# Patient Record
Sex: Male | Born: 1955 | Race: White | Hispanic: No | Marital: Married | Smoking: Never smoker
Health system: Southern US, Community
[De-identification: ages and names within clinical notes are randomized; demographics above are authoritative.]

## PROBLEM LIST (undated history)

## (undated) ENCOUNTER — Ambulatory Visit

## (undated) DIAGNOSIS — I10 Essential (primary) hypertension: Secondary | ICD-10-CM

## (undated) DIAGNOSIS — M109 Gout, unspecified: Secondary | ICD-10-CM

## (undated) DIAGNOSIS — Z944 Liver transplant status: Secondary | ICD-10-CM

## (undated) DIAGNOSIS — M1990 Unspecified osteoarthritis, unspecified site: Secondary | ICD-10-CM

## (undated) DIAGNOSIS — J189 Pneumonia, unspecified organism: Secondary | ICD-10-CM

## (undated) DIAGNOSIS — Z9289 Personal history of other medical treatment: Secondary | ICD-10-CM

## (undated) DIAGNOSIS — R634 Abnormal weight loss: Secondary | ICD-10-CM

## (undated) DIAGNOSIS — Z951 Presence of aortocoronary bypass graft: Secondary | ICD-10-CM

## (undated) HISTORY — PX: LIVER TRANSPLANT: SHX410

## (undated) HISTORY — PX: KNEE SURGERY: SHX244

## (undated) HISTORY — PX: BACK SURGERY: SHX140

---

## 2000-01-10 ENCOUNTER — Encounter: Admission: RE | Admit: 2000-01-10 | Discharge: 2000-01-10 | Payer: Self-pay | Admitting: Family Medicine

## 2000-01-10 ENCOUNTER — Encounter: Payer: Self-pay | Admitting: Family Medicine

## 2000-12-27 ENCOUNTER — Emergency Department (HOSPITAL_COMMUNITY): Admission: EM | Admit: 2000-12-27 | Discharge: 2000-12-28 | Payer: Self-pay

## 2001-05-31 ENCOUNTER — Ambulatory Visit (HOSPITAL_BASED_OUTPATIENT_CLINIC_OR_DEPARTMENT_OTHER): Admission: RE | Admit: 2001-05-31 | Discharge: 2001-05-31 | Payer: Self-pay | Admitting: Family Medicine

## 2001-05-31 ENCOUNTER — Encounter: Payer: Self-pay | Admitting: Family Medicine

## 2001-05-31 ENCOUNTER — Encounter: Admission: RE | Admit: 2001-05-31 | Discharge: 2001-05-31 | Payer: Self-pay | Admitting: Family Medicine

## 2004-08-26 ENCOUNTER — Encounter: Admission: RE | Admit: 2004-08-26 | Discharge: 2004-11-24 | Payer: Self-pay | Admitting: Family Medicine

## 2005-01-02 ENCOUNTER — Encounter: Admission: RE | Admit: 2005-01-02 | Discharge: 2005-01-02 | Payer: Self-pay | Admitting: Family Medicine

## 2005-08-04 ENCOUNTER — Encounter: Admission: RE | Admit: 2005-08-04 | Discharge: 2005-08-04 | Payer: Self-pay | Admitting: Family Medicine

## 2006-09-21 ENCOUNTER — Encounter: Admission: RE | Admit: 2006-09-21 | Discharge: 2006-09-21 | Payer: Self-pay | Admitting: Family Medicine

## 2008-01-26 ENCOUNTER — Encounter: Admission: RE | Admit: 2008-01-26 | Discharge: 2008-01-26 | Payer: Self-pay | Admitting: Family Medicine

## 2009-05-12 DIAGNOSIS — R634 Abnormal weight loss: Secondary | ICD-10-CM

## 2009-05-12 HISTORY — DX: Abnormal weight loss: R63.4

## 2011-10-26 ENCOUNTER — Emergency Department (INDEPENDENT_AMBULATORY_CARE_PROVIDER_SITE_OTHER)
Admission: EM | Admit: 2011-10-26 | Discharge: 2011-10-26 | Disposition: A | Discharge status: Home / Self Care | Payer: 59 | Source: Home / Self Care | Attending: Emergency Medicine | Admitting: Emergency Medicine

## 2011-10-26 ENCOUNTER — Encounter (HOSPITAL_COMMUNITY): Payer: Self-pay

## 2011-10-26 ENCOUNTER — Emergency Department (HOSPITAL_COMMUNITY)
Admission: EM | Admit: 2011-10-26 | Discharge: 2011-10-26 | Disposition: A | Discharge status: Home / Self Care | Payer: 59 | Attending: Emergency Medicine | Admitting: Emergency Medicine

## 2011-10-26 ENCOUNTER — Emergency Department (HOSPITAL_COMMUNITY): Payer: 59

## 2011-10-26 ENCOUNTER — Encounter (HOSPITAL_COMMUNITY): Payer: Self-pay | Admitting: Adult Health

## 2011-10-26 DIAGNOSIS — R51 Headache: Secondary | ICD-10-CM

## 2011-10-26 DIAGNOSIS — E119 Type 2 diabetes mellitus without complications: Secondary | ICD-10-CM | POA: Insufficient documentation

## 2011-10-26 DIAGNOSIS — J3489 Other specified disorders of nose and nasal sinuses: Secondary | ICD-10-CM | POA: Insufficient documentation

## 2011-10-26 DIAGNOSIS — I10 Essential (primary) hypertension: Secondary | ICD-10-CM | POA: Insufficient documentation

## 2011-10-26 HISTORY — DX: Essential (primary) hypertension: I10

## 2011-10-26 HISTORY — DX: Unspecified osteoarthritis, unspecified site: M19.90

## 2011-10-26 HISTORY — DX: Gout, unspecified: M10.9

## 2011-10-26 LAB — POCT I-STAT, CHEM 8
BUN: 7 mg/dL (ref 6–23)
Calcium, Ion: 1.16 mmol/L (ref 1.12–1.32)
Chloride: 101 mEq/L (ref 96–112)
Creatinine, Ser: 0.8 mg/dL (ref 0.50–1.35)
Glucose, Bld: 116 mg/dL — ABNORMAL HIGH (ref 70–99)
HCT: 40 % (ref 39.0–52.0)
Hemoglobin: 13.6 g/dL (ref 13.0–17.0)
Potassium: 3.4 mEq/L — ABNORMAL LOW (ref 3.5–5.1)
Sodium: 140 mEq/L (ref 135–145)
TCO2: 25 mmol/L (ref 0–100)

## 2011-10-26 MED ORDER — SODIUM CHLORIDE 0.9 % IV BOLUS (SEPSIS)
1000.0000 mL | Freq: Once | INTRAVENOUS | Status: AC
  Administered 2011-10-26: 1000 mL via INTRAVENOUS

## 2011-10-26 MED ORDER — ONDANSETRON HCL 8 MG PO TABS: 8.0000 mg | ORAL_TABLET | ORAL | Status: AC | PRN

## 2011-10-26 MED ORDER — OXYCODONE-ACETAMINOPHEN 5-325 MG PO TABS: 1.0000 | ORAL_TABLET | Freq: Four times a day (QID) | ORAL | Status: AC | PRN

## 2011-10-26 MED ORDER — KETOROLAC TROMETHAMINE 30 MG/ML IJ SOLN
30.0000 mg | Freq: Once | INTRAMUSCULAR | Status: AC
  Administered 2011-10-26: 30 mg via INTRAVENOUS
  Filled 2011-10-26: qty 1

## 2011-10-26 MED ORDER — METOCLOPRAMIDE HCL 5 MG/ML IJ SOLN
10.0000 mg | Freq: Once | INTRAMUSCULAR | Status: AC
  Administered 2011-10-26: 10 mg via INTRAVENOUS
  Filled 2011-10-26: qty 2

## 2011-10-26 MED ORDER — DIPHENHYDRAMINE HCL 50 MG/ML IJ SOLN
25.0000 mg | Freq: Once | INTRAMUSCULAR | Status: AC
  Administered 2011-10-26: 25 mg via INTRAVENOUS
  Filled 2011-10-26: qty 1

## 2011-10-26 NOTE — ED Notes (Signed)
Reports sudden onset h/a to left side forehead at 1300. "Worst h/a of my life". States does not normally get h/a or migraines. +photophobia, nausea, no emesis. Also c/o hot & cold chills

## 2011-10-26 NOTE — Discharge Instructions (Signed)
Increase fluids. Medication for nausea and pain. Rest. Follow up with Dr.

## 2011-10-26 NOTE — ED Notes (Addendum)
Pt has lt sided frontal headache that started two hours ago while watching tv, he has had sinus symptoms this week and woke this am feeling fine.  States this is the worst headache he has ever had.  Denies n/v and no changes in loc

## 2011-10-26 NOTE — ED Provider Notes (Signed)
History     CSN: [id number]  Arrival date & time 10/26/11  64   First MD Initiated Contact with Patient 10/26/11 1628      Chief Complaint  Patient presents with  . Headache    (Consider location/radiation/quality/duration/timing/severity/associated sxs/prior treatment) Patient is a 56 y.o. male presenting with headaches. The history is provided by the patient. No language interpreter was used.  Headache The primary symptoms include headaches. The symptoms began 1 to 2 hours ago. The symptoms are worsening.  Pt complains of having a headache start about 2 hours ago.  Pt has not had previous headaches.  Pt reports he has never had a headache like this.   Pt has a history of hypertension.    Past Medical History  Diagnosis Date  . Hypertension   . Diabetes mellitus   . Arthritis   . Gout     History reviewed. No pertinent past surgical history.  History reviewed. No pertinent family history.  History  Substance Use Topics  . Smoking status: Never Smoker   . Smokeless tobacco: Not on file  . Alcohol Use: No      Review of Systems  Neurological: Positive for headaches.  All other systems reviewed and are negative.    Allergies  Review of patient's allergies indicates no known allergies.  Home Medications   Current Outpatient Rx  Name Route Sig Dispense Refill  . ALLOPURINOL 100 MG PO TABS Oral Take 100 mg by mouth daily.    Marland Kitchen AMLODIPINE BESY-BENAZEPRIL HCL 10-20 MG PO CAPS Oral Take 1 capsule by mouth daily.    . ASPIRIN 81 MG PO TABS Oral Take 81 mg by mouth daily.    . OMEGA-3 FATTY ACIDS 1000 MG PO CAPS Oral Take 2 g by mouth daily.    Marland Kitchen GEMFIBROZIL 600 MG PO TABS Oral Take 600 mg by mouth 2 (two) times daily before a meal.    . HYDROCHLOROTHIAZIDE 25 MG PO TABS Oral Take 25 mg by mouth daily.    Marland Kitchen METFORMIN HCL 500 MG PO TABS Oral Take 500 mg by mouth 2 (two) times daily with a meal.    . PHENTERMINE HCL 37.5 MG PO CAPS Oral Take 37.5 mg by mouth 2 (two)  times daily.    . SERTRALINE HCL 100 MG PO TABS Oral Take 100 mg by mouth daily.    . TESTOSTERONE AQUEOUS IM Intramuscular Inject into the muscle. Started injections last week and takes twice weekly    . TRAZODONE HCL ER PO Oral Take by mouth.      BP 168/91  Pulse 86  Temp 98.4 F (36.9 C) (Oral)  Resp 18  SpO2 97%  Physical Exam  Nursing note and vitals reviewed. Constitutional: He is oriented to person, place, and time. He appears well-developed and well-nourished.  HENT:  Head: Atraumatic.  Mouth/Throat: Oropharynx is clear and moist.  Eyes: Conjunctivae are normal. Pupils are equal, round, and reactive to light.  Neck: Normal range of motion. Neck supple.  Cardiovascular: Normal rate and normal heart sounds.   Abdominal: Soft.  Musculoskeletal: Normal range of motion.  Neurological: He is alert and oriented to person, place, and time. He has normal reflexes.  Skin: Skin is warm.  Psychiatric: He has a normal mood and affect.    ED Course  Procedures (including critical care time)  Labs Reviewed - No data to display No results found.   1. Headache       MDM  Pt advised  of need to go to ED.          [place], [place] 10/26/11 [phone]

## 2011-10-26 NOTE — ED Provider Notes (Signed)
History     CSN: [id number]  Arrival date & time 10/26/11  1648   First MD Initiated Contact with Patient 10/26/11 1715      Chief Complaint  Patient presents with  . Headache    (Consider location/radiation/quality/duration/timing/severity/associated sxs/prior treatment) HPI..... left frontal headache for several hours. Tylenol did not help. Patient typically does not have severe headaches.  No fever, chills, stiff neck, neuro deficits, photophobia.  Nothing makes symptoms better or worse.  Past Medical History  Diagnosis Date  . Hypertension   . Diabetes mellitus   . Arthritis   . Gout     History reviewed. No pertinent past surgical history.  History reviewed. No pertinent family history.  History  Substance Use Topics  . Smoking status: Never Smoker   . Smokeless tobacco: Not on file  . Alcohol Use: No      Review of Systems  Allergies  Niacin and related  Home Medications   Current Outpatient Rx  Name Route Sig Dispense Refill  . ALLOPURINOL 100 MG PO TABS Oral Take 100 mg by mouth daily.    Marland Kitchen AMLODIPINE BESY-BENAZEPRIL HCL 10-20 MG PO CAPS Oral Take 1 capsule by mouth daily.    . ASPIRIN 81 MG PO TABS Oral Take 81 mg by mouth daily.    . OMEGA-3 FATTY ACIDS 1000 MG PO CAPS Oral Take 2 g by mouth daily.    Marland Kitchen GEMFIBROZIL 600 MG PO TABS Oral Take 600 mg by mouth 2 (two) times daily before a meal.    . HYDROCHLOROTHIAZIDE 25 MG PO TABS Oral Take 25 mg by mouth daily.    Marland Kitchen METFORMIN HCL 500 MG PO TABS Oral Take 500 mg by mouth 2 (two) times daily with a meal.    . PHENTERMINE HCL 37.5 MG PO CAPS Oral Take 37.5 mg by mouth 2 (two) times daily.    . SERTRALINE HCL 100 MG PO TABS Oral Take 100 mg by mouth daily.    . TESTOSTERONE AQUEOUS IM Intramuscular Inject into the muscle. Started injections last week and takes twice weekly    . ONDANSETRON HCL 8 MG PO TABS Oral Take 1 tablet (8 mg total) by mouth every 4 (four) hours as needed for nausea. 6 tablet 0  .  OXYCODONE-ACETAMINOPHEN 5-325 MG PO TABS Oral Take 1-2 tablets by mouth every 6 (six) hours as needed for pain. 15 tablet 0    BP 133/74  Pulse 67  Temp 98.4 F (36.9 C) (Oral)  Resp 18  SpO2 97%  Physical Exam  ED Course  Procedures (including critical care time)  Labs Reviewed  POCT I-STAT, CHEM 8 - Abnormal; Notable for the following:    Potassium 3.4 (*)     Glucose, Bld 116 (*)     All other components within normal limits   Ct Head Wo Contrast  10/26/2011  *RADIOLOGY REPORT*  Clinical Data: Left frontal headache since 2 hours ago. Worst headache of life.  CT HEAD WITHOUT CONTRAST  Technique:  Contiguous axial images were obtained from the base of the skull through the vertex without contrast.  Comparison: None.  Findings: Left greater than right ethmoid sinus disease is present with mucosal thickening.  Right sphenoid sinus mucous retention cyst or polyp.  Mastoid air cells clear.  No mass lesion, mass effect, midline shift, hydrocephalus, hemorrhage.  No territorial ischemia or acute infarction.  IMPRESSION:  1.  No acute intracranial abnormality. 2.  Mild mucosal paranasal sinus disease.  Original Report  Authenticated By: Dereck Ligas, M.D.     1. Headache       MDM  IV Toradol, Reglan, Benadryl given.symptoms much improved after medication. No neuro deficits at discharge.      and  Nat Christen, MD 11/05/11 1910

## 2011-10-26 NOTE — ED Notes (Signed)
Sent over by [facility] with c/o "worst headache of entire life" headache came on suddenly and is progessively getting worse denies light and sound sensitivity. Pain is located on left side.  Pain 10/10 associated with nausea, alert and oriented, PERRL

## 2011-10-27 NOTE — ED Provider Notes (Signed)
Medical screening examination/treatment/procedure(s) were performed by non-physician practitioner and as supervising physician I was immediately available for consultation/collaboration.  Cherly Beach MD   Cherly Beach, MD 10/27/11 1028

## 2012-08-15 ENCOUNTER — Emergency Department (HOSPITAL_COMMUNITY)
Admission: EM | Admit: 2012-08-15 | Discharge: 2012-08-15 | Discharge status: Absent without leave | Payer: 59 | Source: Home / Self Care | Attending: Emergency Medicine | Admitting: Emergency Medicine

## 2012-09-01 ENCOUNTER — Other Ambulatory Visit: Payer: 59

## 2012-09-01 ENCOUNTER — Other Ambulatory Visit: Payer: Self-pay | Admitting: Family Medicine

## 2012-09-01 DIAGNOSIS — R109 Unspecified abdominal pain: Secondary | ICD-10-CM

## 2012-09-03 ENCOUNTER — Ambulatory Visit
Admission: RE | Admit: 2012-09-03 | Discharge: 2012-09-03 | Disposition: A | Discharge status: Home / Self Care | Payer: 59 | Source: Ambulatory Visit | Attending: Family Medicine | Admitting: Family Medicine

## 2012-09-03 DIAGNOSIS — R109 Unspecified abdominal pain: Secondary | ICD-10-CM

## 2012-10-05 ENCOUNTER — Ambulatory Visit (INDEPENDENT_AMBULATORY_CARE_PROVIDER_SITE_OTHER): Payer: 59 | Admitting: General Surgery

## 2012-10-05 ENCOUNTER — Encounter (INDEPENDENT_AMBULATORY_CARE_PROVIDER_SITE_OTHER): Payer: Self-pay | Admitting: General Surgery

## 2012-10-05 VITALS — BP 160/70 | HR 84 | Resp 16 | Ht 72.0 in | Wt 272.6 lb

## 2012-10-05 DIAGNOSIS — K828 Other specified diseases of gallbladder: Secondary | ICD-10-CM | POA: Insufficient documentation

## 2012-10-05 NOTE — Progress Notes (Signed)
Subjective:     Patient ID: Randall Frazier, male   DOB: [birth year], 57 y.o.   MRN: [id number]  HPI We are asked to see the patient in consultation by Dr. Doran Stabler to evaluate him for gallstones. The patient is a 57 year old white male who recently had an episode of right upper quadrant pain. He describes the pain as severe. The pain lasted for 4-5 days. The pain was not associated with nausea and vomiting. He had a similar pain to this back in 2004. He underwent an ultrasound which did show sludge in the gallbladder. By report he also had a HIDA scan done which showed a low ejection fraction.  Review of Systems  Constitutional: Negative.   HENT: Negative.   Eyes: Negative.   Respiratory: Negative.   Cardiovascular: Negative.   Gastrointestinal: Positive for abdominal pain.  Endocrine: Negative.   Genitourinary: Negative.   Musculoskeletal: Negative.   Skin: Negative.   Allergic/Immunologic: Negative.   Neurological: Negative.   Hematological: Negative.   Psychiatric/Behavioral: Negative.        Objective:   Physical Exam  Constitutional: He is oriented to person, place, and time. He appears well-developed and well-nourished.  HENT:  Head: Normocephalic and atraumatic.  Eyes: Conjunctivae and EOM are normal. Pupils are equal, round, and reactive to light.  Neck: Normal range of motion. Neck supple.  Cardiovascular: Normal rate, regular rhythm and normal heart sounds.   Pulmonary/Chest: Effort normal and breath sounds normal.  Abdominal: Soft. Bowel sounds are normal. He exhibits no mass. There is no tenderness.  Musculoskeletal: Normal range of motion.  Neurological: He is alert and oriented to person, place, and time.  Skin: Skin is warm and dry.  Psychiatric: He has a normal mood and affect. His behavior is normal.       Assessment:     The patient appears to have biliary colic associated with dyskinesia. Because of the risk of further painful episodes and possible  pancreatitis I think he would benefit from having his gallbladder removed. He would also like to have this done. I've discussed with him in detail the risks and benefits of the operation to remove the gallbladder as well as some of the technical aspects including the risk of injury to the common bile duct and he understands and wishes to proceed     Plan:     Plan for laparoscopic cholecystectomy with intraoperative cholangiogram

## 2012-10-05 NOTE — Patient Instructions (Signed)
Plan for laparoscopic cholecystectomy with intraoperative cholangiogram

## 2012-11-26 ENCOUNTER — Encounter (HOSPITAL_COMMUNITY): Admission: RE | Payer: Self-pay | Source: Ambulatory Visit

## 2012-11-26 ENCOUNTER — Encounter (HOSPITAL_COMMUNITY): Admission: RE | Admit: 2012-11-26 | Payer: 59 | Source: Ambulatory Visit

## 2012-11-26 SURGERY — LAPAROSCOPIC CHOLECYSTECTOMY WITH INTRAOPERATIVE CHOLANGIOGRAM
Anesthesia: General

## 2012-11-29 ENCOUNTER — Encounter (INDEPENDENT_AMBULATORY_CARE_PROVIDER_SITE_OTHER): Payer: 59 | Admitting: General Surgery

## 2012-12-03 ENCOUNTER — Other Ambulatory Visit (HOSPITAL_COMMUNITY): Payer: Self-pay | Admitting: Orthopaedic Surgery

## 2012-12-03 ENCOUNTER — Ambulatory Visit (HOSPITAL_COMMUNITY): Admission: RE | Admit: 2012-12-03 | Payer: 59 | Source: Ambulatory Visit | Admitting: General Surgery

## 2012-12-10 ENCOUNTER — Encounter (HOSPITAL_COMMUNITY): Payer: Self-pay

## 2012-12-13 ENCOUNTER — Encounter (INDEPENDENT_AMBULATORY_CARE_PROVIDER_SITE_OTHER): Payer: 59 | Admitting: General Surgery

## 2012-12-15 ENCOUNTER — Telehealth (INDEPENDENT_AMBULATORY_CARE_PROVIDER_SITE_OTHER): Payer: Self-pay | Admitting: General Surgery

## 2012-12-15 NOTE — Telephone Encounter (Signed)
Called patient about surgery, patient advised having surgery 11 August, 2014 and will call back after recovery of 6-8 weeks to see about surgery for gallbladder.

## 2012-12-16 ENCOUNTER — Encounter (HOSPITAL_COMMUNITY)
Admission: RE | Admit: 2012-12-16 | Discharge: 2012-12-16 | Disposition: A | Discharge status: Home / Self Care | Payer: 59 | Source: Ambulatory Visit | Attending: Orthopaedic Surgery | Admitting: Orthopaedic Surgery

## 2012-12-16 ENCOUNTER — Encounter (HOSPITAL_COMMUNITY): Payer: Self-pay

## 2012-12-16 ENCOUNTER — Ambulatory Visit (HOSPITAL_COMMUNITY)
Admission: RE | Admit: 2012-12-16 | Discharge: 2012-12-16 | Disposition: A | Discharge status: Home / Self Care | Payer: 59 | Source: Ambulatory Visit | Attending: Orthopaedic Surgery | Admitting: Orthopaedic Surgery

## 2012-12-16 DIAGNOSIS — I10 Essential (primary) hypertension: Secondary | ICD-10-CM | POA: Insufficient documentation

## 2012-12-16 DIAGNOSIS — Z01818 Encounter for other preprocedural examination: Secondary | ICD-10-CM | POA: Insufficient documentation

## 2012-12-16 DIAGNOSIS — Z01810 Encounter for preprocedural cardiovascular examination: Secondary | ICD-10-CM | POA: Insufficient documentation

## 2012-12-16 DIAGNOSIS — Z01812 Encounter for preprocedural laboratory examination: Secondary | ICD-10-CM | POA: Insufficient documentation

## 2012-12-16 DIAGNOSIS — M47812 Spondylosis without myelopathy or radiculopathy, cervical region: Secondary | ICD-10-CM | POA: Insufficient documentation

## 2012-12-16 HISTORY — DX: Personal history of other medical treatment: Z92.89

## 2012-12-16 HISTORY — DX: Abnormal weight loss: R63.4

## 2012-12-16 HISTORY — DX: Pneumonia, unspecified organism: J18.9

## 2012-12-16 LAB — COMPREHENSIVE METABOLIC PANEL
ALT: 15 U/L (ref 0–53)
AST: 29 U/L (ref 0–37)
Albumin: 4 g/dL (ref 3.5–5.2)
Alkaline Phosphatase: 52 U/L (ref 39–117)
BUN: 17 mg/dL (ref 6–23)
CO2: 28 mEq/L (ref 19–32)
Calcium: 9.4 mg/dL (ref 8.4–10.5)
Chloride: 99 mEq/L (ref 96–112)
Creatinine, Ser: 1.05 mg/dL (ref 0.50–1.35)
GFR calc Af Amer: 89 mL/min — ABNORMAL LOW (ref >90)
GFR calc non Af Amer: 77 mL/min — ABNORMAL LOW (ref >90)
Glucose, Bld: 108 mg/dL — ABNORMAL HIGH (ref 70–99)
Potassium: 3.9 mEq/L (ref 3.5–5.1)
Sodium: 138 mEq/L (ref 135–145)
Total Bilirubin: 0.7 mg/dL (ref 0.3–1.2)
Total Protein: 7.6 g/dL (ref 6.0–8.3)

## 2012-12-16 LAB — CBC
HCT: 45.9 % (ref 39.0–52.0)
Hemoglobin: 16.6 g/dL (ref 13.0–17.0)
MCH: 31.4 pg (ref 26.0–34.0)
MCHC: 36.2 g/dL — ABNORMAL HIGH (ref 30.0–36.0)
MCV: 86.8 fL (ref 78.0–100.0)
Platelets: 130 10*3/uL — ABNORMAL LOW (ref 150–400)
RBC: 5.29 MIL/uL (ref 4.22–5.81)
RDW: 13.1 % (ref 11.5–15.5)
WBC: 6.7 10*3/uL (ref 4.0–10.5)

## 2012-12-16 LAB — URINALYSIS, ROUTINE W REFLEX MICROSCOPIC
Bilirubin Urine: NEGATIVE
Glucose, UA: NEGATIVE mg/dL
Hgb urine dipstick: NEGATIVE
Ketones, ur: NEGATIVE mg/dL
Leukocytes, UA: NEGATIVE
Nitrite: NEGATIVE
Protein, ur: NEGATIVE mg/dL
Specific Gravity, Urine: 1.022 (ref 1.005–1.030)
Urobilinogen, UA: 1 mg/dL (ref 0.0–1.0)
pH: 6 (ref 5.0–8.0)

## 2012-12-16 LAB — SURGICAL PCR SCREEN
MRSA, PCR: NEGATIVE
Staphylococcus aureus: POSITIVE — AB

## 2012-12-16 NOTE — Pre-Procedure Instructions (Signed)
DAHMIR EPPERLY  12/16/2012   Your procedure is scheduled on:  12/20/2012  Report to [facility] at 10:30 AM.  Call this number if you have problems the morning of surgery: [phone]   Remember:   Do not eat food or drink liquids after midnight. On Sunday    Take these medicines the morning of surgery with A SIP OF WATER: amlodipine    Do not wear jewelry  Do not wear lotions, powders, or perfumes. You may wear deodorant.            Men may shave face and neck.  Do not bring valuables to the hospital.  [facility] is not responsible   for any belongings or valuables.  Contacts, dentures or bridgework may not be worn into surgery.  Leave suitcase in the car. After surgery it may be brought to your room.  For patients admitted to the hospital, checkout time is 11:00 AM the day of  discharge.   Patients discharged the day of surgery will not be allowed to drive  home.  Name and phone number of your driver: with wife  Special Instructions: Shower using CHG 2 nights before surgery and the night before surgery.  If you shower the day of surgery use CHG.  Use special wash - you have one bottle of CHG for all showers.  You should use approximately 1/3 of the bottle for each shower.   Please read over the following fact sheets that you were given: Pain Booklet, Coughing and Deep Breathing, MRSA Information and Surgical Site Infection Prevention

## 2012-12-16 NOTE — Progress Notes (Signed)
Spoke /w Amy at Dr. Lorin Mercy office, informed them that an Rx needs to be called in due to pt. + nasal swab for staph.

## 2012-12-16 NOTE — Progress Notes (Signed)
Unsure when he had EKG last, sees Dr. Marisue Humble for 14 yrs., had stress test 10 + yrs. Ago, never seen by cardiology

## 2012-12-16 NOTE — Progress Notes (Signed)
12/16/12 1603  OBSTRUCTIVE SLEEP APNEA  Have you ever been diagnosed with sleep apnea through a sleep study? No  Do you snore loudly (loud enough to be heard through closed doors)?  0  Do you often feel tired, fatigued, or sleepy during the daytime? 0  Has anyone observed you stop breathing during your sleep? 0  Do you have, or are you being treated for high blood pressure? 1  BMI more than 35 kg/m2? 1  Age over 58 years old? 1  Neck circumference greater than 40 cm/18 inches? 1  Gender: 1  Obstructive Sleep Apnea Score 5  Score 4 or greater  Results sent to PCP

## 2012-12-17 NOTE — H&P (Signed)
[facility]   A Division of [employer]   [address] [place], [place] [address] Telephone: [phone]  Fax: [phone]     PATIENT: Randall Frazier, Randall Frazier   MR#: [id number]  DOB: 29-Mar-1956       A 57 year old male with persistent pain in his neck and into his shoulders.  He has been through conservative treatment with persistent hand numbness and pain for greater than 4 months.  He denies any symptoms in his legs.  He states the pain wakes him up at night when he turns his head.  Pain radiates into his shoulder.  He has been through physical therapy for 2 months, treated with cervical traction, home exercise program, electric stim, postural awareness, strengthening exercises without relief.  He did notice slight improvement in the ability to sleep and not wake up as often after his exercise program.  The patient has taken anti-inflammatories without relief.  He is on metformin, and prednisone pack was given in April, which gave him only temporary relief while he was taking the prednisone.  He has continued to take his allopurinol and Uloric for his history of gout.      MRI scan, 10/26/12, showed cervical spondylitic changes with most significant disease at the C5-6 and C6-7 levels.  The patient has disk osteophyte complex at C5-6 with narrowing of the ventral sac with normal cord signal.  There is moderately severe foraminal stenosis, which is worse on the left, consistent with his symptoms.  Disk osteophyte complex at C6-7 as well with moderate to severe biforaminal narrowing noted.  Mild bulge at C7-T1 and milder changes at C4-5 with foraminal narrowing rated mild to moderate.     The patient was originally referred by Dr. Fredna Dow based on EMG/nerve conduction velocity showing no evidence of peripheral nerve entrapment, upper extremity and cervical spine x-ray showing significant spondylitic changes in the cervical spine with uncovertebral changes, facet  degenerative changes, disk space narrowing, loss of disk space height worse at C6-7 than C5-6 and anterior posterior disk osteophyte complex with spurring.  There was no evidence of congenital bars.      The patient states this has bothered him on a daily basis.  It has been going on for more than 4 months.  Central canal is narrowed down to 9 mm at the C5-6 level with disk osteophyte complex and he has biforaminal stenosis worse on the left than right at C5-6 and severe biforaminal narrowing at C6-7, both right and left. Surgical option would be 2-level cervical fusion.  Overnight stay in the hospital.  Allograft and plate.  Use of the operative microscope was discussed.  Risks of surgery included dysphagia, dysphonia, risk for nonunion, which is less than 10%.  We discussed that if he had a symptomatic pseudoarthrosis, posterior cervical fusion might be needed if he had persistent problems.   Use of postoperative collar, healing time of 6 weeks was discussed.  All questions answered.  He understands and will call if he would like to proceed.    CURRENT MEDICATIONS:  Include metformin, allopurinol, gemfibrozil, vitamin D, and vitamin E.    ALLERGIES:  He is allergic to niacin.     PAST MEDICAL/SURGICAL HISTORY:  Positive for hypertension.  He reported no previous surgeries.  Primary care physician is Dr. Marisue Humble.     SOCIAL HISTORY:  He is married to his wife, Arbie Cookey.  He works as a Art gallery manager.  He does not smoke, rarely drinks.  REVIEW OF SYSTEMS:  Positive for arthritis, diabetes, hypertension and pneumonia.   PHYSICAL EXAMINATION:  The patient is 6-feet tall, 268 pounds.  Alert and oriented.  There is a positive Spurling more on the left than right.  Upper extremity reflexes are 2+.  Biceps, triceps are strong.  There is dorsal hand numbness in the C6 distribution.  Carpal canal with Phalen and Tinel is negative.  Interossei are normal.  No wasting.  Positive Spurling worse on the left than  right.  Distal pedal pulses are normal.  Normal gait.  No lower extremity hyperreflexia.  Lungs are clear to auscultation.  Heart regular rate and rhythm.  Liver and spleen are not palpably enlarged.     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     PLAN:  Anterior cervical discectomy and fusion at C5-6 and C6-7 with allograft,plate and screws.     For additional information please see handwritten notes, reports, orders and prescriptions in this chart.      Mark C. Lorin Mercy, M.D.    Auto-Authenticated by Thana Farr. Lorin Mercy, M.D.

## 2012-12-19 MED ORDER — CHLORHEXIDINE GLUCONATE 4 % EX LIQD: 1.0000 "application " | Freq: Once | CUTANEOUS | Status: DC

## 2012-12-19 MED ORDER — DEXTROSE 5 % IV SOLN
3.0000 g | INTRAVENOUS | Status: AC
  Administered 2012-12-20: 3 g via INTRAVENOUS
  Filled 2012-12-19: qty 3000

## 2012-12-20 ENCOUNTER — Encounter (HOSPITAL_COMMUNITY)
Admission: RE | Disposition: A | Discharge status: Home / Self Care | Payer: Self-pay | Source: Ambulatory Visit | Attending: Orthopaedic Surgery

## 2012-12-20 ENCOUNTER — Ambulatory Visit (HOSPITAL_COMMUNITY): Payer: 59 | Admitting: Certified Registered Nurse Anesthetist

## 2012-12-20 ENCOUNTER — Encounter (HOSPITAL_COMMUNITY): Payer: Self-pay | Admitting: Certified Registered Nurse Anesthetist

## 2012-12-20 ENCOUNTER — Inpatient Hospital Stay (HOSPITAL_COMMUNITY)
Admission: RE | Admit: 2012-12-20 | Discharge: 2012-12-21 | DRG: 473 | Disposition: A | Discharge status: Home / Self Care | Payer: 59 | Source: Ambulatory Visit | Attending: Orthopaedic Surgery | Admitting: Orthopaedic Surgery

## 2012-12-20 ENCOUNTER — Ambulatory Visit (HOSPITAL_COMMUNITY): Payer: 59

## 2012-12-20 ENCOUNTER — Encounter (HOSPITAL_COMMUNITY): Payer: Self-pay | Admitting: *Deleted

## 2012-12-20 DIAGNOSIS — Z79899 Other long term (current) drug therapy: Secondary | ICD-10-CM

## 2012-12-20 DIAGNOSIS — E119 Type 2 diabetes mellitus without complications: Secondary | ICD-10-CM | POA: Diagnosis present

## 2012-12-20 DIAGNOSIS — I10 Essential (primary) hypertension: Secondary | ICD-10-CM | POA: Diagnosis present

## 2012-12-20 DIAGNOSIS — Z7982 Long term (current) use of aspirin: Secondary | ICD-10-CM

## 2012-12-20 DIAGNOSIS — M5380 Other specified dorsopathies, site unspecified: Secondary | ICD-10-CM | POA: Diagnosis present

## 2012-12-20 DIAGNOSIS — M109 Gout, unspecified: Secondary | ICD-10-CM | POA: Diagnosis present

## 2012-12-20 DIAGNOSIS — M47812 Spondylosis without myelopathy or radiculopathy, cervical region: Principal | ICD-10-CM | POA: Diagnosis present

## 2012-12-20 HISTORY — PX: ANTERIOR CERVICAL DECOMP/DISCECTOMY FUSION: SHX1161

## 2012-12-20 LAB — GLUCOSE, CAPILLARY
Glucose-Capillary: 111 mg/dL — ABNORMAL HIGH (ref 70–99)
Glucose-Capillary: 161 mg/dL — ABNORMAL HIGH (ref 70–99)
Glucose-Capillary: 236 mg/dL — ABNORMAL HIGH (ref 70–99)

## 2012-12-20 SURGERY — ANTERIOR CERVICAL DECOMPRESSION/DISCECTOMY FUSION 2 LEVELS
Anesthesia: General | Site: Spine Cervical | Wound class: Clean

## 2012-12-20 MED ORDER — METFORMIN HCL 500 MG PO TABS
500.0000 mg | ORAL_TABLET | Freq: Every day | ORAL | Status: DC
  Administered 2012-12-21: 500 mg via ORAL
  Filled 2012-12-20 (×2): qty 1

## 2012-12-20 MED ORDER — PANTOPRAZOLE SODIUM 40 MG IV SOLR
40.0000 mg | Freq: Every day | INTRAVENOUS | Status: DC
  Administered 2012-12-20: 40 mg via INTRAVENOUS
  Filled 2012-12-20 (×2): qty 40

## 2012-12-20 MED ORDER — AMLODIPINE BESYLATE 5 MG PO TABS
5.0000 mg | ORAL_TABLET | Freq: Every day | ORAL | Status: DC
  Administered 2012-12-21: 5 mg via ORAL
  Filled 2012-12-20 (×2): qty 1

## 2012-12-20 MED ORDER — DOCUSATE SODIUM 100 MG PO CAPS
100.0000 mg | ORAL_CAPSULE | Freq: Two times a day (BID) | ORAL | Status: DC
  Administered 2012-12-20 – 2012-12-21 (×2): 100 mg via ORAL
  Filled 2012-12-20 (×3): qty 1

## 2012-12-20 MED ORDER — OXYCODONE-ACETAMINOPHEN 5-325 MG PO TABS
1.0000 | ORAL_TABLET | ORAL | Status: DC | PRN
  Administered 2012-12-20 – 2012-12-21 (×5): 2 via ORAL
  Filled 2012-12-20 (×4): qty 2

## 2012-12-20 MED ORDER — CEFAZOLIN SODIUM 1-5 GM-% IV SOLN
1.0000 g | Freq: Three times a day (TID) | INTRAVENOUS | Status: AC
  Administered 2012-12-20 – 2012-12-21 (×2): 1 g via INTRAVENOUS
  Filled 2012-12-20 (×2): qty 50

## 2012-12-20 MED ORDER — EPHEDRINE SULFATE 50 MG/ML IJ SOLN
INTRAMUSCULAR | Status: DC | PRN
  Administered 2012-12-20 (×2): 10 mg via INTRAVENOUS

## 2012-12-20 MED ORDER — PROPOFOL 10 MG/ML IV BOLUS
INTRAVENOUS | Status: DC | PRN
  Administered 2012-12-20: 200 mg via INTRAVENOUS

## 2012-12-20 MED ORDER — ACETAMINOPHEN 650 MG RE SUPP: 650.0000 mg | RECTAL | Status: DC | PRN

## 2012-12-20 MED ORDER — OXYCODONE-ACETAMINOPHEN 5-325 MG PO TABS: 1.0000 | ORAL_TABLET | ORAL | Status: DC | PRN

## 2012-12-20 MED ORDER — KETOROLAC TROMETHAMINE 30 MG/ML IJ SOLN
30.0000 mg | Freq: Once | INTRAMUSCULAR | Status: AC
  Administered 2012-12-20: 30 mg via INTRAVENOUS

## 2012-12-20 MED ORDER — METHOCARBAMOL 500 MG PO TABS: 500.0000 mg | ORAL_TABLET | Freq: Four times a day (QID) | ORAL | Status: DC | PRN

## 2012-12-20 MED ORDER — OXYCODONE-ACETAMINOPHEN 5-325 MG PO TABS
ORAL_TABLET | ORAL | Status: AC
  Filled 2012-12-20: qty 2

## 2012-12-20 MED ORDER — MIDAZOLAM HCL 5 MG/5ML IJ SOLN
INTRAMUSCULAR | Status: DC | PRN
  Administered 2012-12-20: 2 mg via INTRAVENOUS

## 2012-12-20 MED ORDER — ACETAMINOPHEN 325 MG PO TABS: 650.0000 mg | ORAL_TABLET | ORAL | Status: DC | PRN

## 2012-12-20 MED ORDER — FENTANYL CITRATE 0.05 MG/ML IJ SOLN
INTRAMUSCULAR | Status: DC | PRN
  Administered 2012-12-20 (×4): 50 ug via INTRAVENOUS
  Administered 2012-12-20: 100 ug via INTRAVENOUS

## 2012-12-20 MED ORDER — 0.9 % SODIUM CHLORIDE (POUR BTL) OPTIME
TOPICAL | Status: DC | PRN
  Administered 2012-12-20: 1000 mL

## 2012-12-20 MED ORDER — FLEET ENEMA 7-19 GM/118ML RE ENEM: 1.0000 | ENEMA | Freq: Once | RECTAL | Status: AC | PRN

## 2012-12-20 MED ORDER — PHENOL 1.4 % MT LIQD: 1.0000 | OROMUCOSAL | Status: DC | PRN

## 2012-12-20 MED ORDER — METHOCARBAMOL 500 MG PO TABS
ORAL_TABLET | ORAL | Status: AC
  Filled 2012-12-20: qty 1

## 2012-12-20 MED ORDER — SODIUM CHLORIDE 0.9 % IJ SOLN: 3.0000 mL | Freq: Two times a day (BID) | INTRAMUSCULAR | Status: DC

## 2012-12-20 MED ORDER — HYDROCODONE-ACETAMINOPHEN 5-325 MG PO TABS: 1.0000 | ORAL_TABLET | ORAL | Status: DC | PRN

## 2012-12-20 MED ORDER — SODIUM CHLORIDE 0.9 % IJ SOLN: 3.0000 mL | INTRAMUSCULAR | Status: DC | PRN

## 2012-12-20 MED ORDER — ONDANSETRON HCL 4 MG/2ML IJ SOLN: 4.0000 mg | INTRAMUSCULAR | Status: DC | PRN

## 2012-12-20 MED ORDER — TRAZODONE HCL 150 MG PO TABS
150.0000 mg | ORAL_TABLET | Freq: Every day | ORAL | Status: DC
  Administered 2012-12-20: 150 mg via ORAL
  Filled 2012-12-20 (×2): qty 1

## 2012-12-20 MED ORDER — LIDOCAINE HCL (CARDIAC) 20 MG/ML IV SOLN
INTRAVENOUS | Status: DC | PRN
  Administered 2012-12-20: 80 mg via INTRAVENOUS

## 2012-12-20 MED ORDER — LIDOCAINE HCL 4 % MT SOLN
OROMUCOSAL | Status: DC | PRN
  Administered 2012-12-20: 4 mL via TOPICAL

## 2012-12-20 MED ORDER — ROCURONIUM BROMIDE 100 MG/10ML IV SOLN
INTRAVENOUS | Status: DC | PRN
  Administered 2012-12-20: 50 mg via INTRAVENOUS

## 2012-12-20 MED ORDER — LACTATED RINGERS IV SOLN
INTRAVENOUS | Status: DC
  Administered 2012-12-20: 50 mL/h via INTRAVENOUS

## 2012-12-20 MED ORDER — ZOLPIDEM TARTRATE 5 MG PO TABS
5.0000 mg | ORAL_TABLET | Freq: Every evening | ORAL | Status: DC | PRN
  Administered 2012-12-20 – 2012-12-21 (×2): 5 mg via ORAL
  Filled 2012-12-20 (×2): qty 1

## 2012-12-20 MED ORDER — SENNOSIDES-DOCUSATE SODIUM 8.6-50 MG PO TABS: 1.0000 | ORAL_TABLET | Freq: Every evening | ORAL | Status: DC | PRN

## 2012-12-20 MED ORDER — HYDROMORPHONE HCL PF 1 MG/ML IJ SOLN
INTRAMUSCULAR | Status: AC
  Filled 2012-12-20: qty 2

## 2012-12-20 MED ORDER — MORPHINE SULFATE 2 MG/ML IJ SOLN
1.0000 mg | INTRAMUSCULAR | Status: DC | PRN
  Administered 2012-12-20 – 2012-12-21 (×3): 4 mg via INTRAVENOUS
  Filled 2012-12-20 (×3): qty 2

## 2012-12-20 MED ORDER — METHOCARBAMOL 100 MG/ML IJ SOLN
500.0000 mg | Freq: Four times a day (QID) | INTRAVENOUS | Status: DC | PRN
  Filled 2012-12-20: qty 5

## 2012-12-20 MED ORDER — MENTHOL 3 MG MT LOZG: 1.0000 | LOZENGE | OROMUCOSAL | Status: DC | PRN

## 2012-12-20 MED ORDER — POTASSIUM CHLORIDE IN NACL 20-0.45 MEQ/L-% IV SOLN
INTRAVENOUS | Status: DC
  Administered 2012-12-20 – 2012-12-21 (×2): via INTRAVENOUS
  Filled 2012-12-20 (×3): qty 1000

## 2012-12-20 MED ORDER — ASPIRIN EC 81 MG PO TBEC
81.0000 mg | DELAYED_RELEASE_TABLET | Freq: Every day | ORAL | Status: DC
  Administered 2012-12-20 – 2012-12-21 (×2): 81 mg via ORAL
  Filled 2012-12-20 (×3): qty 1

## 2012-12-20 MED ORDER — LACTATED RINGERS IV SOLN
INTRAVENOUS | Status: DC | PRN
  Administered 2012-12-20 (×2): via INTRAVENOUS

## 2012-12-20 MED ORDER — METHOCARBAMOL 500 MG PO TABS
500.0000 mg | ORAL_TABLET | Freq: Four times a day (QID) | ORAL | Status: DC | PRN
  Administered 2012-12-20 – 2012-12-21 (×3): 500 mg via ORAL
  Filled 2012-12-20 (×2): qty 1

## 2012-12-20 MED ORDER — KETOROLAC TROMETHAMINE 30 MG/ML IJ SOLN
INTRAMUSCULAR | Status: AC
  Administered 2012-12-20: 30 mg
  Filled 2012-12-20: qty 1

## 2012-12-20 MED ORDER — DEXAMETHASONE SODIUM PHOSPHATE 4 MG/ML IJ SOLN
INTRAMUSCULAR | Status: DC | PRN
  Administered 2012-12-20: 8 mg via INTRAVENOUS

## 2012-12-20 MED ORDER — ATORVASTATIN CALCIUM 20 MG PO TABS
20.0000 mg | ORAL_TABLET | Freq: Every day | ORAL | Status: DC
  Administered 2012-12-20: 20 mg via ORAL
  Filled 2012-12-20 (×2): qty 1

## 2012-12-20 MED ORDER — BUPIVACAINE-EPINEPHRINE 0.25% -1:200000 IJ SOLN
INTRAMUSCULAR | Status: DC | PRN
  Administered 2012-12-20: 5 mL

## 2012-12-20 MED ORDER — GLYCOPYRROLATE 0.2 MG/ML IJ SOLN
INTRAMUSCULAR | Status: DC | PRN
  Administered 2012-12-20 (×2): 0.2 mg via INTRAVENOUS

## 2012-12-20 MED ORDER — INSULIN ASPART 100 UNIT/ML ~~LOC~~ SOLN
0.0000 [IU] | Freq: Three times a day (TID) | SUBCUTANEOUS | Status: DC
  Administered 2012-12-20 – 2012-12-21 (×2): 3 [IU] via SUBCUTANEOUS

## 2012-12-20 MED ORDER — ARTIFICIAL TEARS OP OINT
TOPICAL_OINTMENT | OPHTHALMIC | Status: DC | PRN
  Administered 2012-12-20: 1 via OPHTHALMIC

## 2012-12-20 MED ORDER — ONDANSETRON HCL 4 MG/2ML IJ SOLN
INTRAMUSCULAR | Status: DC | PRN
  Administered 2012-12-20: 4 mg via INTRAVENOUS

## 2012-12-20 MED ORDER — SODIUM CHLORIDE 0.9 % IV SOLN: 250.0000 mL | INTRAVENOUS | Status: DC

## 2012-12-20 MED ORDER — ALLOPURINOL 100 MG PO TABS
100.0000 mg | ORAL_TABLET | Freq: Every day | ORAL | Status: DC
  Administered 2012-12-20: 100 mg via ORAL
  Filled 2012-12-20 (×2): qty 1

## 2012-12-20 MED ORDER — BUPIVACAINE-EPINEPHRINE PF 0.25-1:200000 % IJ SOLN
INTRAMUSCULAR | Status: AC
  Filled 2012-12-20: qty 30

## 2012-12-20 MED ORDER — BISACODYL 10 MG RE SUPP: 10.0000 mg | Freq: Every day | RECTAL | Status: DC | PRN

## 2012-12-20 MED ORDER — NEOSTIGMINE METHYLSULFATE 1 MG/ML IJ SOLN
INTRAMUSCULAR | Status: DC | PRN
  Administered 2012-12-20: 2 mg via INTRAVENOUS

## 2012-12-20 SURGICAL SUPPLY — 58 items
BENZOIN TINCTURE PRP APPL 2/3 (GAUZE/BANDAGES/DRESSINGS) ×2 IMPLANT
BIT DRILL SRG 14X2.2XFLT CHK (BIT) ×1 IMPLANT
BIT DRL SRG 14X2.2XFLT CHK (BIT) ×1
BLADE SURG ROTATE 9660 (MISCELLANEOUS) IMPLANT
BONE CERV LORDOTIC 14.5X12X7 (Bone Implant) ×4 IMPLANT
BUR ROUND FLUTED 4 SOFT TCH (BURR) ×2 IMPLANT
CLOTH BEACON ORANGE TIMEOUT ST (SAFETY) ×2 IMPLANT
CLSR STERI-STRIP ANTIMIC 1/2X4 (GAUZE/BANDAGES/DRESSINGS) ×2 IMPLANT
COLLAR CERV LO CONTOUR FIRM DE (SOFTGOODS) ×2 IMPLANT
CORDS BIPOLAR (ELECTRODE) ×2 IMPLANT
COVER SURGICAL LIGHT HANDLE (MISCELLANEOUS) ×2 IMPLANT
DERMABOND ADVANCED (GAUZE/BANDAGES/DRESSINGS) ×1
DERMABOND ADVANCED .7 DNX12 (GAUZE/BANDAGES/DRESSINGS) ×1 IMPLANT
DRAPE C-ARM 42X72 X-RAY (DRAPES) ×2 IMPLANT
DRAPE MICROSCOPE LEICA (MISCELLANEOUS) ×2 IMPLANT
DRAPE PROXIMA HALF (DRAPES) ×2 IMPLANT
DRILL BIT SKYLINE 14MM (BIT) ×1
DRSG MEPILEX BORDER 4X4 (GAUZE/BANDAGES/DRESSINGS) ×2 IMPLANT
DRSG MEPILEX BORDER 4X8 (GAUZE/BANDAGES/DRESSINGS) ×2 IMPLANT
DURAPREP 6ML APPLICATOR 50/CS (WOUND CARE) ×2 IMPLANT
ELECT COATED BLADE 2.86 ST (ELECTRODE) ×2 IMPLANT
ELECT REM PT RETURN 9FT ADLT (ELECTROSURGICAL) ×2
ELECTRODE REM PT RTRN 9FT ADLT (ELECTROSURGICAL) ×1 IMPLANT
EVACUATOR 1/8 PVC DRAIN (DRAIN) ×2 IMPLANT
GAUZE XEROFORM 1X8 LF (GAUZE/BANDAGES/DRESSINGS) ×2 IMPLANT
GLOVE BIOGEL PI IND STRL 7.5 (GLOVE) ×1 IMPLANT
GLOVE BIOGEL PI IND STRL 8 (GLOVE) ×1 IMPLANT
GLOVE BIOGEL PI INDICATOR 7.5 (GLOVE) ×1
GLOVE BIOGEL PI INDICATOR 8 (GLOVE) ×1
GLOVE ECLIPSE 7.0 STRL STRAW (GLOVE) ×2 IMPLANT
GLOVE ORTHO TXT STRL SZ7.5 (GLOVE) ×2 IMPLANT
GOWN PREVENTION PLUS LG XLONG (DISPOSABLE) ×2 IMPLANT
GOWN PREVENTION PLUS XLARGE (GOWN DISPOSABLE) ×2 IMPLANT
GOWN STRL NON-REIN LRG LVL3 (GOWN DISPOSABLE) ×4 IMPLANT
HEAD HALTER (SOFTGOODS) ×2 IMPLANT
HEMOSTAT SURGICEL 2X14 (HEMOSTASIS) IMPLANT
KIT BASIN OR (CUSTOM PROCEDURE TRAY) ×2 IMPLANT
KIT ROOM TURNOVER OR (KITS) ×2 IMPLANT
MANIFOLD NEPTUNE II (INSTRUMENTS) ×2 IMPLANT
NEEDLE 25GX 5/8IN NON SAFETY (NEEDLE) ×2 IMPLANT
NS IRRIG 1000ML POUR BTL (IV SOLUTION) ×2 IMPLANT
PACK ORTHO CERVICAL (CUSTOM PROCEDURE TRAY) ×2 IMPLANT
PAD ARMBOARD 7.5X6 YLW CONV (MISCELLANEOUS) ×4 IMPLANT
PATTIES SURGICAL .5 X.5 (GAUZE/BANDAGES/DRESSINGS) IMPLANT
PIN FIXATION SKYLINE (Pin) ×2 IMPLANT
PLATE TWO LEVEL SKYLINE 30MM (Plate) ×2 IMPLANT
SCREW VAR SELF TAP SKYLINE 14M (Screw) ×12 IMPLANT
SPONGE GAUZE 4X4 12PLY (GAUZE/BANDAGES/DRESSINGS) ×2 IMPLANT
SURGIFLO TRUKIT (HEMOSTASIS) IMPLANT
SUT VIC AB 2-0 CT1 27 (SUTURE) ×1
SUT VIC AB 2-0 CT1 TAPERPNT 27 (SUTURE) ×1 IMPLANT
SUT VIC AB 3-0 X1 27 (SUTURE) ×2 IMPLANT
SUT VICRYL 4-0 PS2 18IN ABS (SUTURE) ×4 IMPLANT
SYR 30ML SLIP (SYRINGE) ×2 IMPLANT
TAPE CLOTH SURG 4X10 WHT LF (GAUZE/BANDAGES/DRESSINGS) ×2 IMPLANT
TOWEL OR 17X24 6PK STRL BLUE (TOWEL DISPOSABLE) ×2 IMPLANT
TOWEL OR 17X26 10 PK STRL BLUE (TOWEL DISPOSABLE) ×2 IMPLANT
WATER STERILE IRR 1000ML POUR (IV SOLUTION) IMPLANT

## 2012-12-20 NOTE — Anesthesia Postprocedure Evaluation (Signed)
  Anesthesia Post-op Note  Patient: Randall Frazier  Procedure(s) Performed: Procedure(s): C5-6, C6-7 ANTERIOR CERVICAL DISCECTOMY AND FUSION, ALLOGRAFT, PLATE (N/A)  Patient Location: PACU  Anesthesia Type:General  Level of Consciousness: awake  Airway and Oxygen Therapy: Patient Spontanous Breathing  Post-op Pain: mild  Post-op Assessment: Post-op Vital signs reviewed  Post-op Vital Signs: stable  Complications: No apparent anesthesia complications

## 2012-12-20 NOTE — Transfer of Care (Signed)
Immediate Anesthesia Transfer of Care Note  Patient: Randall Frazier  Procedure(s) Performed: Procedure(s): C5-6, C6-7 ANTERIOR CERVICAL DISCECTOMY AND FUSION, ALLOGRAFT, PLATE (N/A)  Patient Location: PACU  Anesthesia Type:General  Level of Consciousness: awake, alert , oriented and patient cooperative  Airway & Oxygen Therapy: Patient Spontanous Breathing and Patient connected to face mask oxygen  Post-op Assessment: Report given to PACU RN, Post -op Vital signs reviewed and stable and Patient moving all extremities  Post vital signs: Reviewed and stable  Complications: No apparent anesthesia complications

## 2012-12-20 NOTE — Interval H&P Note (Signed)
History and Physical Interval Note:  12/20/2012 12:06 PM  Randall Frazier  has presented today for surgery, with the diagnosis of C5-6,C6-7 spondylosis  The various methods of treatment have been discussed with the patient and family. After consideration of risks, benefits and other options for treatment, the patient has consented to  Procedure(s): C5-6, C6-7 ANTERIOR CERVICAL DISCECTOMY AND FUSION, ALLOGRAFT, PLATE (N/A) as a surgical intervention .  The patient's history has been reviewed, patient examined, no change in status, stable for surgery.  I have reviewed the patient's chart and labs.  Questions were answered to the patient's satisfaction.     YATES,MARK C

## 2012-12-20 NOTE — Preoperative (Signed)
Beta Blockers   Reason not to administer Beta Blockers:Not Applicable 

## 2012-12-20 NOTE — Anesthesia Preprocedure Evaluation (Signed)
Anesthesia Evaluation  Patient identified by MRN, date of birth, ID band Patient awake    Reviewed: Allergy & Precautions, H&P , NPO status , Patient's Chart, lab work & pertinent test results, reviewed documented beta blocker date and time   Airway Mallampati: III TM Distance: >3 FB Neck ROM: Full    Dental  (+) Teeth Intact and Dental Advisory Given   Pulmonary    Pulmonary exam normal       Cardiovascular hypertension, Pt. on medications     Neuro/Psych negative neurological ROS  negative psych ROS   GI/Hepatic negative GI ROS, Neg liver ROS,   Endo/Other  diabetes, Well Controlled, Oral Hypoglycemic Agents  Renal/GU negative Renal ROS     Musculoskeletal   Abdominal (+) + obese,   Peds  Hematology negative hematology ROS (+)   Anesthesia Other Findings   Reproductive/Obstetrics                           Anesthesia Physical Anesthesia Plan  ASA: II  Anesthesia Plan: General   Post-op Pain Management:    Induction:   Airway Management Planned: Oral ETT  Additional Equipment:   Intra-op Plan:   Post-operative Plan: Extubation in OR  Informed Consent: I have reviewed the patients History and Physical, chart, labs and discussed the procedure including the risks, benefits and alternatives for the proposed anesthesia with the patient or authorized representative who has indicated his/her understanding and acceptance.   Dental advisory given  Plan Discussed with: CRNA  Anesthesia Plan Comments:         Anesthesia Quick Evaluation

## 2012-12-20 NOTE — Brief Op Note (Cosign Needed)
12/20/2012  2:58 PM  PATIENT:  Randall Frazier  57 y.o. male  PRE-OPERATIVE DIAGNOSIS:  C5-6,C6-7 spondylosis  POST-OPERATIVE DIAGNOSIS:  C5-6,C6-7 spondylosis  PROCEDURE:  Procedure(s): C5-6, C6-7 ANTERIOR CERVICAL DISCECTOMY AND FUSION, ALLOGRAFT, PLATE (N/A)  SURGEON:  Surgeon(s) and Role:    * Marybelle Killings, MD - Primary  PHYSICIAN ASSISTANT: Phillips Hay PAC  ASSISTANTS: none   ANESTHESIA:   general  EBL:  Total I/O In: 1600 [I.V.:1600] Out: 150 [Blood:150]  BLOOD ADMINISTERED:none  DRAINS: (one) Hemovact drain(s) in the anterior neck with  Suction Open   LOCAL MEDICATIONS USED:  MARCAINE     SPECIMEN:  No Specimen  DISPOSITION OF SPECIMEN:  N/A  COUNTS:  YES  TOURNIQUET:  * No tourniquets in log *  DICTATION: .Note written in EPIC  PLAN OF CARE: Admit to inpatient   PATIENT DISPOSITION:  PACU - hemodynamically stable.   Delay start of Pharmacological VTE agent (>24hrs) due to surgical blood loss or risk of bleeding: yes

## 2012-12-21 ENCOUNTER — Encounter (HOSPITAL_COMMUNITY): Payer: Self-pay | Admitting: Orthopaedic Surgery

## 2012-12-21 ENCOUNTER — Encounter (INDEPENDENT_AMBULATORY_CARE_PROVIDER_SITE_OTHER): Payer: 59 | Admitting: General Surgery

## 2012-12-21 LAB — GLUCOSE, CAPILLARY: Glucose-Capillary: 155 mg/dL — ABNORMAL HIGH (ref 70–99)

## 2012-12-21 NOTE — Progress Notes (Signed)
Patient discharged to home accompanied by wife. Discharge instructions and rx given and explained and pt stated understanding. IV was removed and patient left unit in a stable condition via wheelchair.

## 2012-12-21 NOTE — Progress Notes (Signed)
Subjective: 1 Day Post-Op Procedure(s) (LRB): C5-6, C6-7 ANTERIOR CERVICAL DISCECTOMY AND FUSION, ALLOGRAFT, PLATE (N/A) Patient reports pain as mild.    Objective: Vital signs in last 24 hours: Temp:  [97 F (36.1 C)-98.9 F (37.2 C)] 97.3 F (36.3 C) (08/12 0546) Pulse Rate:  [72-105] 98 (08/12 0546) Resp:  [0-20] 18 (08/12 0546) BP: (153-186)/(82-99) 153/88 mmHg (08/12 0546) SpO2:  [91 %-98 %] 98 % (08/12 0546)  Intake/Output from previous day: 08/11 0701 - 08/12 0700 In: 1800 [I.V.:1800] Out: 890 [Drains:40; Blood:150] Intake/Output this shift:    No results found for this basename: HGB,  in the last 72 hours No results found for this basename: WBC, RBC, HCT, PLT,  in the last 72 hours No results found for this basename: NA, K, CL, CO2, BUN, CREATININE, GLUCOSE, CALCIUM,  in the last 72 hours No results found for this basename: LABPT, INR,  in the last 72 hours  Neurovascular intact Incision: drain removed  Assessment/Plan: 1 Day Post-Op Procedure(s) (LRB): C5-6, C6-7 ANTERIOR CERVICAL DISCECTOMY AND FUSION, ALLOGRAFT, PLATE (N/A) D/C IV fluids Discharge home today rx percocet and robaxin on chart. OV 2 weeks  Tangi Shroff M 12/21/2012, 8:42 AM

## 2012-12-21 NOTE — Discharge Summary (Signed)
Physician Discharge Summary  Patient ID: Randall Frazier MRN: [id number] DOB/AGE: [birth year] 57 y.o.  Admit date: 12/20/2012 Discharge date: 12/21/2012  Admission Diagnoses:  Cervical spondylosis without myelopathy C5-6 and C6-7 with foraminal stenosis  Discharge Diagnoses:  Principal Problem:   Cervical spondylosis without myelopathy C5-6 and C6-7 with foraminal stenosis  Past Medical History  Diagnosis Date  . Hypertension   . Diabetes mellitus   . Gout   . H/O exercise stress test     many yrs. ago, no need for f/u with cardiac   . Pneumonia     while in [employer], [id number]   . Arthritis     shoulder- both, neck, spine, GOUT  . Weight loss 2011    100 lbs. weight loss -over 18 mon. period     Surgeries: Procedure(s): C5-6, C6-7 ANTERIOR CERVICAL DISCECTOMY AND FUSION, ALLOGRAFT, PLATE on 7/91/5056   Consultants (if any):    Discharged Condition: Improved  Hospital Course: Randall Frazier is an 57 y.o. male who was admitted 12/20/2012 with a diagnosis of Cervical spondylosis without myelopathy and went to the operating room on 12/20/2012 and underwent the above named procedures.    He was given perioperative antibiotics:  Anti-infectives   Start     Dose/Rate Route Frequency Ordered Stop   12/20/12 2000  ceFAZolin (ANCEF) IVPB 1 g/50 mL premix     1 g 100 mL/hr over 30 Minutes Intravenous Every 8 hours 12/20/12 1646 12/21/12 0440   12/20/12 0600  ceFAZolin (ANCEF) 3 g in dextrose 5 % 50 mL IVPB     3 g 160 mL/hr over 30 Minutes Intravenous On call to O.R. 12/19/12 1335 12/20/12 1249    .  He was given sequential compression devices, early ambulation for DVT prophylaxis.  He benefited maximally from the hospital stay and there were no complications.    Recent vital signs:  Filed Vitals:   12/21/12 0546  BP: 153/88  Pulse: 98  Temp: 97.3 F (36.3 C)  Resp: 18    Recent laboratory studies:  Lab Results  Component Value Date   HGB 16.6 12/16/2012   HGB 13.6  10/26/2011   Lab Results  Component Value Date   WBC 6.7 12/16/2012   PLT 130* 12/16/2012   No results found for this basename: INR   Lab Results  Component Value Date   NA 138 12/16/2012   K 3.9 12/16/2012   CL 99 12/16/2012   CO2 28 12/16/2012   BUN 17 12/16/2012   CREATININE 1.05 12/16/2012   GLUCOSE 108* 12/16/2012    Discharge Medications:     Medication List         acetaminophen 500 MG tablet  Commonly known as:  TYLENOL  Take 1,000 mg by mouth daily as needed for pain.     allopurinol 100 MG tablet  Commonly known as:  ZYLOPRIM  Take 100 mg by mouth at bedtime.     amLODipine 10 MG tablet  Commonly known as:  NORVASC  Take 5 mg by mouth daily after breakfast.     aspirin EC 81 MG tablet  Take 81 mg by mouth daily.     atorvastatin 20 MG tablet  Commonly known as:  LIPITOR  Take 20 mg by mouth at bedtime.     Cholecalciferol 2000 UNITS Tabs  Take 4,000 Units by mouth at bedtime.     fish oil-omega-3 fatty acids 1000 MG capsule  Take 1,000 mg by mouth 2 (two) times daily.  meloxicam 15 MG tablet  Commonly known as:  MOBIC  Take 15 mg by mouth at bedtime.     metFORMIN 500 MG tablet  Commonly known as:  GLUCOPHAGE  Take 500 mg by mouth daily with breakfast.     methocarbamol 500 MG tablet  Commonly known as:  ROBAXIN  Take 1 tablet (500 mg total) by mouth every 6 (six) hours as needed (spasm).     oxyCODONE-acetaminophen 5-325 MG per tablet  Commonly known as:  ROXICET  Take 1-2 tablets by mouth every 4 (four) hours as needed for pain.     phentermine 37.5 MG capsule  Take 37.5 mg by mouth daily as needed (weight loss/energy).     testosterone cypionate 200 MG/ML injection  Commonly known as:  DEPOTESTOTERONE CYPIONATE  Inject 200 mg into the muscle every 28 (twenty-eight) days. Next dose scheduled 8/4     traZODone 150 MG tablet  Commonly known as:  DESYREL  Take 150 mg by mouth at bedtime.        Diagnostic Studies: Dg Chest 2 View  12/16/2012    *RADIOLOGY REPORT*  Clinical Data: Hypertension.  Preoperative for cervical spondylosis  CHEST - 2 VIEW  Comparison: Sep 21, 2006  Findings: There is a stable calcified granuloma in the right upper mid lung.  There is no focal infiltrate, pulmonary edema, or pleural effusion.  The mediastinal contour and cardiac silhouette are normal.  The soft tissues and osseous structures are stable.  IMPRESSION: No acute cardiopulmonary disease identified.   Original Report Authenticated By: Abelardo Diesel, M.D.   Dg Cervical Spine 2-3 Views  12/20/2012   *RADIOLOGY REPORT*  Clinical Data: C5-C7 ACDF.  DG C-ARM 61-120 MIN,CERVICAL SPINE - 2-3 VIEW  Technique: Four intraoperative fluoroscopic spot films.  Comparison:  MRI 10/26/2012.  Findings: Spot films demonstrate C5-C7 ACDF placement. Endotracheal tube and nasogastric tube present.  IMPRESSION: C5-C7 ACDF.   Original Report Authenticated By: Dereck Ligas, M.D.   Dg C-arm [phone] Min  12/20/2012   *RADIOLOGY REPORT*  Clinical Data: C5-C7 ACDF.  DG C-ARM 61-120 MIN,CERVICAL SPINE - 2-3 VIEW  Technique: Four intraoperative fluoroscopic spot films.  Comparison:  MRI 10/26/2012.  Findings: Spot films demonstrate C5-C7 ACDF placement. Endotracheal tube and nasogastric tube present.  IMPRESSION: C5-C7 ACDF.   Original Report Authenticated By: Dereck Ligas, M.D.   DISPOSITION:  HOME No lifting greater than 10 lbs. No overhead use of arms. Avoid bending,and twisting neck. Walk in house for first week them may start to get out slowly increasing distance up to one mile by 3 weeks post op. Keep incision dry for 3 days, may then bathe and wet incision using a covered collar when showering. Call if any fevers >101, chills, or increasing numbness or weakness or increased swelling or drainage.        Follow-up Information   Follow up with Marybelle Killings, MD. Schedule an appointment as soon as possible for a visit in 2 weeks. (or as scheduled)    Contact information:    [place] [address] [phone]        Signed: Epimenio Foot 12/21/2012, 8:44 AM

## 2012-12-21 NOTE — Op Note (Signed)
NAMEJOBIN, MONTELONGO NO.:  [id number]  MEDICAL RECORD NO.:  [id number]  LOCATION:  5N23C                        FACILITY:  [facility]  PHYSICIAN:  Seila Liston C. Lorin Mercy, M.D.    DATE OF BIRTH:  04/30/1956  DATE OF PROCEDURE:  12/20/2012 DATE OF DISCHARGE:                              OPERATIVE REPORT   PREOPERATIVE DIAGNOSIS:  Spondylosis with foraminal stenosis. C5-6, C6- 7.  POSTOPERATIVE DIAGNOSIS:  Spondylosis with foraminal stenosis. C5-6, C6- 7.  PROCEDURE:  C5-6, C6-7 anterior cervical diskectomy, and plate, allograft, 7 mm cortical cancellous at both levels.  DePuy locking plate.  SURGEON:  Jaylee Lantry C. Lorin Mercy, M.D.  ASSISTANT:  Epimenio Foot, PA-C, medically necessary and present for the entire procedure.  ANESTHESIA:  GOT plus 5 mL Marcaine local.  ESTIMATED BLOOD LOSS:  Less than 100 mL.  COMPLICATIONS:  None.  DESCRIPTION OF PROCEDURE:  After induction of general anesthesia, orotracheal intubation, the head halter traction was applied.  Arms were tucked at the sides with pads for the ulnar nerve.  Neck was prepped with DuraPrep.  It was drying.  Time-out procedure was completed.  The area was squared with towels, sterile skin marker in the prominent skin fold overlying palpable landmarks for appropriate exposure for both the involved surgical levels.  Betadine and Steri-Drape application, sterile Mayo stand at the head and thyroid sheets and drapes.  Time-out procedure was completed.  Incision was started at the midline extending to the left.  Platysma was divided in line with the skin incisions. Prominent vein was isolated and double tied off with 2-0 Vicryl.  The bipolar cauterized.  Blunt dissection with carotid sheath and contents lateral.  Longus coli was encountered.  Prominent spur at C5-6 was noted as well as the lesser spur at C6-7.  Short 25 needle, straight clamp was placed in the C5-6 level confirmed with lateral C-arm visualization  that this was correct level. C5-6 was tighter than was first operative level and self-retaining Cloward retractors were placed right and left with teeth blades smooth blades up and down.  Anterior spurs removed with a Beyer rongeur and using the operative microscope, diskectomy was performed using combination Cloward scalpel 4 mm tooth bur and a 1 and 2 mm Kerrisons.  Posterior longitudinal ligament was taken down.  Spur was removed uncovertebral both right and left.  There was considerable amount of disk material, in combination with the hard and soft protrusion causing narrowing.  Once the dura was exposed to decompress right and left in the lateral gutters.  Endplates were rasped and smoothed and curetted and a small file was used.  Trial sizers showed a 7 gave a nice tight fit, head halter traction was applied by the CRNA.  Graft was impacted, countersunk 2 mm.  Identical procedure was repeated at the C6-7 level. This had tighter disk space more overhanging spurs and slightly more shingling posteriorly.  Little bit more endplate was taken inferiorly on the C7 vertebrae to allow exposure posteriorly at the level of the disk space for decompression of disk material and spurs off the dura.  Dura was well visualized right and left.  Once it was well decompressed. Graft  was also fitted, countersunk and then anterior spurs were removed with a Melrose Nakayama and pituitary so the plate would sit flat.  Skyline DePuy plate was selected, placed, 30 mm small prong holder was placed, checked under fluoroscopy, AP and lateral.  Split slightly more to the midline, since it was slightly centered to the left.  Screws were in the bottom third of C5-6 and the middle of C6-7 and in safe position on C7.  All screws were hand drilled, inserted 14 mm screws, and then after checking under fluoroscopy,  final pictures, screws were locked in with tiny locking screw driver at the adjacent screw head, locking each  screw. Copious irrigation.  Operative field was dry.  Hemovac was placed in and out technique, 3-0 Vicryl placed in the platysma, 4-0 Vicryl subcuticular closure.  Tincture of benzoin, Steri-Strips, Marcaine infiltration, 4x4s, tape and soft cervical collar.  The patient tolerated the procedure well.  He will stay overnight for IV pain medication.     Ceyda Peterka C. Lorin Mercy, M.D.     MCY/MEDQ  D:  12/20/2012  T:  12/21/2012  Job:  [id number]

## 2013-03-17 ENCOUNTER — Other Ambulatory Visit: Payer: Self-pay

## 2014-04-29 IMAGING — CT CT HEAD W/O CM
1 series · 16 of 30 positions shown, 20 images · non-contrast
Comparison: None.

CLINICAL DATA: Left frontal headache since 2 hours ago. Worst
headache of life.

CT HEAD WITHOUT CONTRAST
TECHNIQUE: Contiguous axial images were obtained from the base of
the skull through the vertex without contrast.

[Series 2: head routine 4.8 h37s · axial · 0.47mm/px · z∈[-95,+40]mm · 16 of 30 slices shown, 20 images]
[im 2/30  brain]
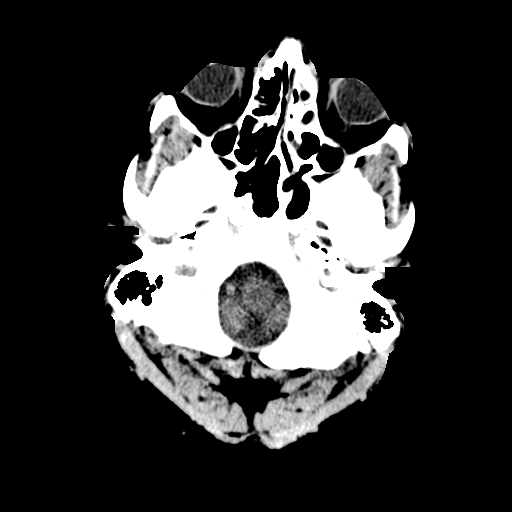
[im 2/30  bone]
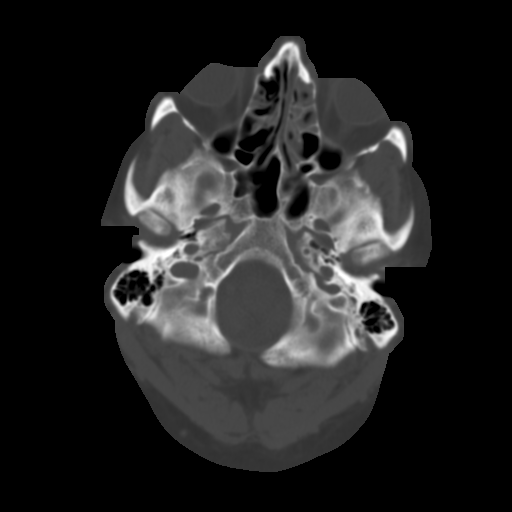
[im 4/30  brain]
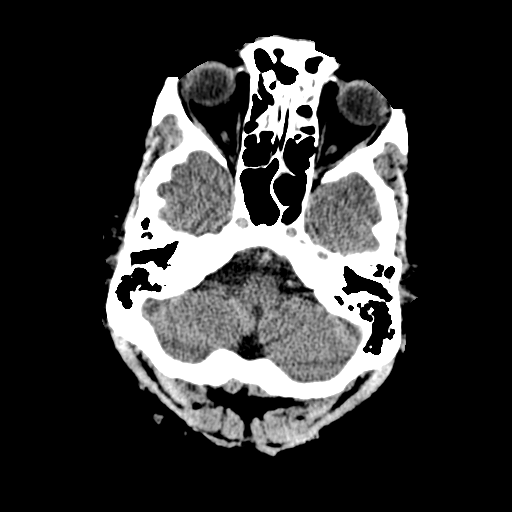
[im 6/30  brain]
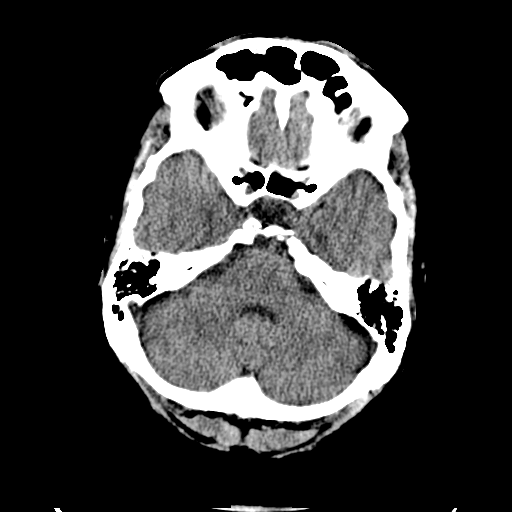
[im 8/30  brain]
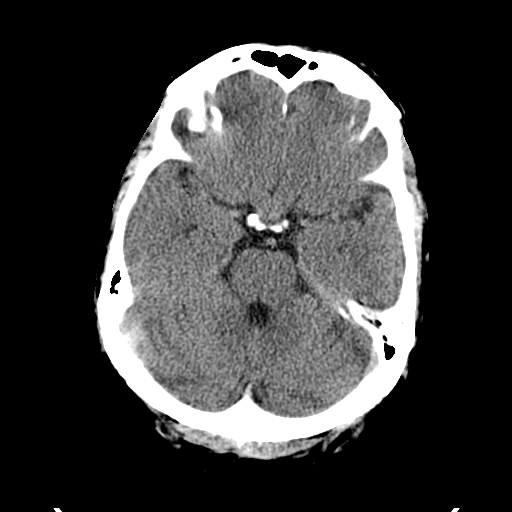
[im 9/30  brain]
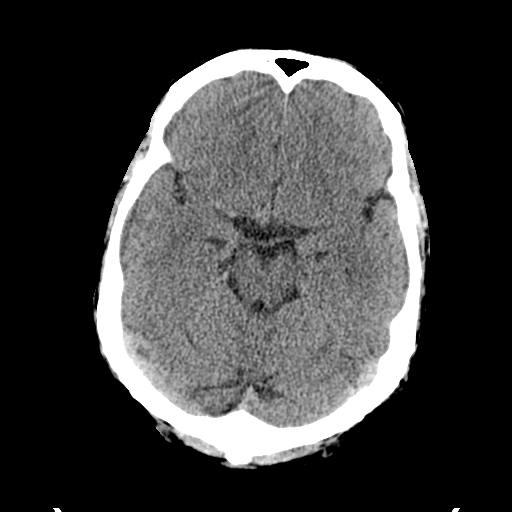
[im 9/30  bone]
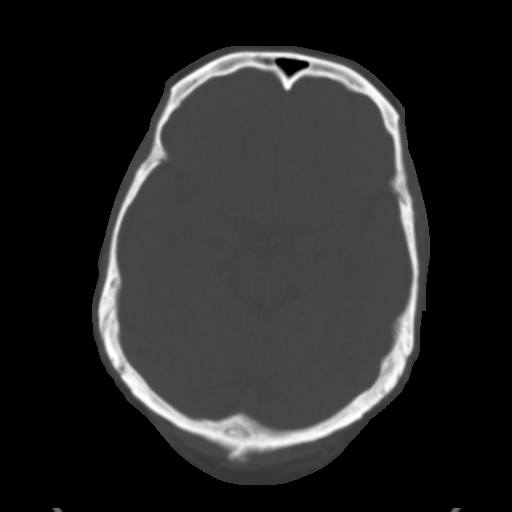
[im 11/30  brain]
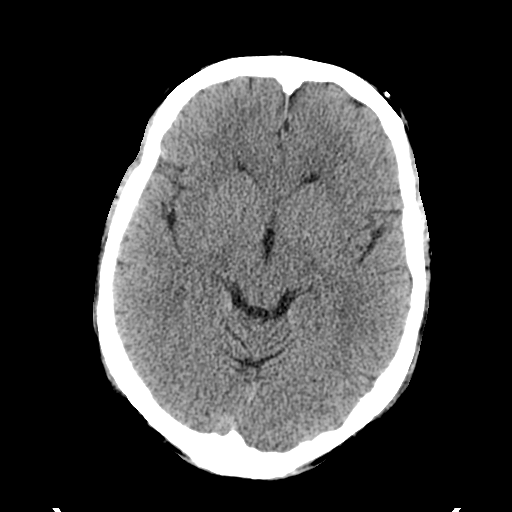
[im 13/30  brain]
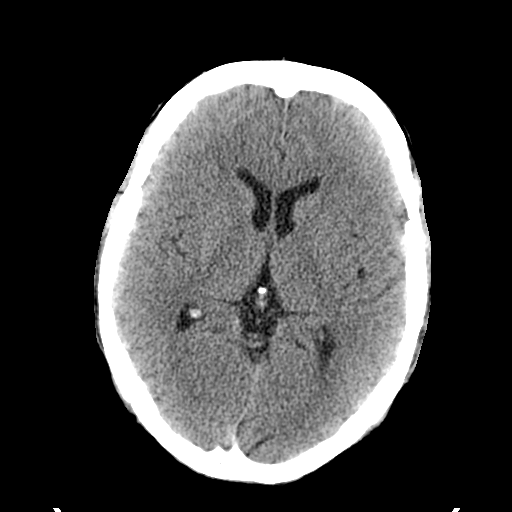
[im 15/30  brain]
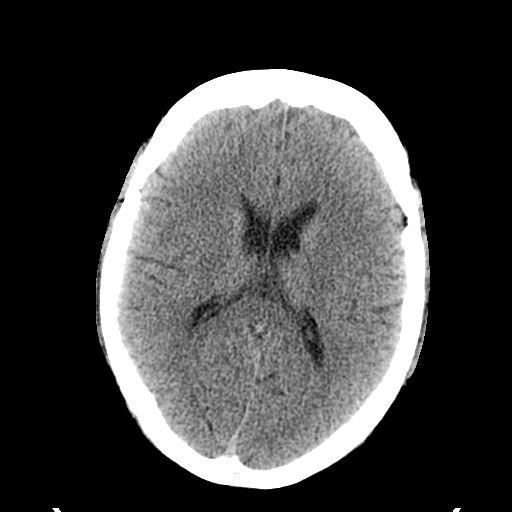
[im 16/30  brain]
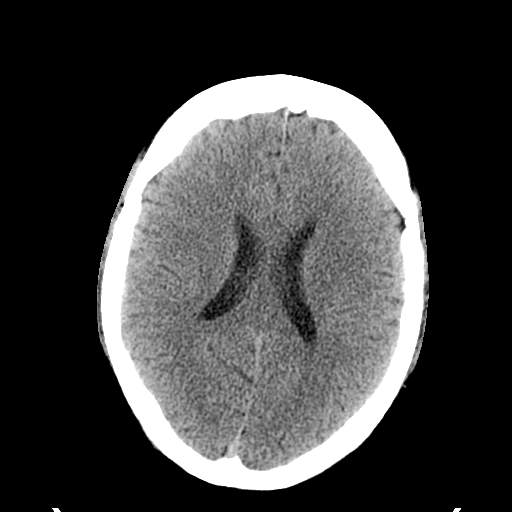
[im 16/30  bone]
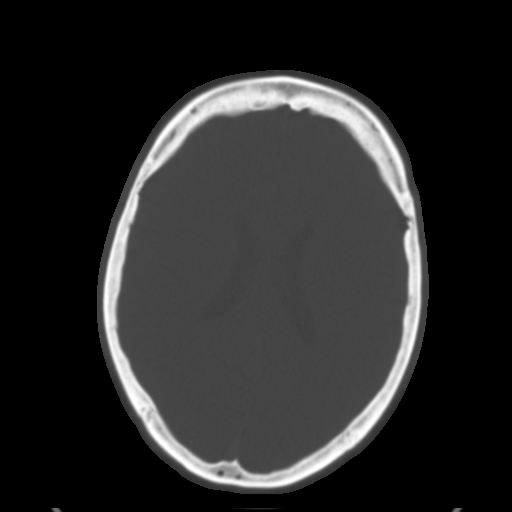
[im 18/30  brain]
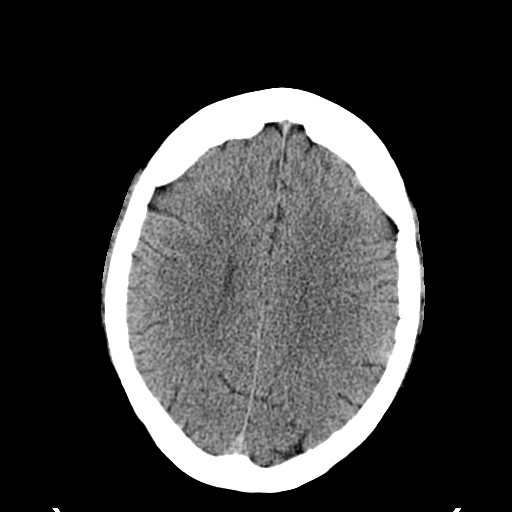
[im 20/30  brain]
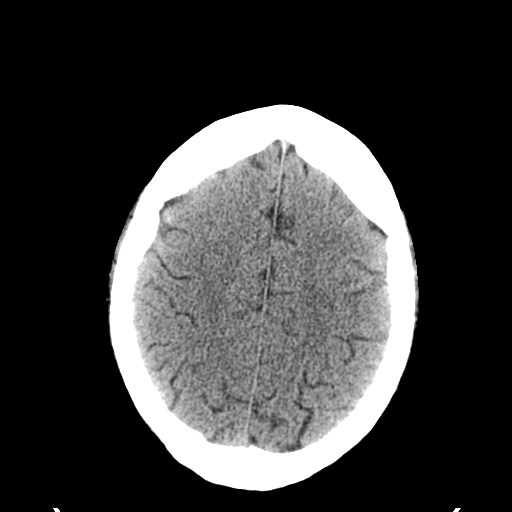
[im 22/30  brain]
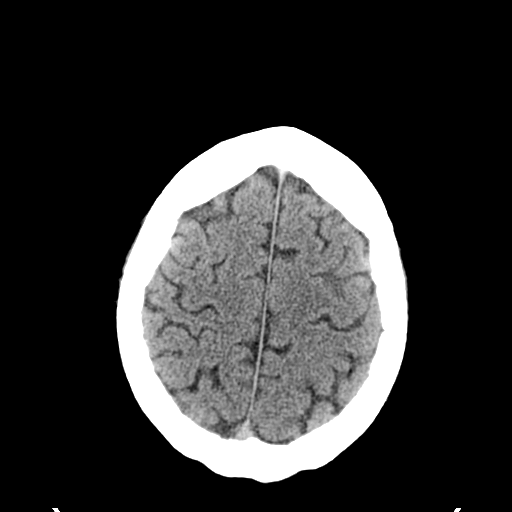
[im 23/30  brain]
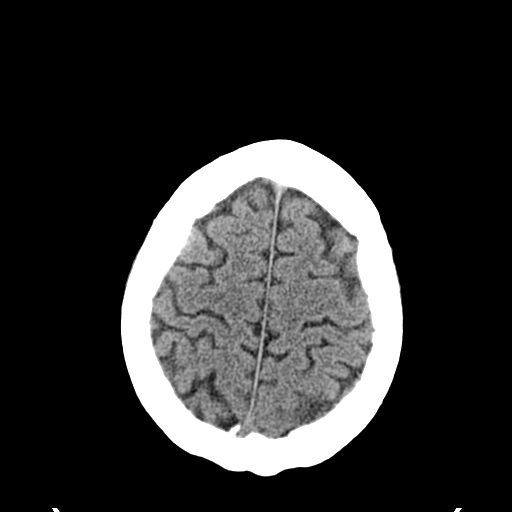
[im 23/30  bone]
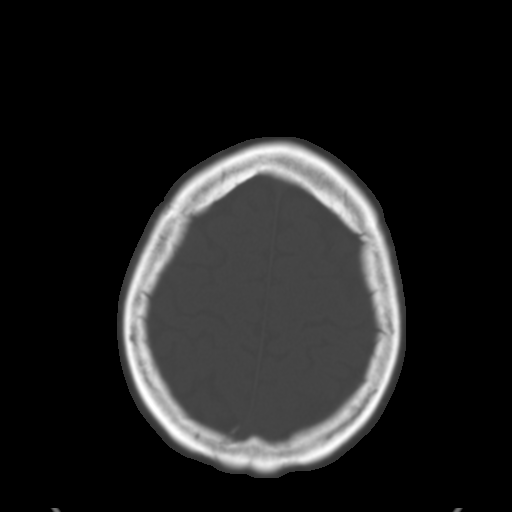
[im 25/30  brain]
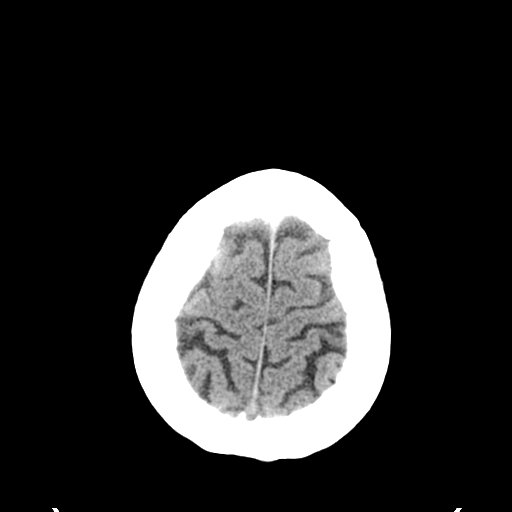
[im 27/30  brain]
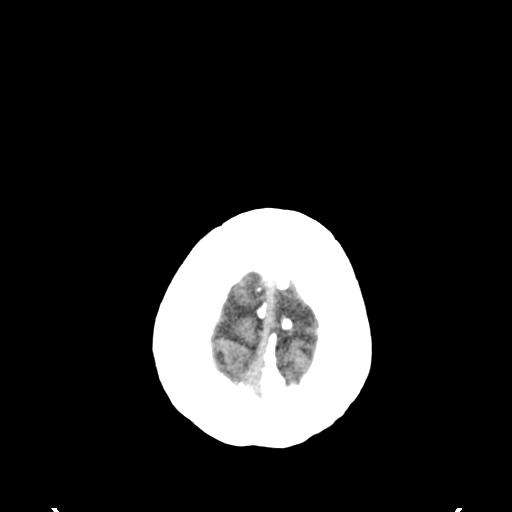
[im 29/30  brain]
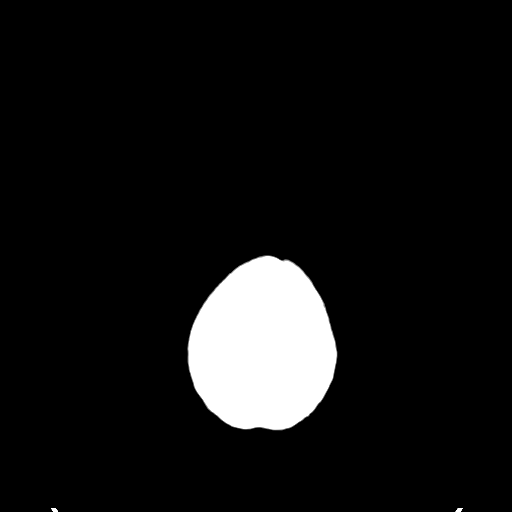

[16 of 30 positions shown; findings below may reference images not displayed]

FINDINGS: Left greater than right ethmoid sinus disease is present
with mucosal thickening.  Right sphenoid sinus mucous retention
cyst or polyp.  Mastoid air cells clear.  No mass lesion, mass
effect, midline shift, hydrocephalus, hemorrhage.  No territorial
ischemia or acute infarction.
IMPRESSION: 1.  No acute intracranial abnormality.
2.  Mild mucosal paranasal sinus disease.

## 2014-09-03 ENCOUNTER — Emergency Department (HOSPITAL_COMMUNITY): Payer: Non-veteran care

## 2014-09-03 ENCOUNTER — Emergency Department (HOSPITAL_COMMUNITY)
Admission: EM | Admit: 2014-09-03 | Discharge: 2014-09-03 | Disposition: A | Discharge status: Home / Self Care | Payer: Non-veteran care | Attending: Emergency Medicine | Admitting: Emergency Medicine

## 2014-09-03 DIAGNOSIS — Z7982 Long term (current) use of aspirin: Secondary | ICD-10-CM | POA: Insufficient documentation

## 2014-09-03 DIAGNOSIS — S0990XA Unspecified injury of head, initial encounter: Secondary | ICD-10-CM | POA: Diagnosis present

## 2014-09-03 DIAGNOSIS — Y9389 Activity, other specified: Secondary | ICD-10-CM | POA: Diagnosis not present

## 2014-09-03 DIAGNOSIS — S060X0A Concussion without loss of consciousness, initial encounter: Secondary | ICD-10-CM | POA: Diagnosis not present

## 2014-09-03 DIAGNOSIS — Z8701 Personal history of pneumonia (recurrent): Secondary | ICD-10-CM | POA: Diagnosis not present

## 2014-09-03 DIAGNOSIS — Y998 Other external cause status: Secondary | ICD-10-CM | POA: Insufficient documentation

## 2014-09-03 DIAGNOSIS — Z79899 Other long term (current) drug therapy: Secondary | ICD-10-CM | POA: Diagnosis not present

## 2014-09-03 DIAGNOSIS — M109 Gout, unspecified: Secondary | ICD-10-CM | POA: Insufficient documentation

## 2014-09-03 DIAGNOSIS — I10 Essential (primary) hypertension: Secondary | ICD-10-CM | POA: Diagnosis not present

## 2014-09-03 DIAGNOSIS — E119 Type 2 diabetes mellitus without complications: Secondary | ICD-10-CM | POA: Insufficient documentation

## 2014-09-03 DIAGNOSIS — M1990 Unspecified osteoarthritis, unspecified site: Secondary | ICD-10-CM | POA: Insufficient documentation

## 2014-09-03 DIAGNOSIS — Y92410 Unspecified street and highway as the place of occurrence of the external cause: Secondary | ICD-10-CM | POA: Insufficient documentation

## 2014-09-03 DIAGNOSIS — Z791 Long term (current) use of non-steroidal anti-inflammatories (NSAID): Secondary | ICD-10-CM | POA: Insufficient documentation

## 2014-09-03 LAB — I-STAT CHEM 8, ED
BUN: 7 mg/dL (ref 6–23)
Calcium, Ion: 1.16 mmol/L (ref 1.12–1.23)
Chloride: 100 mmol/L (ref 96–112)
Creatinine, Ser: 0.9 mg/dL (ref 0.50–1.35)
Glucose, Bld: 127 mg/dL — ABNORMAL HIGH (ref 70–99)
HCT: 39 % (ref 39.0–52.0)
Hemoglobin: 13.3 g/dL (ref 13.0–17.0)
Potassium: 3.4 mmol/L — ABNORMAL LOW (ref 3.5–5.1)
Sodium: 138 mmol/L (ref 135–145)
TCO2: 24 mmol/L (ref 0–100)

## 2014-09-03 LAB — COMPREHENSIVE METABOLIC PANEL
ALT: 44 U/L (ref 0–53)
AST: 60 U/L — ABNORMAL HIGH (ref 0–37)
Albumin: 3.6 g/dL (ref 3.5–5.2)
Alkaline Phosphatase: 49 U/L (ref 39–117)
Anion gap: 8 (ref 5–15)
BUN: 5 mg/dL — ABNORMAL LOW (ref 6–23)
CO2: 26 mmol/L (ref 19–32)
Calcium: 9.1 mg/dL (ref 8.4–10.5)
Chloride: 101 mmol/L (ref 96–112)
Creatinine, Ser: 0.85 mg/dL (ref 0.50–1.35)
GFR calc Af Amer: >90 mL/min (ref >90)
GFR calc non Af Amer: >90 mL/min (ref >90)
Glucose, Bld: 126 mg/dL — ABNORMAL HIGH (ref 70–99)
Potassium: 3.2 mmol/L — ABNORMAL LOW (ref 3.5–5.1)
Sodium: 135 mmol/L (ref 135–145)
Total Bilirubin: 1.1 mg/dL (ref 0.3–1.2)
Total Protein: 6.5 g/dL (ref 6.0–8.3)

## 2014-09-03 LAB — SAMPLE TO BLOOD BANK

## 2014-09-03 LAB — CBC
HCT: 37.6 % — ABNORMAL LOW (ref 39.0–52.0)
Hemoglobin: 13.7 g/dL (ref 13.0–17.0)
MCH: 31.5 pg (ref 26.0–34.0)
MCHC: 36.4 g/dL — ABNORMAL HIGH (ref 30.0–36.0)
MCV: 86.4 fL (ref 78.0–100.0)
Platelets: 108 10*3/uL — ABNORMAL LOW (ref 150–400)
RBC: 4.35 MIL/uL (ref 4.22–5.81)
RDW: 13 % (ref 11.5–15.5)
WBC: 5 10*3/uL (ref 4.0–10.5)

## 2014-09-03 LAB — PROTIME-INR
INR: 1.12 (ref 0.00–1.49)
Prothrombin Time: 14.6 seconds (ref 11.6–15.2)

## 2014-09-03 LAB — ETHANOL: Alcohol, Ethyl (B): <5 mg/dL (ref 0–9)

## 2014-09-03 NOTE — ED Notes (Signed)
Wife took clothing and boots home.
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
## 2014-09-03 NOTE — ED Notes (Signed)
Ambulated pt in hallway without any c/o dizziness/nausea.

## 2014-09-03 NOTE — Discharge Instructions (Signed)
CT scan had a small area which may show a bruise in the brain.  This may be an artifact but should probably be followed up repeat CT scan in approximately 2 weeks.  Return to emergency department if you have any worsening headache, nausea, vomiting, confusion or other concerns.   Head Injury You have received a head injury. It does not appear serious at this time. Headaches and vomiting are common following head injury. It should be easy to awaken from sleeping. Sometimes it is necessary for you to stay in the emergency department for a while for observation. Sometimes admission to the hospital may be needed. After injuries such as yours, most problems occur within the first 24 hours, but side effects may occur up to 7-10 days after the injury. It is important for you to carefully monitor your condition and contact your health care provider or seek immediate medical care if there is a change in your condition. WHAT ARE THE TYPES OF HEAD INJURIES? Head injuries can be as minor as a bump. Some head injuries can be more severe. More severe head injuries include:  A jarring injury to the brain (concussion).  A bruise of the brain (contusion). This mean there is bleeding in the brain that can cause swelling.  A cracked skull (skull fracture).  Bleeding in the brain that collects, clots, and forms a bump (hematoma). WHAT CAUSES A HEAD INJURY? A serious head injury is most likely to happen to someone who is in a car wreck and is not wearing a seat belt. Other causes of major head injuries include bicycle or motorcycle accidents, sports injuries, and falls. HOW ARE HEAD INJURIES DIAGNOSED? A complete history of the event leading to the injury and your current symptoms will be helpful in diagnosing head injuries. Many times, pictures of the brain, such as CT or MRI are needed to see the extent of the injury. Often, an overnight hospital stay is necessary for observation.  WHEN SHOULD I SEEK IMMEDIATE  MEDICAL CARE?  You should get help right away if:  You have confusion or drowsiness.  You feel sick to your stomach (nauseous) or have continued, forceful vomiting.  You have dizziness or unsteadiness that is getting worse.  You have severe, continued headaches not relieved by medicine. Only take over-the-counter or prescription medicines for pain, fever, or discomfort as directed by your health care provider.  You do not have normal function of the arms or legs or are unable to walk.  You notice changes in the black spots in the center of the colored part of your eye (pupil).  You have a clear or bloody fluid coming from your nose or ears.  You have a loss of vision. During the next 24 hours after the injury, you must stay with someone who can watch you for the warning signs. This person should contact local emergency services (911 in the [place] if you have seizures, you become unconscious, or you are unable to wake up. HOW CAN I PREVENT A HEAD INJURY IN THE FUTURE? The most important factor for preventing major head injuries is avoiding motor vehicle accidents. To minimize the potential for damage to your head, it is crucial to wear seat belts while riding in motor vehicles. Wearing helmets while bike riding and playing collision sports (like football) is also helpful. Also, avoiding dangerous activities around the house will further help reduce your risk of head injury.  WHEN CAN I RETURN TO NORMAL ACTIVITIES AND ATHLETICS? You should  be reevaluated by your health care provider before returning to these activities. If you have any of the following symptoms, you should not return to activities or contact sports until 1 week after the symptoms have stopped:  Persistent headache.  Dizziness or vertigo.  Poor attention and concentration.  Confusion.  Memory problems.  Nausea or vomiting.  Fatigue or tire easily.  Irritability.  Intolerant of bright lights or loud  noises.  Anxiety or depression.  Disturbed sleep. MAKE SURE YOU:   Understand these instructions.  Will watch your condition.  Will get help right away if you are not doing well or get worse. Document Released: 04/28/2005 Document Revised: 05/03/2013 Document Reviewed: 01/03/2013 [facility] Patient Information 2015 [place], [place]. This information is not intended to replace advice given to you by your health care provider. Make sure you discuss any questions you have with your health care provider.  Traumatic Brain Injury Traumatic brain injury (TBI) occurs when an injury to the head causes the brain to move back and forth. The risk of brain injury varies with the severity of the trauma. The damage can be confined to one area of the brain (focal) or involve different areas of the brain (diffuse). The severity of a brain injury can range from:  A blow or jolt to the head that disrupts the normal function of the brain (concussion).  A deep state of unconsciousness (coma).  Death. CAUSES  A brain injury can result from:  A closed head injury. This occurs when the head suddenly and violently hits an object but the object does not break through the skull. Examples include:  A direct blow (hitting your head on a hard surface).  An indirect blow (when your head moves rapidly and violently back and forth, like in a car crash). This injury is called contrecoup (involving a blow and counter blow). Shaken baby syndrome is a severe type of this injury. It happens when a baby is shaken forcibly enough to cause extreme contrecoup injury.  Penetrating head injury. A penetrating head injury occurs when an object pierces the skull and enters the brain tissue. Examples include:  A skull fracture occurs when the skull cracks or breaks.  A depressed skull fracture occurs when pieces of the broken skull press into the tissue of the brain. This can cause bruising of the brain tissue called a  contusion. In both closed and penetrating head injuries, damage to blood vessels can cause heavy bleeding into or around the brain.  SYMPTOMS  The symptoms of a TBI depend on the type and severity of the injury. The most common symptoms include:   Confusion (disorientation) or other thinking problems.  An inability to remember events around the time of the injury (amnesia).  Difficulty staying awake or passing out (loss of consciousness).  Difficulty maintaining your balance or feeling unsteady.  Slow reaction time.  Difficulty learning or remembering things you have heard.  Headache.  Blurry vision.  Vomiting.  Seizures.  Swelling of the scalp. This occurs because of bleeding or swelling under the skin of the skull when the head is hit. TREATMENT Treatment of traumatic brain injury can involve a range of different medical options:  If a brain injury is moderate to severe, a hospital stay will be necessary to monitor:  Neurological status.  Pressure or swelling of the brain (intracranial pressure).  For seizures.  Severe brain injury cases may need surgery to:  Control bleeding.  Relieve pressure on the brain.  Remove objects  from the brain that result from a penetrating injury.  Repair the skull from an injury.  Long-term treatment of a brain injury can involve rehabilitation work such as:  Physical therapy.  Occupational therapy.  Speech therapy. PROGNOSIS The outcome of TBI depends on the cause of the injury, location, severity, and extent of neurological damage. Outcomes range from good recovery to death. Long term consequences of a TBI can include:  Difficulty concentrating or having a short attention span.  Change in personality.  Irritability.  Headaches.  Blurry vision.  Sleepiness.  Depression.  Unsteadiness that makes walking or standing hard to do. For more information and support, contact: [employer]  [employer] and Stroke.  Document Released: 04/18/2002 Document Revised: 02/16/2013 Document Reviewed: 08/11/2013 [facility] Patient Information 2015 [place], [place]. This information is not intended to replace advice given to you by your health care provider. Make sure you discuss any questions you have with your health care provider.

## 2015-03-06 DIAGNOSIS — M19011 Primary osteoarthritis, right shoulder: Secondary | ICD-10-CM | POA: Insufficient documentation

## 2015-03-06 DIAGNOSIS — R0609 Other forms of dyspnea: Secondary | ICD-10-CM | POA: Insufficient documentation

## 2015-03-06 DIAGNOSIS — E1142 Type 2 diabetes mellitus with diabetic polyneuropathy: Secondary | ICD-10-CM | POA: Insufficient documentation

## 2015-03-06 DIAGNOSIS — I2510 Atherosclerotic heart disease of native coronary artery without angina pectoris: Secondary | ICD-10-CM | POA: Diagnosis present

## 2015-03-06 DIAGNOSIS — I10 Essential (primary) hypertension: Secondary | ICD-10-CM | POA: Diagnosis present

## 2015-03-06 DIAGNOSIS — E6609 Other obesity due to excess calories: Secondary | ICD-10-CM | POA: Insufficient documentation

## 2015-04-19 ENCOUNTER — Ambulatory Visit
Admission: RE | Admit: 2015-04-19 | Discharge: 2015-04-19 | Disposition: A | Discharge status: Home / Self Care | Source: Ambulatory Visit | Attending: Family Medicine | Admitting: Family Medicine

## 2015-04-19 ENCOUNTER — Other Ambulatory Visit: Payer: Self-pay | Admitting: Family Medicine

## 2015-04-19 DIAGNOSIS — R0600 Dyspnea, unspecified: Secondary | ICD-10-CM

## 2015-04-20 ENCOUNTER — Other Ambulatory Visit: Payer: Self-pay | Admitting: Family Medicine

## 2015-04-20 DIAGNOSIS — R410 Disorientation, unspecified: Secondary | ICD-10-CM

## 2015-04-24 DIAGNOSIS — R079 Chest pain, unspecified: Secondary | ICD-10-CM | POA: Insufficient documentation

## 2015-04-26 ENCOUNTER — Other Ambulatory Visit

## 2015-07-02 DIAGNOSIS — M19019 Primary osteoarthritis, unspecified shoulder: Secondary | ICD-10-CM | POA: Insufficient documentation

## 2018-06-09 DIAGNOSIS — D329 Benign neoplasm of meninges, unspecified: Secondary | ICD-10-CM | POA: Insufficient documentation

## 2019-12-14 ENCOUNTER — Other Ambulatory Visit: Payer: Self-pay

## 2019-12-14 ENCOUNTER — Emergency Department (HOSPITAL_COMMUNITY): Payer: Medicare Other

## 2019-12-14 ENCOUNTER — Inpatient Hospital Stay (HOSPITAL_COMMUNITY)
Admission: EM | Admit: 2019-12-14 | Discharge: 2019-12-19 | DRG: 432 | Disposition: A | Discharge status: Home / Self Care | Payer: Medicare Other | Attending: Internal Medicine | Admitting: Internal Medicine

## 2019-12-14 ENCOUNTER — Encounter (HOSPITAL_COMMUNITY): Payer: Self-pay | Admitting: Emergency Medicine

## 2019-12-14 DIAGNOSIS — I8511 Secondary esophageal varices with bleeding: Secondary | ICD-10-CM | POA: Diagnosis present

## 2019-12-14 DIAGNOSIS — E8809 Other disorders of plasma-protein metabolism, not elsewhere classified: Secondary | ICD-10-CM | POA: Diagnosis not present

## 2019-12-14 DIAGNOSIS — E871 Hypo-osmolality and hyponatremia: Secondary | ICD-10-CM | POA: Diagnosis present

## 2019-12-14 DIAGNOSIS — K7200 Acute and subacute hepatic failure without coma: Secondary | ICD-10-CM | POA: Diagnosis present

## 2019-12-14 DIAGNOSIS — K922 Gastrointestinal hemorrhage, unspecified: Secondary | ICD-10-CM

## 2019-12-14 DIAGNOSIS — Z79899 Other long term (current) drug therapy: Secondary | ICD-10-CM | POA: Diagnosis not present

## 2019-12-14 DIAGNOSIS — K766 Portal hypertension: Secondary | ICD-10-CM | POA: Diagnosis present

## 2019-12-14 DIAGNOSIS — K269 Duodenal ulcer, unspecified as acute or chronic, without hemorrhage or perforation: Secondary | ICD-10-CM | POA: Diagnosis present

## 2019-12-14 DIAGNOSIS — I2510 Atherosclerotic heart disease of native coronary artery without angina pectoris: Secondary | ICD-10-CM | POA: Diagnosis present

## 2019-12-14 DIAGNOSIS — L03115 Cellulitis of right lower limb: Secondary | ICD-10-CM | POA: Diagnosis present

## 2019-12-14 DIAGNOSIS — I8500 Esophageal varices without bleeding: Secondary | ICD-10-CM | POA: Diagnosis present

## 2019-12-14 DIAGNOSIS — E7220 Disorder of urea cycle metabolism, unspecified: Secondary | ICD-10-CM

## 2019-12-14 DIAGNOSIS — D689 Coagulation defect, unspecified: Secondary | ICD-10-CM | POA: Diagnosis present

## 2019-12-14 DIAGNOSIS — K7290 Hepatic failure, unspecified without coma: Secondary | ICD-10-CM

## 2019-12-14 DIAGNOSIS — G939 Disorder of brain, unspecified: Secondary | ICD-10-CM | POA: Diagnosis present

## 2019-12-14 DIAGNOSIS — K259 Gastric ulcer, unspecified as acute or chronic, without hemorrhage or perforation: Secondary | ICD-10-CM | POA: Diagnosis not present

## 2019-12-14 DIAGNOSIS — R0600 Dyspnea, unspecified: Secondary | ICD-10-CM | POA: Diagnosis not present

## 2019-12-14 DIAGNOSIS — K921 Melena: Secondary | ICD-10-CM | POA: Diagnosis present

## 2019-12-14 DIAGNOSIS — D329 Benign neoplasm of meninges, unspecified: Secondary | ICD-10-CM | POA: Diagnosis present

## 2019-12-14 DIAGNOSIS — D696 Thrombocytopenia, unspecified: Secondary | ICD-10-CM | POA: Diagnosis not present

## 2019-12-14 DIAGNOSIS — K7460 Unspecified cirrhosis of liver: Principal | ICD-10-CM | POA: Diagnosis present

## 2019-12-14 DIAGNOSIS — Z951 Presence of aortocoronary bypass graft: Secondary | ICD-10-CM | POA: Diagnosis not present

## 2019-12-14 DIAGNOSIS — K8020 Calculus of gallbladder without cholecystitis without obstruction: Secondary | ICD-10-CM | POA: Diagnosis present

## 2019-12-14 DIAGNOSIS — M109 Gout, unspecified: Secondary | ICD-10-CM | POA: Diagnosis present

## 2019-12-14 DIAGNOSIS — I10 Essential (primary) hypertension: Secondary | ICD-10-CM | POA: Diagnosis present

## 2019-12-14 DIAGNOSIS — N179 Acute kidney failure, unspecified: Secondary | ICD-10-CM | POA: Diagnosis present

## 2019-12-14 DIAGNOSIS — E785 Hyperlipidemia, unspecified: Secondary | ICD-10-CM | POA: Diagnosis present

## 2019-12-14 DIAGNOSIS — R4182 Altered mental status, unspecified: Secondary | ICD-10-CM | POA: Diagnosis not present

## 2019-12-14 DIAGNOSIS — R109 Unspecified abdominal pain: Secondary | ICD-10-CM

## 2019-12-14 DIAGNOSIS — E119 Type 2 diabetes mellitus without complications: Secondary | ICD-10-CM

## 2019-12-14 DIAGNOSIS — K254 Chronic or unspecified gastric ulcer with hemorrhage: Secondary | ICD-10-CM | POA: Diagnosis present

## 2019-12-14 DIAGNOSIS — Z20822 Contact with and (suspected) exposure to covid-19: Secondary | ICD-10-CM | POA: Diagnosis present

## 2019-12-14 DIAGNOSIS — K7682 Hepatic encephalopathy: Secondary | ICD-10-CM | POA: Diagnosis present

## 2019-12-14 DIAGNOSIS — K2970 Gastritis, unspecified, without bleeding: Secondary | ICD-10-CM | POA: Diagnosis present

## 2019-12-14 DIAGNOSIS — Z7984 Long term (current) use of oral hypoglycemic drugs: Secondary | ICD-10-CM | POA: Diagnosis not present

## 2019-12-14 DIAGNOSIS — R079 Chest pain, unspecified: Secondary | ICD-10-CM | POA: Diagnosis not present

## 2019-12-14 DIAGNOSIS — E1169 Type 2 diabetes mellitus with other specified complication: Secondary | ICD-10-CM

## 2019-12-14 DIAGNOSIS — Z7982 Long term (current) use of aspirin: Secondary | ICD-10-CM

## 2019-12-14 LAB — COMPREHENSIVE METABOLIC PANEL
ALT: 34 U/L (ref 0–44)
AST: 152 U/L — ABNORMAL HIGH (ref 15–41)
Albumin: 2.7 g/dL — ABNORMAL LOW (ref 3.5–5.0)
Alkaline Phosphatase: 66 U/L (ref 38–126)
Anion gap: 7 (ref 5–15)
BUN: 19 mg/dL (ref 8–23)
CO2: 21 mmol/L — ABNORMAL LOW (ref 22–32)
Calcium: 8.3 mg/dL — ABNORMAL LOW (ref 8.9–10.3)
Chloride: 101 mmol/L (ref 98–111)
Creatinine, Ser: 1.24 mg/dL (ref 0.61–1.24)
GFR calc Af Amer: >60 mL/min (ref >60)
GFR calc non Af Amer: >60 mL/min (ref >60)
Glucose, Bld: 99 mg/dL (ref 70–99)
Potassium: 4.2 mmol/L (ref 3.5–5.1)
Sodium: 129 mmol/L — ABNORMAL LOW (ref 135–145)
Total Bilirubin: 6.3 mg/dL — ABNORMAL HIGH (ref 0.3–1.2)
Total Protein: 7.4 g/dL (ref 6.5–8.1)

## 2019-12-14 LAB — CBC
HCT: 45.9 % (ref 39.0–52.0)
Hemoglobin: 16 g/dL (ref 13.0–17.0)
MCH: 34 pg (ref 26.0–34.0)
MCHC: 34.9 g/dL (ref 30.0–36.0)
MCV: 97.7 fL (ref 80.0–100.0)
Platelets: 59 10*3/uL — ABNORMAL LOW (ref 150–400)
RBC: 4.7 MIL/uL (ref 4.22–5.81)
RDW: 17.1 % — ABNORMAL HIGH (ref 11.5–15.5)
WBC: 11.6 10*3/uL — ABNORMAL HIGH (ref 4.0–10.5)
nRBC: 0 % (ref 0.0–0.2)

## 2019-12-14 LAB — HEPATIC FUNCTION PANEL
ALT: 34 U/L (ref 0–44)
AST: 158 U/L — ABNORMAL HIGH (ref 15–41)
Albumin: 2.6 g/dL — ABNORMAL LOW (ref 3.5–5.0)
Alkaline Phosphatase: 57 U/L (ref 38–126)
Bilirubin, Direct: 1.4 mg/dL — ABNORMAL HIGH (ref 0.0–0.2)
Indirect Bilirubin: 5.2 mg/dL — ABNORMAL HIGH (ref 0.3–0.9)
Total Bilirubin: 6.6 mg/dL — ABNORMAL HIGH (ref 0.3–1.2)
Total Protein: 7.9 g/dL (ref 6.5–8.1)

## 2019-12-14 LAB — URINALYSIS, ROUTINE W REFLEX MICROSCOPIC
Bilirubin Urine: NEGATIVE
Glucose, UA: NEGATIVE mg/dL
Ketones, ur: NEGATIVE mg/dL
Leukocytes,Ua: NEGATIVE
Nitrite: NEGATIVE
Protein, ur: 30 mg/dL — AB
RBC / HPF: >50 RBC/hpf — ABNORMAL HIGH (ref 0–5)
Specific Gravity, Urine: 1.02 (ref 1.005–1.030)
pH: 5 (ref 5.0–8.0)

## 2019-12-14 LAB — PROTIME-INR
INR: 2 — ABNORMAL HIGH (ref 0.8–1.2)
Prothrombin Time: 21.5 seconds — ABNORMAL HIGH (ref 11.4–15.2)

## 2019-12-14 LAB — TYPE AND SCREEN
ABO/RH(D): A POS
Antibody Screen: NEGATIVE

## 2019-12-14 LAB — AMMONIA: Ammonia: 54 umol/L — ABNORMAL HIGH (ref 9–35)

## 2019-12-14 LAB — CBG MONITORING, ED: Glucose-Capillary: 88 mg/dL (ref 70–99)

## 2019-12-14 LAB — SARS CORONAVIRUS 2 BY RT PCR (HOSPITAL ORDER, PERFORMED IN ~~LOC~~ HOSPITAL LAB): SARS Coronavirus 2: NEGATIVE

## 2019-12-14 MED ORDER — SPIRONOLACTONE 25 MG PO TABS
25.0000 mg | ORAL_TABLET | Freq: Two times a day (BID) | ORAL | Status: DC
  Administered 2019-12-15 – 2019-12-19 (×7): 25 mg via ORAL
  Filled 2019-12-14 (×9): qty 1

## 2019-12-14 MED ORDER — ONDANSETRON HCL 4 MG PO TABS: 4.0000 mg | ORAL_TABLET | Freq: Four times a day (QID) | ORAL | Status: DC | PRN

## 2019-12-14 MED ORDER — ONDANSETRON HCL 4 MG/2ML IJ SOLN: 4.0000 mg | Freq: Four times a day (QID) | INTRAMUSCULAR | Status: DC | PRN

## 2019-12-14 MED ORDER — SODIUM CHLORIDE 0.9% FLUSH
3.0000 mL | Freq: Two times a day (BID) | INTRAVENOUS | Status: DC
  Administered 2019-12-15 – 2019-12-18 (×5): 3 mL via INTRAVENOUS

## 2019-12-14 MED ORDER — METOPROLOL TARTRATE 50 MG PO TABS
50.0000 mg | ORAL_TABLET | Freq: Two times a day (BID) | ORAL | Status: DC
  Administered 2019-12-15 – 2019-12-19 (×8): 50 mg via ORAL
  Filled 2019-12-14 (×9): qty 1

## 2019-12-14 MED ORDER — ACETAMINOPHEN 325 MG PO TABS: 650.0000 mg | ORAL_TABLET | Freq: Four times a day (QID) | ORAL | Status: DC | PRN

## 2019-12-14 MED ORDER — SODIUM CHLORIDE 0.9 % IV SOLN
2.0000 g | Freq: Every day | INTRAVENOUS | Status: DC
  Administered 2019-12-15 – 2019-12-18 (×5): 2 g via INTRAVENOUS
  Filled 2019-12-14 (×5): qty 20

## 2019-12-14 MED ORDER — LORATADINE 10 MG PO TABS: 10.0000 mg | ORAL_TABLET | Freq: Every day | ORAL | Status: DC | PRN

## 2019-12-14 MED ORDER — ACETAMINOPHEN 650 MG RE SUPP: 650.0000 mg | Freq: Four times a day (QID) | RECTAL | Status: DC | PRN

## 2019-12-14 MED ORDER — SODIUM CHLORIDE 0.9 % IV SOLN
8.0000 mg/h | INTRAVENOUS | Status: AC
  Administered 2019-12-15 – 2019-12-16 (×4): 8 mg/h via INTRAVENOUS
  Filled 2019-12-14 (×5): qty 80

## 2019-12-14 MED ORDER — PANTOPRAZOLE SODIUM 40 MG IV SOLR
40.0000 mg | Freq: Two times a day (BID) | INTRAVENOUS | Status: DC
  Administered 2019-12-17 – 2019-12-18 (×3): 40 mg via INTRAVENOUS
  Filled 2019-12-14 (×3): qty 40

## 2019-12-14 MED ORDER — SODIUM CHLORIDE 0.9 % IV SOLN
80.0000 mg | Freq: Once | INTRAVENOUS | Status: AC
  Administered 2019-12-15: 80 mg via INTRAVENOUS
  Filled 2019-12-14: qty 80

## 2019-12-14 MED ORDER — HYDROCODONE-ACETAMINOPHEN 5-325 MG PO TABS: 1.0000 | ORAL_TABLET | ORAL | Status: DC | PRN

## 2019-12-14 MED ORDER — MONTELUKAST SODIUM 10 MG PO TABS
10.0000 mg | ORAL_TABLET | Freq: Every day | ORAL | Status: DC
  Administered 2019-12-15 – 2019-12-18 (×4): 10 mg via ORAL
  Filled 2019-12-14 (×5): qty 1

## 2019-12-14 MED ORDER — SODIUM CHLORIDE 0.9 % IV SOLN: INTRAVENOUS | Status: DC

## 2019-12-14 MED ORDER — INSULIN ASPART 100 UNIT/ML ~~LOC~~ SOLN
0.0000 [IU] | SUBCUTANEOUS | Status: DC
  Administered 2019-12-15: 2 [IU] via SUBCUTANEOUS
  Administered 2019-12-15: 1 [IU] via SUBCUTANEOUS
  Administered 2019-12-16: 2 [IU] via SUBCUTANEOUS
  Administered 2019-12-16 (×2): 1 [IU] via SUBCUTANEOUS
  Administered 2019-12-17 – 2019-12-18 (×3): 2 [IU] via SUBCUTANEOUS

## 2019-12-14 NOTE — H&P (Addendum)
Randall Frazier RVU:[id number] DOB: 12/08/1955 DOA: 12/14/2019     PCP: Gaynelle Arabian, MD   Outpatient Specialists:     GI at [facility]    Patient arrived to ER on 12/14/19 at 1455 Referred by Attending Lucrezia Starch, MD   Patient coming from: home Lives With family    Chief Complaint:  Chief Complaint  Patient presents with  . GI Bleeding    HPI: Randall Frazier is a 64 y.o. male with medical history significant of Cirrhosis of liver, DM2, CAD sp CABG, brain mass benigh    Presented with 3 day of confusion. Noted tarry stools today. His legs have been sweling up and red Right  seemed more red He has noted areas of red discoloration on his skin (spider angiomas) for a long time but did not know what they meant.  He has been told in the past he has fatty liver disease. He was followed for that for years. Denies any severe cholesterol problems.  He only drinks about 2 beers a week. His wife has had hepatitis C and was treated he was tested for this and was negative.   few weeks ago he was told he had cirrhosis based on results of ultrasound.  Since then he was started on Lasix to see if that would help with his swelling. Until now that he started to get confused and that prompted his visit to emergency department. Patient also endorses melena for at least 24 hours.  His urine has turned dark in his skin has turned yellow.  This has alarmed his wife. Patient had a fall today where he misstepped and fell down.  He denies any syncope. Per wife he has been having some shortness of breath with ambulation for quite some time. 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 Infectious risk factors:  Reports shortness of breath,   Diarrhea/abdominal pain,    severe fatigue Reports known  sick contacts, known COVID 19 exposure, inability to completely isolate   Has   been fully vaccinated against COVID    Initial COVID TEST  NEGATIVE   Lab Results  Component Value Date   [place] NEGATIVE 12/14/2019    Regarding pertinent Chronic problems:     Hyperlipidemia -  on statins Lipitor  Lipid Panel  No results found for: CHOL, TRIG, HDL, CHOLHDL, VLDL, LDLCALC, LDLDIRECT, LABVLDL     CAD  - On Aspirin, statin, betablocker                            Status post CABG   DM 2  on   PO meds only   Gout on allopurinol    obesity-   BMI Readings from Last 1 Encounters:  12/16/12 36.50 kg/m     Liver disease MELD-Na score: 27 at 12/14/2019 10:00 PM     While in ER: Noted to have evidence of liver failure elevated INR up to 2.0 platelet count down to 59 Albumin 2.6 sodium 129 Noted to have elevated ammonia in the 50s.  ER Provider Called: GI   Dr. Watt Climes They Recommend admit to medicine   Will see in AM   Hospitalist was called for admission for upper GI bleed in  the setting of severe liver disease/liver failure  The following Work up has been ordered so far:  Orders Placed This Encounter  Procedures  . SARS Coronavirus 2 by RT PCR (hospital order, performed in [facility] hospital lab) Nasopharyngeal Nasopharyngeal Swab  . US Abdomen Limited  . CT Head Wo Contrast  . CBC  . Comprehensive metabolic panel  . Protime-INR  . Ammonia  . Hepatic function panel  . Urinalysis, Routine w reflex microscopic  . Hepatitis panel, acute  . Cardiac monitoring  . Consult to gastroenterology  ALL PATIENTS BEING ADMITTED/HAVING PROCEDURES NEED COVID-19 SCREENING  . Consult to hospitalist  ALL PATIENTS BEING ADMITTED/HAVING PROCEDURES NEED COVID-19 SCREENING  . POC occult blood, ED  . Type and screen [place]  . ABO/Rh     Following Medications were ordered in ER: Medications - No data to display      Consult Orders  (From admission, onward)          Start     Ordered   12/14/19 2254  Consult to hospitalist  ALL PATIENTS BEING ADMITTED/HAVING PROCEDURES NEED COVID-19 SCREENING Paged by Starleen Blue  Once       Comments: ALL PATIENTS BEING ADMITTED/HAVING PROCEDURES NEED COVID-19 SCREENING  Provider:  (Not yet assigned)  Question Answer Comment  Place call to: [facility]   Reason for Consult Admit      12/14/19 2253           Significant initial  Findings: Abnormal Labs Reviewed  CBC - Abnormal; Notable for the following components:      Result Value   WBC 11.6 (*)    RDW 17.1 (*)    Platelets 59 (*)    All other components within normal limits  COMPREHENSIVE METABOLIC PANEL - Abnormal; Notable for the following components:   Sodium 129 (*)    CO2 21 (*)    Calcium 8.3 (*)    Albumin 2.7 (*)    AST 152 (*)    Total Bilirubin 6.3 (*)    All other components within normal limits  PROTIME-INR - Abnormal; Notable for the following components:   Prothrombin Time 21.5 (*)    INR 2.0 (*)    All other components within normal limits  AMMONIA - Abnormal; Notable for the following components:   Ammonia 54 (*)    All other components within normal limits  HEPATIC FUNCTION PANEL - Abnormal; Notable for the following components:   Albumin 2.6 (*)    AST 158 (*)    Total Bilirubin 6.6 (*)    Bilirubin, Direct 1.4 (*)    Indirect Bilirubin 5.2 (*)    All other components within normal limits  URINALYSIS, ROUTINE W REFLEX MICROSCOPIC - Abnormal; Notable for the following components:   Color, Urine AMBER (*)    APPearance HAZY (*)    Hgb urine dipstick LARGE (*)    Protein, ur 30 (*)    RBC / HPF >50 (*)    Bacteria, UA RARE (*)    All other components within normal limits    Otherwise labs showing:    Recent Labs  Lab 12/14/19 1625  NA 129*  K 4.2  CO2 21*  GLUCOSE 99  BUN 19  CREATININE 1.24  CALCIUM 8.3*    Cr    Up from baseline see below Lab Results  Component Value Date   CREATININE 1.24  12/14/2019   CREATININE 0.90 09/03/2014   CREATININE 0.85 09/03/2014  Recent Labs  Lab 12/14/19 1625 12/14/19 2200  AST 152* 158*  ALT 34 34  ALKPHOS 66 57  BILITOT 6.3* 6.6*  PROT 7.4 7.9  ALBUMIN 2.7* 2.6*   Lab Results  Component Value Date   CALCIUM 8.3 (L) 12/14/2019      WBC      Component Value Date/Time   WBC 11.6 (H) 12/14/2019 1625   ANC No results found for: NEUTROABS ALC No components found for: LYMPHAB    Plt: Lab Results  Component Value Date   PLT 59 (L) 12/14/2019          HG/HCT  Stable,     Component Value Date/Time   HGB 16.0 12/14/2019 1625   HCT 45.9 12/14/2019 1625    No results for input(s): LIPASE, AMYLASE in the last 168 hours. Recent Labs  Lab 12/14/19 1625  AMMONIA 54*        ECG: Ordered Personally reviewed by me showing: HR : 67 Rhythm:  NSR    no evidence of ischemic changes QTC 452      UA no evidence of UTI     Urine analysis:    Component Value Date/Time   COLORURINE AMBER (A) 12/14/2019 2200   APPEARANCEUR HAZY (A) 12/14/2019 2200   LABSPEC 1.020 12/14/2019 2200   PHURINE 5.0 12/14/2019 2200   GLUCOSEU NEGATIVE 12/14/2019 2200   HGBUR LARGE (A) 12/14/2019 2200   BILIRUBINUR NEGATIVE 12/14/2019 2200   KETONESUR NEGATIVE 12/14/2019 2200   PROTEINUR 30 (A) 12/14/2019 2200   UROBILINOGEN 1.0 12/16/2012 1114   NITRITE NEGATIVE 12/14/2019 2200   LEUKOCYTESUR NEGATIVE 12/14/2019 2200      Ordered  CT HEAD  NON acute  CXR -  NON acute [place] RUQ Sludge and gallstone without sonographic evidence for acute Cholecystitis  Suspected liver cirrhosis    ED Triage Vitals  Enc Vitals Group     BP 12/14/19 1533 (!) 164/69     Pulse Rate 12/14/19 1533 71     Resp 12/14/19 1533 18     Temp 12/14/19 1533 98.5 F (36.9 C)     Temp Source 12/14/19 1533 Oral     SpO2 12/14/19 1533 92 %     Weight --      Height --      Head Circumference --      Peak Flow --      Pain Score 12/14/19 1613 0     Pain Loc --       Pain Edu? --      Excl. in [place]? --   TMAX(24)@       Latest  Blood pressure (!) 163/77, pulse 68, temperature 98.4 F (36.9 C), temperature source Oral, resp. rate 19, SpO2 99 %.       Review of Systems:    Pertinent positives include:   Fatigue,melena, jaundice  rash or lesions.   Constitutional:  No weight loss, night sweats, Fevers, chills,  weight loss  HEENT:  No headaches, Difficulty swallowing,Tooth/dental problems,Sore throat,  No sneezing, itching, ear ache, nasal congestion, post nasal drip,  Cardio-vascular:  No chest pain, Orthopnea, PND, anasarca, dizziness, palpitations.no Bilateral lower extremity swelling  GI:  No heartburn, indigestion, abdominal pain, nausea, vomiting, diarrhea, change in bowel habits, loss of appetite,  blood in stool, hematemesis Resp:  no shortness of breath at rest. No dyspnea on exertion, No excess mucus, no productive cough, No non-productive cough, No coughing up of blood.No change in color of mucus.No wheezing. Skin:  no GU:  no dysuria, change in color of urine, no urgency or frequency. No straining to urinate.  No flank pain.  Musculoskeletal:  No joint pain or no joint swelling. No decreased range of motion. No back pain.  Psych:  No change in mood or affect. No depression or anxiety. No memory loss.  Neuro: no localizing neurological complaints, no tingling, no weakness, no double vision, no gait abnormality, no slurred speech, no confusion  All systems reviewed and apart from [place] all are negative  Past Medical History:   Past Medical History:  Diagnosis Date  . Arthritis    shoulder- both, neck, spine, GOUT  . Diabetes mellitus   . Gout   . H/O exercise stress test    many yrs. ago, no need for f/u with cardiac   . Hypertension   . Pneumonia    while in [employer], [id number]   . Weight loss 2011   100 lbs. weight loss -over 18 mon. period       Past Surgical History:  Procedure Laterality Date  . ANTERIOR  CERVICAL DECOMP/DISCECTOMY FUSION N/A 12/20/2012   Procedure: C5-6, C6-7 ANTERIOR CERVICAL DISCECTOMY AND FUSION, ALLOGRAFT, PLATE;  Surgeon: Marybelle Killings, MD;  Location: [place];  Service: Orthopedics;  Laterality: N/A;  . 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 He has never used smokeless tobacco. He reports current alcohol use. He reports that he does not use drugs.   Family History:   Family History  Problem Relation Age of Onset  . Liver disease Sister   . Diabetes Neg Hx     Allergies: Allergies  Allergen Reactions  . Pork-Derived Products Other (See Comments)    Cannot eat- gout  . Statins Other (See Comments)    Cramps   . Trazodone And Nefazodone Nausea Only and Other (See Comments)    Caused patient to have a lowered level of tolerance, too  . Lisinopril Rash  . Niacin Rash  . Niacin And Related Rash     Prior to Admission medications   Medication Sig Start Date End Date Taking? Authorizing Provider  acetaminophen (TYLENOL) 500 MG tablet Take 1,000 mg by mouth every 8 (eight) hours as needed (for pain).    Yes [provider]  allopurinol (ZYLOPRIM) 100 MG tablet Take 100 mg by mouth as needed (AS DIRECTED, FOR GOUT FLARES).    Yes [provider]  aspirin EC 81 MG tablet Take 81 mg by mouth daily.   Yes [provider]  Bacitracin-Polymyxin B (SM DOUBLE ANTIBIOTIC) 500-10000 UNIT/GM OINT Apply 1 application topically See admin instructions. Apply to affected area of right leg 2 times a day 12/02/19  Yes [provider]  Cholecalciferol (VITAMIN D3) 50 MCG (2000 UT) TABS Take 2,000 Units by mouth daily.   Yes [provider]  diphenhydrAMINE (BENADRYL) 25 mg capsule Take 25 mg by mouth at bedtime.   Yes [provider]  fish oil-omega-3 fatty acids 1000 MG capsule Take 1,000 mg by mouth daily.    Yes [provider]  loratadine (CLARITIN) 10 MG tablet Take 10 mg by mouth daily as needed for allergies or rhinitis.   Yes [provider]  LUBRICATING PLUS EYE DROPS 0.5 % SOLN Place 1 drop into both eyes 3 (three) times daily as needed (for irritation).  10/05/19  Yes [provider]  meloxicam (MOBIC) 15 MG tablet Take 15 mg by mouth at bedtime as needed for pain.    Yes [provider]  metFORMIN (GLUCOPHAGE) 1000 MG tablet Take 1,000 mg by mouth 2 (two) times daily with a meal.   Yes [provider]  metoprolol tartrate (LOPRESSOR) 50 MG tablet Take 50 mg by mouth 2 (two) times daily.   Yes [provider]  montelukast (SINGULAIR) 10 MG tablet Take 10 mg by mouth at bedtime.   Yes [provider]  spironolactone (ALDACTONE) 25 MG tablet Take 25 mg by mouth 2 (two) times daily.   Yes [provider]  testosterone cypionate (DEPOTESTOTERONE CYPIONATE) 200 MG/ML injection Inject into the muscle every 14 (fourteen) days.    Yes [provider]  THERAPEUTIC SHAMPOO 0.5 % shampoo Apply 1 application topically See admin instructions. Shampoo at least 4 times a week 10/05/19  Yes [provider]  vitamin E (VITAMIN E) 180 MG (400 UNITS) capsule Take 400 Units by mouth daily.   Yes [provider]  atorvastatin (LIPITOR) 20 MG tablet Take 20 mg by mouth at bedtime.  Patient not taking: Reported on 12/14/2019    [provider]  cyclobenzaprine (FLEXERIL) 10 MG tablet Take 10 mg by mouth at bedtime. Take one every night Patient not taking: Reported on 12/14/2019    [provider]  metFORMIN (GLUCOPHAGE) 500 MG tablet Take 500 mg by mouth 4 (four) times daily. Take 4 times a day per patient Patient not taking: Reported on 12/14/2019    [provider]  methocarbamol (ROBAXIN) 500 MG tablet Take 1 tablet (500 mg total) by mouth every 6 (six) hours as needed (spasm). Patient not taking: Reported on  12/14/2019 12/20/12   Phillips Hay, PA-C  oxyCODONE-acetaminophen (ROXICET) 5-325 MG per tablet Take 1-2 tablets by mouth every 4 (four) hours as needed for pain. Patient not taking: Reported on 12/14/2019 12/20/12   Phillips Hay, PA-C  traZODone (DESYREL) 150 MG tablet Take 150 mg by mouth at bedtime. Patient not taking: Reported on 12/14/2019    [provider]   Physical Exam: Vitals with BMI 12/14/2019 12/14/2019 12/14/2019  Height - - -  Weight - - -  BMI - - -  Systolic 497 026 378  Diastolic 77 75 70  Pulse 68 65 66     1. General:  in No Acute distress   Chronically ill -appearing 2. Psychological: Alert and   Oriented 3. Head/ENT:    Dry Mucous Membranes                          Head Non traumatic, neck supple                            Poor Dentition 4. SKIN:   decreased Skin turgor,  Skin clean Dry and multiple petechiae.  Right lower extremity red indurated and hot 5. Heart: Regular rate and rhythm no  Murmur, no Rub or gallop 6. Lungs:   no wheezes or crackles   7. Abdomen: Soft, non-tender,   distended  obese  bowel sounds present 8. Lower extremities: no clubbing, cyanosis, trace edema 9. Neurologically Grossly intact, moving all 4 extremities equally no asterixis 10. MSK: Normal range of motion   All other LABS:     Recent Labs  Lab 12/14/19 1625  WBC 11.6*  HGB  16.0  HCT 45.9  MCV 97.7  PLT 59*     Recent Labs  Lab 12/14/19 1625  NA 129*  K 4.2  CL 101  CO2 21*  GLUCOSE 99  BUN 19  CREATININE 1.24  CALCIUM 8.3*     Recent Labs  Lab 12/14/19 1625 12/14/19 2200  AST 152* 158*  ALT 34 34  ALKPHOS 66 57  BILITOT 6.3* 6.6*  PROT 7.4 7.9  ALBUMIN 2.7* 2.6*       Cultures: No results found for: SDES, SPECREQUEST, CULT, REPTSTATUS   Radiological Exams on Admission: CT Head Wo Contrast  Result Date: 12/14/2019 CLINICAL DATA:  Mental status change EXAM: CT HEAD WITHOUT CONTRAST TECHNIQUE: Contiguous axial images were obtained from the  base of the skull through the vertex without intravenous contrast. COMPARISON:  CT brain 09/03/2014 FINDINGS: Brain: No acute territorial infarction, hemorrhage or intracranial mass. The ventricles are nonenlarged. Vascular: No hyperdense vessels. Scattered carotid vascular calcification Skull: Normal. Negative for fracture or focal lesion. Sinuses/Orbits: No acute finding. Other: None IMPRESSION: Negative non contrasted CT appearance of the brain. Electronically Signed   By: Donavan Foil M.D.   On: 12/14/2019 23:23   US Abdomen Limited  Result Date: 12/14/2019 CLINICAL DATA:  Abdomen pain EXAM: ULTRASOUND ABDOMEN LIMITED RIGHT UPPER QUADRANT COMPARISON:  Ultrasound 09/03/2012 FINDINGS: Gallbladder: Small amount of gallbladder sludge. Shadowing stone measuring up to 1.6 cm. Normal wall thickness. Negative sonographic [place]. Common bile duct: Diameter: 6 mm Liver: Liver is slightly echogenic. Contour nodularity of the liver. Portal vein is patent on color Doppler imaging with normal direction of blood flow towards the liver. Other: None. IMPRESSION: 1. Sludge and gallstone without sonographic evidence for acute cholecystitis 2. Suspected liver cirrhosis Electronically Signed   By: Donavan Foil M.D.   On: 12/14/2019 21:37   DG CHEST PORT 1 VIEW  Result Date: 12/15/2019 CLINICAL DATA:  GI bleeding EXAM: PORTABLE CHEST 1 VIEW COMPARISON:  04/19/2015 FINDINGS: Hardware in the cervical spine. Post sternotomy changes. Streaky atelectasis left base. No consolidation or pleural effusion. Borderline cardiac size. No pneumothorax. Stable calcified lung nodule in the right upper lobe. IMPRESSION: Streaky atelectasis left base. Electronically Signed   By: Donavan Foil M.D.   On: 12/15/2019 00:34    Chart has been reviewed    Assessment/Plan  64 y.o. male with medical history significant of Cirrhosis of liver, DM2, CAD sp CABG, brain mass benigh  Admitted for upper GI bleed in the setting of liver failure due  to cirrhosis of unknown etiology  Present on Admission: . Melena -likely Upper GI bleed -  - [place] score BUN >18.2     Melena hepatic disease    >1 Justifies admission and aggressive management       Modifying risk factors include:     NSAIDS use   liver disease        -    AIMS 65 = Alb <3,  INR >1.5 Mental status change TOTAL of 3        Worrisome        -  ER  Provider spoke to gastroenterology Sadie Haber ) they will see patient in a.m. appreciate their consult   - serial CBC.    - Monitor for any recurrence,  evidence of hemodynamic instability or significant blood loss  - Transfuse as needed for hemoglobin below 7 or evidence of life-threatening bleeding  - Establish at least 2 PIV and fluid resuscitate   - keep  nothing by mouth post midnight,   -  administer Protonix  drip   -  Administer octreotide given possibility of variceal bleeding,    -  Cannot rule out  Variceal bleed initiate antibiotics   IV Cefrtiaxone  Give vitamin K  . Decompensated hepatic cirrhosis ([place]) -appreciate GI consult.  Patient will need further work-up possible liver biopsy.  Check hepatitis serologies acetaminophen level, may benefit from ultrasound with Dopplers in a.m. to evaluate for portal vein thrombosis but somewhat less likely having no abdominal pain. Will need further work-up regarding cause Check lipid panel Check ANA Ceruloplasmin level Currently meld score 27 very worrisome poor prognosis at this time Around 19% 32-monthmortality   . CAD (coronary artery disease) -holding aspirin given possible upper GI bleeding, Continue statin and beta-blocker for now patient appears to be hypertensive  . Hyperlipemia -continue statin may need to adjust hepatic vein   . Essential hypertension -hold Lasix and spironolactone given AKI if blood pressure allows continue metoprolol   . Dyspnea -check echogram to assess for possible cardiac etiology of liver disease Although much less likely    . Thrombocytopenia ([place] most likely secondary to liver disease.  Given suspected upper GI bleed will transfuse 1 unit and follow  . AKI (acute kidney injury) ([place] -hold Lasix and spironolactone gently rehydrate administer albumin evaluate for possible hepatorenal syndrome if does not respond  . Hypoalbuminemia -administer albumin to see what would help her renal function   . Acute hepatic encephalopathy -initiate lactulose follow ammonia level in the morning  . Hyponatremia -in the setting of liver disease.  Obtain urine electrolytes  Right leg Cellulitis - -admit per cellulitis protocol will       continue current antibiotic choice Rocephin        Will obtain MRSA screening,           further antibiotic adjustment pending above results   DM2 -  - Order Sensitive   SSI     -  check TSH and HgA1C  - Hold by mouth medications    Other plan as per orders.  DVT prophylaxis:  SCD     Code Status:    Code Status: Not on file FULL CODE   as per patient   I had personally discussed CODE STATUS with patient and family    Family Communication:   Family  at  Bedside  plan of care was discussed  with   Wife   Disposition Plan:     To home once workup is complete and patient is stable   Following barriers for discharge:                            Electrolytes corrected                                                          Will need to be able to tolerate PO                            Will likely need home health,  Will need consultants to evaluate patient prior to discharge                     Would benefit from PT/OT eval prior to DC  Ordered                    Consults called:    [facility] aware will see in AM  Admission status:  ED Disposition    ED Disposition Condition [place]: [place] [[id number]]  Level of Care: Telemetry Medical [[id number]]  May admit patient to [facility] or [facility] if equivalent  level of care is available:: No  Covid Evaluation: Asymptomatic Screening Protocol (No Symptoms)  Diagnosis: Melena [[id number]]  Admitting Physician: Toy Baker [[id number]]  Attending Physician: Toy Baker [[id number]]  Estimated length of stay: past midnight tomorrow  Certification:: I certify this patient will need inpatient services for at least 2 midnights        inpatient     I Expect 2 midnight stay secondary to severity of patient's current illness need for inpatient interventions justified by the following:  Severe lab/radiological/exam abnormalities including:   Upper GI bleed in the setting of thrombocytopenia and extensive comorbidities including:  DM2   CAD   liver disease .    That are currently affecting medical management.   I expect  patient to be hospitalized for 2 midnights requiring inpatient medical care.  Patient is at high risk for adverse outcome (such as loss of life or disability) if not treated.  Indication for inpatient stay as follows:  Severe change from baseline regarding mental status     inability to maintain oral hydration    Need for operative/procedural  intervention   Need for IV antibiotics, IV fluids IV PPI blood product transfusion    Level of care     tele  For  24H         Lab Results  Component Value Date   [place] NEGATIVE 12/14/2019     Precautions: admitted as asymptomatic screening protocol  PPE: Used by the provider:   P100  eye Goggles,  Gloves    Tessica Cupo 12/15/2019, 12:41 AM    [facility]     after 2 AM please page floor coverage PA If 7AM-7PM, please contact the day team taking care of the patient using [email or web address]   Patient was evaluated in the context of the global COVID-19 pandemic, which necessitated consideration that the patient might be at risk for infection with the SARS-CoV-2 virus that causes COVID-19. Institutional protocols and algorithms that pertain to the evaluation of  patients at risk for COVID-19 are in a state of rapid change based on information released by regulatory bodies including the CDC and federal and state organizations. These policies and algorithms were followed during the patient's care.

## 2019-12-14 NOTE — ED Triage Notes (Signed)
Pt accompanied by wife, who reports pt began falling over the weekend and has increased confusion over the last 2 days. Hx cirrhosis. Wife also reports hematuria and dark tarry stools. Verbal orders for labs from Dr. Roslynn Amble.

## 2019-12-14 NOTE — ED Notes (Signed)
Pt transported to US

## 2019-12-14 NOTE — ED Notes (Signed)
Pt transported to CT ?

## 2019-12-14 NOTE — ED Provider Notes (Signed)
[facility] EMERGENCY DEPARTMENT Provider Note   CSN: [id number] Arrival date & time: 12/14/19  1455     History Chief Complaint  Patient presents with  . GI Bleeding    Randall Frazier is a 64 y.o. male.  Reports history of diabetes, [facility] nonalcoholic [facility].  States over the last couple weeks has had some increased confusion, wife has noticed yellowing of his skin over the past couple days.  Patient has had a couple of dark stools.  No bright red blood.  No abdominal pain or distention.  No fevers.  No nausea or vomiting.  Denies history of alcohol abuse.  Patient has mild confusion but able to answer most questions appropriately, wife provides additional history.  HPI     Past Medical History:  Diagnosis Date  . Arthritis    shoulder- both, neck, spine, GOUT  . Diabetes mellitus   . Gout   . H/O exercise stress test    many yrs. ago, no need for f/u with cardiac   . Hypertension   . Pneumonia    while in [employer], [id number]   . Weight loss 2011   100 lbs. weight loss -over 18 mon. period     Patient Active Problem List   Diagnosis Date Noted  . Cervical spondylosis without myelopathy 12/20/2012    Class: Diagnosis of  . Biliary dyskinesia 10/05/2012    Past Surgical History:  Procedure Laterality Date  . ANTERIOR CERVICAL DECOMP/DISCECTOMY FUSION N/A 12/20/2012   Procedure: C5-6, C6-7 ANTERIOR CERVICAL DISCECTOMY AND FUSION, ALLOGRAFT, PLATE;  Surgeon: Marybelle Killings, MD;  Location: [facility];  Service: Orthopedics;  Laterality: N/A;  . 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  Social History   Tobacco Use  . Smoking status: Never Smoker  Substance Use Topics  . Alcohol use: Yes    Comment: 2 beers / year- very rare  . Drug use: No    Home Medications Prior to Admission medications   Medication Sig Start Date End Date Taking? Authorizing Provider   acetaminophen (TYLENOL) 500 MG tablet Take 1,000 mg by mouth every 8 (eight) hours as needed (for pain).    Yes [provider]  allopurinol (ZYLOPRIM) 100 MG tablet Take 100 mg by mouth as needed (AS DIRECTED, FOR GOUT FLARES).    Yes [provider]  aspirin EC 81 MG tablet Take 81 mg by mouth daily.   Yes [provider]  Bacitracin-Polymyxin B (SM DOUBLE ANTIBIOTIC) 500-10000 UNIT/GM OINT Apply 1 application topically See admin instructions. Apply to affected area of right leg 2 times a day 12/02/19  Yes [provider]  Cholecalciferol (VITAMIN D3) 50 MCG (2000 UT) TABS Take 2,000 Units by mouth daily.   Yes [provider]  diphenhydrAMINE (BENADRYL) 25 mg capsule Take 25 mg by mouth at bedtime.   Yes [provider]  fish oil-omega-3 fatty acids 1000 MG capsule Take 1,000 mg by mouth daily.    Yes [provider]  loratadine (CLARITIN) 10 MG tablet Take 10 mg by mouth daily as needed for allergies or rhinitis.   Yes [provider]  LUBRICATING PLUS EYE DROPS 0.5 % SOLN Place 1 drop into both eyes 3 (three) times daily as needed (for irritation).  10/05/19  Yes [provider]  meloxicam (MOBIC) 15 MG tablet Take 15 mg  by mouth at bedtime as needed for pain.    Yes [provider]  metFORMIN (GLUCOPHAGE) 1000 MG tablet Take 1,000 mg by mouth 2 (two) times daily with a meal.   Yes [provider]  metoprolol tartrate (LOPRESSOR) 50 MG tablet Take 50 mg by mouth 2 (two) times daily.   Yes [provider]  montelukast (SINGULAIR) 10 MG tablet Take 10 mg by mouth at bedtime.   Yes [provider]  spironolactone (ALDACTONE) 25 MG tablet Take 25 mg by mouth 2 (two) times daily.   Yes [provider]  testosterone cypionate (DEPOTESTOTERONE CYPIONATE) 200 MG/ML injection Inject into the muscle every 14 (fourteen) days.    Yes [provider]  THERAPEUTIC SHAMPOO 0.5 %  shampoo Apply 1 application topically See admin instructions. Shampoo at least 4 times a week 10/05/19  Yes [provider]  vitamin E (VITAMIN E) 180 MG (400 UNITS) capsule Take 400 Units by mouth daily.   Yes [provider]  atorvastatin (LIPITOR) 20 MG tablet Take 20 mg by mouth at bedtime.  Patient not taking: Reported on 12/14/2019    [provider]  cyclobenzaprine (FLEXERIL) 10 MG tablet Take 10 mg by mouth at bedtime. Take one every night Patient not taking: Reported on 12/14/2019    [provider]  metFORMIN (GLUCOPHAGE) 500 MG tablet Take 500 mg by mouth 4 (four) times daily. Take 4 times a day per patient Patient not taking: Reported on 12/14/2019    [provider]  methocarbamol (ROBAXIN) 500 MG tablet Take 1 tablet (500 mg total) by mouth every 6 (six) hours as needed (spasm). Patient not taking: Reported on 12/14/2019 12/20/12   Phillips Hay, PA-C  oxyCODONE-acetaminophen (ROXICET) 5-325 MG per tablet Take 1-2 tablets by mouth every 4 (four) hours as needed for pain. Patient not taking: Reported on 12/14/2019 12/20/12   Phillips Hay, PA-C  traZODone (DESYREL) 150 MG tablet Take 150 mg by mouth at bedtime. Patient not taking: Reported on 12/14/2019    [provider]    Allergies    Pork-derived products, Statins, Trazodone and nefazodone, Lisinopril, Niacin, and Niacin and related  Review of Systems   Review of Systems  Constitutional: Positive for fatigue. Negative for chills and fever.  HENT: Negative for ear pain and sore throat.   Eyes: Negative for pain and visual disturbance.  Respiratory: Negative for cough and shortness of breath.   Cardiovascular: Negative for chest pain and palpitations.  Gastrointestinal: Positive for blood in stool, diarrhea and nausea. Negative for abdominal pain and vomiting.  Genitourinary: Negative for dysuria and hematuria.  Musculoskeletal: Negative for arthralgias and back pain.  Skin:  Negative for color change and rash.  Neurological: Negative for seizures and syncope.  Psychiatric/Behavioral: Positive for confusion.  All other systems reviewed and are negative.   Physical Exam Updated Vital Signs BP (!) 156/79   Pulse 69   Temp 98.4 F (36.9 C) (Oral)   Resp 19   SpO2 98%   Physical Exam Vitals and nursing note reviewed.  Constitutional:      Appearance: He is well-developed.     Comments: Somewhat chronically ill-appearing but no distress  HENT:     Head: Normocephalic and atraumatic.  Eyes:     General: Scleral icterus present.     Conjunctiva/sclera: Conjunctivae normal.  Cardiovascular:     Rate and Rhythm: Normal rate and regular rhythm.     Heart sounds: No murmur heard.   Pulmonary:  Effort: Pulmonary effort is normal. No respiratory distress.     Breath sounds: Normal breath sounds.  Abdominal:     Palpations: Abdomen is soft.     Tenderness: There is no abdominal tenderness.  Musculoskeletal:        General: No deformity or signs of injury.     Cervical back: Neck supple.     Comments: Mild edema in bilateral lower legs  Skin:    General: Skin is warm and dry.     Capillary Refill: Capillary refill takes less than 2 seconds.     Coloration: Skin is jaundiced.  Neurological:     Mental Status: He is alert.     ED Results / Procedures / Treatments   Labs (all labs ordered are listed, but only abnormal results are displayed) Labs Reviewed  CBC - Abnormal; Notable for the following components:      Result Value   WBC 11.6 (*)    RDW 17.1 (*)    Platelets 59 (*)    All other components within normal limits  COMPREHENSIVE METABOLIC PANEL - Abnormal; Notable for the following components:   Sodium 129 (*)    CO2 21 (*)    Calcium 8.3 (*)    Albumin 2.7 (*)    AST 152 (*)    Total Bilirubin 6.3 (*)    All other components within normal limits  PROTIME-INR - Abnormal; Notable for the following components:   Prothrombin Time  21.5 (*)    INR 2.0 (*)    All other components within normal limits  AMMONIA - Abnormal; Notable for the following components:   Ammonia 54 (*)    All other components within normal limits  HEPATIC FUNCTION PANEL - Abnormal; Notable for the following components:   Albumin 2.6 (*)    AST 158 (*)    Total Bilirubin 6.6 (*)    Bilirubin, Direct 1.4 (*)    Indirect Bilirubin 5.2 (*)    All other components within normal limits  URINALYSIS, ROUTINE W REFLEX MICROSCOPIC - Abnormal; Notable for the following components:   Color, Urine AMBER (*)    APPearance HAZY (*)    Hgb urine dipstick LARGE (*)    Protein, ur 30 (*)    RBC / HPF >50 (*)    Bacteria, UA RARE (*)    All other components within normal limits  SARS CORONAVIRUS 2 BY RT PCR (HOSPITAL ORDER, [facility])  HEPATITIS PANEL, ACUTE  LIPID PANEL  ACETAMINOPHEN LEVEL  POC OCCULT BLOOD, ED  TYPE AND SCREEN  ABO/RH    EKG None  Radiology US Abdomen Limited  Result Date: 12/14/2019 CLINICAL DATA:  Abdomen pain EXAM: ULTRASOUND ABDOMEN LIMITED RIGHT UPPER QUADRANT COMPARISON:  Ultrasound 09/03/2012 FINDINGS: Gallbladder: Small amount of gallbladder sludge. Shadowing stone measuring up to 1.6 cm. Normal wall thickness. Negative sonographic Murphy. Common bile duct: Diameter: 6 mm Liver: Liver is slightly echogenic. Contour nodularity of the liver. Portal vein is patent on color Doppler imaging with normal direction of blood flow towards the liver. Other: None. IMPRESSION: 1. Sludge and gallstone without sonographic evidence for acute cholecystitis 2. Suspected liver cirrhosis Electronically Signed   By: Donavan Foil M.D.   On: 12/14/2019 21:37    Procedures Procedures (including critical care time)  Medications Ordered in ED Medications - No data to display  ED Course  I have reviewed the triage vital signs and the nursing notes.  Pertinent labs & imaging results  that were available during my  care of the patient were reviewed by me and considered in my medical decision making (see chart for details).  Clinical Course as of Dec 13 2316  Wed Dec 14, 2019  2254 Discussed with May guide, recommends lactulose, hospitalist admission, further recommendations pending formal consultation and obtaining [facility] documents   [RD]  2255 Meld score 27, 23-monthmortality 20%   [RD]  2313 Doutova will admit   [RD]    Clinical Course User Index [RD] DLucrezia Starch MD   MDM Rules/Calculators/A&P                          64year old male presented to ER with concern for increased confusion, yellowing of skin and dark stools.  Blood work today concerning for liver failure, elevated INR, thrombocytopenia, profoundly elevated bilirubin.  Mostly indirect bilirubin.)  Ultrasound with some gallstones, no obstructive pathology, no acute cholecystitis.  Demonstrated cirrhosis of liver.  I reviewed case with Dr. MWatt Climeswith gastroenterology, recommends hospitalist admission, further recommendations pending formal consultation tomorrow morning.  Hospitalist will admit.  Final Clinical Impression(s) / ED Diagnoses Final diagnoses:  Abdominal pain  Liver failure without hepatic coma, unspecified chronicity (HCC)  Hyperammonemia (HCC)  Gastrointestinal hemorrhage, unspecified gastrointestinal hemorrhage type  Thrombocytopenia (HCC)  Bilirubinemia  Altered mental status, unspecified altered mental status type    Rx / DC Orders ED Discharge Orders    None       DLucrezia Starch MD 12/14/19 2318

## 2019-12-15 ENCOUNTER — Inpatient Hospital Stay (HOSPITAL_COMMUNITY): Payer: Medicare Other

## 2019-12-15 ENCOUNTER — Encounter (HOSPITAL_COMMUNITY): Payer: Self-pay | Admitting: Internal Medicine

## 2019-12-15 DIAGNOSIS — R0600 Dyspnea, unspecified: Secondary | ICD-10-CM | POA: Diagnosis present

## 2019-12-15 DIAGNOSIS — K7200 Acute and subacute hepatic failure without coma: Secondary | ICD-10-CM | POA: Diagnosis present

## 2019-12-15 DIAGNOSIS — I2510 Atherosclerotic heart disease of native coronary artery without angina pectoris: Secondary | ICD-10-CM | POA: Diagnosis present

## 2019-12-15 DIAGNOSIS — E119 Type 2 diabetes mellitus without complications: Secondary | ICD-10-CM

## 2019-12-15 DIAGNOSIS — E1169 Type 2 diabetes mellitus with other specified complication: Secondary | ICD-10-CM

## 2019-12-15 DIAGNOSIS — D696 Thrombocytopenia, unspecified: Secondary | ICD-10-CM | POA: Diagnosis present

## 2019-12-15 DIAGNOSIS — E871 Hypo-osmolality and hyponatremia: Secondary | ICD-10-CM | POA: Diagnosis present

## 2019-12-15 DIAGNOSIS — K7290 Hepatic failure, unspecified without coma: Secondary | ICD-10-CM | POA: Diagnosis present

## 2019-12-15 DIAGNOSIS — E785 Hyperlipidemia, unspecified: Secondary | ICD-10-CM | POA: Diagnosis present

## 2019-12-15 DIAGNOSIS — R079 Chest pain, unspecified: Secondary | ICD-10-CM

## 2019-12-15 DIAGNOSIS — K7682 Hepatic encephalopathy: Secondary | ICD-10-CM | POA: Diagnosis present

## 2019-12-15 DIAGNOSIS — E8809 Other disorders of plasma-protein metabolism, not elsewhere classified: Secondary | ICD-10-CM | POA: Diagnosis present

## 2019-12-15 DIAGNOSIS — I10 Essential (primary) hypertension: Secondary | ICD-10-CM | POA: Diagnosis present

## 2019-12-15 DIAGNOSIS — L03115 Cellulitis of right lower limb: Secondary | ICD-10-CM | POA: Diagnosis present

## 2019-12-15 DIAGNOSIS — N179 Acute kidney failure, unspecified: Secondary | ICD-10-CM | POA: Diagnosis present

## 2019-12-15 LAB — HIV ANTIBODY (ROUTINE TESTING W REFLEX): HIV Screen 4th Generation wRfx: NONREACTIVE

## 2019-12-15 LAB — COMPREHENSIVE METABOLIC PANEL
ALT: 33 U/L (ref 0–44)
AST: 137 U/L — ABNORMAL HIGH (ref 15–41)
Albumin: 2.5 g/dL — ABNORMAL LOW (ref 3.5–5.0)
Alkaline Phosphatase: 59 U/L (ref 38–126)
Anion gap: 12 (ref 5–15)
BUN: 19 mg/dL (ref 8–23)
CO2: 21 mmol/L — ABNORMAL LOW (ref 22–32)
Calcium: 8 mg/dL — ABNORMAL LOW (ref 8.9–10.3)
Chloride: 98 mmol/L (ref 98–111)
Creatinine, Ser: 1.17 mg/dL (ref 0.61–1.24)
GFR calc Af Amer: >60 mL/min (ref >60)
GFR calc non Af Amer: >60 mL/min (ref >60)
Glucose, Bld: 89 mg/dL (ref 70–99)
Potassium: 3.9 mmol/L (ref 3.5–5.1)
Sodium: 131 mmol/L — ABNORMAL LOW (ref 135–145)
Total Bilirubin: 5.5 mg/dL — ABNORMAL HIGH (ref 0.3–1.2)
Total Protein: 7 g/dL (ref 6.5–8.1)

## 2019-12-15 LAB — ECHOCARDIOGRAM COMPLETE
Area-P 1/2: 2.83 cm2
Height: 71 in
S' Lateral: 3.2 cm
Weight: 4550.29 oz

## 2019-12-15 LAB — AMMONIA: Ammonia: 49 umol/L — ABNORMAL HIGH (ref 9–35)

## 2019-12-15 LAB — CBC WITH DIFFERENTIAL/PLATELET
Abs Immature Granulocytes: 0.04 10*3/uL (ref 0.00–0.07)
Basophils Absolute: 0 10*3/uL (ref 0.0–0.1)
Basophils Relative: 0 %
Eosinophils Absolute: 0.2 10*3/uL (ref 0.0–0.5)
Eosinophils Relative: 2 %
HCT: 42.9 % (ref 39.0–52.0)
Hemoglobin: 14.9 g/dL (ref 13.0–17.0)
Immature Granulocytes: 0 %
Lymphocytes Relative: 11 %
Lymphs Abs: 1 10*3/uL (ref 0.7–4.0)
MCH: 33.3 pg (ref 26.0–34.0)
MCHC: 34.7 g/dL (ref 30.0–36.0)
MCV: 96 fL (ref 80.0–100.0)
Monocytes Absolute: 1.1 10*3/uL — ABNORMAL HIGH (ref 0.1–1.0)
Monocytes Relative: 12 %
Neutro Abs: 6.7 10*3/uL (ref 1.7–7.7)
Neutrophils Relative %: 75 %
Platelets: 58 10*3/uL — ABNORMAL LOW (ref 150–400)
RBC: 4.47 MIL/uL (ref 4.22–5.81)
RDW: 16.8 % — ABNORMAL HIGH (ref 11.5–15.5)
WBC: 9.1 10*3/uL (ref 4.0–10.5)
nRBC: 0 % (ref 0.0–0.2)

## 2019-12-15 LAB — LIPID PANEL
Cholesterol: 123 mg/dL (ref 0–200)
HDL: 16 mg/dL — ABNORMAL LOW (ref >40)
LDL Cholesterol: 84 mg/dL (ref 0–99)
Total CHOL/HDL Ratio: 7.7 RATIO
Triglycerides: 115 mg/dL (ref <150)
VLDL: 23 mg/dL (ref 0–40)

## 2019-12-15 LAB — SODIUM, URINE, RANDOM: Sodium, Ur: 32 mmol/L

## 2019-12-15 LAB — POC OCCULT BLOOD, ED: Fecal Occult Bld: NEGATIVE

## 2019-12-15 LAB — HEPATITIS PANEL, ACUTE
HCV Ab: NONREACTIVE
Hep A IgM: NONREACTIVE
Hep B C IgM: NONREACTIVE
Hepatitis B Surface Ag: NONREACTIVE

## 2019-12-15 LAB — OSMOLALITY, URINE: Osmolality, Ur: 717 mOsm/kg (ref 300–900)

## 2019-12-15 LAB — ACETAMINOPHEN LEVEL: Acetaminophen (Tylenol), Serum: 12 ug/mL (ref 10–30)

## 2019-12-15 LAB — PHOSPHORUS
Phosphorus: 2.9 mg/dL (ref 2.5–4.6)
Phosphorus: 3 mg/dL (ref 2.5–4.6)

## 2019-12-15 LAB — HEMOGLOBIN A1C
Hgb A1c MFr Bld: 6 % — ABNORMAL HIGH (ref 4.8–5.6)
Mean Plasma Glucose: 125.5 mg/dL

## 2019-12-15 LAB — ABO/RH: ABO/RH(D): A POS

## 2019-12-15 LAB — MAGNESIUM
Magnesium: 1.5 mg/dL — ABNORMAL LOW (ref 1.7–2.4)
Magnesium: 1.5 mg/dL — ABNORMAL LOW (ref 1.7–2.4)

## 2019-12-15 LAB — GLUCOSE, CAPILLARY
Glucose-Capillary: 93 mg/dL (ref 70–99)
Glucose-Capillary: 101 mg/dL — ABNORMAL HIGH (ref 70–99)
Glucose-Capillary: 186 mg/dL — ABNORMAL HIGH (ref 70–99)
Glucose-Capillary: 130 mg/dL — ABNORMAL HIGH (ref 70–99)

## 2019-12-15 LAB — BRAIN NATRIURETIC PEPTIDE: B Natriuretic Peptide: 100.8 pg/mL — ABNORMAL HIGH (ref 0.0–100.0)

## 2019-12-15 LAB — CBG MONITORING, ED: Glucose-Capillary: 98 mg/dL (ref 70–99)

## 2019-12-15 LAB — TSH: TSH: 1.557 u[IU]/mL (ref 0.350–4.500)

## 2019-12-15 LAB — PROTIME-INR
INR: 1.9 — ABNORMAL HIGH (ref 0.8–1.2)
Prothrombin Time: 21 seconds — ABNORMAL HIGH (ref 11.4–15.2)

## 2019-12-15 LAB — CREATININE, URINE, RANDOM: Creatinine, Urine: 183.57 mg/dL

## 2019-12-15 LAB — PREALBUMIN: Prealbumin: <5 mg/dL — ABNORMAL LOW (ref 18–38)

## 2019-12-15 LAB — FERRITIN: Ferritin: 277 ng/mL (ref 24–336)

## 2019-12-15 MED ORDER — SODIUM CHLORIDE 0.9% IV SOLUTION: Freq: Once | INTRAVENOUS | Status: AC

## 2019-12-15 MED ORDER — DIPHENHYDRAMINE HCL 25 MG PO CAPS
25.0000 mg | ORAL_CAPSULE | Freq: Once | ORAL | Status: AC
  Administered 2019-12-15: 25 mg via ORAL
  Filled 2019-12-15: qty 1

## 2019-12-15 MED ORDER — SODIUM CHLORIDE 0.9 % IV SOLN
50.0000 ug/h | INTRAVENOUS | Status: DC
  Administered 2019-12-15 – 2019-12-16 (×4): 50 ug/h via INTRAVENOUS
  Filled 2019-12-15 (×5): qty 1

## 2019-12-15 MED ORDER — ALBUMIN HUMAN 5 % IV SOLN
12.5000 g | Freq: Once | INTRAVENOUS | Status: AC
  Administered 2019-12-15: 12.5 g via INTRAVENOUS
  Filled 2019-12-15: qty 250

## 2019-12-15 MED ORDER — OCTREOTIDE LOAD VIA INFUSION
25.0000 ug | Freq: Once | INTRAVENOUS | Status: AC
  Administered 2019-12-15: 25 ug via INTRAVENOUS
  Filled 2019-12-15: qty 13

## 2019-12-15 MED ORDER — PHYTONADIONE 5 MG PO TABS
5.0000 mg | ORAL_TABLET | Freq: Once | ORAL | Status: AC
  Administered 2019-12-15: 5 mg via ORAL
  Filled 2019-12-15: qty 1

## 2019-12-15 MED ORDER — VITAMIN K1 10 MG/ML IJ SOLN
10.0000 mg | Freq: Once | INTRAVENOUS | Status: AC
  Administered 2019-12-15: 10 mg via INTRAVENOUS
  Filled 2019-12-15: qty 1

## 2019-12-15 MED ORDER — LACTULOSE 10 GM/15ML PO SOLN
30.0000 g | Freq: Three times a day (TID) | ORAL | Status: DC
  Administered 2019-12-15 – 2019-12-19 (×12): 30 g via ORAL
  Filled 2019-12-15 (×12): qty 45

## 2019-12-15 MED ORDER — ACETAMINOPHEN 325 MG PO TABS
650.0000 mg | ORAL_TABLET | Freq: Once | ORAL | Status: AC
  Administered 2019-12-15: 650 mg via ORAL
  Filled 2019-12-15: qty 2

## 2019-12-15 NOTE — ED Notes (Signed)
Pt has statin listed as allergies, pt states he gets bad cramps and breaks out in hives. Sandostatin held at this time.

## 2019-12-15 NOTE — ED Notes (Signed)
Informed consent obtained from pt for platelet transfusion. Digital form in pt chart.

## 2019-12-15 NOTE — ED Notes (Signed)
RN verified with pharmacy that Protonix and octreotide are compatible.

## 2019-12-15 NOTE — Plan of Care (Signed)

## 2019-12-15 NOTE — ED Notes (Signed)
Pt wife requested RN call her to notify when pt goes upstairs. RN attempted to call, no answer and mailbox is full.

## 2019-12-15 NOTE — Consult Note (Signed)
[facility] Consultation Note  Referring Provider: [facility] Primary Care Physician:  Gaynelle Arabian, MD  Reason for Consultation:  Blood in stool  HPI: Randall Frazier is a 64 y.o. 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  No abdominal pain, hematemesis, change in bowel habits.  Had colonoscopy over 10 years ago, is scheduled for another one as outpatient later this month.  No prior endoscopy.  No NSAIDs.     Past Medical History:  Diagnosis Date  . Arthritis    shoulder- both, neck, spine, GOUT  . Diabetes mellitus   . Gout   . H/O exercise stress test    many yrs. ago, no need for f/u with cardiac   . Hypertension   . Pneumonia    while in [employer], [id number]   . Weight loss 2011   100 lbs. weight loss -over 18 mon. period     Past Surgical History:  Procedure Laterality Date  . ANTERIOR CERVICAL DECOMP/DISCECTOMY FUSION N/A 12/20/2012   Procedure: C5-6, C6-7 ANTERIOR CERVICAL DISCECTOMY AND FUSION, ALLOGRAFT, PLATE;  Surgeon: Marybelle Killings, MD;  Location: [facility];  Service: Orthopedics;  Laterality: N/A;  . 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 Authorizing Provider  acetaminophen (TYLENOL) 500 MG tablet Take 1,000 mg by mouth every 8 (eight) hours as needed (for pain).    Yes [provider]  allopurinol (ZYLOPRIM) 100 MG tablet Take 100 mg by mouth as needed (AS DIRECTED, FOR GOUT FLARES).    Yes [provider]  aspirin EC 81 MG tablet Take 81 mg by mouth daily.   Yes [provider]  Bacitracin-Polymyxin B (SM DOUBLE ANTIBIOTIC) 500-10000 UNIT/GM OINT Apply 1 application topically See admin instructions. Apply to affected area of right leg 2 times a day 12/02/19  Yes [provider]  Cholecalciferol (VITAMIN D3) 50 MCG (2000 UT)  TABS Take 2,000 Units by mouth daily.   Yes [provider]  diphenhydrAMINE (BENADRYL) 25 mg capsule Take 25 mg by mouth at bedtime.   Yes [provider]  fish oil-omega-3 fatty acids 1000 MG capsule Take 1,000 mg by mouth daily.    Yes [provider]  loratadine (CLARITIN) 10 MG tablet Take 10 mg by mouth daily as needed for allergies or rhinitis.   Yes [provider]  LUBRICATING PLUS EYE DROPS 0.5 % SOLN Place 1 drop into both eyes 3 (three) times daily as needed (for irritation).  10/05/19  Yes [provider]  meloxicam (MOBIC) 15 MG tablet Take 15 mg by mouth at bedtime as needed for pain.    Yes [provider]  metFORMIN (GLUCOPHAGE) 1000 MG tablet Take 1,000 mg by mouth 2 (two) times daily with a meal.   Yes [provider]  metoprolol tartrate (LOPRESSOR) 50 MG tablet Take 50 mg by mouth 2 (two) times daily.   Yes [provider]  montelukast (SINGULAIR) 10 MG tablet Take 10 mg by mouth at bedtime.   Yes [provider]  spironolactone (ALDACTONE) 25 MG tablet Take 25 mg by mouth 2 (two) times daily.   Yes [provider]  testosterone cypionate (DEPOTESTOTERONE CYPIONATE) 200 MG/ML injection Inject into the muscle every 14 (fourteen) days.    Yes [provider]  THERAPEUTIC SHAMPOO 0.5 % shampoo Apply 1 application topically See admin instructions. Shampoo at least 4 times a week 10/05/19  Yes [provider]  vitamin E (VITAMIN E) 180 MG (400 UNITS) capsule Take 400 Units by mouth daily.   Yes [provider]  atorvastatin (LIPITOR) 20 MG tablet Take 20 mg by mouth at bedtime.  Patient not taking: Reported on 12/14/2019    [provider]  cyclobenzaprine (FLEXERIL) 10 MG tablet Take 10 mg by mouth at bedtime. Take one every night Patient not taking: Reported on 12/14/2019    [provider]  metFORMIN (GLUCOPHAGE) 500 MG tablet Take 500 mg by mouth 4  (four) times daily. Take 4 times a day per patient Patient not taking: Reported on 12/14/2019    [provider]  methocarbamol (ROBAXIN) 500 MG tablet Take 1 tablet (500 mg total) by mouth every 6 (six) hours as needed (spasm). Patient not taking: Reported on 12/14/2019 12/20/12   Phillips Hay, PA-C  oxyCODONE-acetaminophen (ROXICET) 5-325 MG per tablet Take 1-2 tablets by mouth every 4 (four) hours as needed for pain. Patient not taking: Reported on 12/14/2019 12/20/12   Phillips Hay, PA-C  traZODone (DESYREL) 150 MG tablet Take 150 mg by mouth at bedtime. Patient not taking: Reported on 12/14/2019    [provider]    Current Facility-Administered Medications  Medication Dose Route Frequency Provider Last Rate Last Admin  . 0.9 %  sodium chloride infusion   Intravenous Continuous Doutova, Anastassia, MD 75 mL/hr at 12/15/19 0700 Restarted at 12/15/19 0700  . cefTRIAXone (ROCEPHIN) 2 g in sodium chloride 0.9 % 100 mL IVPB  2 g Intravenous QHS Toy Baker, MD   Stopped at 12/15/19 0052  . insulin aspart (novoLOG) injection 0-9 Units  0-9 Units Subcutaneous Q4H Doutova, Anastassia, MD      . lactulose (CHRONULAC) 10 GM/15ML solution 30 g  30 g Oral TID Toy Baker, MD   30 g at 12/15/19 0905  . loratadine (CLARITIN) tablet 10 mg  10 mg Oral Daily PRN Doutova, Anastassia, MD      . metoprolol tartrate (LOPRESSOR) tablet 50 mg  50 mg Oral BID Toy Baker, MD   50 mg at 12/15/19 0905  . montelukast (SINGULAIR) tablet 10 mg  10 mg Oral QHS Doutova, Anastassia, MD      . octreotide (SANDOSTATIN) 500 mcg in sodium chloride 0.9 % 250 mL (2 mcg/mL) infusion  50 mcg/hr Intravenous Continuous Doutova, Anastassia, MD 25 mL/hr at 12/15/19 0414 50 mcg/hr at 12/15/19 0414  . ondansetron (ZOFRAN) tablet 4 mg  4 mg Oral Q6H PRN Toy Baker, MD       Or  . ondansetron (ZOFRAN) injection 4 mg  4 mg Intravenous Q6H PRN Doutova, Anastassia, MD      . pantoprazole  (PROTONIX) 80 mg in sodium chloride 0.9 % 100 mL (0.8 mg/mL) infusion  8 mg/hr Intravenous Continuous Doutova, Anastassia, MD 10 mL/hr at 12/15/19 0122 8 mg/hr at 12/15/19 0122  . [START ON 12/18/2019] pantoprazole (PROTONIX) injection 40 mg  40 mg Intravenous Q12H Doutova, Anastassia, MD      . sodium chloride flush (NS) 0.9 % injection 3 mL  3 mL Intravenous Q12H Doutova, Anastassia, MD      . spironolactone (ALDACTONE) tablet 25 mg  25 mg Oral BID Toy Baker, MD        Allergies as of 12/14/2019 - Review Complete 12/14/2019  Allergen Reaction Noted  . Pork-derived products Other (See Comments) 12/14/2019  .  Statins Other (See Comments) 03/06/2015  . Trazodone and nefazodone Nausea Only and Other (See Comments) 12/14/2019  . Lisinopril Rash 07/27/2012  . Niacin Rash 04/24/2015  . Niacin and related Rash 10/26/2011    Family History  Problem Relation Age of Onset  . Liver disease Sister   . Diabetes Neg Hx     Social History   Socioeconomic History  . Marital status: Married    Spouse name: Not on file  . Number of children: Not on file  . Years of education: Not on file  . Highest education level: Not on file  Occupational History  . Not on file  Tobacco Use  . Smoking status: Never Smoker  . Smokeless tobacco: Never Used  Substance and Sexual Activity  . Alcohol use: Yes    Comment: 2 beers / year- very rare  . Drug use: No  . Sexual activity: Not on file  Other Topics Concern  . Not on file  Social History Narrative  . Not on file   Social Determinants of Health   Financial Resource Strain:   . Difficulty of Paying Living Expenses:   Food Insecurity:   . Worried About Charity fundraiser in the Last Year:   . Arboriculturist in the Last Year:   Transportation Needs:   . Film/video editor (Medical):   Marland Kitchen Lack of Transportation (Non-Medical):   Physical Activity:   . Days of Exercise per Week:   . Minutes of Exercise per Session:   Stress:   .  Feeling of Stress :   Social Connections:   . Frequency of Communication with Friends and Family:   . Frequency of Social Gatherings with Friends and Family:   . Attends Religious Services:   . Active Member of Clubs or Organizations:   . Attends Archivist Meetings:   Marland Kitchen Marital Status:   Intimate Partner Violence:   . Fear of Current or Ex-Partner:   . Emotionally Abused:   Marland Kitchen Physically Abused:   . Sexually Abused:     Review of Systems: As per HPI, all others negative   Physical Exam: Vital signs in last 24 hours: Temp:  [98 F (36.7 C)-98.5 F (36.9 C)] 98.4 F (36.9 C) (08/05 0900) Pulse Rate:  [65-80] 78 (08/05 0900) Resp:  [12-23] 18 (08/05 0900) BP: (129-164)/(65-91) 129/67 (08/05 0900) SpO2:  [92 %-99 %] 93 % (08/05 0900) Weight:  [129 kg] 129 kg (08/05 0500) Last BM Date: 12/15/19 General:   Alert,  Well-developed, well-nourished, pleasant and cooperative in NAD Head:  Normocephalic and atraumatic. Eyes:  Sclera icteric,  Conjunctiva pink. Ears:  Normal auditory acuity. Nose:  No deformity, discharge,  or lesions. Mouth:  No deformity or lesions.  Oropharynx pink & moist. Neck:  Supple; no masses or thyromegaly. Lungs:  Clear throughout to auscultation.   No wheezes, crackles, or rhonchi. No acute distress. Heart:  Regular rate and rhythm; no murmurs, clicks, rubs,  or gallops. Abdomen:  Soft, nontender and nondistended. No masses, hepatosplenomegaly or hernias noted. Normal bowel sounds, without guarding, and without rebound.     Msk:  Symmetrical without gross deformities. Normal posture. Pulses:  Normal pulses noted. Extremities:  Without clubbing, + edema. Neurologic:  Alert and  oriented x4;  grossly normal neurologically. Skin:  Intact without significant lesions or rashes. Cervical Nodes:  No significant cervical adenopathy. Psych:  Alert and cooperative. Normal mood and affect.   Lab Results: Recent Labs  12/14/19 1625 12/15/19 0602   WBC 11.6* 9.1  HGB 16.0 14.9  HCT 45.9 42.9  PLT 59* 58*   BMET Recent Labs    12/14/19 1625 12/15/19 0602  NA 129* 131*  K 4.2 3.9  CL 101 98  CO2 21* 21*  GLUCOSE 99 89  BUN 19 19  CREATININE 1.24 1.17  CALCIUM 8.3* 8.0*   LFT Recent Labs    12/14/19 2200 12/14/19 2200 12/15/19 0602  PROT 7.9   < > 7.0  ALBUMIN 2.6*   < > 2.5*  AST 158*   < > 137*  ALT 34   < > 33  ALKPHOS 57   < > 59  BILITOT 6.6*   < > 5.5*  BILIDIR 1.4*  --   --   IBILI 5.2*  --   --    < > = values in this interval not displayed.   PT/INR Recent Labs    12/14/19 1625 12/15/19 0602  LABPROT 21.5* 21.0*  INR 2.0* 1.9*    Studies/Results: CT Head Wo Contrast  Result Date: 12/14/2019 CLINICAL DATA:  Mental status change EXAM: CT HEAD WITHOUT CONTRAST TECHNIQUE: Contiguous axial images were obtained from the base of the skull through the vertex without intravenous contrast. COMPARISON:  CT brain 09/03/2014 FINDINGS: Brain: No acute territorial infarction, hemorrhage or intracranial mass. The ventricles are nonenlarged. Vascular: No hyperdense vessels. Scattered carotid vascular calcification Skull: Normal. Negative for fracture or focal lesion. Sinuses/Orbits: No acute finding. Other: None IMPRESSION: Negative non contrasted CT appearance of the brain. Electronically Signed   By: Donavan Foil M.D.   On: 12/14/2019 23:23   US Abdomen Limited  Result Date: 12/14/2019 CLINICAL DATA:  Abdomen pain EXAM: ULTRASOUND ABDOMEN LIMITED RIGHT UPPER QUADRANT COMPARISON:  Ultrasound 09/03/2012 FINDINGS: Gallbladder: Small amount of gallbladder sludge. Shadowing stone measuring up to 1.6 cm. Normal wall thickness. Negative sonographic Murphy. Common bile duct: Diameter: 6 mm Liver: Liver is slightly echogenic. Contour nodularity of the liver. Portal vein is patent on color Doppler imaging with normal direction of blood flow towards the liver. Other: None. IMPRESSION: 1. Sludge and gallstone without sonographic  evidence for acute cholecystitis 2. Suspected liver cirrhosis Electronically Signed   By: Donavan Foil M.D.   On: 12/14/2019 21:37   DG CHEST PORT 1 VIEW  Result Date: 12/15/2019 CLINICAL DATA:  GI bleeding EXAM: PORTABLE CHEST 1 VIEW COMPARISON:  04/19/2015 FINDINGS: Hardware in the cervical spine. Post sternotomy changes. Streaky atelectasis left base. No consolidation or pleural effusion. Borderline cardiac size. No pneumothorax. Stable calcified lung nodule in the right upper lobe. IMPRESSION: Streaky atelectasis left base. Electronically Signed   By: Donavan Foil M.D.   On: 12/15/2019 00:34   Impression:  1.  Black stools.  GI bleed?  Normal Hgb. 2.  Elevated LFTs, consistent with cirrhosis; no biliary obstruction on ultrasound.  Does report to some alcohol use ("not a lot"). 3.  Coagulopathy and thrombocytopenia; consistent with liver injury + cirrhosis.  Plan:  1.  Needs IV vitamin K and bag of platelets today. 2.  Continue PPI. 3.  I don't see compelling evidence for variceal bleeding, or compelling indication for octreotide. 4.  Endoscopy planned for tomorrow; pending these findings, and clinical course, patient probably ok to proceed with colonoscopy electively as outpatient as had already been planned. 5.  [facility] will follow.   LOS: 1 day   Angelis Gates M  12/15/2019, 9:37 AM  Cell [phone] If no answer or after  5 PM call [phone]

## 2019-12-15 NOTE — Progress Notes (Signed)
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  Patient also noted skin discoloration to yellow with yellow sclera and increased edema to lower extremities.  Patient recently establish care with gastroenterology at the [facility] and was recently started on the spironolactone.  He reports previous liver biopsy in the [facility], roughly 1994 and does not report any significant findings.  In the ED, sodium 129, potassium 4.2, BUN 19, creatinine 1.24, AST 152, ALT 34, ammonia level 54.  Total bilirubin 6.3.  WBC 11.6, hemoglobin 16.0, platelets 59.  INR 2.0.  HEENT negative.  Chest x-ray with streaky atelectasis left base.  CT head without acute intracranial abnormality.  Case was discussed by ED physician with GI, Dr. Watt Climes about concern for upper GI bleed versus decompensated cirrhosis; recommended hospitalist admission with a GI to follow in the a.m.  Assessment & Plan:   Active Problems:   Melena   DM2 (diabetes mellitus, type 2) (HCC)   Decompensated hepatic cirrhosis (HCC)   CAD (coronary artery disease)   Hyperlipemia   Essential hypertension   Dyspnea   Thrombocytopenia (HCC)   AKI (acute kidney injury) ([place])   Hypoalbuminemia   Acute hepatic encephalopathy   Hyponatremia   Cellulitis of right leg   Upper GI bleed Patient presenting with 3-day history of reported dark tarry stools.  Hemoglobin 14.9 on admission with normal BUN.  Denies NSAID abuse, reports regular Tylenol use and occasional EtOH use.  Seems to have been recently diagnosed with cirrhosis.  FOBT negative.  Considerations are for gastric ulcer/gastritis versus esophageal  varices in the setting of cirrhosis. --[facility] following, appreciate assistance --holding home aspirin --Continue Protonix drip and octreotide --Pending EGD tomorrow --Continue to monitor hemoglobin q8h  Decompensated cirrhosis Hx NAFLD Recently established care with gastroenterology at the [facility] and started on spironolactone 25 mg p.o. twice daily.  Right upper quadrant ultrasound notes liver slightly echogenic with contour nodularity and portal vein patent on color Doppler imaging.  Acute hepatitis panel negative. --GI following as above --MELD-NA on admission = 27 which correlates with a 27-32% 90 day mortality --MDF = 32.2 --Receiving IV albumin today --Continue spironolactone 25 mg p.o. twice daily --Strict I's and O's and daily weights  Acute renal failure Patient presenting with elevated creatinine of 1.24 with this baseline 0.90 April 2016.  Etiology likely secondary to hepatorenal syndrome versus dehydration in the setting of recent spironolactone initiation. --Cr 1.24>1.17 --Continue to monitor renal function closely daily  Acute hepatic encephalopathy Patient presenting with 3-day progressive confusion.  CT head with no acute intracranial abnormality.  Ammonia level elevated at 54 on admission.  EtOH level less than 5. --Mentation appears to have returned back to his normal baseline --Continue lactulose 30g PO TID, titrate for 2-3 soft BMs daily  Right lower extremity cellulitis Patient with right lower extremity erythema.  WBC count 9.1. --Continue antibiotics with ceftriaxone --Follow CBC daily  Thrombocytopenia Platelets 58 on admission, likely function of poor synthetic function due to underlying cirrhosis.  Received vitamin K 5 mg p.o. and 1 unit platelets on admission. --Holding home aspirin --Transfuse 1 additional unit platelets today --Receiving additional vitamin K 10 mg  IV today --Follow CBC daily  Hyponatremia Sodium 129 on admission with urine osmolality  717, urine sodium 32, urine creatinine 183. --Na 129>131 --Monitor electrolytes daily  Essential hypertension --Continue metoprolol tartrate 50 mg p.o. twice daily  T2DM Hemoglobin A1c 6.0. On Metformin 1000 mg p.o. twice daily at home. --Hold oral hypoglycemics while inpatient --Insulin sliding scale for further coverage --CBG's before every meal/at bedtime  CAD s/p CABG Previously on atorvastatin which has been discontinued due to cirrhosis. --We will hold home aspirin in the setting of possible upper GI bleed --Echocardiogram: Pending  Hx Gout: On allopurinol outpatient.  Left sphenoid wing meningioma Follows with [facility] neurosurgery.  Status post gamma knife surgery on 08/03/2018.  Seen in 64-monthfollow-up last on 02/16/2019.  Follow-up MRI notable for no growth and no concerning changes and recommends repeat follow-up 1 year with repeat MRI.    DVT prophylaxis: SCD's Code Status: FULL Code Family Communication: None present at bedside this morning; updated patient spouse CArbie Cookeyvia telephone this afternoon  Disposition Plan:  Status is: Inpatient  Remains inpatient appropriate because:Persistent severe electrolyte disturbances, Altered mental status, Ongoing diagnostic testing needed not appropriate for outpatient work up, Unsafe d/c plan and IV treatments appropriate due to intensity of illness or inability to take PO   Dispo: The patient is from: Home              Anticipated d/c is to: Home              Anticipated d/c date is: 3 days              Patient currently is not medically stable to d/c.    Consultants:   [facility] - Dr. OPaulita Fujita Procedures:   none  Antimicrobials:   none    Subjective: Patient seen and examined bedside, ambulating back to the bed from the restroom.  No specific complaints at this time.  Was seen by GI this morning with plans for EGD tomorrow morning.  States was recently [place]  Denies any hematemesis.  Further denies headache, no visual changes, no chest pain, no palpitations, no shortness of breath, no abdominal pain, no weakness, no fatigue, no paresthesias.  No acute events overnight per nursing staff.  Objective: Vitals:   12/15/19 0443 12/15/19 0456 12/15/19 0500 12/15/19 0900  BP: 138/68 137/71  129/67  Pulse: 80 72  78  Resp: 16 16  18   Temp: 98.2 F (36.8 C) 98.2 F (36.8 C)  98.4 F (36.9 C)  TempSrc: Oral Oral  Oral  SpO2: 96% 98%  93%  Weight:   129 kg   Height:   5' 11"  (1.803 m)     Intake/Output Summary (Last 24 hours) at 12/15/2019 1229 Last data filed at 12/15/2019 0700 Gross per 24 hour  Intake 929.25 ml  Output --  Net 929.25 ml   Filed Weights   12/15/19 0500  Weight: 129 kg    Examination:  General exam: Appears calm and comfortable, chronically ill appearance HEENT: Mild scleral icterus, poor dentition Respiratory system: Clear to auscultation. Respiratory effort normal.  Oxygenating well on room air Cardiovascular system: S1 & S2 heard, RRR. No JVD, murmurs, rubs, gallops or clicks. No pedal edema. Gastrointestinal system: Abdomen is nondistended, soft and nontender. No organomegaly or masses felt. Normal bowel sounds heard. Central nervous system: Alert and oriented. No focal neurological deficits. Extremities: Symmetric 5 x 5 power. Skin: Mild  jaundice, bilateral 1+ pitting edema bilateral lower extremity, right lower extremity with petechia, ecchymosis and bruising Psychiatry: Judgement and insight appear normal. Mood & affect appropriate.     Data Reviewed: I have personally reviewed following labs and imaging studies  CBC: Recent Labs  Lab 12/14/19 1625 12/15/19 0602  WBC 11.6* 9.1  NEUTROABS  --  6.7  HGB 16.0 14.9  HCT 45.9 42.9  MCV 97.7 96.0  PLT 59* 58*   Basic Metabolic Panel: Recent Labs  Lab 12/14/19 1625 12/15/19 0602  NA 129* 131*  K 4.2 3.9  CL 101 98  CO2 21*  21*  GLUCOSE 99 89  BUN 19 19  CREATININE 1.24 1.17  CALCIUM 8.3* 8.0*  MG  --  1.5*   1.5*  PHOS  --  3.0   2.9   GFR: Estimated Creatinine Clearance: 87.3 mL/min (by C-G formula based on SCr of 1.17 mg/dL). Liver Function Tests: Recent Labs  Lab 12/14/19 1625 12/14/19 2200 12/15/19 0602  AST 152* 158* 137*  ALT 34 34 33  ALKPHOS 66 57 59  BILITOT 6.3* 6.6* 5.5*  PROT 7.4 7.9 7.0  ALBUMIN 2.7* 2.6* 2.5*   No results for input(s): LIPASE, AMYLASE in the last 168 hours. Recent Labs  Lab 12/14/19 1625 12/15/19 0602  AMMONIA 54* 49*   Coagulation Profile: Recent Labs  Lab 12/14/19 1625 12/15/19 0602  INR 2.0* 1.9*   Cardiac Enzymes: No results for input(s): CKTOTAL, CKMB, CKMBINDEX, TROPONINI in the last 168 hours. BNP (last 3 results) No results for input(s): PROBNP in the last 8760 hours. HbA1C: Recent Labs    12/15/19 0602  HGBA1C 6.0*   CBG: Recent Labs  Lab 12/14/19 2352 12/15/19 0320 12/15/19 0854  GLUCAP 88 98 93   Lipid Profile: Recent Labs    12/15/19 0602  CHOL 123  HDL 16*  LDLCALC 84  TRIG 115  CHOLHDL 7.7   Thyroid Function Tests: Recent Labs    12/15/19 0602  TSH 1.557   Anemia Panel: Recent Labs    12/15/19 0602  FERRITIN 277   Sepsis Labs: No results for input(s): PROCALCITON, LATICACIDVEN in the last 168 hours.  Recent Results (from the past 240 hour(s))  SARS Coronavirus 2 by RT PCR (hospital order, performed in [facility] hospital lab) Nasopharyngeal Nasopharyngeal Swab     Status: None   Collection Time: 12/14/19  9:55 PM   Specimen: Nasopharyngeal Swab  Result Value Ref Range Status   SARS Coronavirus 2 NEGATIVE NEGATIVE Final    Comment: (NOTE) SARS-CoV-2 target nucleic acids are NOT DETECTED.  The SARS-CoV-2 RNA is generally detectable in upper and lower respiratory specimens during the acute phase of infection. The lowest concentration of SARS-CoV-2 viral copies this assay can detect is 250 copies / mL.  A negative result does not preclude SARS-CoV-2 infection and should not be used as the sole basis for treatment or other patient management decisions.  A negative result may occur with improper specimen collection / handling, submission of specimen other than nasopharyngeal swab, presence of viral mutation(s) within the areas targeted by this assay, and inadequate number of viral copies (<250 copies / mL). A negative result must be combined with clinical observations, patient history, and epidemiological information.  Fact Sheet for Patients:   StrictlyIdeas.no  Fact Sheet for Healthcare Providers: BankingDealers.co.za  This test is not yet approved or  cleared by the [place] FDA and has been authorized for detection and/or diagnosis of SARS-CoV-2 by FDA under  an Emergency Use Authorization (EUA).  This EUA will remain in effect (meaning this test can be used) for the duration of the COVID-19 declaration under Section 564(b)(1) of the Act, 21 U.S.C. section 360bbb-3(b)(1), unless the authorization is terminated or revoked sooner.  Performed at [facility], [place] [address], [place], [place] [address]          Radiology Studies: CT Head Wo Contrast  Result Date: 12/14/2019 CLINICAL DATA:  Mental status change EXAM: CT HEAD WITHOUT CONTRAST TECHNIQUE: Contiguous axial images were obtained from the base of the skull through the vertex without intravenous contrast. COMPARISON:  CT brain 09/03/2014 FINDINGS: Brain: No acute territorial infarction, hemorrhage or intracranial mass. The ventricles are nonenlarged. Vascular: No hyperdense vessels. Scattered carotid vascular calcification Skull: Normal. Negative for fracture or focal lesion. Sinuses/Orbits: No acute finding. Other: None IMPRESSION: Negative non contrasted CT appearance of the brain. Electronically Signed   By: Donavan Foil M.D.   On: 12/14/2019 23:23   US Abdomen  Limited  Result Date: 12/14/2019 CLINICAL DATA:  Abdomen pain EXAM: ULTRASOUND ABDOMEN LIMITED RIGHT UPPER QUADRANT COMPARISON:  Ultrasound 09/03/2012 FINDINGS: Gallbladder: Small amount of gallbladder sludge. Shadowing stone measuring up to 1.6 cm. Normal wall thickness. Negative sonographic Murphy. Common bile duct: Diameter: 6 mm Liver: Liver is slightly echogenic. Contour nodularity of the liver. Portal vein is patent on color Doppler imaging with normal direction of blood flow towards the liver. Other: None. IMPRESSION: 1. Sludge and gallstone without sonographic evidence for acute cholecystitis 2. Suspected liver cirrhosis Electronically Signed   By: Donavan Foil M.D.   On: 12/14/2019 21:37   DG CHEST PORT 1 VIEW  Result Date: 12/15/2019 CLINICAL DATA:  GI bleeding EXAM: PORTABLE CHEST 1 VIEW COMPARISON:  04/19/2015 FINDINGS: Hardware in the cervical spine. Post sternotomy changes. Streaky atelectasis left base. No consolidation or pleural effusion. Borderline cardiac size. No pneumothorax. Stable calcified lung nodule in the right upper lobe. IMPRESSION: Streaky atelectasis left base. Electronically Signed   By: Donavan Foil M.D.   On: 12/15/2019 00:34        Scheduled Meds:  insulin aspart  0-9 Units Subcutaneous Q4H   lactulose  30 g Oral TID   metoprolol tartrate  50 mg Oral BID   montelukast  10 mg Oral QHS   [START ON 12/18/2019] pantoprazole  40 mg Intravenous Q12H   sodium chloride flush  3 mL Intravenous Q12H   spironolactone  25 mg Oral BID   Continuous Infusions:  cefTRIAXone (ROCEPHIN)  IV Stopped (12/15/19 0052)   octreotide  (SANDOSTATIN)    IV infusion 50 mcg/hr (12/15/19 0414)   pantoprozole (PROTONIX) infusion 8 mg/hr (12/15/19 0700)   phytonadione (VITAMIN K) IV 10 mg (12/15/19 1151)     LOS: 1 day    Time spent: 42 minutes spent on chart review, discussion with nursing staff, consultants, updating family and interview/physical exam; more than 50% of  that time was spent in counseling and/or coordination of care.    Caydence Enck J [place] DO [facility] Available via Epic secure chat 7am-7pm After these hours, please refer to coverage provider listed on [email or web address] 12/15/2019, 12:29 PM

## 2019-12-15 NOTE — H&P (View-Only) (Signed)
[facility] Consultation Note  Referring Provider: [facility] Primary Care Physician:  Gaynelle Arabian, MD  Reason for Consultation:  Blood in stool  HPI: Randall Frazier is a 64 y.o. 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  No abdominal pain, hematemesis, change in bowel habits.  Had colonoscopy over 10 years ago, is scheduled for another one as outpatient later this month.  No prior endoscopy.  No NSAIDs.     Past Medical History:  Diagnosis Date  . Arthritis    shoulder- both, neck, spine, GOUT  . Diabetes mellitus   . Gout   . H/O exercise stress test    many yrs. ago, no need for f/u with cardiac   . Hypertension   . Pneumonia    while in [employer], [id number]   . Weight loss 2011   100 lbs. weight loss -over 18 mon. period     Past Surgical History:  Procedure Laterality Date  . ANTERIOR CERVICAL DECOMP/DISCECTOMY FUSION N/A 12/20/2012   Procedure: C5-6, C6-7 ANTERIOR CERVICAL DISCECTOMY AND FUSION, ALLOGRAFT, PLATE;  Surgeon: Marybelle Killings, MD;  Location: [facility];  Service: Orthopedics;  Laterality: N/A;  . 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 Authorizing Provider  acetaminophen (TYLENOL) 500 MG tablet Take 1,000 mg by mouth every 8 (eight) hours as needed (for pain).    Yes [provider]  allopurinol (ZYLOPRIM) 100 MG tablet Take 100 mg by mouth as needed (AS DIRECTED, FOR GOUT FLARES).    Yes [provider]  aspirin EC 81 MG tablet Take 81 mg by mouth daily.   Yes [provider]  Bacitracin-Polymyxin B (SM DOUBLE ANTIBIOTIC) 500-10000 UNIT/GM OINT Apply 1 application topically See admin instructions. Apply to affected area of right leg 2 times a day 12/02/19  Yes [provider]  Cholecalciferol (VITAMIN D3) 50 MCG (2000 UT)  TABS Take 2,000 Units by mouth daily.   Yes [provider]  diphenhydrAMINE (BENADRYL) 25 mg capsule Take 25 mg by mouth at bedtime.   Yes [provider]  fish oil-omega-3 fatty acids 1000 MG capsule Take 1,000 mg by mouth daily.    Yes [provider]  loratadine (CLARITIN) 10 MG tablet Take 10 mg by mouth daily as needed for allergies or rhinitis.   Yes [provider]  LUBRICATING PLUS EYE DROPS 0.5 % SOLN Place 1 drop into both eyes 3 (three) times daily as needed (for irritation).  10/05/19  Yes [provider]  meloxicam (MOBIC) 15 MG tablet Take 15 mg by mouth at bedtime as needed for pain.    Yes [provider]  metFORMIN (GLUCOPHAGE) 1000 MG tablet Take 1,000 mg by mouth 2 (two) times daily with a meal.   Yes [provider]  metoprolol tartrate (LOPRESSOR) 50 MG tablet Take 50 mg by mouth 2 (two) times daily.   Yes [provider]  montelukast (SINGULAIR) 10 MG tablet Take 10 mg by mouth at bedtime.   Yes [provider]  spironolactone (ALDACTONE) 25 MG tablet Take 25 mg by mouth 2 (two) times daily.   Yes [provider]  testosterone cypionate (DEPOTESTOTERONE CYPIONATE) 200 MG/ML injection Inject into the muscle every 14 (fourteen) days.    Yes [provider]  THERAPEUTIC SHAMPOO 0.5 % shampoo Apply 1 application topically See admin instructions. Shampoo at least 4 times a week 10/05/19  Yes [provider]  vitamin E (VITAMIN E) 180 MG (400 UNITS) capsule Take 400 Units by mouth daily.   Yes [provider]  atorvastatin (LIPITOR) 20 MG tablet Take 20 mg by mouth at bedtime.  Patient not taking: Reported on 12/14/2019    [provider]  cyclobenzaprine (FLEXERIL) 10 MG tablet Take 10 mg by mouth at bedtime. Take one every night Patient not taking: Reported on 12/14/2019    [provider]  metFORMIN (GLUCOPHAGE) 500 MG tablet Take 500 mg by mouth 4  (four) times daily. Take 4 times a day per patient Patient not taking: Reported on 12/14/2019    [provider]  methocarbamol (ROBAXIN) 500 MG tablet Take 1 tablet (500 mg total) by mouth every 6 (six) hours as needed (spasm). Patient not taking: Reported on 12/14/2019 12/20/12   Phillips Hay, PA-C  oxyCODONE-acetaminophen (ROXICET) 5-325 MG per tablet Take 1-2 tablets by mouth every 4 (four) hours as needed for pain. Patient not taking: Reported on 12/14/2019 12/20/12   Phillips Hay, PA-C  traZODone (DESYREL) 150 MG tablet Take 150 mg by mouth at bedtime. Patient not taking: Reported on 12/14/2019    [provider]    Current Facility-Administered Medications  Medication Dose Route Frequency Provider Last Rate Last Admin  . 0.9 %  sodium chloride infusion   Intravenous Continuous Doutova, Anastassia, MD 75 mL/hr at 12/15/19 0700 Restarted at 12/15/19 0700  . cefTRIAXone (ROCEPHIN) 2 g in sodium chloride 0.9 % 100 mL IVPB  2 g Intravenous QHS Toy Baker, MD   Stopped at 12/15/19 0052  . insulin aspart (novoLOG) injection 0-9 Units  0-9 Units Subcutaneous Q4H Doutova, Anastassia, MD      . lactulose (CHRONULAC) 10 GM/15ML solution 30 g  30 g Oral TID Toy Baker, MD   30 g at 12/15/19 0905  . loratadine (CLARITIN) tablet 10 mg  10 mg Oral Daily PRN Doutova, Anastassia, MD      . metoprolol tartrate (LOPRESSOR) tablet 50 mg  50 mg Oral BID Toy Baker, MD   50 mg at 12/15/19 0905  . montelukast (SINGULAIR) tablet 10 mg  10 mg Oral QHS Doutova, Anastassia, MD      . octreotide (SANDOSTATIN) 500 mcg in sodium chloride 0.9 % 250 mL (2 mcg/mL) infusion  50 mcg/hr Intravenous Continuous Doutova, Anastassia, MD 25 mL/hr at 12/15/19 0414 50 mcg/hr at 12/15/19 0414  . ondansetron (ZOFRAN) tablet 4 mg  4 mg Oral Q6H PRN Toy Baker, MD       Or  . ondansetron (ZOFRAN) injection 4 mg  4 mg Intravenous Q6H PRN Doutova, Anastassia, MD      . pantoprazole  (PROTONIX) 80 mg in sodium chloride 0.9 % 100 mL (0.8 mg/mL) infusion  8 mg/hr Intravenous Continuous Doutova, Anastassia, MD 10 mL/hr at 12/15/19 0122 8 mg/hr at 12/15/19 0122  . [START ON 12/18/2019] pantoprazole (PROTONIX) injection 40 mg  40 mg Intravenous Q12H Doutova, Anastassia, MD      . sodium chloride flush (NS) 0.9 % injection 3 mL  3 mL Intravenous Q12H Doutova, Anastassia, MD      . spironolactone (ALDACTONE) tablet 25 mg  25 mg Oral BID Toy Baker, MD        Allergies as of 12/14/2019 - Review Complete 12/14/2019  Allergen Reaction Noted  . Pork-derived products Other (See Comments) 12/14/2019  .  Statins Other (See Comments) 03/06/2015  . Trazodone and nefazodone Nausea Only and Other (See Comments) 12/14/2019  . Lisinopril Rash 07/27/2012  . Niacin Rash 04/24/2015  . Niacin and related Rash 10/26/2011    Family History  Problem Relation Age of Onset  . Liver disease Sister   . Diabetes Neg Hx     Social History   Socioeconomic History  . Marital status: Married    Spouse name: Not on file  . Number of children: Not on file  . Years of education: Not on file  . Highest education level: Not on file  Occupational History  . Not on file  Tobacco Use  . Smoking status: Never Smoker  . Smokeless tobacco: Never Used  Substance and Sexual Activity  . Alcohol use: Yes    Comment: 2 beers / year- very rare  . Drug use: No  . Sexual activity: Not on file  Other Topics Concern  . Not on file  Social History Narrative  . Not on file   Social Determinants of Health   Financial Resource Strain:   . Difficulty of Paying Living Expenses:   Food Insecurity:   . Worried About Charity fundraiser in the Last Year:   . Arboriculturist in the Last Year:   Transportation Needs:   . Film/video editor (Medical):   Marland Kitchen Lack of Transportation (Non-Medical):   Physical Activity:   . Days of Exercise per Week:   . Minutes of Exercise per Session:   Stress:   .  Feeling of Stress :   Social Connections:   . Frequency of Communication with Friends and Family:   . Frequency of Social Gatherings with Friends and Family:   . Attends Religious Services:   . Active Member of Clubs or Organizations:   . Attends Archivist Meetings:   Marland Kitchen Marital Status:   Intimate Partner Violence:   . Fear of Current or Ex-Partner:   . Emotionally Abused:   Marland Kitchen Physically Abused:   . Sexually Abused:     Review of Systems: As per HPI, all others negative   Physical Exam: Vital signs in last 24 hours: Temp:  [98 F (36.7 C)-98.5 F (36.9 C)] 98.4 F (36.9 C) (08/05 0900) Pulse Rate:  [65-80] 78 (08/05 0900) Resp:  [12-23] 18 (08/05 0900) BP: (129-164)/(65-91) 129/67 (08/05 0900) SpO2:  [92 %-99 %] 93 % (08/05 0900) Weight:  [129 kg] 129 kg (08/05 0500) Last BM Date: 12/15/19 General:   Alert,  Well-developed, well-nourished, pleasant and cooperative in NAD Head:  Normocephalic and atraumatic. Eyes:  Sclera icteric,  Conjunctiva pink. Ears:  Normal auditory acuity. Nose:  No deformity, discharge,  or lesions. Mouth:  No deformity or lesions.  Oropharynx pink & moist. Neck:  Supple; no masses or thyromegaly. Lungs:  Clear throughout to auscultation.   No wheezes, crackles, or rhonchi. No acute distress. Heart:  Regular rate and rhythm; no murmurs, clicks, rubs,  or gallops. Abdomen:  Soft, nontender and nondistended. No masses, hepatosplenomegaly or hernias noted. Normal bowel sounds, without guarding, and without rebound.     Msk:  Symmetrical without gross deformities. Normal posture. Pulses:  Normal pulses noted. Extremities:  Without clubbing, + edema. Neurologic:  Alert and  oriented x4;  grossly normal neurologically. Skin:  Intact without significant lesions or rashes. Cervical Nodes:  No significant cervical adenopathy. Psych:  Alert and cooperative. Normal mood and affect.   Lab Results: Recent Labs  12/14/19 1625 12/15/19 0602   WBC 11.6* 9.1  HGB 16.0 14.9  HCT 45.9 42.9  PLT 59* 58*   BMET Recent Labs    12/14/19 1625 12/15/19 0602  NA 129* 131*  K 4.2 3.9  CL 101 98  CO2 21* 21*  GLUCOSE 99 89  BUN 19 19  CREATININE 1.24 1.17  CALCIUM 8.3* 8.0*   LFT Recent Labs    12/14/19 2200 12/14/19 2200 12/15/19 0602  PROT 7.9   < > 7.0  ALBUMIN 2.6*   < > 2.5*  AST 158*   < > 137*  ALT 34   < > 33  ALKPHOS 57   < > 59  BILITOT 6.6*   < > 5.5*  BILIDIR 1.4*  --   --   IBILI 5.2*  --   --    < > = values in this interval not displayed.   PT/INR Recent Labs    12/14/19 1625 12/15/19 0602  LABPROT 21.5* 21.0*  INR 2.0* 1.9*    Studies/Results: CT Head Wo Contrast  Result Date: 12/14/2019 CLINICAL DATA:  Mental status change EXAM: CT HEAD WITHOUT CONTRAST TECHNIQUE: Contiguous axial images were obtained from the base of the skull through the vertex without intravenous contrast. COMPARISON:  CT brain 09/03/2014 FINDINGS: Brain: No acute territorial infarction, hemorrhage or intracranial mass. The ventricles are nonenlarged. Vascular: No hyperdense vessels. Scattered carotid vascular calcification Skull: Normal. Negative for fracture or focal lesion. Sinuses/Orbits: No acute finding. Other: None IMPRESSION: Negative non contrasted CT appearance of the brain. Electronically Signed   By: Donavan Foil M.D.   On: 12/14/2019 23:23   US Abdomen Limited  Result Date: 12/14/2019 CLINICAL DATA:  Abdomen pain EXAM: ULTRASOUND ABDOMEN LIMITED RIGHT UPPER QUADRANT COMPARISON:  Ultrasound 09/03/2012 FINDINGS: Gallbladder: Small amount of gallbladder sludge. Shadowing stone measuring up to 1.6 cm. Normal wall thickness. Negative sonographic Murphy. Common bile duct: Diameter: 6 mm Liver: Liver is slightly echogenic. Contour nodularity of the liver. Portal vein is patent on color Doppler imaging with normal direction of blood flow towards the liver. Other: None. IMPRESSION: 1. Sludge and gallstone without sonographic  evidence for acute cholecystitis 2. Suspected liver cirrhosis Electronically Signed   By: Donavan Foil M.D.   On: 12/14/2019 21:37   DG CHEST PORT 1 VIEW  Result Date: 12/15/2019 CLINICAL DATA:  GI bleeding EXAM: PORTABLE CHEST 1 VIEW COMPARISON:  04/19/2015 FINDINGS: Hardware in the cervical spine. Post sternotomy changes. Streaky atelectasis left base. No consolidation or pleural effusion. Borderline cardiac size. No pneumothorax. Stable calcified lung nodule in the right upper lobe. IMPRESSION: Streaky atelectasis left base. Electronically Signed   By: Donavan Foil M.D.   On: 12/15/2019 00:34   Impression:  1.  Black stools.  GI bleed?  Normal Hgb. 2.  Elevated LFTs, consistent with cirrhosis; no biliary obstruction on ultrasound.  Does report to some alcohol use ("not a lot"). 3.  Coagulopathy and thrombocytopenia; consistent with liver injury + cirrhosis.  Plan:  1.  Needs IV vitamin K and bag of platelets today. 2.  Continue PPI. 3.  I don't see compelling evidence for variceal bleeding, or compelling indication for octreotide. 4.  Endoscopy planned for tomorrow; pending these findings, and clinical course, patient probably ok to proceed with colonoscopy electively as outpatient as had already been planned. 5.  [facility] will follow.   LOS: 1 day   Malachai Schalk M  12/15/2019, 9:37 AM  Cell [phone] If no answer or after  5 PM call [phone]

## 2019-12-15 NOTE — Evaluation (Signed)
Physical Therapy Evaluation & Discharge Patient Details Name: Randall Frazier MRN: [id number] DOB: [birth year] Today's Date: 12/15/2019   History of Present Illness  Pt is a 64 y.o. male admitted 12/14/19 with confusion and tarry stools. Workup for upper GIB in setting of liver failure due to cirrhosis of unknown etiology; RLE cellulitis. Other PMH includes CAD, DM2, brain mass benign.    Clinical Impression  Patient evaluated by Physical Therapy with no further acute PT needs identified. PTA, pt independent, lives with wife and enjoys riding motorcycles with Science writer. Today, pt independent with mobility, including hallway ambulation. All education has been completed and the patient has no further questions. Acute PT is signing off. Thank you for this referral.    Follow Up Recommendations No PT follow up    Equipment Recommendations  None recommended by PT    Recommendations for Other Services       Precautions / Restrictions Restrictions Weight Bearing Restrictions: No      Mobility  Bed Mobility Overal bed mobility: Independent Bed Mobility: Supine to Sit;Sit to Supine              Transfers Overall transfer level: Independent Equipment used: None                Ambulation/Gait Ambulation/Gait assistance: Independent Gait Distance (Feet): 700 Feet Assistive device: IV Pole;None Gait Pattern/deviations: Step-through pattern;Decreased stride length   Gait velocity interpretation: 1.31 - 2.62 ft/sec, indicative of limited community ambulator General Gait Details: Steady gait, independent with and without pushing IV pole  Stairs Stairs: Yes Stairs assistance: Modified independent (Device/Increase time) Stair Management: One rail Left;No rails;Alternating pattern;Forwards Number of Stairs: 4 General stair comments: Ascend/descended 4 steps mod indep  Wheelchair Mobility    Modified Rankin (Stroke Patients Only)       Balance Overall balance  assessment: No apparent balance deficits (not formally assessed)                                           Pertinent Vitals/Pain Pain Assessment: No/denies pain    Home Living Family/patient expects to be discharged to:: Private residence Living Arrangements: Spouse/significant other Available Help at Discharge: Family;Available PRN/intermittently Type of Home: House Home Access: Stairs to enter Entrance Stairs-Rails: Left Entrance Stairs-Number of Steps: 3 Home Layout: Able to live on main level with bedroom/bathroom;Laundry or work area in [place]: Environmental consultant - 2 wheels;Cane - single point;Bedside commode Additional Comments: Has DME from previous heart surgery though has not used in 4 years    Prior Function Level of Independence: Independent         Comments: 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 to raise money for various organizations/causes     Hand Dominance   Dominant Hand: Right    Extremity/Trunk Assessment   Upper Extremity Assessment Upper Extremity Assessment: Overall WFL for tasks assessed    Lower Extremity Assessment Lower Extremity Assessment: Overall WFL for tasks assessed    Cervical / Trunk Assessment Cervical / Trunk Assessment: Normal  Communication   Communication: No difficulties  Cognition Arousal/Alertness: Awake/alert Behavior During Therapy: WFL for tasks assessed/performed Overall Cognitive Status: Within Functional Limits for tasks assessed  General Comments General comments (skin integrity, edema, etc.): Encouraged at least 3x/day hallway ambulation    Exercises     Assessment/Plan    PT Assessment Patent does not need any further PT services  PT Problem List         PT Treatment Interventions      PT Goals (Current goals can be found in the Care Plan section)  Acute Rehab PT Goals PT Goal Formulation: All  assessment and education complete, DC therapy    Frequency     Barriers to discharge        Co-evaluation               AM-PAC PT "6 Clicks" Mobility  Outcome Measure Help needed turning from your back to your side while in a flat bed without using bedrails?: None Help needed moving from lying on your back to sitting on the side of a flat bed without using bedrails?: None Help needed moving to and from a bed to a chair (including a wheelchair)?: None Help needed standing up from a chair using your arms (e.g., wheelchair or bedside chair)?: None Help needed to walk in hospital room?: None Help needed climbing 3-5 steps with a railing? : None 6 Click Score: 24    End of Session Equipment Utilized During Treatment: Gait belt Activity Tolerance: Patient tolerated treatment well Patient left: in bed;with call bell/phone within reach Nurse Communication: Mobility status PT Visit Diagnosis: Other abnormalities of gait and mobility (R26.89)    Time: 7218-2883 PT Time Calculation (min) (ACUTE ONLY): 20 min   Charges:   PT Evaluation $PT Eval Low Complexity: [place], PT, DPT Acute Rehabilitation Services  Pager [phone] Office [phone]  Derry Lory 12/15/2019, 1:20 PM

## 2019-12-15 NOTE — Progress Notes (Signed)
Occupational Therapy Evaluation Patient Details Name: Randall Frazier MRN: [id number] DOB: [birth year] Today's Date: 12/15/2019    History of Present Illness Pt is a 64 y.o. male admitted 12/14/19 with confusion and tarry stools. Workup for upper GIB in setting of liver failure due to cirrhosis of unknown etiology; RLE cellulitis. Other PMH includes CAD, DM2, brain mass benign.   Clinical Impression   Patient lives with wife (who was an Therapist, sports) and is independent at prior level.  Presents with decreased balance, strength, and activity tolerance.  Patient able to complete bed mobility with supervision and stand with min guard.  Able to walk without AD and was initially slightly unsteady with walk, though progressed to supervision level.  Cognition also progressed as patient initially responding slowly and with increased process time, but improved during session.  Patient was fatigued after toileting and ~5 min of standing activity.  Would benefit from continued OT to address the deficits listed below and improve safety and independence to return home.    Follow Up Recommendations  No OT follow up;Supervision - Intermittent    Equipment Recommendations  None recommended by OT    Recommendations for Other Services       Precautions / Restrictions Precautions Precautions: Fall Restrictions Weight Bearing Restrictions: No      Mobility Bed Mobility Overal bed mobility: Needs Assistance Bed Mobility: Supine to Sit     Supine to sit: Supervision     General bed mobility comments: Had some slight unsteadiness leaning posteriorly when sitting up though able to self correct  Transfers Overall transfer level: Needs assistance Equipment used: None Transfers: Sit to/from Stand Sit to Stand: Min guard              Balance Overall balance assessment: Needs assistance Sitting-balance support: No upper extremity supported;Feet supported Sitting balance-Leahy Scale: Fair     Standing  balance support: No upper extremity supported;During functional activity Standing balance-Leahy Scale: Fair Standing balance comment: No UE support, able to stand at sink without using counter for support                           ADL either performed or assessed with clinical judgement   ADL Overall ADL's : Needs assistance/impaired Eating/Feeding: Independent;Sitting   Grooming: Oral care;Supervision/safety;Standing   Upper Body Bathing: Supervision/ safety;Standing   Lower Body Bathing: Min guard;Sit to/from stand   Upper Body Dressing : Supervision/safety;Sitting   Lower Body Dressing: Min guard;Sit to/from stand   Toilet Transfer: Min guard;Ambulation;Regular Toilet   Toileting- Clothing Manipulation and Hygiene: Supervision/safety;Sitting/lateral lean       Functional mobility during ADLs: Min guard       Vision         Perception     Praxis      Pertinent Vitals/Pain Pain Assessment: No/denies pain     Hand Dominance Right   Extremity/Trunk Assessment Upper Extremity Assessment Upper Extremity Assessment: Generalized weakness   Lower Extremity Assessment Lower Extremity Assessment: Defer to PT evaluation       Communication Communication Communication: No difficulties   Cognition Arousal/Alertness: Awake/alert Behavior During Therapy: WFL for tasks assessed/performed Overall Cognitive Status: Within Functional Limits for tasks assessed                                 General Comments: Respondes slightly delayed at start of eval though improved, may have just  been due to tiredness   General Comments       Exercises     Shoulder Instructions      Home Living Family/patient expects to be discharged to:: Private residence Living Arrangements: Spouse/significant other Available Help at Discharge: Family;Available PRN/intermittently;Other (Comment) (wife is a store owner,used to be Therapist, sports) Type of Home: House Home  Access: Stairs to enter [employer] of Steps: 3   Home Layout: Multi-level;Able to live on main level with bedroom/bathroom (basement floor 15 steps) Alternate Level Stairs-Number of Steps: 15   Bathroom Shower/Tub: Tub/shower unit;Walk-in shower         Home Equipment: Gilford Rile - 2 wheels;Cane - single point;Bedside commode   Additional Comments: Has equipent from previous heart surgery though has not used in 4 years      Prior Functioning/Environment Level of Independence: Independent                 OT Problem List: Decreased strength;Decreased activity tolerance;Impaired balance (sitting and/or standing)      OT Treatment/Interventions: Self-care/ADL training;Therapeutic exercise;Energy conservation;Balance training;Patient/family education    OT Goals(Current goals can be found in the care plan section) Acute Rehab OT Goals Patient Stated Goal: to go home  OT Goal Formulation: With patient Time For Goal Achievement: 12/29/19 Potential to Achieve Goals: Good  OT Frequency: Min 2X/week   Barriers to D/C:            Co-evaluation              AM-PAC OT "6 Clicks" Daily Activity     Outcome Measure Help from another person eating meals?: None Help from another person taking care of personal grooming?: A Little Help from another person toileting, which includes using toliet, bedpan, or urinal?: A Little Help from another person bathing (including washing, rinsing, drying)?: A Little Help from another person to put on and taking off regular upper body clothing?: A Little Help from another person to put on and taking off regular lower body clothing?: A Little 6 Click Score: 19   End of Session Nurse Communication: Mobility status  Activity Tolerance: Patient tolerated treatment well Patient left: in chair;with call bell/phone within reach  OT Visit Diagnosis: Unsteadiness on feet (R26.81);Muscle weakness (generalized) (M62.81);History of falling  (Z91.81)                Time: 5520-8022 OT Time Calculation (min): 24 min Charges:  OT General Charges $OT Visit: 1 Visit OT Evaluation $OT Eval Moderate Complexity: 1 Mod OT Treatments $Self Care/Home Management : 8-22 mins  August Luz, OTR/L   Phylliss Bob 12/15/2019, 9:43 AM

## 2019-12-16 ENCOUNTER — Encounter (HOSPITAL_COMMUNITY)
Admission: EM | Disposition: A | Discharge status: Home / Self Care | Payer: Self-pay | Source: Home / Self Care | Attending: Internal Medicine

## 2019-12-16 ENCOUNTER — Encounter (HOSPITAL_COMMUNITY): Payer: Self-pay | Admitting: Internal Medicine

## 2019-12-16 ENCOUNTER — Inpatient Hospital Stay (HOSPITAL_COMMUNITY): Payer: Medicare Other | Admitting: Certified Registered Nurse Anesthetist

## 2019-12-16 DIAGNOSIS — K2970 Gastritis, unspecified, without bleeding: Secondary | ICD-10-CM | POA: Diagnosis present

## 2019-12-16 DIAGNOSIS — K766 Portal hypertension: Secondary | ICD-10-CM | POA: Diagnosis present

## 2019-12-16 DIAGNOSIS — K259 Gastric ulcer, unspecified as acute or chronic, without hemorrhage or perforation: Secondary | ICD-10-CM | POA: Diagnosis present

## 2019-12-16 DIAGNOSIS — L03115 Cellulitis of right lower limb: Secondary | ICD-10-CM

## 2019-12-16 DIAGNOSIS — K269 Duodenal ulcer, unspecified as acute or chronic, without hemorrhage or perforation: Secondary | ICD-10-CM | POA: Diagnosis present

## 2019-12-16 DIAGNOSIS — I8500 Esophageal varices without bleeding: Secondary | ICD-10-CM | POA: Diagnosis present

## 2019-12-16 HISTORY — PX: ESOPHAGOGASTRODUODENOSCOPY (EGD) WITH PROPOFOL: SHX5813

## 2019-12-16 LAB — CBC WITH DIFFERENTIAL/PLATELET
Abs Immature Granulocytes: 0.02 10*3/uL (ref 0.00–0.07)
Basophils Absolute: 0 10*3/uL (ref 0.0–0.1)
Basophils Relative: 1 %
Eosinophils Absolute: 0.2 10*3/uL (ref 0.0–0.5)
Eosinophils Relative: 4 %
HCT: 38.4 % — ABNORMAL LOW (ref 39.0–52.0)
Hemoglobin: 13.2 g/dL (ref 13.0–17.0)
Immature Granulocytes: 1 %
Lymphocytes Relative: 19 %
Lymphs Abs: 0.8 10*3/uL (ref 0.7–4.0)
MCH: 33.3 pg (ref 26.0–34.0)
MCHC: 34.4 g/dL (ref 30.0–36.0)
MCV: 97 fL (ref 80.0–100.0)
Monocytes Absolute: 0.7 10*3/uL (ref 0.1–1.0)
Monocytes Relative: 16 %
Neutro Abs: 2.6 10*3/uL (ref 1.7–7.7)
Neutrophils Relative %: 59 %
Platelets: 52 10*3/uL — ABNORMAL LOW (ref 150–400)
RBC: 3.96 MIL/uL — ABNORMAL LOW (ref 4.22–5.81)
RDW: 16.7 % — ABNORMAL HIGH (ref 11.5–15.5)
WBC: 4.3 10*3/uL (ref 4.0–10.5)
nRBC: 0 % (ref 0.0–0.2)

## 2019-12-16 LAB — MAGNESIUM: Magnesium: 1.5 mg/dL — ABNORMAL LOW (ref 1.7–2.4)

## 2019-12-16 LAB — CBC
HCT: 38.5 % — ABNORMAL LOW (ref 39.0–52.0)
Hemoglobin: 13.5 g/dL (ref 13.0–17.0)
MCH: 34.4 pg — ABNORMAL HIGH (ref 26.0–34.0)
MCHC: 35.1 g/dL (ref 30.0–36.0)
MCV: 98 fL (ref 80.0–100.0)
Platelets: 51 10*3/uL — ABNORMAL LOW (ref 150–400)
RBC: 3.93 MIL/uL — ABNORMAL LOW (ref 4.22–5.81)
RDW: 16.7 % — ABNORMAL HIGH (ref 11.5–15.5)
WBC: 4 10*3/uL (ref 4.0–10.5)
nRBC: 0 % (ref 0.0–0.2)

## 2019-12-16 LAB — PREPARE PLATELET PHERESIS
Unit division: 0
Unit division: 0

## 2019-12-16 LAB — BPAM PLATELET PHERESIS
Blood Product Expiration Date: 202108052359
Blood Product Expiration Date: 202108062359
ISSUE DATE / TIME: 202108050155
ISSUE DATE / TIME: 202108051436
Unit Type and Rh: 6200
Unit Type and Rh: 6200

## 2019-12-16 LAB — COMPREHENSIVE METABOLIC PANEL
ALT: 28 U/L (ref 0–44)
AST: 107 U/L — ABNORMAL HIGH (ref 15–41)
Albumin: 2.2 g/dL — ABNORMAL LOW (ref 3.5–5.0)
Alkaline Phosphatase: 44 U/L (ref 38–126)
Anion gap: 7 (ref 5–15)
BUN: 21 mg/dL (ref 8–23)
CO2: 25 mmol/L (ref 22–32)
Calcium: 7.5 mg/dL — ABNORMAL LOW (ref 8.9–10.3)
Chloride: 104 mmol/L (ref 98–111)
Creatinine, Ser: 1.25 mg/dL — ABNORMAL HIGH (ref 0.61–1.24)
GFR calc Af Amer: >60 mL/min (ref >60)
GFR calc non Af Amer: >60 mL/min (ref >60)
Glucose, Bld: 91 mg/dL (ref 70–99)
Potassium: 4.1 mmol/L (ref 3.5–5.1)
Sodium: 136 mmol/L (ref 135–145)
Total Bilirubin: 3.8 mg/dL — ABNORMAL HIGH (ref 0.3–1.2)
Total Protein: 5.8 g/dL — ABNORMAL LOW (ref 6.5–8.1)

## 2019-12-16 LAB — PROTIME-INR
INR: 1.7 — ABNORMAL HIGH (ref 0.8–1.2)
INR: 1.7 — ABNORMAL HIGH (ref 0.8–1.2)
Prothrombin Time: 19.6 seconds — ABNORMAL HIGH (ref 11.4–15.2)
Prothrombin Time: 19.2 seconds — ABNORMAL HIGH (ref 11.4–15.2)

## 2019-12-16 LAB — GLUCOSE, CAPILLARY
Glucose-Capillary: 100 mg/dL — ABNORMAL HIGH (ref 70–99)
Glucose-Capillary: 114 mg/dL — ABNORMAL HIGH (ref 70–99)
Glucose-Capillary: 122 mg/dL — ABNORMAL HIGH (ref 70–99)
Glucose-Capillary: 146 mg/dL — ABNORMAL HIGH (ref 70–99)
Glucose-Capillary: 184 mg/dL — ABNORMAL HIGH (ref 70–99)
Glucose-Capillary: 60 mg/dL — ABNORMAL LOW (ref 70–99)
Glucose-Capillary: 77 mg/dL (ref 70–99)
Glucose-Capillary: 88 mg/dL (ref 70–99)

## 2019-12-16 LAB — ANA W/REFLEX IF POSITIVE: Anti Nuclear Antibody (ANA): NEGATIVE

## 2019-12-16 LAB — CERULOPLASMIN: Ceruloplasmin: 20.2 mg/dL (ref 16.0–31.0)

## 2019-12-16 SURGERY — ESOPHAGOGASTRODUODENOSCOPY (EGD) WITH PROPOFOL
Anesthesia: Monitor Anesthesia Care | Laterality: Left

## 2019-12-16 MED ORDER — PROPOFOL 500 MG/50ML IV EMUL
INTRAVENOUS | Status: DC | PRN
  Administered 2019-12-16: 75 ug/kg/min via INTRAVENOUS

## 2019-12-16 MED ORDER — PROPOFOL 10 MG/ML IV BOLUS
INTRAVENOUS | Status: DC | PRN
  Administered 2019-12-16: 20 mg via INTRAVENOUS

## 2019-12-16 MED ORDER — SODIUM CHLORIDE 0.9 % IV SOLN
INTRAVENOUS | Status: DC | PRN
Start: 2019-12-16 — End: 2019-12-16

## 2019-12-16 MED ORDER — DEXTROSE 50 % IV SOLN
INTRAVENOUS | Status: AC
  Filled 2019-12-16: qty 50

## 2019-12-16 SURGICAL SUPPLY — 15 items

## 2019-12-16 NOTE — Interval H&P Note (Signed)
History and Physical Interval Note:  12/16/2019 1:35 PM  Randall Frazier  has presented today for surgery, with the diagnosis of cirrhosis, black sotols.  The various methods of treatment have been discussed with the patient and family. After consideration of risks, benefits and other options for treatment, the patient has consented to  Procedure(s): ESOPHAGOGASTRODUODENOSCOPY (EGD) WITH PROPOFOL (Left) as a surgical intervention.  The patient's history has been reviewed, patient examined, no change in status, stable for surgery.  I have reviewed the patient's chart and labs.  Questions were answered to the patient's satisfaction.     Lear Ng

## 2019-12-16 NOTE — Op Note (Addendum)
[facility] Patient Name: Randall Frazier Procedure Date : 12/16/2019 MRN: [id number] Attending MD: Lear Ng , MD Date of Birth: June 29, 1955 CSN: [id number] Age: 64 Admit Type: Inpatient Procedure:                Upper GI endoscopy Indications:              Melena, Cirrhosis Providers:                Lear Ng, MD, Angus Seller, Tyrone Apple, Technician, Dellie Catholic, CRNA Referring MD:             hospital team Medicines:                Propofol per Anesthesia, Monitored Anesthesia Care Complications:            No immediate complications. Estimated Blood Loss:     Estimated blood loss: none. Procedure:                Pre-Anesthesia Assessment:                           - Prior to the procedure, a History and Physical                            was performed, and patient medications and                            allergies were reviewed. The patient's tolerance of                            previous anesthesia was also reviewed. The risks                            and benefits of the procedure and the sedation                            options and risks were discussed with the patient.                            All questions were answered, and informed consent                            was obtained. Prior Anticoagulants: The patient has                            taken no previous anticoagulant or antiplatelet                            agents. ASA Grade Assessment: III - A patient with                            severe systemic disease. After reviewing the risks  and benefits, the patient was deemed in                            satisfactory condition to undergo the procedure.                           After obtaining informed consent, the endoscope was                            passed under direct vision. Throughout the                            procedure, the patient's blood pressure,  pulse, and                            oxygen saturations were monitored continuously. The                            GIF-H190 ([id number] Olympus gastroscope was                            introduced through the mouth, and advanced to the                            second part of duodenum. The upper GI endoscopy was                            accomplished without difficulty. The patient                            tolerated the procedure well. Scope In: Scope Out: Findings:      Grade I varices were found in the distal esophagus.      The Z-line was regular and was found 42 cm from the incisors.      Moderate portal hypertensive gastropathy was found in the stomach.      Diffuse moderate inflammation characterized by congestion (edema),       erosions and erythema was found in the gastric body and in the gastric       antrum.      One non-bleeding cratered gastric ulcer with no stigmata of bleeding was       found in the prepyloric region of the stomach. The lesion was 3 mm in       largest dimension.      Several non-bleeding superficial duodenal ulcers with pigmented material       were found in the duodenal bulb. The largest lesion was 15 mm in largest       dimension.      Segmental severe mucosal changes characterized by congestion, erythema,       erosion and ulceration were found in the duodenal bulb. Impression:               - Grade I esophageal varices.                           - Z-line regular, 42 cm from the incisors.                           -  Portal hypertensive gastropathy.                           - Acute gastritis.                           - Non-bleeding gastric ulcer with no stigmata of                            bleeding.                           - Several non-bleeding duodenal ulcers with                            pigmented material.                           - Mucosal changes in the duodenum.                           - No specimens collected. Recommendation:            - Clear liquid diet.                           - Observe patient's clinical course.                           - Give Protonix (pantoprazole): 8 mg/hr IV by                            continuous infusion.                           - Perform an H. pylori serology. Procedure Code(s):        --- Professional ---                           [phone], Esophagogastroduodenoscopy, flexible,                            transoral; diagnostic, including collection of                            specimen(s) by brushing or washing, when performed                            (separate procedure) Diagnosis Code(s):        --- Professional ---                           K92.1, Melena (includes Hematochezia)                           K26.9, Duodenal ulcer, unspecified as acute or                            chronic, without hemorrhage  or perforation                           K25.9, Gastric ulcer, unspecified as acute or                            chronic, without hemorrhage or perforation                           I85.00, Esophageal varices without bleeding                           K76.6, Portal hypertension                           K29.00, Acute gastritis without bleeding                           K31.89, Other diseases of stomach and duodenum CPT copyright 2019 [employer]. All rights reserved. The codes documented in this report are preliminary and upon coder review may  be revised to meet current compliance requirements. Lear Ng, MD 12/16/2019 2:00:14 PM This report has been signed electronically. Number of Addenda: 0

## 2019-12-16 NOTE — Anesthesia Postprocedure Evaluation (Signed)
Anesthesia Post Note  Patient: Francesca Jewett  Procedure(s) Performed: ESOPHAGOGASTRODUODENOSCOPY (EGD) WITH PROPOFOL (Left )     Patient location during evaluation: Endoscopy Anesthesia Type: MAC Level of consciousness: awake and alert Pain management: pain level controlled Vital Signs Assessment: post-procedure vital signs reviewed and stable Respiratory status: spontaneous breathing, nonlabored ventilation and respiratory function stable Cardiovascular status: blood pressure returned to baseline and stable Postop Assessment: no apparent nausea or vomiting Anesthetic complications: no   No complications documented.  Last Vitals:  Vitals:   12/16/19 1404 12/16/19 1415  BP: (!) 106/56 (!) 114/54  Pulse: (!) 51 (!) 50  Resp: 14 19  Temp:    SpO2: 95% 95%    Last Pain:  Vitals:   12/16/19 1415  TempSrc:   PainSc: 0-No pain                 Lidia Collum

## 2019-12-16 NOTE — Anesthesia Preprocedure Evaluation (Signed)
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  negative psych ROS   GI/Hepatic negative GI ROS, (+) Cirrhosis       ,   Endo/Other  diabetesMorbid obesity  Renal/GU ARFRenal disease  negative genitourinary   Musculoskeletal  (+) Arthritis ,   Abdominal   Peds  Hematology negative hematology ROS (+)   Anesthesia Other Findings  Thrombocytopenia (plts 51) INR 1.7  Reproductive/Obstetrics                            Anesthesia Physical Anesthesia Plan  ASA: IV  Anesthesia Plan: MAC   Post-op Pain Management:    Induction: Intravenous  PONV Risk Score and Plan: 1 and Propofol infusion, TIVA and Treatment may vary due to age or medical condition  Airway Management Planned: Natural Airway, Nasal Cannula and Simple Face Mask  Additional Equipment: None  Intra-op Plan:   Post-operative Plan:   Informed Consent: I have reviewed the patients History and Physical, chart, labs and discussed the procedure including the risks, benefits and alternatives for the proposed anesthesia with the patient or authorized representative who has indicated his/her understanding and acceptance.       Plan Discussed with:   Anesthesia Plan Comments:        Anesthesia Quick Evaluation

## 2019-12-16 NOTE — Transfer of Care (Signed)
Immediate Anesthesia Transfer of Care Note  Patient: Randall Frazier  Procedure(s) Performed: ESOPHAGOGASTRODUODENOSCOPY (EGD) WITH PROPOFOL (Left )  Patient Location: Endoscopy Unit  Anesthesia Type:MAC  Level of Consciousness: awake, alert  and oriented  Airway & Oxygen Therapy: Patient Spontanous Breathing and Patient connected to nasal cannula oxygen  Post-op Assessment: Report given to RN and Post -op Vital signs reviewed and stable  Post vital signs: Reviewed and stable  Last Vitals:  Vitals Value Taken Time  BP    Temp    Pulse    Resp    SpO2      Last Pain:  Vitals:   12/16/19 1239  TempSrc: Axillary  PainSc: 0-No pain         Complications: No complications documented.

## 2019-12-16 NOTE — Anesthesia Procedure Notes (Signed)
Procedure Name: MAC Date/Time: 12/16/2019 1:40 PM Performed by: Candis Shine, CRNA Pre-anesthesia Checklist: Patient identified, Emergency Drugs available, Suction available and Patient being monitored Patient Re-evaluated:Patient Re-evaluated prior to induction Oxygen Delivery Method: Nasal cannula Dental Injury: Teeth and Oropharynx as per pre-operative assessment

## 2019-12-16 NOTE — Progress Notes (Signed)
Pt's blood sugar 60 @ 0400. Pt NPO. Asymptomatic at this time. 1/2 Amp of Dextrose given. Will monitor and retake blood sugar.

## 2019-12-16 NOTE — Brief Op Note (Signed)
Multiple duodenal bulb (largest 1.5 cm in size) ulcers with bleeding stigmata without active bleeding. Moderate portal gastropathy. Small esophageal varices. See endopro for details. Check H. Pylori serology. Continue Octreotide and Protonix drip. Clear liquid diet. Dr. Penelope Coop will f/u from [facility] tomorrow.

## 2019-12-16 NOTE — Progress Notes (Signed)
Follow up blood sugar 100.

## 2019-12-16 NOTE — Plan of Care (Signed)
?  Problem: Education: ?Goal: Knowledge of General Education information will improve ?Description: Including pain rating scale, medication(s)/side effects and non-pharmacologic comfort measures ?Outcome: Progressing ?  ?Problem: Health Behavior/Discharge Planning: ?Goal: Ability to manage health-related needs will improve ?Outcome: Progressing ?  ?Problem: Coping: ?Goal: Level of anxiety will decrease ?Outcome: Progressing ?  ?

## 2019-12-16 NOTE — Progress Notes (Signed)
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  Patient also noted skin discoloration to yellow with yellow sclera and increased edema to lower extremities.  Patient recently establish care with gastroenterology at the [facility] and was recently started on the spironolactone.  He reports previous liver biopsy in the [place], roughly 1994 and does not report any significant findings.  In the ED, sodium 129, potassium 4.2, BUN 19, creatinine 1.24, AST 152, ALT 34, ammonia level 54.  Total bilirubin 6.3.  WBC 11.6, hemoglobin 16.0, platelets 59.  INR 2.0.  HEENT negative.  Chest x-ray with streaky atelectasis left base.  CT head without acute intracranial abnormality.  Case was discussed by ED physician with GI, Dr. Watt Climes about concern for upper GI bleed versus decompensated cirrhosis; recommended hospitalist admission with a GI to follow in the a.m.  Assessment & Plan:   Active Problems:   Melena   DM2 (diabetes mellitus, type 2) (HCC)   Decompensated hepatic cirrhosis (HCC)   CAD (coronary artery disease)   Hyperlipemia   Essential hypertension   Dyspnea   Thrombocytopenia (HCC)   AKI (acute kidney injury) (HCC)   Hypoalbuminemia   Acute hepatic encephalopathy   Hyponatremia   Cellulitis of right leg   Upper GI bleed 2/2 esophageal varices, gastritis, gastric/duodenal ulcers Patient presenting with 3-day history of reported dark tarry stools.  Hemoglobin 14.9 on admission with normal BUN.  Denies NSAID abuse, reports regular Tylenol use and occasional EtOH use.  Seems to have been recently diagnosed with cirrhosis.  FOBT negative.   Considerations are for gastric ulcer/gastritis versus esophageal varices in the setting of cirrhosis. EGD on 12/16/2019 notable for grade 1 varices distal esophagus, moderate portal hypertensive gastropathy, bleeding gastric ulcer, several nonbleeding duodenal ulcer. --[facility] following, appreciate assistance --Continue to holding home aspirin --Continue Protonix drip  --Continue octreotide drip --Continue to monitor hemoglobin daily  Decompensated cirrhosis Hx NAFLD Recently established care with gastroenterology at the [facility] and started on spironolactone 25 mg p.o. twice daily.  Right upper quadrant ultrasound notes liver slightly echogenic with contour nodularity and portal vein patent on color Doppler imaging.  Acute hepatitis panel negative.  Received IV albumin on admission. --GI following as above --MELD-NA on admission = 27; down to 21 today --MDF = 32.2; down to 21.7 today --Continue spironolactone 25 mg p.o. twice daily --Strict I's and O's and daily weights  Acute renal failure Patient presenting with elevated creatinine of 1.24 with this baseline 0.90 April 2016.  Etiology likely secondary to hepatorenal syndrome versus dehydration in the setting of recent spironolactone initiation. --Cr 1.24>1.17>1.25 --Continue to monitor renal function closely daily  Acute hepatic encephalopathy Patient presenting with 3-day progressive confusion.  CT head with no acute intracranial abnormality.  Ammonia level elevated at 54 on admission.  EtOH level less than 5. --Mentation appears to have returned back to his normal baseline --Continue lactulose 30g PO TID, titrate for 2-3 soft BMs daily  Right lower extremity cellulitis Patient with right lower extremity erythema.  WBC count 9.1. --Continue antibiotics with ceftriaxone --Follow CBC daily  Thrombocytopenia Platelets 58 on admission, likely function of poor synthetic function due to  underlying cirrhosis.  Received vitamin K 5 mg p.o. and  vitamin K 10 mg IV and 2 unit platelets on admission. --Holding home aspirin --Follow CBC daily  Hyponatremia Sodium 129 on admission with urine osmolality 717, urine sodium 32, urine creatinine 183. --Na [id number] --Monitor electrolytes daily  Essential hypertension --Continue metoprolol tartrate 50 mg p.o. twice daily  T2DM Hemoglobin A1c 6.0. On Metformin 1000 mg p.o. twice daily at home. --Hold oral hypoglycemics while inpatient --Insulin sliding scale for further coverage --CBG's before every meal/at bedtime  CAD s/p CABG Previously on atorvastatin which has been discontinued due to cirrhosis.  TTE with LVEF 55-60%, moderate LVH, grade 2 diastolic dysfunction, LA mildly dilated, mild MR. --hold home aspirin in the setting of possible upper GI bleed  Hx Gout: On allopurinol outpatient.  Left sphenoid wing meningioma Follows with [facility] neurosurgery.  Status post gamma knife surgery on 08/03/2018.  Seen in 63-monthfollow-up last on 02/16/2019.  Follow-up MRI notable for no growth and no concerning changes and recommends repeat follow-up 1 year with repeat MRI.    DVT prophylaxis: SCD's Code Status: FULL Code Family Communication: None present at bedside this morning; updated patient spouse CArbie Cookeyvia telephone this afternoon  Disposition Plan:  Status is: Inpatient  Remains inpatient appropriate because:Persistent severe electrolyte disturbances, Altered mental status, Ongoing diagnostic testing needed not appropriate for outpatient work up, Unsafe d/c plan and IV treatments appropriate due to intensity of illness or inability to take PO   Dispo: The patient is from: Home              Anticipated d/c is to: Home              Anticipated d/c date is: 3 days              Patient currently is not medically stable to d/c.    Consultants:   [facility] - Dr. OPaulita Fujita Procedures:   none  Antimicrobials:   none    Subjective: Patient seen and examined  bedside, resting comfortably.  Awaiting EGD this afternoon.  No specific complaints this morning.  Continues to report dark stools.  Hemoglobin stable.  Further denies headache, no visual changes, no chest pain, no palpitations, no shortness of breath, no abdominal pain, no weakness, no fatigue, no paresthesias.  No acute events overnight per nursing staff.  Objective: Vitals:   12/16/19 0353 12/16/19 1239 12/16/19 1404 12/16/19 1415  BP: 124/60 (!) 121/53 (!) 106/56 (!) 114/54  Pulse: (!) 59 (!) 53 (!) 51 (!) 50  Resp: 20 16 14 19   Temp: 98.4 F (36.9 C) 98.3 F (36.8 C)    TempSrc: Oral Axillary    SpO2: 91% 91% 95% 95%  Weight:      Height:        Intake/Output Summary (Last 24 hours) at 12/16/2019 1500 Last data filed at 12/16/2019 1338 Gross per 24 hour  Intake 823.71 ml  Output --  Net 823.71 ml   Filed Weights   12/15/19 0500  Weight: 129 kg    Examination:  General exam: Appears calm and comfortable, chronically ill appearance HEENT: Mild scleral icterus, poor dentition Respiratory system: Clear to auscultation. Respiratory effort normal.  Oxygenating well on room air Cardiovascular system: S1 & S2 heard, RRR. No JVD, murmurs, rubs, gallops or clicks. No pedal edema. Gastrointestinal system: Abdomen is nondistended, soft and nontender. No organomegaly or masses felt. Normal bowel sounds heard. Central nervous system: Alert and oriented. No  focal neurological deficits. Extremities: Symmetric 5 x 5 power. Skin: Mild jaundice, bilateral 1+ pitting edema bilateral lower extremity, right lower extremity with petechia, ecchymosis and bruising Psychiatry: Judgement and insight appear normal. Mood & affect appropriate.     Data Reviewed: I have personally reviewed following labs and imaging studies  CBC: Recent Labs  Lab 12/14/19 1625 12/15/19 0602 12/16/19 0105 12/16/19 0825  WBC 11.6* 9.1 4.3 4.0  NEUTROABS  --  6.7 2.6  --   HGB 16.0 14.9 13.2 13.5  HCT 45.9 42.9  38.4* 38.5*  MCV 97.7 96.0 97.0 98.0  PLT 59* 58* 52* 51*   Basic Metabolic Panel: Recent Labs  Lab 12/14/19 1625 12/15/19 0602 12/16/19 0825  NA 129* 131* 136  K 4.2 3.9 4.1  CL 101 98 104  CO2 21* 21* 25  GLUCOSE 99 89 91  BUN 19 19 21   CREATININE 1.24 1.17 1.25*  CALCIUM 8.3* 8.0* 7.5*  MG  --  1.5*  1.5* 1.5*  PHOS  --  3.0  2.9  --    GFR: Estimated Creatinine Clearance: 81.7 mL/min (A) (by C-G formula based on SCr of 1.25 mg/dL (H)). Liver Function Tests: Recent Labs  Lab 12/14/19 1625 12/14/19 2200 12/15/19 0602 12/16/19 0825  AST 152* 158* 137* 107*  ALT 34 34 33 28  ALKPHOS 66 57 59 44  BILITOT 6.3* 6.6* 5.5* 3.8*  PROT 7.4 7.9 7.0 5.8*  ALBUMIN 2.7* 2.6* 2.5* 2.2*   No results for input(s): LIPASE, AMYLASE in the last 168 hours. Recent Labs  Lab 12/14/19 1625 12/15/19 0602  AMMONIA 54* 49*   Coagulation Profile: Recent Labs  Lab 12/14/19 1625 12/15/19 0602 12/16/19 0105 12/16/19 0825  INR 2.0* 1.9* 1.7* 1.7*   Cardiac Enzymes: No results for input(s): CKTOTAL, CKMB, CKMBINDEX, TROPONINI in the last 168 hours. BNP (last 3 results) No results for input(s): PROBNP in the last 8760 hours. HbA1C: Recent Labs    12/15/19 0602  HGBA1C 6.0*   CBG: Recent Labs  Lab 12/16/19 0004 12/16/19 0348 12/16/19 0423 12/16/19 0757 12/16/19 1135  GLUCAP 146* 60* 100* 88 77   Lipid Profile: Recent Labs    12/15/19 0602  CHOL 123  HDL 16*  LDLCALC 84  TRIG 115  CHOLHDL 7.7   Thyroid Function Tests: Recent Labs    12/15/19 0602  TSH 1.557   Anemia Panel: Recent Labs    12/15/19 0602  FERRITIN 277   Sepsis Labs: No results for input(s): PROCALCITON, LATICACIDVEN in the last 168 hours.  Recent Results (from the past 240 hour(s))  SARS Coronavirus 2 by RT PCR (hospital order, performed in [facility] hospital lab) Nasopharyngeal Nasopharyngeal Swab     Status: None   Collection Time: 12/14/19  9:55 PM   Specimen: Nasopharyngeal  Swab  Result Value Ref Range Status   SARS Coronavirus 2 NEGATIVE NEGATIVE Final    Comment: (NOTE) SARS-CoV-2 target nucleic acids are NOT DETECTED.  The SARS-CoV-2 RNA is generally detectable in upper and lower respiratory specimens during the acute phase of infection. The lowest concentration of SARS-CoV-2 viral copies this assay can detect is 250 copies / mL. A negative result does not preclude SARS-CoV-2 infection and should not be used as the sole basis for treatment or other patient management decisions.  A negative result may occur with improper specimen collection / handling, submission of specimen other than nasopharyngeal swab, presence of viral mutation(s) within the areas targeted by this assay, and inadequate number of  viral copies (<250 copies / mL). A negative result must be combined with clinical observations, patient history, and epidemiological information.  Fact Sheet for Patients:   StrictlyIdeas.no  Fact Sheet for Healthcare Providers: BankingDealers.co.za  This test is not yet approved or  cleared by the [place] FDA and has been authorized for detection and/or diagnosis of SARS-CoV-2 by FDA under an Emergency Use Authorization (EUA).  This EUA will remain in effect (meaning this test can be used) for the duration of the COVID-19 declaration under Section 564(b)(1) of the Act, 21 U.S.C. section 360bbb-3(b)(1), unless the authorization is terminated or revoked sooner.  Performed at [facility], [place] [address], [place], [place] [address]          Radiology Studies: CT Head Wo Contrast  Result Date: 12/14/2019 CLINICAL DATA:  Mental status change EXAM: CT HEAD WITHOUT CONTRAST TECHNIQUE: Contiguous axial images were obtained from the base of the skull through the vertex without intravenous contrast. COMPARISON:  CT brain 09/03/2014 FINDINGS: Brain: No acute territorial infarction, hemorrhage or  intracranial mass. The ventricles are nonenlarged. Vascular: No hyperdense vessels. Scattered carotid vascular calcification Skull: Normal. Negative for fracture or focal lesion. Sinuses/Orbits: No acute finding. Other: None IMPRESSION: Negative non contrasted CT appearance of the brain. Electronically Signed   By: Donavan Foil M.D.   On: 12/14/2019 23:23   US Abdomen Limited  Result Date: 12/14/2019 CLINICAL DATA:  Abdomen pain EXAM: ULTRASOUND ABDOMEN LIMITED RIGHT UPPER QUADRANT COMPARISON:  Ultrasound 09/03/2012 FINDINGS: Gallbladder: Small amount of gallbladder sludge. Shadowing stone measuring up to 1.6 cm. Normal wall thickness. Negative sonographic Murphy. Common bile duct: Diameter: 6 mm Liver: Liver is slightly echogenic. Contour nodularity of the liver. Portal vein is patent on color Doppler imaging with normal direction of blood flow towards the liver. Other: None. IMPRESSION: 1. Sludge and gallstone without sonographic evidence for acute cholecystitis 2. Suspected liver cirrhosis Electronically Signed   By: Donavan Foil M.D.   On: 12/14/2019 21:37   DG CHEST PORT 1 VIEW  Result Date: 12/15/2019 CLINICAL DATA:  GI bleeding EXAM: PORTABLE CHEST 1 VIEW COMPARISON:  04/19/2015 FINDINGS: Hardware in the cervical spine. Post sternotomy changes. Streaky atelectasis left base. No consolidation or pleural effusion. Borderline cardiac size. No pneumothorax. Stable calcified lung nodule in the right upper lobe. IMPRESSION: Streaky atelectasis left base. Electronically Signed   By: Donavan Foil M.D.   On: 12/15/2019 00:34   ECHOCARDIOGRAM COMPLETE  Result Date: 12/15/2019    ECHOCARDIOGRAM REPORT   Patient Name:   BROOKLYN JEFF Date of Exam: 12/15/2019 Medical Rec #:  [id number]      Height:       71.0 in Accession #:    [id number]     Weight:       284.4 lb Date of Birth:  1956-01-03      BSA:          2.449 m Patient Age:    30 years       BP:           134/73 mmHg Patient Gender: M              HR:            59 bpm. Exam Location:  Inpatient Procedure: 2D Echo Indications:    786.09 dyspnea  History:        Patient has no prior history of Echocardiogram examinations.  Prior CABG; Risk Factors:Diabetes and Hypertension.  Sonographer:    Jannett Celestine RDCS (AE) Referring Phys: [place]  Sonographer Comments: Image acquisition challenging due to patient body habitus. IMPRESSIONS  1. Left ventricular ejection fraction, by estimation, is 55 to 60%. The left ventricle has normal function. The left ventricle has no regional wall motion abnormalities. There is moderate left ventricular hypertrophy of the basal-septal segment. Left ventricular diastolic parameters are consistent with Grade II diastolic dysfunction (pseudonormalization). Elevated left atrial pressure.  2. Right ventricular systolic function is normal. The right ventricular size is normal.  3. Left atrial size was mildly dilated.  4. The mitral valve is normal in structure. Mild mitral valve regurgitation. No evidence of mitral stenosis.  5. The aortic valve is tricuspid. Aortic valve regurgitation is not visualized. No aortic stenosis is present. FINDINGS  Left Ventricle: Left ventricular ejection fraction, by estimation, is 55 to 60%. The left ventricle has normal function. The left ventricle has no regional wall motion abnormalities. The left ventricular internal cavity size was normal in size. There is  moderate left ventricular hypertrophy of the basal-septal segment. Left ventricular diastolic parameters are consistent with Grade II diastolic dysfunction (pseudonormalization). Elevated left atrial pressure. Right Ventricle: The right ventricular size is normal.Right ventricular systolic function is normal. Left Atrium: Left atrial size was mildly dilated. Right Atrium: Right atrial size was normal in size. Pericardium: There is no evidence of pericardial effusion. Mitral Valve: The mitral valve is normal in structure.  Normal mobility of the mitral valve leaflets. Mild mitral annular calcification. Mild mitral valve regurgitation. No evidence of mitral valve stenosis. Tricuspid Valve: The tricuspid valve is normal in structure. Tricuspid valve regurgitation is trivial. No evidence of tricuspid stenosis. Aortic Valve: The aortic valve is tricuspid. Aortic valve regurgitation is not visualized. No aortic stenosis is present. Pulmonic Valve: The pulmonic valve was not well visualized. Pulmonic valve regurgitation is not visualized. No evidence of pulmonic stenosis. Aorta: The aortic root is normal in size and structure. Venous: The inferior vena cava was not well visualized.  LEFT VENTRICLE PLAX 2D LVIDd:         4.80 cm  Diastology LVIDs:         3.20 cm  LV e' lateral:   12.00 cm/s LV PW:         1.10 cm  LV E/e' lateral: 8.2 LV IVS:        0.80 cm  LV e' medial:    5.98 cm/s LVOT diam:     2.30 cm  LV E/e' medial:  16.5 LV SV:         106 LV SV Index:   43 LVOT Area:     4.15 cm  RIGHT VENTRICLE RV S prime:     7.72 cm/s TAPSE (M-mode): 1.2 cm LEFT ATRIUM             Index       RIGHT ATRIUM           Index LA diam:        4.10 cm 1.67 cm/m  RA Area:     26.00 cm LA Vol (A2C):   70.9 ml 28.93 ml/m RA Volume:   78.80 ml  32.18 ml/m LA Vol (A4C):   97.8 ml 39.94 ml/m LA Biplane Vol: 85.0 ml 34.71 ml/m  AORTIC VALVE LVOT Vmax:   104.00 cm/s LVOT Vmean:  74.700 cm/s LVOT VTI:    0.256 m  AORTA Ao Root diam: 3.00  cm MITRAL VALVE MV Area (PHT): 2.83 cm    SHUNTS MV Decel Time: 268 msec    Systemic VTI:  0.26 m MV E velocity: 98.50 cm/s  Systemic Diam: 2.30 cm MV A velocity: 83.10 cm/s MV E/A ratio:  1.19 Kirk Ruths MD Electronically signed by Kirk Ruths MD Signature Date/Time: 12/15/2019/2:12:53 PM    Final         Scheduled Meds: . dextrose      . insulin aspart  0-9 Units Subcutaneous Q4H  . lactulose  30 g Oral TID  . metoprolol tartrate  50 mg Oral BID  . montelukast  10 mg Oral QHS  . [START ON 12/18/2019]  pantoprazole  40 mg Intravenous Q12H  . sodium chloride flush  3 mL Intravenous Q12H  . spironolactone  25 mg Oral BID   Continuous Infusions: . cefTRIAXone (ROCEPHIN)  IV Stopped (12/16/19 0700)  . octreotide  (SANDOSTATIN)    IV infusion 50 mcg/hr (12/16/19 1338)  . pantoprozole (PROTONIX) infusion 8 mg/hr (12/16/19 1338)     LOS: 2 days    Time spent: 38 minutes spent on chart review, discussion with nursing staff, consultants, updating family and interview/physical exam; more than 50% of that time was spent in counseling and/or coordination of care.    Luanne Krzyzanowski J [place] DO [facility] Available via Epic secure chat 7am-7pm After these hours, please refer to coverage provider listed on [email or web address] 12/16/2019, 3:00 PM

## 2019-12-16 NOTE — Plan of Care (Signed)

## 2019-12-17 DIAGNOSIS — K766 Portal hypertension: Secondary | ICD-10-CM

## 2019-12-17 DIAGNOSIS — I8500 Esophageal varices without bleeding: Secondary | ICD-10-CM

## 2019-12-17 DIAGNOSIS — K259 Gastric ulcer, unspecified as acute or chronic, without hemorrhage or perforation: Secondary | ICD-10-CM

## 2019-12-17 LAB — GLUCOSE, CAPILLARY
Glucose-Capillary: 105 mg/dL — ABNORMAL HIGH (ref 70–99)
Glucose-Capillary: 113 mg/dL — ABNORMAL HIGH (ref 70–99)
Glucose-Capillary: 153 mg/dL — ABNORMAL HIGH (ref 70–99)
Glucose-Capillary: 163 mg/dL — ABNORMAL HIGH (ref 70–99)

## 2019-12-17 LAB — COMPREHENSIVE METABOLIC PANEL
ALT: 24 U/L (ref 0–44)
AST: 131 U/L — ABNORMAL HIGH (ref 15–41)
Albumin: 2 g/dL — ABNORMAL LOW (ref 3.5–5.0)
Alkaline Phosphatase: 42 U/L (ref 38–126)
Anion gap: 11 (ref 5–15)
BUN: 15 mg/dL (ref 8–23)
CO2: 20 mmol/L — ABNORMAL LOW (ref 22–32)
Calcium: 7.1 mg/dL — ABNORMAL LOW (ref 8.9–10.3)
Chloride: 103 mmol/L (ref 98–111)
Creatinine, Ser: 1.01 mg/dL (ref 0.61–1.24)
GFR calc Af Amer: >60 mL/min (ref >60)
GFR calc non Af Amer: >60 mL/min (ref >60)
Glucose, Bld: 105 mg/dL — ABNORMAL HIGH (ref 70–99)
Potassium: 4.8 mmol/L (ref 3.5–5.1)
Sodium: 134 mmol/L — ABNORMAL LOW (ref 135–145)
Total Bilirubin: 3.8 mg/dL — ABNORMAL HIGH (ref 0.3–1.2)
Total Protein: 5.4 g/dL — ABNORMAL LOW (ref 6.5–8.1)

## 2019-12-17 LAB — CBC
HCT: 39.8 % (ref 39.0–52.0)
Hemoglobin: 13.7 g/dL (ref 13.0–17.0)
MCH: 33.7 pg (ref 26.0–34.0)
MCHC: 34.4 g/dL (ref 30.0–36.0)
MCV: 97.8 fL (ref 80.0–100.0)
Platelets: 40 10*3/uL — ABNORMAL LOW (ref 150–400)
RBC: 4.07 MIL/uL — ABNORMAL LOW (ref 4.22–5.81)
RDW: 16.6 % — ABNORMAL HIGH (ref 11.5–15.5)
WBC: 3.7 10*3/uL — ABNORMAL LOW (ref 4.0–10.5)
nRBC: 0 % (ref 0.0–0.2)

## 2019-12-17 LAB — MAGNESIUM: Magnesium: 1.6 mg/dL — ABNORMAL LOW (ref 1.7–2.4)

## 2019-12-17 MED ORDER — MAGNESIUM SULFATE 4 GM/100ML IV SOLN
4.0000 g | Freq: Once | INTRAVENOUS | Status: AC
  Administered 2019-12-17: 4 g via INTRAVENOUS
  Filled 2019-12-17: qty 100

## 2019-12-17 NOTE — Plan of Care (Signed)

## 2019-12-17 NOTE — Progress Notes (Signed)
[facility] Progress Note  Subjective: No complaints today.  Endoscopic findings from EGD yesterday reviewed.  Objective: Vital signs in last 24 hours: Temp:  [98 F (36.7 C)-98.7 F (37.1 C)] 98.7 F (37.1 C) (08/07 0745) Pulse Rate:  [50-61] 59 (08/07 0745) Resp:  [14-19] 17 (08/07 0745) BP: (106-127)/(53-65) 127/65 (08/07 0745) SpO2:  [90 %-95 %] 90 % (08/07 0745) Weight change:    PE:  No distress  Heart regular rhythm  Abdomen soft nontender  Lab Results: Results for orders placed or performed during the hospital encounter of 12/14/19 (from the past 24 hour(s))  Glucose, capillary     Status: None   Collection Time: 12/16/19 11:35 AM  Result Value Ref Range   Glucose-Capillary 77 70 - 99 mg/dL  Glucose, capillary     Status: Abnormal   Collection Time: 12/16/19  5:18 PM  Result Value Ref Range   Glucose-Capillary 184 (H) 70 - 99 mg/dL  Glucose, capillary     Status: Abnormal   Collection Time: 12/16/19  8:54 PM  Result Value Ref Range   Glucose-Capillary 122 (H) 70 - 99 mg/dL  Glucose, capillary     Status: Abnormal   Collection Time: 12/16/19 11:28 PM  Result Value Ref Range   Glucose-Capillary 114 (H) 70 - 99 mg/dL  Glucose, capillary     Status: Abnormal   Collection Time: 12/17/19  3:57 AM  Result Value Ref Range   Glucose-Capillary 105 (H) 70 - 99 mg/dL  CBC     Status: Abnormal   Collection Time: 12/17/19  4:45 AM  Result Value Ref Range   WBC 3.7 (L) 4.0 - 10.5 K/uL   RBC 4.07 (L) 4.22 - 5.81 MIL/uL   Hemoglobin 13.7 13.0 - 17.0 g/dL   HCT 39.8 39 - 52 %   MCV 97.8 80.0 - 100.0 fL   MCH 33.7 26.0 - 34.0 pg   MCHC 34.4 30.0 - 36.0 g/dL   RDW 16.6 (H) 11.5 - 15.5 %   Platelets 40 (L) 150 - 400 K/uL   nRBC 0.0 0.0 - 0.2 %  Comprehensive metabolic panel     Status: Abnormal   Collection Time: 12/17/19  4:45 AM  Result Value Ref Range   Sodium 134 (L) 135 - 145 mmol/L   Potassium 4.8 3.5 - 5.1 mmol/L   Chloride 103 98 - 111 mmol/L    CO2 20 (L) 22 - 32 mmol/L   Glucose, Bld 105 (H) 70 - 99 mg/dL   BUN 15 8 - 23 mg/dL   Creatinine, Ser 1.01 0.61 - 1.24 mg/dL   Calcium 7.1 (L) 8.9 - 10.3 mg/dL   Total Protein 5.4 (L) 6.5 - 8.1 g/dL   Albumin 2.0 (L) 3.5 - 5.0 g/dL   AST 131 (H) 15 - 41 U/L   ALT 24 0 - 44 U/L   Alkaline Phosphatase 42 38 - 126 U/L   Total Bilirubin 3.8 (H) 0.3 - 1.2 mg/dL   GFR calc non Af Amer >60 >60 mL/min   GFR calc Af Amer >60 >60 mL/min   Anion gap 11 5 - 15  Magnesium     Status: Abnormal   Collection Time: 12/17/19  4:45 AM  Result Value Ref Range   Magnesium 1.6 (L) 1.7 - 2.4 mg/dL  Glucose, capillary     Status: Abnormal   Collection Time: 12/17/19  8:47 AM  Result Value Ref Range   Glucose-Capillary 113 (H) 70 - 99 mg/dL  Studies/Results: No results found.    Assessment: Cirrhosis of liver  Duodenal ulcers    Plan:   Continue current management.  Advance diet as tolerated.  We will sign off at this point.  Patient can follow-up with Dr. Michail Sermon as an outpatient after discharge.    Randall Frazier 12/17/2019, 11:27 AM  Pager: [phone] If no answer or after 5 PM call [phone]

## 2019-12-17 NOTE — Progress Notes (Signed)
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  Patient also noted skin discoloration to yellow with yellow sclera and increased edema to lower extremities.  Patient recently establish care with gastroenterology at the [facility] and was recently started on the spironolactone.  He reports previous liver biopsy in the [place], roughly 1994 and does not report any significant findings.  In the ED, sodium 129, potassium 4.2, BUN 19, creatinine 1.24, AST 152, ALT 34, ammonia level 54.  Total bilirubin 6.3.  WBC 11.6, hemoglobin 16.0, platelets 59.  INR 2.0.  HEENT negative.  Chest x-ray with streaky atelectasis left base.  CT head without acute intracranial abnormality.  Case was discussed by ED physician with GI, Dr. Watt Climes about concern for upper GI bleed versus decompensated cirrhosis; recommended hospitalist admission with a GI to follow in the a.m.  Assessment & Plan:   Active Problems:   Melena   DM2 (diabetes mellitus, type 2) (HCC)   Decompensated hepatic cirrhosis (HCC)   CAD (coronary artery disease)   Hyperlipemia   Essential hypertension   Dyspnea   Thrombocytopenia (HCC)   AKI (acute kidney injury) (HCC)   Hypoalbuminemia   Acute hepatic encephalopathy   Hyponatremia   Cellulitis of right leg   Portal hypertension with esophageal varices (HCC)   Duodenal ulcer   Gastric ulcer   Gastritis   Upper GI bleed 2/2 esophageal varices, gastritis, gastric/duodenal ulcers Patient presenting with 3-day history of reported dark tarry stools.  Hemoglobin 14.9 on admission with normal BUN.  Denies NSAID abuse, reports regular Tylenol use  and occasional EtOH use.  Seems to have been recently diagnosed with cirrhosis.  FOBT negative.  Considerations are for gastric ulcer/gastritis versus esophageal varices in the setting of cirrhosis. EGD on 12/16/2019 notable for grade 1 varices distal esophagus, moderate portal hypertensive gastropathy, bleeding gastric ulcer, several nonbleeding duodenal ulcer. [facility] consulted, signed off 8/7 w/ recommendations for outpatient follow-up with Dr. Michail Sermon. --Continue to holding home aspirin --Continue Protonix drip; transition to Protonix 40 mg IV q. 12h tomorrow --Continue octreotide drip; will discontinue tomorrow --Continue to monitor hemoglobin daily  Decompensated cirrhosis Hx NAFLD Recently established care with gastroenterology at the [facility] and started on spironolactone 25 mg p.o. twice daily.  Right upper quadrant ultrasound notes liver slightly echogenic with contour nodularity and portal vein patent on color Doppler imaging.  Acute hepatitis panel negative.  Received IV albumin on admission. --GI following as above --MELD-NA on admission = 27; down to 21  --MDF = 32.2; down to 21.7  --Continue spironolactone 25 mg p.o. twice daily --Strict I's and O's and daily weights  Acute renal failure Patient presenting with elevated creatinine of 1.24 with this baseline 0.90 April 2016.  Etiology likely secondary to hepatorenal syndrome versus dehydration in the setting of recent spironolactone initiation. --Cr 1.24>1.17>1.25>1.01 --Continue to monitor renal function closely daily  Acute hepatic encephalopathy Patient presenting with 3-day progressive confusion.  CT head with no acute intracranial abnormality.  Ammonia level elevated at 54 on admission.  EtOH level less than 5. --Mentation appears to have returned back to his normal baseline --Continue lactulose 30g PO TID, titrate for 2-3  soft BMs daily  Right lower extremity cellulitis Patient with right lower extremity erythema.  WBC  count 9.1. --Continue antibiotics with ceftriaxone --Follow CBC daily  Thrombocytopenia Platelets 58 on admission, likely function of poor synthetic function due to underlying cirrhosis.  Received vitamin K 5 mg p.o. and vitamin K 10 mg IV and 2 unit platelets on admission. --Holding home aspirin --Follow CBC daily  Hyponatremia Sodium 129 on admission with urine osmolality 717, urine sodium 32, urine creatinine 183. --Na 129>131>136>134 --Monitor electrolytes daily  Essential hypertension --Continue metoprolol tartrate 50 mg p.o. twice daily  T2DM Hemoglobin A1c 6.0. On Metformin 1000 mg p.o. twice daily at home. --Hold oral hypoglycemics while inpatient --Insulin sliding scale for further coverage --CBG's before every meal/at bedtime  CAD s/p CABG Previously on atorvastatin which has been discontinued due to cirrhosis.  TTE with LVEF 55-60%, moderate LVH, grade 2 diastolic dysfunction, LA mildly dilated, mild MR. --hold home aspirin in the setting of thrombocytopenia and gastric/duodenal ulcers  Hx Gout: On allopurinol outpatient.  Left sphenoid wing meningioma Follows with [facility] neurosurgery.  Status post gamma knife surgery on 08/03/2018.  Seen in 66-monthfollow-up last on 02/16/2019.  Follow-up MRI notable for no growth and no concerning changes and recommends repeat follow-up 1 year with repeat MRI.    DVT prophylaxis: SCD's Code Status: FULL Code Family Communication: None present at bedside this morning; updated patient spouse CArbie Cookeyvia telephone this afternoon  Disposition Plan:  Status is: Inpatient  Remains inpatient appropriate because:Persistent severe electrolyte disturbances, Altered mental status, Ongoing diagnostic testing needed not appropriate for outpatient work up, Unsafe d/c plan and IV treatments appropriate due to intensity of illness or inability to take PO   Dispo: The patient is from: Home              Anticipated d/c is to: Home               Anticipated d/c date is: 3 days              Patient currently is not medically stable to d/c.    Consultants:   [facility] Dr. OPaulita Fujita Dr. SMichail Sermon Procedures:   EGD 12/16/2019  Antimicrobials:   Ceftriaxone 8/4>>   Subjective: Patient seen and examined bedside, resting comfortably.  No complaints this morning.  Continues on Protonix and octreotide drip.  No further recommendations per gastroenterology and signed off with outpatient follow-up with Dr. SMichail Sermonrecommended.  Hemoglobin remains stable. Denies headache, no visual changes, no chest pain, no palpitations, no shortness of breath, no abdominal pain, no weakness, no fatigue, no paresthesias.  No acute events overnight per nursing staff.  Objective: Vitals:   12/16/19 1501 12/16/19 1944 12/17/19 0300 12/17/19 0745  BP: 126/62 (!) 110/58 (!) 107/53 127/65  Pulse: (!) 52 (!) 55 61 (!) 59  Resp: 18 16 16 17   Temp: 98 F (36.7 C) 98.3 F (36.8 C) 98.4 F (36.9 C) 98.7 F (37.1 C)  TempSrc: Oral Oral Oral Oral  SpO2: 92% 93% 94% 90%  Weight:      Height:        Intake/Output Summary (Last 24 hours) at 12/17/2019 1358 Last data filed at 12/17/2019 0830 Gross per 24 hour  Intake 240 ml  Output 1450 ml  Net -1210 ml   Filed Weights   12/15/19 0500  Weight: 129 kg    Examination:  General exam: Appears calm and comfortable, chronically ill appearance HEENT: Mild scleral icterus,  poor dentition Respiratory system: Clear to auscultation. Respiratory effort normal.  Oxygenating well on room air Cardiovascular system: S1 & S2 heard, RRR. No JVD, murmurs, rubs, gallops or clicks. No pedal edema. Gastrointestinal system: Abdomen is nondistended, soft and nontender. No organomegaly or masses felt. Normal bowel sounds heard. Central nervous system: Alert and oriented. No focal neurological deficits. Extremities: Symmetric 5 x 5 power. Skin: Mild jaundice, bilateral 1+ pitting edema bilateral lower extremity,  right lower extremity with petechia, ecchymosis and bruising Psychiatry: Judgement and insight appear normal. Mood & affect appropriate.     Data Reviewed: I have personally reviewed following labs and imaging studies  CBC: Recent Labs  Lab 12/14/19 1625 12/15/19 0602 12/16/19 0105 12/16/19 0825 12/17/19 0445  WBC 11.6* 9.1 4.3 4.0 3.7*  NEUTROABS  --  6.7 2.6  --   --   HGB 16.0 14.9 13.2 13.5 13.7  HCT 45.9 42.9 38.4* 38.5* 39.8  MCV 97.7 96.0 97.0 98.0 97.8  PLT 59* 58* 52* 51* 40*   Basic Metabolic Panel: Recent Labs  Lab 12/14/19 1625 12/15/19 0602 12/16/19 0825 12/17/19 0445  NA 129* 131* 136 134*  K 4.2 3.9 4.1 4.8  CL 101 98 104 103  CO2 21* 21* 25 20*  GLUCOSE 99 89 91 105*  BUN 19 19 21 15   CREATININE 1.24 1.17 1.25* 1.01  CALCIUM 8.3* 8.0* 7.5* 7.1*  MG  --  1.5*  1.5* 1.5* 1.6*  PHOS  --  3.0  2.9  --   --    GFR: Estimated Creatinine Clearance: 101.2 mL/min (by C-G formula based on SCr of 1.01 mg/dL). Liver Function Tests: Recent Labs  Lab 12/14/19 1625 12/14/19 2200 12/15/19 0602 12/16/19 0825 12/17/19 0445  AST 152* 158* 137* 107* 131*  ALT 34 34 33 28 24  ALKPHOS 66 57 59 44 42  BILITOT 6.3* 6.6* 5.5* 3.8* 3.8*  PROT 7.4 7.9 7.0 5.8* 5.4*  ALBUMIN 2.7* 2.6* 2.5* 2.2* 2.0*   No results for input(s): LIPASE, AMYLASE in the last 168 hours. Recent Labs  Lab 12/14/19 1625 12/15/19 0602  AMMONIA 54* 49*   Coagulation Profile: Recent Labs  Lab 12/14/19 1625 12/15/19 0602 12/16/19 0105 12/16/19 0825  INR 2.0* 1.9* 1.7* 1.7*   Cardiac Enzymes: No results for input(s): CKTOTAL, CKMB, CKMBINDEX, TROPONINI in the last 168 hours. BNP (last 3 results) No results for input(s): PROBNP in the last 8760 hours. HbA1C: Recent Labs    12/15/19 0602  HGBA1C 6.0*   CBG: Recent Labs  Lab 12/16/19 2054 12/16/19 2328 12/17/19 0357 12/17/19 0847 12/17/19 1202  GLUCAP 122* 114* 105* 113* 153*   Lipid Profile: Recent Labs     12/15/19 0602  CHOL 123  HDL 16*  LDLCALC 84  TRIG 115  CHOLHDL 7.7   Thyroid Function Tests: Recent Labs    12/15/19 0602  TSH 1.557   Anemia Panel: Recent Labs    12/15/19 0602  FERRITIN 277   Sepsis Labs: No results for input(s): PROCALCITON, LATICACIDVEN in the last 168 hours.  Recent Results (from the past 240 hour(s))  SARS Coronavirus 2 by RT PCR (hospital order, performed in [facility] hospital lab) Nasopharyngeal Nasopharyngeal Swab     Status: None   Collection Time: 12/14/19  9:55 PM   Specimen: Nasopharyngeal Swab  Result Value Ref Range Status   SARS Coronavirus 2 NEGATIVE NEGATIVE Final    Comment: (NOTE) SARS-CoV-2 target nucleic acids are NOT DETECTED.  The SARS-CoV-2 RNA is generally  detectable in upper and lower respiratory specimens during the acute phase of infection. The lowest concentration of SARS-CoV-2 viral copies this assay can detect is 250 copies / mL. A negative result does not preclude SARS-CoV-2 infection and should not be used as the sole basis for treatment or other patient management decisions.  A negative result may occur with improper specimen collection / handling, submission of specimen other than nasopharyngeal swab, presence of viral mutation(s) within the areas targeted by this assay, and inadequate number of viral copies (<250 copies / mL). A negative result must be combined with clinical observations, patient history, and epidemiological information.  Fact Sheet for Patients:   StrictlyIdeas.no  Fact Sheet for Healthcare Providers: BankingDealers.co.za  This test is not yet approved or  cleared by the [place] FDA and has been authorized for detection and/or diagnosis of SARS-CoV-2 by FDA under an Emergency Use Authorization (EUA).  This EUA will remain in effect (meaning this test can be used) for the duration of the COVID-19 declaration under Section 564(b)(1) of the  Act, 21 U.S.C. section 360bbb-3(b)(1), unless the authorization is terminated or revoked sooner.  Performed at [facility], [place] [address], [place], [place] [address]          Radiology Studies: No results found.      Scheduled Meds: . insulin aspart  0-9 Units Subcutaneous Q4H  . lactulose  30 g Oral TID  . metoprolol tartrate  50 mg Oral BID  . montelukast  10 mg Oral QHS  . [START ON 12/18/2019] pantoprazole  40 mg Intravenous Q12H  . sodium chloride flush  3 mL Intravenous Q12H  . spironolactone  25 mg Oral BID   Continuous Infusions: . cefTRIAXone (ROCEPHIN)  IV 2 g (12/16/19 2112)  . octreotide  (SANDOSTATIN)    IV infusion 50 mcg/hr (12/16/19 1338)  . pantoprozole (PROTONIX) infusion 8 mg/hr (12/16/19 1338)     LOS: 3 days    Time spent: 38 minutes spent on chart review, discussion with nursing staff, consultants, updating family and interview/physical exam; more than 50% of that time was spent in counseling and/or coordination of care.    Tomy Khim J [place] DO [facility] Available via Epic secure chat 7am-7pm After these hours, please refer to coverage provider listed on [email or web address] 12/17/2019, 1:58 PM

## 2019-12-18 LAB — COMPREHENSIVE METABOLIC PANEL
ALT: 31 U/L (ref 0–44)
AST: 101 U/L — ABNORMAL HIGH (ref 15–41)
Albumin: 2.1 g/dL — ABNORMAL LOW (ref 3.5–5.0)
Alkaline Phosphatase: 49 U/L (ref 38–126)
Anion gap: 8 (ref 5–15)
BUN: 11 mg/dL (ref 8–23)
CO2: 22 mmol/L (ref 22–32)
Calcium: 7.2 mg/dL — ABNORMAL LOW (ref 8.9–10.3)
Chloride: 104 mmol/L (ref 98–111)
Creatinine, Ser: 0.98 mg/dL (ref 0.61–1.24)
GFR calc Af Amer: >60 mL/min (ref >60)
GFR calc non Af Amer: >60 mL/min (ref >60)
Glucose, Bld: 103 mg/dL — ABNORMAL HIGH (ref 70–99)
Potassium: 3.6 mmol/L (ref 3.5–5.1)
Sodium: 134 mmol/L — ABNORMAL LOW (ref 135–145)
Total Bilirubin: 3.5 mg/dL — ABNORMAL HIGH (ref 0.3–1.2)
Total Protein: 5.8 g/dL — ABNORMAL LOW (ref 6.5–8.1)

## 2019-12-18 LAB — CBC
HCT: 39.2 % (ref 39.0–52.0)
Hemoglobin: 13.5 g/dL (ref 13.0–17.0)
MCH: 33.4 pg (ref 26.0–34.0)
MCHC: 34.4 g/dL (ref 30.0–36.0)
MCV: 97 fL (ref 80.0–100.0)
Platelets: 65 10*3/uL — ABNORMAL LOW (ref 150–400)
RBC: 4.04 MIL/uL — ABNORMAL LOW (ref 4.22–5.81)
RDW: 16.3 % — ABNORMAL HIGH (ref 11.5–15.5)
WBC: 4.2 10*3/uL (ref 4.0–10.5)
nRBC: 0.5 % — ABNORMAL HIGH (ref 0.0–0.2)

## 2019-12-18 LAB — PROTIME-INR
INR: 1.9 — ABNORMAL HIGH (ref 0.8–1.2)
Prothrombin Time: 21.2 seconds — ABNORMAL HIGH (ref 11.4–15.2)

## 2019-12-18 LAB — GLUCOSE, CAPILLARY
Glucose-Capillary: 113 mg/dL — ABNORMAL HIGH (ref 70–99)
Glucose-Capillary: 118 mg/dL — ABNORMAL HIGH (ref 70–99)
Glucose-Capillary: 119 mg/dL — ABNORMAL HIGH (ref 70–99)
Glucose-Capillary: 156 mg/dL — ABNORMAL HIGH (ref 70–99)
Glucose-Capillary: 90 mg/dL (ref 70–99)
Glucose-Capillary: 92 mg/dL (ref 70–99)
Glucose-Capillary: 93 mg/dL (ref 70–99)

## 2019-12-18 LAB — MAGNESIUM: Magnesium: 1.7 mg/dL (ref 1.7–2.4)

## 2019-12-18 NOTE — Progress Notes (Signed)
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  Patient also noted skin discoloration to yellow with yellow sclera and increased edema to lower extremities.  Patient recently establish care with gastroenterology at the [facility] and was recently started on the spironolactone.  He reports previous liver biopsy in the [place], roughly 1994 and does not report any significant findings.  In the ED, sodium 129, potassium 4.2, BUN 19, creatinine 1.24, AST 152, ALT 34, ammonia level 54.  Total bilirubin 6.3.  WBC 11.6, hemoglobin 16.0, platelets 59.  INR 2.0.  HEENT negative.  Chest x-ray with streaky atelectasis left base.  CT head without acute intracranial abnormality.  Case was discussed by ED physician with GI, Dr. Watt Climes about concern for upper GI bleed versus decompensated cirrhosis; recommended hospitalist admission with a GI to follow in the a.m.  Assessment & Plan:   Active Problems:   Melena   DM2 (diabetes mellitus, type 2) (HCC)   Decompensated hepatic cirrhosis (HCC)   CAD (coronary artery disease)   Hyperlipemia   Essential hypertension   Dyspnea   Thrombocytopenia (HCC)   AKI (acute kidney injury) (HCC)   Hypoalbuminemia   Acute hepatic encephalopathy   Hyponatremia   Cellulitis of right leg   Portal hypertension with esophageal varices (HCC)   Duodenal ulcer   Gastric ulcer   Gastritis   Upper GI bleed 2/2 esophageal varices, gastritis, gastric/duodenal ulcers Patient presenting with 3-day history of reported dark tarry stools.  Hemoglobin 14.9 on admission with normal BUN.  Denies NSAID abuse, reports regular Tylenol use  and occasional EtOH use.  Seems to have been recently diagnosed with cirrhosis.  FOBT negative.  Considerations are for gastric ulcer/gastritis versus esophageal varices in the setting of cirrhosis. EGD on 12/16/2019 notable for grade 1 varices distal esophagus, moderate portal hypertensive gastropathy, bleeding gastric ulcer, several nonbleeding duodenal ulcer. [facility] consulted, signed off 8/7 w/ recommendations for outpatient follow-up with Dr. Michail Sermon. --Continue to holding home aspirin --Protonix drip transitioned to Protonix 40 mg IV q. 12h  --Discontinue octreotide drip --Continue to monitor hemoglobin daily  Decompensated cirrhosis Hx NAFLD Recently established care with gastroenterology at the [facility] and started on spironolactone 25 mg p.o. twice daily.  Right upper quadrant ultrasound notes liver slightly echogenic with contour nodularity and portal vein patent on color Doppler imaging.  Acute hepatitis panel negative.  Received IV albumin on admission. --GI following as above --MELD-NA on admission = 27; down to 21  --MDF = 32.2; down to 21.7  --Continue spironolactone 25 mg p.o. twice daily --Strict I's and O's and daily weights  Acute renal failure Patient presenting with elevated creatinine of 1.24 with this baseline 0.90 April 2016.  Etiology likely secondary to hepatorenal syndrome versus dehydration in the setting of recent spironolactone initiation. --Cr 1.24>1.17>1.25>1.01>0.98 --Continue to monitor renal function closely daily  Acute hepatic encephalopathy Patient presenting with 3-day progressive confusion.  CT head with no acute intracranial abnormality.  Ammonia level elevated at 54 on admission.  EtOH level less than 5. --Mentation appears to have returned back to his normal baseline --Continue lactulose 30g PO TID, titrate for 2-3 soft BMs daily  Right lower extremity cellulitis Patient with right lower extremity erythema.  WBC count 9.1. --Continue antibiotics with  ceftriaxone --Follow CBC daily  Thrombocytopenia Platelets 58 on admission, likely function of poor synthetic function due to underlying cirrhosis.  Received vitamin K 5 mg p.o. and vitamin K 10 mg IV and 2 unit platelets on admission. --Holding home aspirin --Follow CBC daily  Hyponatremia Sodium 129 on admission with urine osmolality 717, urine sodium 32, urine creatinine 183. --Na 129>131>136>134 --Monitor electrolytes daily  Essential hypertension --Continue metoprolol tartrate 50 mg p.o. twice daily  T2DM Hemoglobin A1c 6.0. On Metformin 1000 mg p.o. twice daily at home. --Hold oral hypoglycemics while inpatient --Insulin sliding scale for further coverage --CBG's before every meal/at bedtime  CAD s/p CABG Previously on atorvastatin which has been discontinued due to cirrhosis.  TTE with LVEF 55-60%, moderate LVH, grade 2 diastolic dysfunction, LA mildly dilated, mild MR. --hold home aspirin in the setting of thrombocytopenia and gastric/duodenal ulcers  Hx Gout: On allopurinol outpatient.  Left sphenoid wing meningioma Follows with [facility]surgery.  Status post gamma knife surgery on 08/03/2018.  Seen in 72-monthfollow-up last on 02/16/2019.  Follow-up MRI notable for no growth and no concerning changes and recommends repeat follow-up 1 year with repeat MRI.    DVT prophylaxis: SCD's Code Status: Full Code Family Communication: None present at bedside this morning; updated patient spouse CArbie Cookeyvia telephone this afternoon  Disposition Plan:  Status is: Inpatient  Remains inpatient appropriate because:Persistent severe electrolyte disturbances, Altered mental status, Ongoing diagnostic testing needed not appropriate for outpatient work up, Unsafe d/c plan and IV treatments appropriate due to intensity of illness or inability to take PO   Dispo: The patient is from: Home              Anticipated d/c is to: Home              Anticipated d/c date is: 1  day              Patient currently is not medically stable to d/c.    Consultants:   [facility] - Dr. OPaulita Fujita Dr. SMichail Sermon Procedures:   EGD 12/16/2019  Antimicrobials:   Ceftriaxone 8/4>>   Subjective: Patient seen and examined bedside, resting comfortably.  No complaints this morning.  Discontinued octreotide and Protonix drip this morning, continues on Protonix 40 mg IV twice daily.  Hemoglobin remains stable. Denies headache, no visual changes, no chest pain, no palpitations, no shortness of breath, no abdominal pain, no weakness, no fatigue, no paresthesias.  No acute events overnight per nursing staff.  Objective: Vitals:   12/17/19 1410 12/17/19 1932 12/18/19 0338 12/18/19 0819  BP: (!) 141/72 (!) 146/75 125/67 (!) 143/73  Pulse: (!) 53 (!) 56 (!) 55 (!) 54  Resp: 17 17 16 17   Temp: 98.3 F (36.8 C) 98.6 F (37 C) 98.2 F (36.8 C) 97.8 F (36.6 C)  TempSrc: Oral Oral Oral Oral  SpO2: 90% 91% (!) 89% 92%  Weight:      Height:        Intake/Output Summary (Last 24 hours) at 12/18/2019 1315 Last data filed at 12/18/2019 0820 Gross per 24 hour  Intake 440 ml  Output 250 ml  Net 190 ml   Filed Weights   12/15/19 0500  Weight: 129 kg    Examination:  General exam: Appears calm and comfortable, chronically ill appearance HEENT: Mild scleral icterus, poor dentition Respiratory system: Clear to auscultation. Respiratory  effort normal.  Oxygenating well on room air Cardiovascular system: S1 & S2 heard, RRR. No JVD, murmurs, rubs, gallops or clicks. No pedal edema. Gastrointestinal system: Abdomen is nondistended, soft and nontender. No organomegaly or masses felt. Normal bowel sounds heard. Central nervous system: Alert and oriented. No focal neurological deficits. Extremities: Symmetric 5 x 5 power. Skin: Mild jaundice, bilateral 1+ pitting edema bilateral lower extremity, right lower extremity with petechia/erythema, ecchymosis and bruising Psychiatry: Judgement  and insight appear normal. Mood & affect appropriate.     Data Reviewed: I have personally reviewed following labs and imaging studies  CBC: Recent Labs  Lab 12/15/19 0602 12/16/19 0105 12/16/19 0825 12/17/19 0445 12/18/19 0244  WBC 9.1 4.3 4.0 3.7* 4.2  NEUTROABS 6.7 2.6  --   --   --   HGB 14.9 13.2 13.5 13.7 13.5  HCT 42.9 38.4* 38.5* 39.8 39.2  MCV 96.0 97.0 98.0 97.8 97.0  PLT 58* 52* 51* 40* 65*   Basic Metabolic Panel: Recent Labs  Lab 12/14/19 1625 12/15/19 0602 12/16/19 0825 12/17/19 0445 12/18/19 0244  NA 129* 131* 136 134* 134*  K 4.2 3.9 4.1 4.8 3.6  CL 101 98 104 103 104  CO2 21* 21* 25 20* 22  GLUCOSE 99 89 91 105* 103*  BUN 19 19 21 15 11   CREATININE 1.24 1.17 1.25* 1.01 0.98  CALCIUM 8.3* 8.0* 7.5* 7.1* 7.2*  MG  --  1.5*  1.5* 1.5* 1.6* 1.7  PHOS  --  3.0  2.9  --   --   --    GFR: Estimated Creatinine Clearance: 104.3 mL/min (by C-G formula based on SCr of 0.98 mg/dL). Liver Function Tests: Recent Labs  Lab 12/14/19 2200 12/15/19 0602 12/16/19 0825 12/17/19 0445 12/18/19 0244  AST 158* 137* 107* 131* 101*  ALT 34 33 28 24 31   ALKPHOS 57 59 44 42 49  BILITOT 6.6* 5.5* 3.8* 3.8* 3.5*  PROT 7.9 7.0 5.8* 5.4* 5.8*  ALBUMIN 2.6* 2.5* 2.2* 2.0* 2.1*   No results for input(s): LIPASE, AMYLASE in the last 168 hours. Recent Labs  Lab 12/14/19 1625 12/15/19 0602  AMMONIA 54* 49*   Coagulation Profile: Recent Labs  Lab 12/14/19 1625 12/15/19 0602 12/16/19 0105 12/16/19 0825 12/18/19 0244  INR 2.0* 1.9* 1.7* 1.7* 1.9*   Cardiac Enzymes: No results for input(s): CKTOTAL, CKMB, CKMBINDEX, TROPONINI in the last 168 hours. BNP (last 3 results) No results for input(s): PROBNP in the last 8760 hours. HbA1C: No results for input(s): HGBA1C in the last 72 hours. CBG: Recent Labs  Lab 12/17/19 2007 12/18/19 0024 12/18/19 0341 12/18/19 0815 12/18/19 1214  GLUCAP 119* 118* 92 90 93   Lipid Profile: No results for input(s): CHOL,  HDL, LDLCALC, TRIG, CHOLHDL, LDLDIRECT in the last 72 hours. Thyroid Function Tests: No results for input(s): TSH, T4TOTAL, FREET4, T3FREE, THYROIDAB in the last 72 hours. Anemia Panel: No results for input(s): VITAMINB12, FOLATE, FERRITIN, TIBC, IRON, RETICCTPCT in the last 72 hours. Sepsis Labs: No results for input(s): PROCALCITON, LATICACIDVEN in the last 168 hours.  Recent Results (from the past 240 hour(s))  SARS Coronavirus 2 by RT PCR (hospital order, performed in [facility] hospital lab) Nasopharyngeal Nasopharyngeal Swab     Status: None   Collection Time: 12/14/19  9:55 PM   Specimen: Nasopharyngeal Swab  Result Value Ref Range Status   SARS Coronavirus 2 NEGATIVE NEGATIVE Final    Comment: (NOTE) SARS-CoV-2 target nucleic acids are NOT DETECTED.  The SARS-CoV-2  RNA is generally detectable in upper and lower respiratory specimens during the acute phase of infection. The lowest concentration of SARS-CoV-2 viral copies this assay can detect is 250 copies / mL. A negative result does not preclude SARS-CoV-2 infection and should not be used as the sole basis for treatment or other patient management decisions.  A negative result may occur with improper specimen collection / handling, submission of specimen other than nasopharyngeal swab, presence of viral mutation(s) within the areas targeted by this assay, and inadequate number of viral copies (<250 copies / mL). A negative result must be combined with clinical observations, patient history, and epidemiological information.  Fact Sheet for Patients:   StrictlyIdeas.no  Fact Sheet for Healthcare Providers: BankingDealers.co.za  This test is not yet approved or  cleared by the [place] FDA and has been authorized for detection and/or diagnosis of SARS-CoV-2 by FDA under an Emergency Use Authorization (EUA).  This EUA will remain in effect (meaning this test can be used)  for the duration of the COVID-19 declaration under Section 564(b)(1) of the Act, 21 U.S.C. section 360bbb-3(b)(1), unless the authorization is terminated or revoked sooner.  Performed at [facility], [place] [address], [place],  [address]          Radiology Studies: No results found.      Scheduled Meds: . insulin aspart  0-9 Units Subcutaneous Q4H  . lactulose  30 g Oral TID  . metoprolol tartrate  50 mg Oral BID  . montelukast  10 mg Oral QHS  . pantoprazole  40 mg Intravenous Q12H  . sodium chloride flush  3 mL Intravenous Q12H  . spironolactone  25 mg Oral BID   Continuous Infusions: . cefTRIAXone (ROCEPHIN)  IV 2 g (12/17/19 2131)     LOS: 4 days    Time spent: 34 minutes spent on chart review, discussion with nursing staff, consultants, updating family and interview/physical exam; more than 50% of that time was spent in counseling and/or coordination of care.    Keyanni Whittinghill J [place] DO [facility] Available via Epic secure chat 7am-7pm After these hours, please refer to coverage provider listed on [email or web address] 12/18/2019, 1:15 PM

## 2019-12-18 NOTE — Plan of Care (Signed)
°  Problem: Education: Goal: Knowledge of General Education information will improve Description: Including pain rating scale, medication(s)/side effects and non-pharmacologic comfort measures Outcome: Progressing   Problem: Education: Goal: Knowledge of General Education information will improve Description: Including pain rating scale, medication(s)/side effects and non-pharmacologic comfort measures Outcome: Progressing   Problem: Education: Goal: Knowledge of General Education information will improve Description: Including pain rating scale, medication(s)/side effects and non-pharmacologic comfort measures Outcome: Progressing   Problem: Education: Goal: Knowledge of General Education information will improve Description: Including pain rating scale, medication(s)/side effects and non-pharmacologic comfort measures Outcome: Progressing

## 2019-12-18 NOTE — Plan of Care (Signed)

## 2019-12-19 DIAGNOSIS — K269 Duodenal ulcer, unspecified as acute or chronic, without hemorrhage or perforation: Secondary | ICD-10-CM

## 2019-12-19 LAB — CBC
HCT: 39.4 % (ref 39.0–52.0)
Hemoglobin: 13.7 g/dL (ref 13.0–17.0)
MCH: 34.1 pg — ABNORMAL HIGH (ref 26.0–34.0)
MCHC: 34.8 g/dL (ref 30.0–36.0)
MCV: 98 fL (ref 80.0–100.0)
Platelets: 69 10*3/uL — ABNORMAL LOW (ref 150–400)
RBC: 4.02 MIL/uL — ABNORMAL LOW (ref 4.22–5.81)
RDW: 16.4 % — ABNORMAL HIGH (ref 11.5–15.5)
WBC: 4.2 10*3/uL (ref 4.0–10.5)
nRBC: 0 % (ref 0.0–0.2)

## 2019-12-19 LAB — COMPREHENSIVE METABOLIC PANEL
ALT: 32 U/L (ref 0–44)
AST: 95 U/L — ABNORMAL HIGH (ref 15–41)
Albumin: 2 g/dL — ABNORMAL LOW (ref 3.5–5.0)
Alkaline Phosphatase: 46 U/L (ref 38–126)
Anion gap: 8 (ref 5–15)
BUN: 9 mg/dL (ref 8–23)
CO2: 24 mmol/L (ref 22–32)
Calcium: 7.6 mg/dL — ABNORMAL LOW (ref 8.9–10.3)
Chloride: 101 mmol/L (ref 98–111)
Creatinine, Ser: 1.02 mg/dL (ref 0.61–1.24)
GFR calc Af Amer: >60 mL/min (ref >60)
GFR calc non Af Amer: >60 mL/min (ref >60)
Glucose, Bld: 87 mg/dL (ref 70–99)
Potassium: 3.5 mmol/L (ref 3.5–5.1)
Sodium: 133 mmol/L — ABNORMAL LOW (ref 135–145)
Total Bilirubin: 3.4 mg/dL — ABNORMAL HIGH (ref 0.3–1.2)
Total Protein: 5.5 g/dL — ABNORMAL LOW (ref 6.5–8.1)

## 2019-12-19 LAB — MAGNESIUM: Magnesium: 1.4 mg/dL — ABNORMAL LOW (ref 1.7–2.4)

## 2019-12-19 LAB — GLUCOSE, CAPILLARY
Glucose-Capillary: 126 mg/dL — ABNORMAL HIGH (ref 70–99)
Glucose-Capillary: 82 mg/dL (ref 70–99)
Glucose-Capillary: 84 mg/dL (ref 70–99)

## 2019-12-19 LAB — H. PYLORI ANTIBODY, IGG: H Pylori IgG: 1.37 Index Value — ABNORMAL HIGH (ref 0.00–0.79)

## 2019-12-19 MED ORDER — PANTOPRAZOLE SODIUM 40 MG PO TBEC: 40.0000 mg | DELAYED_RELEASE_TABLET | Freq: Two times a day (BID) | ORAL | 0 refills | Status: DC

## 2019-12-19 MED ORDER — LACTULOSE 10 GM/15ML PO SOLN: 20.0000 g | Freq: Three times a day (TID) | ORAL | 0 refills | Status: DC | PRN

## 2019-12-19 MED ORDER — PANTOPRAZOLE SODIUM 40 MG PO TBEC
40.0000 mg | DELAYED_RELEASE_TABLET | Freq: Two times a day (BID) | ORAL | Status: DC
  Administered 2019-12-19: 40 mg via ORAL
  Filled 2019-12-19: qty 1

## 2019-12-19 MED ORDER — CEPHALEXIN 500 MG PO CAPS: 500.0000 mg | ORAL_CAPSULE | Freq: Two times a day (BID) | ORAL | 0 refills | Status: AC

## 2019-12-19 NOTE — Progress Notes (Signed)
Occupational Therapy Treatment and Discharge Patient Details Name: Randall Frazier MRN: [id number] DOB: May 28, 1955 Today's Date: 12/19/2019    History of present illness Pt is a 64 y.o. male admitted 12/14/19 with confusion and tarry stools. Workup for upper GIB in setting of liver failure due to cirrhosis of unknown etiology; RLE cellulitis. Other PMH includes CAD, DM2, brain mass benign.   OT comments  Pt is functioning independently. Eager to go home.   Follow Up Recommendations  No OT follow up;Supervision - Intermittent    Equipment Recommendations  None recommended by OT    Recommendations for Other Services      Precautions / Restrictions         Mobility Bed Mobility               General bed mobility comments: sitting at EOB  Transfers Overall transfer level: Independent Equipment used: None                  Balance Overall balance assessment: No apparent balance deficits (not formally assessed)   Sitting balance-Leahy Scale: Normal       Standing balance-Leahy Scale: Normal                             ADL either performed or assessed with clinical judgement   ADL Overall ADL's : Independent                                       General ADL Comments: Crosses foot over opposite knee to reach feet.     Vision       Perception     Praxis      Cognition Arousal/Alertness: Awake/alert Behavior During Therapy: WFL for tasks assessed/performed Overall Cognitive Status: Within Functional Limits for tasks assessed                                          Exercises     Shoulder Instructions       General Comments      Pertinent Vitals/ Pain       Pain Assessment: No/denies pain  Home Living                                          Prior Functioning/Environment              Frequency           Progress Toward Goals  OT Goals(current goals can now be  found in the care plan section)  Progress towards OT goals: Goals met/education completed, patient discharged from OT  Acute Rehab OT Goals Patient Stated Goal: to go home   Plan All goals met and education completed, patient discharged from OT services    Co-evaluation                 AM-PAC OT "6 Clicks" Daily Activity     Outcome Measure   Help from another person eating meals?: None Help from another person taking care of personal grooming?: None Help from another person toileting, which includes using toliet, bedpan, or urinal?: None Help from another person bathing (including washing,  rinsing, drying)?: None Help from another person to put on and taking off regular upper body clothing?: None Help from another person to put on and taking off regular lower body clothing?: None 6 Click Score: 24    End of Session    OT Visit Diagnosis: Muscle weakness (generalized) (M62.81)   Activity Tolerance Patient tolerated treatment well   Patient Left in bed   Nurse Communication          Time: 9290-9030 OT Time Calculation (min): 16 min  Charges: OT General Charges $OT Visit: 1 Visit OT Treatments $Self Care/Home Management : 8-22 mins  Nestor Lewandowsky, OTR/L Acute Rehabilitation Services Pager: [phone] Office: [phone]   Malka So 12/19/2019, 11:04 AM

## 2019-12-19 NOTE — Discharge Summary (Signed)
Physician Discharge Summary  Randall Frazier GYB:[id number] DOB: [birth year] DOA: 12/14/2019  PCP: Gaynelle Arabian, MD  Admit date: 12/14/2019 Discharge date: 12/19/2019  Admitted From: Home Disposition: Home  Recommendations for Outpatient Follow-up:  1. Follow up with PCP in 1-2 weeks 2. Continue Protonix 40 mg p.o. twice daily 3. Continue spironolactone 25 mg p.o. twice daily 4. Outpatient follow-up with gastroenterology 5. Please obtain CMP/CBC in one week 6. 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  Patient also noted skin discoloration to yellow with yellow sclera and increased edema to lower extremities.  Patient recently establish care with gastroenterology at the [facility] and was recently started on the spironolactone.  He reports previous liver biopsy in the [facility], roughly 1994 and does not report any significant findings.  In the ED, sodium 129, potassium 4.2, BUN 19, creatinine 1.24, AST 152, ALT 34, ammonia level 54.  Total bilirubin 6.3.  WBC 11.6, hemoglobin 16.0, platelets 59.  INR 2.0.  HEENT negative.  Chest x-ray with streaky atelectasis left base.  CT head without acute intracranial abnormality.  Case was discussed by ED physician with GI, Dr. Watt Climes about concern for upper GI bleed versus decompensated cirrhosis; recommended hospitalist admission with a GI to follow in the a.m.   Hospital course:  Upper GI bleed 2/2 esophageal varices, gastritis, gastric/duodenal ulcers Patient presenting  with 3-day history of reported dark tarry stools.  Hemoglobin 14.9 on admission with normal BUN.  Denies NSAID abuse, reports regular Tylenol use and occasional EtOH use.  Seems to have been recently diagnosed with cirrhosis.  FOBT negative.  Considerations are for gastric ulcer/gastritis versus esophageal varices in the setting of cirrhosis. EGD on 12/16/2019 notable for grade 1 varices distal esophagus, moderate portal hypertensive gastropathy, bleeding gastric ulcer, several nonbleeding duodenal ulcer. This continued home aspirin due to thrombocytopenia. Completed 3-day course of Protonix drip, IV proximal drip transition to Protonix 40 g p.o. twice daily in which she will continue outpatient. Outpatient follow-up with gastroenterology, Dr. Michail Sermon. Hemoglobin remained stable during the remainder of the hospitalization.  Decompensated cirrhosis Hx NAFLD Recently established care with gastroenterology at the [facility] and started on spironolactone 25 mg p.o. twice daily.  Right upper quadrant ultrasound notes liver slightly echogenic with contour nodularity and portal vein patent on color Doppler imaging.  Acute hepatitis panel negative.  Received IV albumin on admission. MELD-NA on admission = 27; down to 21. MDF = 32.2; down to 21.7. Continue spironolactone 25 mg p.o. twice daily. Outpatient follow-up with gastroenterology. Instructed on avoidance of alcohol and dietary restrictions at time of discharge.  Acute renal failure: Resolved Patient presenting with elevated creatinine of 1.24 with this baseline 0.90 April 2016.  Etiology likely secondary to hepatorenal syndrome versus dehydration in the setting of recent spironolactone initiation. Creatinine trended up to a high of 1.25 and trended down to 1.02 at time of discharge.  Acute hepatic encephalopathy Patient presenting with 3-day progressive confusion.  CT head with no acute intracranial abnormality.  Ammonia level elevated at 54 on admission.  EtOH  level less than 5. Mentation returned  back to his normal baseline. Continue lactulose 20g PO TID, titrate for 2-3 soft BMs daily.   Right lower extremity cellulitis Patient with right lower extremity erythema.  WBC count 9.1. Treated with IV ceftriaxone during hospitalization and transition to Keflex to complete antibiotic course outpatient.  Thrombocytopenia Platelets 58 on admission, likely function of poor synthetic function due to underlying cirrhosis.  Received vitamin K 5 mg p.o. and vitamin K 10 mg IV and 2 unit platelets on admission. Continue home aspirin.  Hyponatremia Sodium 129 on admission with urine osmolality 717, urine sodium 32, urine creatinine 183. Sodium 133 at time of discharge.  Essential hypertension Continue metoprolol tartrate 50 mg p.o. twice daily  T2DM Hemoglobin A1c 6.0. Continue home Metformin 1000 mg p.o. twice daily at home.  CAD s/p CABG Previously on atorvastatin which has been discontinued due to cirrhosis.  TTE with LVEF 55-60%, moderate LVH, grade 2 diastolic dysfunction, LA mildly dilated, mild MR. Discontinued home aspirin in the setting of thrombocytopenia and gastric/duodenal ulcers  Hx Gout: On allopurinol outpatient.  Left sphenoid wing meningioma Follows with [facility] neurosurgery.  Status post gamma knife surgery on 08/03/2018.  Seen in 53-monthfollow-up last on 02/16/2019.  Follow-up MRI notable for no growth and no concerning changes and recommends repeat follow-up 1 year with repeat MRI.  Discharge Diagnoses:  Active Problems:   Melena   DM2 (diabetes mellitus, type 2) (HCC)   Decompensated hepatic cirrhosis (HCC)   CAD (coronary artery disease)   Hyperlipemia   Essential hypertension   Dyspnea   Thrombocytopenia (HCC)   AKI (acute kidney injury) (HCC)   Hypoalbuminemia   Acute hepatic encephalopathy   Hyponatremia   Cellulitis of right leg   Portal hypertension with esophageal varices (HCC)   Duodenal ulcer    Gastric ulcer   Gastritis    Discharge Instructions  Discharge Instructions    Call MD for:  difficulty breathing, headache or visual disturbances   Complete by: As directed    Call MD for:  extreme fatigue   Complete by: As directed    Call MD for:  persistant dizziness or light-headedness   Complete by: As directed    Call MD for:  persistant nausea and vomiting   Complete by: As directed    Call MD for:  severe uncontrolled pain   Complete by: As directed    Call MD for:  temperature >100.4   Complete by: As directed    Diet - low sodium heart healthy   Complete by: As directed    Increase activity slowly   Complete by: As directed      Allergies as of 12/19/2019      Reactions   Pork-derived Products Other (See Comments)   Cannot eat- gout   Statins Other (See Comments)   Cramps   Trazodone And Nefazodone Nausea Only, Other (See Comments)   Caused patient to have a lowered level of tolerance, too   Lisinopril Rash   Niacin Rash   Niacin And Related Rash      Medication List    STOP taking these medications   acetaminophen 500 MG tablet Commonly known as: TYLENOL   aspirin EC 81 MG tablet   atorvastatin 20 MG tablet Commonly known as: LIPITOR   cyclobenzaprine 10 MG tablet Commonly known as: FLEXERIL   methocarbamol 500 MG tablet Commonly known as: Robaxin   oxyCODONE-acetaminophen 5-325 MG tablet Commonly known as: Roxicet   traZODone 150 MG tablet Commonly known as:  DESYREL     TAKE these medications   allopurinol 100 MG tablet Commonly known as: ZYLOPRIM Take 100 mg by mouth as needed (AS DIRECTED, FOR GOUT FLARES).   cephALEXin 500 MG capsule Commonly known as: KEFLEX Take 1 capsule (500 mg total) by mouth 2 (two) times daily for 6 days.   diphenhydrAMINE 25 mg capsule Commonly known as: BENADRYL Take 25 mg by mouth at bedtime.   fish oil-omega-3 fatty acids 1000 MG capsule Take 1,000 mg by mouth daily.   lactulose 10 GM/15ML  solution Commonly known as: CHRONULAC Take 30 mLs (20 g total) by mouth 3 (three) times daily as needed for mild constipation. Ensure 2-3 soft BMs daily   loratadine 10 MG tablet Commonly known as: CLARITIN Take 10 mg by mouth daily as needed for allergies or rhinitis.   Lubricating Plus Eye Drops 0.5 % Soln Generic drug: Carboxymethylcellulose Sod PF Place 1 drop into both eyes 3 (three) times daily as needed (for irritation).   meloxicam 15 MG tablet Commonly known as: MOBIC Take 15 mg by mouth at bedtime as needed for pain.   metFORMIN 1000 MG tablet Commonly known as: GLUCOPHAGE Take 1,000 mg by mouth 2 (two) times daily with a meal. What changed: Another medication with the same name was removed. Continue taking this medication, and follow the directions you see here.   metoprolol tartrate 50 MG tablet Commonly known as: LOPRESSOR Take 50 mg by mouth 2 (two) times daily.   montelukast 10 MG tablet Commonly known as: SINGULAIR Take 10 mg by mouth at bedtime.   pantoprazole 40 MG tablet Commonly known as: PROTONIX Take 1 tablet (40 mg total) by mouth 2 (two) times daily.   SM Double Antibiotic 500-10000 UNIT/GM Oint Apply 1 application topically See admin instructions. Apply to affected area of right leg 2 times a day   spironolactone 25 MG tablet Commonly known as: ALDACTONE Take 25 mg by mouth 2 (two) times daily.   testosterone cypionate 200 MG/ML injection Commonly known as: DEPOTESTOSTERONE CYPIONATE Inject into the muscle every 14 (fourteen) days.   THERAPEUTIC SHAMPOO 0.5 % shampoo Generic drug: coal tar Apply 1 application topically See admin instructions. Shampoo at least 4 times a week   Vitamin D3 50 MCG (2000 UT) Tabs Take 2,000 Units by mouth daily.   vitamin E 180 MG (400 UNITS) capsule Generic drug: vitamin E Take 400 Units by mouth daily.       Follow-up Information    Gaynelle Arabian, MD. Schedule an appointment as soon as possible for a  visit in 1 week(s).   Specialty: Family Medicine Contact information: [address], [place] [address] [phone]        Wilford Corner, MD. Schedule an appointment as soon as possible for a visit in 3 week(s).   Specialty: Gastroenterology Contact information: [address] [place] [address] [phone]              Allergies  Allergen Reactions  . Pork-Derived Products Other (See Comments)    Cannot eat- gout  . Statins Other (See Comments)    Cramps   . Trazodone And Nefazodone Nausea Only and Other (See Comments)    Caused patient to have a lowered level of tolerance, too  . Lisinopril Rash  . Niacin Rash  . Niacin And Related Rash    Consultations:  [facility]   Procedures/Studies: CT Head Wo Contrast  Result Date: 12/14/2019 CLINICAL DATA:  Mental status change EXAM: CT HEAD WITHOUT CONTRAST TECHNIQUE: Contiguous axial images were obtained from the base of the skull through the vertex without intravenous contrast. COMPARISON:  CT brain 09/03/2014 FINDINGS: Brain: No acute territorial infarction, hemorrhage or intracranial mass. The ventricles are nonenlarged. Vascular: No hyperdense vessels. Scattered carotid vascular calcification Skull: Normal. Negative for fracture or focal lesion. Sinuses/Orbits: No acute finding. Other: None IMPRESSION: Negative non contrasted CT appearance of the brain. Electronically Signed   By: Donavan Foil M.D.   On: 12/14/2019 23:23   US Abdomen Limited  Result Date: 12/14/2019 CLINICAL DATA:  Abdomen pain EXAM: ULTRASOUND ABDOMEN LIMITED RIGHT UPPER QUADRANT COMPARISON:  Ultrasound 09/03/2012 FINDINGS: Gallbladder: Small amount of gallbladder sludge. Shadowing stone measuring up to 1.6 cm. Normal wall thickness. Negative sonographic Murphy. Common bile duct: Diameter: 6 mm Liver: Liver is slightly echogenic. Contour nodularity of the liver. Portal vein is patent on color Doppler  imaging with normal direction of blood flow towards the liver. Other: None. IMPRESSION: 1. Sludge and gallstone without sonographic evidence for acute cholecystitis 2. Suspected liver cirrhosis Electronically Signed   By: Donavan Foil M.D.   On: 12/14/2019 21:37   DG CHEST PORT 1 VIEW  Result Date: 12/15/2019 CLINICAL DATA:  GI bleeding EXAM: PORTABLE CHEST 1 VIEW COMPARISON:  04/19/2015 FINDINGS: Hardware in the cervical spine. Post sternotomy changes. Streaky atelectasis left base. No consolidation or pleural effusion. Borderline cardiac size. No pneumothorax. Stable calcified lung nodule in the right upper lobe. IMPRESSION: Streaky atelectasis left base. Electronically Signed   By: Donavan Foil M.D.   On: 12/15/2019 00:34   ECHOCARDIOGRAM COMPLETE  Result Date: 12/15/2019    ECHOCARDIOGRAM REPORT   Patient Name:   Randall Frazier Date of Exam: 12/15/2019 Medical Rec #:  [id number]      Height:       71.0 in Accession #:    [id number]     Weight:       284.4 lb Date of Birth:  02-Nov-1955      BSA:          2.449 m Patient Age:    77 years       BP:           134/73 mmHg Patient Gender: M              HR:           59 bpm. Exam Location:  Inpatient Procedure: 2D Echo Indications:    786.09 dyspnea  History:        Patient has no prior history of Echocardiogram examinations.                 Prior CABG; Risk Factors:Diabetes and Hypertension.  Sonographer:    Jannett Celestine RDCS (AE) Referring Phys: [place]  Sonographer Comments: Image acquisition challenging due to patient body habitus. IMPRESSIONS  1. Left ventricular ejection fraction, by estimation, is 55 to 60%. The left ventricle has normal function. The left ventricle has no regional wall motion abnormalities. There is moderate left ventricular hypertrophy of the basal-septal segment. Left ventricular diastolic parameters are consistent with Grade II diastolic dysfunction (pseudonormalization). Elevated left atrial pressure.  2. Right  ventricular systolic function is normal. The right ventricular size is normal.  3. Left atrial size was mildly dilated.  4. The mitral valve is normal in structure. Mild mitral valve regurgitation. No evidence of mitral stenosis.  5. The aortic valve is tricuspid. Aortic valve regurgitation is not  visualized. No aortic stenosis is present. FINDINGS  Left Ventricle: Left ventricular ejection fraction, by estimation, is 55 to 60%. The left ventricle has normal function. The left ventricle has no regional wall motion abnormalities. The left ventricular internal cavity size was normal in size. There is  moderate left ventricular hypertrophy of the basal-septal segment. Left ventricular diastolic parameters are consistent with Grade II diastolic dysfunction (pseudonormalization). Elevated left atrial pressure. Right Ventricle: The right ventricular size is normal.Right ventricular systolic function is normal. Left Atrium: Left atrial size was mildly dilated. Right Atrium: Right atrial size was normal in size. Pericardium: There is no evidence of pericardial effusion. Mitral Valve: The mitral valve is normal in structure. Normal mobility of the mitral valve leaflets. Mild mitral annular calcification. Mild mitral valve regurgitation. No evidence of mitral valve stenosis. Tricuspid Valve: The tricuspid valve is normal in structure. Tricuspid valve regurgitation is trivial. No evidence of tricuspid stenosis. Aortic Valve: The aortic valve is tricuspid. Aortic valve regurgitation is not visualized. No aortic stenosis is present. Pulmonic Valve: The pulmonic valve was not well visualized. Pulmonic valve regurgitation is not visualized. No evidence of pulmonic stenosis. Aorta: The aortic root is normal in size and structure. Venous: The inferior vena cava was not well visualized.  LEFT VENTRICLE PLAX 2D LVIDd:         4.80 cm  Diastology LVIDs:         3.20 cm  LV e' lateral:   12.00 cm/s LV PW:         1.10 cm  LV E/e'  lateral: 8.2 LV IVS:        0.80 cm  LV e' medial:    5.98 cm/s LVOT diam:     2.30 cm  LV E/e' medial:  16.5 LV SV:         106 LV SV Index:   43 LVOT Area:     4.15 cm  RIGHT VENTRICLE RV S prime:     7.72 cm/s TAPSE (M-mode): 1.2 cm LEFT ATRIUM             Index       RIGHT ATRIUM           Index LA diam:        4.10 cm 1.67 cm/m  RA Area:     26.00 cm LA Vol (A2C):   70.9 ml 28.93 ml/m RA Volume:   78.80 ml  32.18 ml/m LA Vol (A4C):   97.8 ml 39.94 ml/m LA Biplane Vol: 85.0 ml 34.71 ml/m  AORTIC VALVE LVOT Vmax:   104.00 cm/s LVOT Vmean:  74.700 cm/s LVOT VTI:    0.256 m  AORTA Ao Root diam: 3.00 cm MITRAL VALVE MV Area (PHT): 2.83 cm    SHUNTS MV Decel Time: 268 msec    Systemic VTI:  0.26 m MV E velocity: 98.50 cm/s  Systemic Diam: 2.30 cm MV A velocity: 83.10 cm/s MV E/A ratio:  1.19 Kirk Ruths MD Electronically signed by Kirk Ruths MD Signature Date/Time: 12/15/2019/2:12:53 PM    Final       Subjective: Patient seen and examined bedside, resting comfortably. Eating breakfast. No complaints this morning. Ready for discharge home. Denies headache, no visual changes, no chest pain, no palpitations, no shortness of breath, no abdominal pain, no weakness, fatigue, paresthesias. No acute events overnight per nursing staff.  Discharge Exam: Vitals:   12/19/19 0402 12/19/19 0825  BP: 129/63 (!) 153/97  Pulse: (!) 55 (!) 55  Resp:  15 18  Temp: 98.7 F (37.1 C) (!) 97.4 F (36.3 C)  SpO2: 90% 91%   Vitals:   12/18/19 1620 12/18/19 2039 12/19/19 0402 12/19/19 0825  BP: (!) 116/56 135/63 129/63 (!) 153/97  Pulse: (!) 57 (!) 57 (!) 55 (!) 55  Resp: 18  15 18   Temp: 97.6 F (36.4 C) 98.9 F (37.2 C) 98.7 F (37.1 C) (!) 97.4 F (36.3 C)  TempSrc: Oral Oral Oral Oral  SpO2: 90% 96% 90% 91%  Weight:      Height:        General: Pt is alert, awake, not in acute distress Cardiovascular: RRR, S1/S2 +, no rubs, no gallops Respiratory: CTA bilaterally, no wheezing, no  rhonchi Abdominal: Soft, NT, ND, bowel sounds + Extremities: no edema, no cyanosis Skin: Mild jaundice, bilateral 1+ pitting edema bilateral lower extremity, right lower extremity with petechia/erythema, ecchymosis and bruising    The results of significant diagnostics from this hospitalization (including imaging, microbiology, ancillary and laboratory) are listed below for reference.     Microbiology: Recent Results (from the past 240 hour(s))  SARS Coronavirus 2 by RT PCR (hospital order, performed in [facility] hospital lab) Nasopharyngeal Nasopharyngeal Swab     Status: None   Collection Time: 12/14/19  9:55 PM   Specimen: Nasopharyngeal Swab  Result Value Ref Range Status   SARS Coronavirus 2 NEGATIVE NEGATIVE Final    Comment: (NOTE) SARS-CoV-2 target nucleic acids are NOT DETECTED.  The SARS-CoV-2 RNA is generally detectable in upper and lower respiratory specimens during the acute phase of infection. The lowest concentration of SARS-CoV-2 viral copies this assay can detect is 250 copies / mL. A negative result does not preclude SARS-CoV-2 infection and should not be used as the sole basis for treatment or other patient management decisions.  A negative result may occur with improper specimen collection / handling, submission of specimen other than nasopharyngeal swab, presence of viral mutation(s) within the areas targeted by this assay, and inadequate number of viral copies (<250 copies / mL). A negative result must be combined with clinical observations, patient history, and epidemiological information.  Fact Sheet for Patients:   StrictlyIdeas.no  Fact Sheet for Healthcare Providers: BankingDealers.co.za  This test is not yet approved or  cleared by the [place] FDA and has been authorized for detection and/or diagnosis of SARS-CoV-2 by FDA under an Emergency Use Authorization (EUA).  This EUA will remain in  effect (meaning this test can be used) for the duration of the COVID-19 declaration under Section 564(b)(1) of the Act, 21 U.S.C. section 360bbb-3(b)(1), unless the authorization is terminated or revoked sooner.  Performed at [facility], [place] [address], [place], [place] [address]      Labs: BNP (last 3 results) Recent Labs    12/15/19 0602  BNP 294.7*   Basic Metabolic Panel: Recent Labs  Lab 12/15/19 0602 12/16/19 0825 12/17/19 0445 12/18/19 0244 12/19/19 0309  NA 131* 136 134* 134* 133*  K 3.9 4.1 4.8 3.6 3.5  CL 98 104 103 104 101  CO2 21* 25 20* 22 24  GLUCOSE 89 91 105* 103* 87  BUN 19 21 15 11 9   CREATININE 1.17 1.25* 1.01 0.98 1.02  CALCIUM 8.0* 7.5* 7.1* 7.2* 7.6*  MG 1.5*  1.5* 1.5* 1.6* 1.7 1.4*  PHOS 3.0  2.9  --   --   --   --    Liver Function Tests: Recent Labs  Lab 12/15/19 0602 12/16/19 0825  12/17/19 0445 12/18/19 0244 12/19/19 0309  AST 137* 107* 131* 101* 95*  ALT 33 28 24 31  32  ALKPHOS 59 44 42 49 46  BILITOT 5.5* 3.8* 3.8* 3.5* 3.4*  PROT 7.0 5.8* 5.4* 5.8* 5.5*  ALBUMIN 2.5* 2.2* 2.0* 2.1* 2.0*   No results for input(s): LIPASE, AMYLASE in the last 168 hours. Recent Labs  Lab 12/14/19 1625 12/15/19 0602  AMMONIA 54* 49*   CBC: Recent Labs  Lab 12/15/19 0602 12/15/19 0602 12/16/19 0105 12/16/19 0825 12/17/19 0445 12/18/19 0244 12/19/19 0309  WBC 9.1   < > 4.3 4.0 3.7* 4.2 4.2  NEUTROABS 6.7  --  2.6  --   --   --   --   HGB 14.9   < > 13.2 13.5 13.7 13.5 13.7  HCT 42.9   < > 38.4* 38.5* 39.8 39.2 39.4  MCV 96.0   < > 97.0 98.0 97.8 97.0 98.0  PLT 58*   < > 52* 51* 40* 65* 69*   < > = values in this interval not displayed.   Cardiac Enzymes: No results for input(s): CKTOTAL, CKMB, CKMBINDEX, TROPONINI in the last 168 hours. BNP: Invalid input(s): POCBNP CBG: Recent Labs  Lab 12/18/19 1617 12/18/19 2039 12/19/19 0002 12/19/19 0404 12/19/19 0822  GLUCAP 113* 156* 126* 82 84   D-Dimer No results for  input(s): DDIMER in the last 72 hours. Hgb A1c No results for input(s): HGBA1C in the last 72 hours. Lipid Profile No results for input(s): CHOL, HDL, LDLCALC, TRIG, CHOLHDL, LDLDIRECT in the last 72 hours. Thyroid function studies No results for input(s): TSH, T4TOTAL, T3FREE, THYROIDAB in the last 72 hours.  Invalid input(s): FREET3 Anemia work up No results for input(s): VITAMINB12, FOLATE, FERRITIN, TIBC, IRON, RETICCTPCT in the last 72 hours. Urinalysis    Component Value Date/Time   COLORURINE AMBER (A) 12/14/2019 2200   APPEARANCEUR HAZY (A) 12/14/2019 2200   LABSPEC 1.020 12/14/2019 2200   PHURINE 5.0 12/14/2019 2200   GLUCOSEU NEGATIVE 12/14/2019 2200   HGBUR LARGE (A) 12/14/2019 2200   BILIRUBINUR NEGATIVE 12/14/2019 2200   KETONESUR NEGATIVE 12/14/2019 2200   PROTEINUR 30 (A) 12/14/2019 2200   UROBILINOGEN 1.0 12/16/2012 1114   NITRITE NEGATIVE 12/14/2019 2200   LEUKOCYTESUR NEGATIVE 12/14/2019 2200   Sepsis Labs Invalid input(s): PROCALCITONIN,  WBC,  LACTICIDVEN Microbiology Recent Results (from the past 240 hour(s))  SARS Coronavirus 2 by RT PCR (hospital order, performed in [place] hospital lab) Nasopharyngeal Nasopharyngeal Swab     Status: None   Collection Time: 12/14/19  9:55 PM   Specimen: Nasopharyngeal Swab  Result Value Ref Range Status   SARS Coronavirus 2 NEGATIVE NEGATIVE Final    Comment: (NOTE) SARS-CoV-2 target nucleic acids are NOT DETECTED.  The SARS-CoV-2 RNA is generally detectable in upper and lower respiratory specimens during the acute phase of infection. The lowest concentration of SARS-CoV-2 viral copies this assay can detect is 250 copies / mL. A negative result does not preclude SARS-CoV-2 infection and should not be used as the sole basis for treatment or other patient management decisions.  A negative result may occur with improper specimen collection / handling, submission of specimen other than nasopharyngeal swab, presence  of viral mutation(s) within the areas targeted by this assay, and inadequate number of viral copies (<250 copies / mL). A negative result must be combined with clinical observations, patient history, and epidemiological information.  Fact Sheet for Patients:   StrictlyIdeas.no  Fact Sheet for Healthcare  Providers: BankingDealers.co.za  This test is not yet approved or  cleared by the [place] and has been authorized for detection and/or diagnosis of SARS-CoV-2 by FDA under an Emergency Use Authorization (EUA).  This EUA will remain in effect (meaning this test can be used) for the duration of the COVID-19 declaration under Section 564(b)(1) of the Act, 21 U.S.C. section 360bbb-3(b)(1), unless the authorization is terminated or revoked sooner.  Performed at [facility], [place] [address], [place], [place] [address]      Time coordinating discharge: Over 30 minutes  SIGNED:   Faron Whitelock J [place] DO  [facility] 12/19/2019, 2:03 PM

## 2019-12-19 NOTE — Discharge Instructions (Signed)
Cirrhosis  Cirrhosis is long-term (chronic) liver injury. The liver is the body's largest internal organ, and it performs many functions. It converts food into energy, removes toxic material from the blood, makes important proteins, and absorbs necessary vitamins from food. In cirrhosis, healthy liver cells are replaced by scar tissue. This prevents blood from flowing through the liver, making it difficult for the liver to function. Scarring of the liver cannot be reversed, but treatment can prevent it from getting worse. What are the causes? Common causes of this condition are hepatitis C and long-term alcohol abuse. Other causes include:  Nonalcoholic fatty liver disease. This happens when fat is deposited in the liver by causes other than alcohol.  Hepatitis B infection.  Autoimmune hepatitis. In this condition, the body's defense system (immune system) mistakenly attacks the liver cells, causing irritation and swelling (inflammation).  Diseases that cause blockage of ducts inside the liver.  Inherited liver diseases, such as hemochromatosis. This is one of the most common inherited liver diseases. In this disease, deposits of iron collect in the liver and other organs.  Reactions to certain long-term medicines, such as amiodarone, a heart medicine.  Parasitic infections. These include schistosomiasis, which is caused by a flatworm.  Long-term contact to certain toxins. These toxins include certain organic solvents, such as toluene and chloroform. What increases the risk? You are more likely to develop this condition if:  You have certain types of viral hepatitis.  You abuse alcohol, especially if you are male.  You are overweight.  You share needles.  You have unprotected sex with someone who has viral hepatitis. What are the signs or symptoms? You may not have any signs and symptoms at first. Symptoms may not develop until the damage to your liver starts to get worse. Early  symptoms may include:  Weakness and tiredness (fatigue).  Changes in sleep patterns or having trouble sleeping.  Itchiness.  Tenderness in the right-upper part of your abdomen.  Weight loss and muscle loss.  Nausea.  Loss of appetite.  Appearance of tiny blood vessels under the skin. Later symptoms may include:  Fatigue or weakness that is getting worse.  Yellow skin and eyes (jaundice).  Buildup of fluid in the abdomen (ascites). You may notice that your clothes are tight around your waist.  Weight gain.  Swelling of the feet and ankles (edema).  Trouble breathing.  Easy bruising and bleeding.  Vomiting blood.  Black or bloody stool.  Mental confusion. How is this diagnosed? Your health care provider may suspect cirrhosis based on your symptoms and medical history, especially if you have other medical conditions or a history of alcohol abuse. Your health care provider will do a physical exam to feel your liver and to check for signs of cirrhosis. He or she may perform other tests, including:  Blood tests to check: ? For hepatitis B or C. ? Kidney function. ? Liver function.  Imaging tests such as: ? MRI or CT scan to look for changes seen in advanced cirrhosis. ? Ultrasound to see if normal liver tissue is being replaced by scar tissue.  A procedure in which a long needle is used to take a sample of liver tissue to be checked in a lab (biopsy). Liver biopsy can confirm the diagnosis of cirrhosis. How is this treated? Treatment for this condition depends on how damaged your liver is and what caused the damage. It may include treating the symptoms of cirrhosis, or treating the underlying causes in order to  slow the damage. Treatment may include:  Making lifestyle changes, such as: ? Eating a healthy diet. You may need to work with your health care provider or a diet and nutrition specialist (dietitian) to develop an eating plan. ? Restricting salt  intake. ? Maintaining a healthy weight. ? Not abusing drugs or alcohol.  Taking medicines to: ? Treat liver infections or other infections. ? Control itching. ? Reduce fluid buildup. ? Reduce certain blood toxins. ? Reduce risk of bleeding from enlarged blood vessels in the stomach or esophagus (varices).  Liver transplant. In this procedure, a liver from a donor is used to replace your diseased liver. This is done if cirrhosis has caused liver failure. Other treatments and procedures may be done depending on the problems that you get from cirrhosis. Common problems include liver-related kidney failure (hepatorenal syndrome). Follow these instructions at home:   Take medicines only as told by your health care provider. Do not use medicines that are toxic to your liver. Ask your health care provider before taking any new medicines, including over-the-counter medicines.  Rest as needed.  Eat a well-balanced diet. Ask your health care provider or dietitian for more information.  Limit your salt or water intake, if your health care provider asks you to do this.  Do not drink alcohol. This is especially important if you are taking acetaminophen.  Keep all follow-up visits as told by your health care provider. This is important. Contact a health care provider if you:  Have fatigue or weakness that is getting worse.  Develop swelling of the hands, feet, legs, or face.  Have a fever.  Develop loss of appetite.  Have nausea or vomiting.  Develop jaundice.  Develop easy bruising or bleeding. Get help right away if you:  Vomit bright red blood or a material that looks like coffee grounds.  Have blood in your stools.  Notice that your stools appear black and tarry.  Become confused.  Have chest pain or trouble breathing. Summary  Cirrhosis is chronic liver injury. Liver damage cannot be reversed. Common causes are hepatitis C and long-term alcohol abuse.  Tests used to  diagnose cirrhosis include blood tests, imaging tests, and liver biopsy.  Treatment for this condition involves treating the underlying cause. Avoid alcohol, drugs, salt, and medicines that may damage your liver.  Contact your health care provider if you develop ascites, edema, jaundice, fever, nausea or vomiting, easy bruising or bleeding, or worsening fatigue. This information is not intended to replace advice given to you by your health care provider. Make sure you discuss any questions you have with your health care provider. Document Revised: 08/18/2018 Document Reviewed: 03/18/2017 Elsevier Patient Education  [place]. Fatty Liver Disease  Fatty liver disease occurs when too much fat has built up in your liver cells. Fatty liver disease is also called hepatic steatosis or steatohepatitis. The liver removes harmful substances from your bloodstream and produces fluids that your body needs. It also helps your body use and store energy from the food you eat. In many cases, fatty liver disease does not cause symptoms or problems. It is often diagnosed when tests are being done for other reasons. However, over time, fatty liver can cause inflammation that may lead to more serious liver problems, such as scarring of the liver (cirrhosis) and liver failure. Fatty liver is associated with insulin resistance, increased body fat, high blood pressure (hypertension), and high cholesterol. These are features of metabolic syndrome and increase your risk for  stroke, diabetes, and heart disease. What are the causes? This condition may be caused by:  Drinking too much alcohol.  Poor nutrition.  Obesity.  Cushing's syndrome.  Diabetes.  High cholesterol.  Certain drugs.  Poisons.  Some viral infections.  Pregnancy. What increases the risk? You are more likely to develop this condition if you:  Abuse alcohol.  Are overweight.  Have diabetes.  Have hepatitis.  Have a high  triglyceride level.  Are pregnant. What are the signs or symptoms? Fatty liver disease often does not cause symptoms. If symptoms do develop, they can include:  Fatigue.  Weakness.  Weight loss.  Confusion.  Abdominal pain.  Nausea and vomiting.  Yellowing of your skin and the white parts of your eyes (jaundice).  Itchy skin. How is this diagnosed? This condition may be diagnosed by:  A physical exam and medical history.  Blood tests.  Imaging tests, such as an ultrasound, CT scan, or MRI.  A liver biopsy. A small sample of liver tissue is removed using a needle. The sample is then looked at under a microscope. How is this treated? Fatty liver disease is often caused by other health conditions. Treatment for fatty liver may involve medicines and lifestyle changes to manage conditions such as:  Alcoholism.  High cholesterol.  Diabetes.  Being overweight or obese. Follow these instructions at home:   Do not drink alcohol. If you have trouble quitting, ask your health care provider how to safely quit with the help of medicine or a supervised program. This is important to keep your condition from getting worse.  Eat a healthy diet as told by your health care provider. Ask your health care provider about working with a diet and nutrition specialist (dietitian) to develop an eating plan.  Exercise regularly. This can help you lose weight and control your cholesterol and diabetes. Talk to your health care provider about an exercise plan and which activities are best for you.  Take over-the-counter and prescription medicines only as told by your health care provider.  Keep all follow-up visits as told by your health care provider. This is important. Contact a health care provider if: You have trouble controlling your:  Blood sugar. This is especially important if you have diabetes.  Cholesterol.  Drinking of alcohol. Get help right away if:  You have abdominal  pain.  You have jaundice.  You have nausea and vomiting.  You vomit blood or material that looks like coffee grounds.  You have stools that are black, tar-like, or bloody. Summary  Fatty liver disease develops when too much fat builds up in the cells of your liver.  Fatty liver disease often causes no symptoms or problems. However, over time, fatty liver can cause inflammation that may lead to more serious liver problems, such as scarring of the liver (cirrhosis).  You are more likely to develop this condition if you abuse alcohol, are pregnant, are overweight, have diabetes, have hepatitis, or have high triglyceride levels.  Contact your health care provider if you have trouble controlling your weight, blood sugar, cholesterol, or drinking of alcohol. This information is not intended to replace advice given to you by your health care provider. Make sure you discuss any questions you have with your health care provider. Document Revised: 04/10/2017 Document Reviewed: 02/04/2017 Elsevier Patient Education  2020 [place]. Nonalcoholic Fatty Liver Disease Diet, Adult Nonalcoholic fatty liver disease is a condition that causes fat to build up in and around the liver. The disease  makes it harder for the liver to work the way that it should. Following a healthy diet can help to keep nonalcoholic fatty liver disease under control. It can also help to prevent or improve conditions that are associated with the disease, such as heart disease, diabetes, high blood pressure, and abnormal cholesterol levels. Along with regular exercise, this diet:  Promotes weight loss.  Helps to control blood sugar levels.  Helps to improve the way that the body uses insulin. What are tips for following this plan? Reading food labels Always check food labels for:  The amount of saturated fat in a food. You should limit your intake of saturated fat. Saturated fat is found in foods that come from animals,  including meat and dairy products such as butter, cheese, and whole milk.  The amount of fiber in a food. You should choose high-fiber foods such as fruits, vegetables, and whole grains. Try to get 25-30 grams (g) of fiber a day.  Cooking  When cooking, use heart-healthy oils that are high in monounsaturated fats. These include olive oil, canola oil, and avocado oil.  Limit frying or deep-frying foods. Cook foods using healthy methods such as baking, boiling, steaming, and grilling instead. Meal planning  You may want to keep track of how many calories you take in. Eating the right amount of calories will help you achieve a healthy weight. Meeting with a registered dietitian can help you get started.  Limit how often you eat takeout and fast food. These foods are usually very high in fat, salt, and sugar.  Use the glycemic index (GI) to plan your meals. The index tells you how quickly a food will raise your blood sugar. Choose low-GI foods (GI less than 55). These foods take a longer time to raise blood sugar. A registered dietitian can help you identify foods lower on the GI scale. Lifestyle  You may want to follow a Mediterranean diet. This diet includes a lot of vegetables, lean meats or fish, whole grains, fruits, and healthy oils and fats. What foods can I eat?  Fruits Bananas. Apples. Oranges. Grapes. Papaya. Mango. Pomegranate. Kiwi. Grapefruit. Cherries. Vegetables Lettuce. Spinach. Peas. Beets. Cauliflower. Cabbage. Broccoli. Carrots. Tomatoes. Squash. Eggplant. Herbs. Peppers. Onions. Cucumbers. Brussels sprouts. Yams and sweet potatoes. Beans. Lentils. Grains Whole wheat or whole-grain foods, including breads, crackers, cereals, and pasta. Stone-ground whole wheat. Unsweetened oatmeal. Bulgur. Barley. Quinoa. Brown or wild rice. Corn or whole wheat flour tortillas. Meats and other proteins Lean meats. Poultry. Tofu. Seafood and shellfish. Dairy Low-fat or fat-free dairy  products, such as yogurt, cottage cheese, or cheese. Beverages Water. Sugar-free drinks. Tea. Coffee. Low-fat or skim milk. Milk alternatives, such as soy or almond milk. Real fruit juice. Fats and oils Avocado. Canola or olive oil. Nuts and nut butters. Seeds. Seasonings and condiments Mustard. Relish. Low-fat, low-sugar ketchup and barbecue sauce. Low-fat or fat-free mayonnaise. Sweets and desserts Sugar-free sweets. The items listed above may not be a complete list of foods and beverages you can eat. Contact a dietitian for more information. What foods should I limit or avoid? Meats and other proteins Limit red meat to 1-2 times a week. Dairy [employer]. Fats and oils Palm oil and coconut oil. Fried foods. Other foods Processed foods. Foods that contain a lot of salt or sodium. Sweets and desserts Sweets that contain sugar. Beverages Sweetened drinks, such as sweet tea, milkshakes, iced sweet drinks, and sodas. Alcohol. The items listed above may not be a complete list  of foods and beverages you should avoid. Contact a dietitian for more information. Where to find more information The [employer] of Diabetes and Digestive and Kidney Diseases: [employer] Summary  Nonalcoholic fatty liver disease is a condition that causes fat to build up in and around the liver.  Following a healthy diet can help to keep nonalcoholic fatty liver disease under control. Your diet should be rich in fruits, vegetables, whole grains, and lean proteins.  Limit your intake of saturated fat. Saturated fat is found in foods that come from animals, including meat and dairy products such as butter, cheese, and whole milk.  This diet promotes weight loss, helps to control blood sugar levels, and helps to improve the way that the body uses insulin. This information is not intended to replace advice given to you by your health care provider. Make sure you discuss any questions you have with your  health care provider. Document Revised: 08/20/2018 Document Reviewed: 05/20/2018 Elsevier Patient Education  [place].

## 2019-12-19 NOTE — Progress Notes (Signed)
Pt iv removed, dressed, belongings gathered and AVS reviewed with pt and family. All questions answered to satisfaction. Pt taken downstairs by volunteer to family car.

## 2019-12-19 NOTE — Care Management Important Message (Signed)
Important Message  Patient Details  Name: Randall Frazier MRN: [id number] Date of Birth: [birth year]   Medicare Important Message Given:  No  Patient refused to sign and left prior to IM delivery.  Mailed the IM to the patient home address.    Attie Nawabi 12/19/2019, 3:02 PM

## 2019-12-19 NOTE — Plan of Care (Signed)

## 2020-02-09 DIAGNOSIS — K7460 Unspecified cirrhosis of liver: Secondary | ICD-10-CM | POA: Insufficient documentation

## 2020-02-09 DIAGNOSIS — K760 Fatty (change of) liver, not elsewhere classified: Secondary | ICD-10-CM | POA: Insufficient documentation

## 2020-02-19 ENCOUNTER — Inpatient Hospital Stay (HOSPITAL_COMMUNITY)
Admission: EM | Admit: 2020-02-19 | Discharge: 2020-02-22 | DRG: 442 | Disposition: A | Discharge status: Home / Self Care | Payer: No Typology Code available for payment source | Attending: Internal Medicine | Admitting: Internal Medicine

## 2020-02-19 ENCOUNTER — Other Ambulatory Visit: Payer: Self-pay

## 2020-02-19 ENCOUNTER — Encounter (HOSPITAL_COMMUNITY): Payer: Self-pay | Admitting: Family Medicine

## 2020-02-19 ENCOUNTER — Emergency Department (HOSPITAL_COMMUNITY): Payer: No Typology Code available for payment source

## 2020-02-19 DIAGNOSIS — E119 Type 2 diabetes mellitus without complications: Secondary | ICD-10-CM

## 2020-02-19 DIAGNOSIS — K7200 Acute and subacute hepatic failure without coma: Secondary | ICD-10-CM | POA: Diagnosis not present

## 2020-02-19 DIAGNOSIS — E1169 Type 2 diabetes mellitus with other specified complication: Secondary | ICD-10-CM

## 2020-02-19 DIAGNOSIS — K259 Gastric ulcer, unspecified as acute or chronic, without hemorrhage or perforation: Secondary | ICD-10-CM | POA: Diagnosis present

## 2020-02-19 DIAGNOSIS — Z951 Presence of aortocoronary bypass graft: Secondary | ICD-10-CM

## 2020-02-19 DIAGNOSIS — Z86011 Personal history of benign neoplasm of the brain: Secondary | ICD-10-CM

## 2020-02-19 DIAGNOSIS — I8500 Esophageal varices without bleeding: Secondary | ICD-10-CM | POA: Diagnosis present

## 2020-02-19 DIAGNOSIS — E861 Hypovolemia: Secondary | ICD-10-CM | POA: Diagnosis present

## 2020-02-19 DIAGNOSIS — I8510 Secondary esophageal varices without bleeding: Secondary | ICD-10-CM | POA: Diagnosis present

## 2020-02-19 DIAGNOSIS — N181 Chronic kidney disease, stage 1: Secondary | ICD-10-CM | POA: Diagnosis present

## 2020-02-19 DIAGNOSIS — K766 Portal hypertension: Secondary | ICD-10-CM | POA: Diagnosis present

## 2020-02-19 DIAGNOSIS — Z79890 Hormone replacement therapy (postmenopausal): Secondary | ICD-10-CM

## 2020-02-19 DIAGNOSIS — Z79899 Other long term (current) drug therapy: Secondary | ICD-10-CM

## 2020-02-19 DIAGNOSIS — E871 Hypo-osmolality and hyponatremia: Secondary | ICD-10-CM | POA: Diagnosis present

## 2020-02-19 DIAGNOSIS — M109 Gout, unspecified: Secondary | ICD-10-CM | POA: Diagnosis present

## 2020-02-19 DIAGNOSIS — K7682 Hepatic encephalopathy: Secondary | ICD-10-CM | POA: Diagnosis present

## 2020-02-19 DIAGNOSIS — R001 Bradycardia, unspecified: Secondary | ICD-10-CM | POA: Diagnosis present

## 2020-02-19 DIAGNOSIS — K7290 Hepatic failure, unspecified without coma: Secondary | ICD-10-CM | POA: Diagnosis present

## 2020-02-19 DIAGNOSIS — Z981 Arthrodesis status: Secondary | ICD-10-CM

## 2020-02-19 DIAGNOSIS — E1122 Type 2 diabetes mellitus with diabetic chronic kidney disease: Secondary | ICD-10-CM | POA: Diagnosis present

## 2020-02-19 DIAGNOSIS — K3189 Other diseases of stomach and duodenum: Secondary | ICD-10-CM | POA: Diagnosis present

## 2020-02-19 DIAGNOSIS — Z20822 Contact with and (suspected) exposure to covid-19: Secondary | ICD-10-CM | POA: Diagnosis present

## 2020-02-19 DIAGNOSIS — K760 Fatty (change of) liver, not elsewhere classified: Secondary | ICD-10-CM | POA: Diagnosis present

## 2020-02-19 DIAGNOSIS — N179 Acute kidney failure, unspecified: Secondary | ICD-10-CM | POA: Diagnosis present

## 2020-02-19 DIAGNOSIS — Z7984 Long term (current) use of oral hypoglycemic drugs: Secondary | ICD-10-CM

## 2020-02-19 DIAGNOSIS — D696 Thrombocytopenia, unspecified: Secondary | ICD-10-CM | POA: Diagnosis present

## 2020-02-19 DIAGNOSIS — Z888 Allergy status to other drugs, medicaments and biological substances status: Secondary | ICD-10-CM

## 2020-02-19 DIAGNOSIS — I2510 Atherosclerotic heart disease of native coronary artery without angina pectoris: Secondary | ICD-10-CM | POA: Diagnosis present

## 2020-02-19 DIAGNOSIS — K7460 Unspecified cirrhosis of liver: Secondary | ICD-10-CM | POA: Diagnosis present

## 2020-02-19 DIAGNOSIS — Z8719 Personal history of other diseases of the digestive system: Secondary | ICD-10-CM

## 2020-02-19 DIAGNOSIS — I129 Hypertensive chronic kidney disease with stage 1 through stage 4 chronic kidney disease, or unspecified chronic kidney disease: Secondary | ICD-10-CM | POA: Diagnosis present

## 2020-02-19 LAB — COMPREHENSIVE METABOLIC PANEL
ALT: 56 U/L — ABNORMAL HIGH (ref 0–44)
AST: 121 U/L — ABNORMAL HIGH (ref 15–41)
Albumin: 2.5 g/dL — ABNORMAL LOW (ref 3.5–5.0)
Alkaline Phosphatase: 101 U/L (ref 38–126)
Anion gap: 7 (ref 5–15)
BUN: 16 mg/dL (ref 8–23)
CO2: 22 mmol/L (ref 22–32)
Calcium: 8.3 mg/dL — ABNORMAL LOW (ref 8.9–10.3)
Chloride: 97 mmol/L — ABNORMAL LOW (ref 98–111)
Creatinine, Ser: 1.2 mg/dL (ref 0.61–1.24)
GFR, Estimated: >60 mL/min (ref >60)
Glucose, Bld: 85 mg/dL (ref 70–99)
Potassium: 4.9 mmol/L (ref 3.5–5.1)
Sodium: 126 mmol/L — ABNORMAL LOW (ref 135–145)
Total Bilirubin: 2.7 mg/dL — ABNORMAL HIGH (ref 0.3–1.2)
Total Protein: 7.1 g/dL (ref 6.5–8.1)

## 2020-02-19 LAB — LACTIC ACID, PLASMA: Lactic Acid, Venous: 2.3 mmol/L (ref 0.5–1.9)

## 2020-02-19 LAB — CBC WITH DIFFERENTIAL/PLATELET
Abs Immature Granulocytes: 0.02 10*3/uL (ref 0.00–0.07)
Basophils Absolute: 0.1 10*3/uL (ref 0.0–0.1)
Basophils Relative: 1 %
Eosinophils Absolute: 0.3 10*3/uL (ref 0.0–0.5)
Eosinophils Relative: 6 %
HCT: 38.5 % — ABNORMAL LOW (ref 39.0–52.0)
Hemoglobin: 13.6 g/dL (ref 13.0–17.0)
Immature Granulocytes: 1 %
Lymphocytes Relative: 28 %
Lymphs Abs: 1.2 10*3/uL (ref 0.7–4.0)
MCH: 34.7 pg — ABNORMAL HIGH (ref 26.0–34.0)
MCHC: 35.3 g/dL (ref 30.0–36.0)
MCV: 98.2 fL (ref 80.0–100.0)
Monocytes Absolute: 0.7 10*3/uL (ref 0.1–1.0)
Monocytes Relative: 16 %
Neutro Abs: 2 10*3/uL (ref 1.7–7.7)
Neutrophils Relative %: 48 %
Platelets: 66 10*3/uL — ABNORMAL LOW (ref 150–400)
RBC: 3.92 MIL/uL — ABNORMAL LOW (ref 4.22–5.81)
RDW: 15.6 % — ABNORMAL HIGH (ref 11.5–15.5)
WBC: 4.2 10*3/uL (ref 4.0–10.5)
nRBC: 0 % (ref 0.0–0.2)

## 2020-02-19 LAB — PROTIME-INR
INR: 1.5 — ABNORMAL HIGH (ref 0.8–1.2)
Prothrombin Time: 17.4 seconds — ABNORMAL HIGH (ref 11.4–15.2)

## 2020-02-19 LAB — ETHANOL: Alcohol, Ethyl (B): <10 mg/dL (ref <10)

## 2020-02-19 LAB — AMMONIA: Ammonia: 102 umol/L — ABNORMAL HIGH (ref 9–35)

## 2020-02-19 MED ORDER — LACTULOSE 10 GM/15ML PO SOLN
30.0000 g | Freq: Once | ORAL | Status: AC
  Administered 2020-02-20: 30 g via ORAL
  Filled 2020-02-19: qty 45

## 2020-02-19 MED ORDER — LORAZEPAM 2 MG/ML IJ SOLN
1.0000 mg | Freq: Once | INTRAMUSCULAR | Status: AC
  Administered 2020-02-19: 1 mg via INTRAVENOUS
  Filled 2020-02-19: qty 1

## 2020-02-19 MED ORDER — SODIUM CHLORIDE 0.9 % IV SOLN: INTRAVENOUS | Status: DC

## 2020-02-19 NOTE — H&P (Signed)
History and Physical    Randall Frazier:[id number] DOB: Sep 03, 1955 DOA: 02/19/2020  PCP: [facility]   Patient coming from: Home   Chief Complaint: Confusion   HPI: Randall Frazier is a 64 y.o. 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  Patient was discharged from a hospital in [place] 1 week ago after admission for hepatic encephalopathy.  Patient has been increasingly confused again over the past 2 days.  His wife reports that he has been disoriented, fell multiple times, and notes that the confusion is worsening.  Patient reports that he has not taken lactulose since the recent hospitalization, states that he does not need it.  He denies any abdominal pain, chest pain, headache, fever, chills, or neck stiffness.  ED Course: Upon arrival to the ED, patient is found to be afebrile, saturating well on room air, bradycardic in the 50s, and with stable blood pressure.  EKG features sinus rhythm with nonspecific IVCD.  Noncontrast head CT is negative for acute intracranial abnormality.  Chemistry panel is notable for sodium of 126, albumin 2.5, AST 121, ALT 56, and total bilirubin 2.7.  Ammonia is elevated to 102.  CBC is notable for stable thrombocytopenia with platelets 66,000.  Lactic acid is mildly elevated and INR is 1.5.  Patient was treated with 1 mg IV Ativan and 30 g of oral lactulose in the ED.  Covid screening test not yet resulted.  Review of Systems:  ROS limited by patient's clinical condition.  Past Medical History:  Diagnosis Date  . Arthritis    shoulder- both, neck, spine, GOUT  . Diabetes mellitus   . Gout   . H/O exercise stress test    many yrs. ago, no need for f/u  with cardiac   . Hypertension   . Pneumonia    while in [employer], [id number]   . Weight loss 2011   100 lbs. weight loss -over 18 mon. period     Past Surgical History:  Procedure Laterality Date  . ANTERIOR CERVICAL DECOMP/DISCECTOMY FUSION N/A 12/20/2012   Procedure: C5-6, C6-7 ANTERIOR CERVICAL DISCECTOMY AND FUSION, ALLOGRAFT, PLATE;  Surgeon: Marybelle Killings, MD;  Location: [facility];  Service: Orthopedics;  Laterality: N/A;  . ESOPHAGOGASTRODUODENOSCOPY (EGD) WITH PROPOFOL Left 12/16/2019   Procedure: ESOPHAGOGASTRODUODENOSCOPY (EGD) WITH PROPOFOL;  Surgeon: Wilford Corner, MD;  Location: [facility];  Service: Endoscopy;  Laterality: Left;  . 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 He has never used smokeless tobacco. He reports current alcohol use. He reports that he does not use drugs.  Allergies  Allergen Reactions  . Pork-Derived Products Other (See Comments)    Cannot eat- gout  . Statins Other (See Comments)    Cramps   . Trazodone And Nefazodone Nausea Only and Other (See Comments)    Caused patient to have a lowered level of tolerance, too  . Lisinopril Rash  . Niacin Rash  . Niacin And Related Rash    Family History  Problem Relation Age of Onset  . Liver disease Sister   . Diabetes Neg Hx      Prior to Admission medications   Medication Sig Start  Date End Date Taking? Authorizing Provider  allopurinol (ZYLOPRIM) 100 MG tablet Take 100 mg by mouth as needed (AS DIRECTED, FOR GOUT FLARES).     [provider]  Bacitracin-Polymyxin B (SM DOUBLE ANTIBIOTIC) 500-10000 UNIT/GM OINT Apply 1 application topically See admin instructions. Apply to affected area of right leg 2 times a day 12/02/19   [provider]  Cholecalciferol (VITAMIN D3) 50 MCG (2000 UT) TABS Take 2,000 Units by mouth daily.    [provider]  diphenhydrAMINE (BENADRYL) 25 mg capsule Take 25 mg by  mouth at bedtime.    [provider]  fish oil-omega-3 fatty acids 1000 MG capsule Take 1,000 mg by mouth daily.     [provider]  lactulose (CHRONULAC) 10 GM/15ML solution Take 30 mLs (20 g total) by mouth 3 (three) times daily as needed for mild constipation. Ensure 2-3 soft BMs daily 12/19/19 03/18/20  [place] Donnamarie Poag, DO  loratadine (CLARITIN) 10 MG tablet Take 10 mg by mouth daily as needed for allergies or rhinitis.    [provider]  LUBRICATING PLUS EYE DROPS 0.5 % SOLN Place 1 drop into both eyes 3 (three) times daily as needed (for irritation).  10/05/19   [provider]  meloxicam (MOBIC) 15 MG tablet Take 15 mg by mouth at bedtime as needed for pain.     [provider]  metFORMIN (GLUCOPHAGE) 1000 MG tablet Take 1,000 mg by mouth 2 (two) times daily with a meal.    [provider]  metoprolol tartrate (LOPRESSOR) 50 MG tablet Take 50 mg by mouth 2 (two) times daily.    [provider]  montelukast (SINGULAIR) 10 MG tablet Take 10 mg by mouth at bedtime.    [provider]  pantoprazole (PROTONIX) 40 MG tablet Take 1 tablet (40 mg total) by mouth 2 (two) times daily. 12/19/19 03/18/20  [place] Donnamarie Poag, DO  spironolactone (ALDACTONE) 25 MG tablet Take 25 mg by mouth 2 (two) times daily.    [provider]  testosterone cypionate (DEPOTESTOTERONE CYPIONATE) 200 MG/ML injection Inject into the muscle every 14 (fourteen) days.     [provider]  THERAPEUTIC SHAMPOO 0.5 % shampoo Apply 1 application topically See admin instructions. Shampoo at least 4 times a week 10/05/19   [provider]  vitamin E (VITAMIN E) 180 MG (400 UNITS) capsule Take 400 Units by mouth daily.    [provider]    Physical Exam: Vitals:   02/19/20 2026  BP: (!) 113/54  Pulse: (!) 51  Resp: 16  Temp: 98.6 F (37 C)  TempSrc: Oral  SpO2: 98%    Constitutional: NAD, sleeping   Eyes: PERTLA, lids and  conjunctivae normal ENMT: Mucous membranes are moist. Posterior pharynx clear of any exudate or lesions.   Neck: normal, supple, no masses, no thyromegaly Respiratory: no wheezing, no crackles. No accessory muscle use.  Cardiovascular: S1 & S2 heard, regular rate and rhythm. No extremity edema. No significant JVD. Abdomen: soft, no tenderness. Bowel sounds active.  Musculoskeletal: no clubbing / cyanosis. No joint deformity upper and lower extremities.   Skin: no significant rashes, lesions, ulcers. Warm, dry, well-perfused. Neurologic: CN 2-12 grossly intact. Sensation intact. Moving all extremties.  Psychiatric: Sleeping, wakes to voice. Oriented to person and place only.    Labs and Imaging on Admission: I have personally reviewed following labs and imaging studies  CBC: Recent Labs  Lab 02/19/20 2048  WBC 4.2  NEUTROABS 2.0  HGB 13.6  HCT 38.5*  MCV 98.2  PLT 66*   Basic Metabolic Panel: Recent Labs  Lab 02/19/20 2048  NA 126*  K 4.9  CL 97*  CO2 22  GLUCOSE 85  BUN 16  CREATININE 1.20  CALCIUM 8.3*   GFR: CrCl cannot be calculated (Unknown ideal weight.). Liver Function Tests: Recent Labs  Lab 02/19/20 2048  AST 121*  ALT 56*  ALKPHOS 101  BILITOT 2.7*  PROT 7.1  ALBUMIN 2.5*   No results for input(s): LIPASE, AMYLASE in the last 168 hours. Recent Labs  Lab 02/19/20 2049  AMMONIA 102*   Coagulation Profile: Recent Labs  Lab 02/19/20 2048  INR 1.5*   Cardiac Enzymes: No results for input(s): CKTOTAL, CKMB, CKMBINDEX, TROPONINI in the last 168 hours. BNP (last 3 results) No results for input(s): PROBNP in the last 8760 hours. HbA1C: No results for input(s): HGBA1C in the last 72 hours. CBG: No results for input(s): GLUCAP in the last 168 hours. Lipid Profile: No results for input(s): CHOL, HDL, LDLCALC, TRIG, CHOLHDL, LDLDIRECT in the last 72 hours. Thyroid Function Tests: No results for input(s): TSH, T4TOTAL, FREET4, T3FREE, THYROIDAB in  the last 72 hours. Anemia Panel: No results for input(s): VITAMINB12, FOLATE, FERRITIN, TIBC, IRON, RETICCTPCT in the last 72 hours. Urine analysis:    Component Value Date/Time   COLORURINE AMBER (A) 12/14/2019 2200   APPEARANCEUR HAZY (A) 12/14/2019 2200   LABSPEC 1.020 12/14/2019 2200   PHURINE 5.0 12/14/2019 2200   GLUCOSEU NEGATIVE 12/14/2019 2200   HGBUR LARGE (A) 12/14/2019 2200   BILIRUBINUR NEGATIVE 12/14/2019 2200   KETONESUR NEGATIVE 12/14/2019 2200   PROTEINUR 30 (A) 12/14/2019 2200   UROBILINOGEN 1.0 12/16/2012 1114   NITRITE NEGATIVE 12/14/2019 2200   LEUKOCYTESUR NEGATIVE 12/14/2019 2200   Sepsis Labs: @LABRCNTIP (procalcitonin:4,lacticidven:4) )No results found for this or any previous visit (from the past 240 hour(s)).   Radiological Exams on Admission: CT Head Wo Contrast  Result Date: 02/19/2020 CLINICAL DATA:  Cirrhosis, elevated ammonia level since COVID vaccine EXAM: CT HEAD WITHOUT CONTRAST TECHNIQUE: Contiguous axial images were obtained from the base of the skull through the vertex without intravenous contrast. COMPARISON:  CT 12/14/2019 FINDINGS: Brain: No evidence of acute infarction, hemorrhage, hydrocephalus, extra-axial collection, visible mass lesion or mass effect. Symmetric prominence of the ventricles, cisterns and sulci compatible with parenchymal volume loss. Senescent mineralization of the basal ganglia. Patchy areas of white matter hypoattenuation are most compatible with chronic microvascular angiopathy. Vascular: Atherosclerotic calcification of the carotid siphons. No hyperdense vessel. Skull: Chronic scarring/soft tissue infiltration in the left supraorbital scalp. Scattered benign punctate dermal calcifications. No acute scalp swelling or hematoma. No calvarial fracture or worrisome osseous lesion. Sinuses/Orbits: Paranasal sinuses and mastoid air cells are predominantly clear. Included orbital structures are unremarkable. Other: None IMPRESSION:  1. No acute intracranial abnormality. 2. Chronic microvascular angiopathy and parenchymal volume loss. 3. Stable scarring/soft tissue infiltration in the left supraorbital scalp. Electronically Signed   By: Lovena Le M.D.   On: 02/19/2020 22:29    EKG: Independently reviewed. Sinus rhythm, non-specific IVCD.   Assessment/Plan   1. Hepatic encephalopathy; cirrhosis  - Patient with NAFLD progressed to cirrhosis presents with confusion, reports that he has not been taking lactulose at home, and is found to have ammonia level 102, no acute findings on head CT, no focal deficit, no meningismus or evidence for acute infection  - Resume lactulose and titrate to achieve multiple soft or loose BMs daily, continue supportive  care, fall precautions    2. PUD, recent GI bleed - Hgb is stable, no evidence for acute bleeding  - Continue Protonix   3. Hypertension  - BP at goal, pharmacy medication-reconciliation pending, will treat as-needed for now    4. Hx of type II DM  - A1c was only 5.4% this month and serum glucose 85 on admission  - Monitor glucose, use low-intensity SSI if needed    5. Thrombocytopenia  - Platelets 66k on admission  - Appears stable, likely secondary to liver disease    6. Chronic hyponatremia  - Serum sodium is 126 in setting of cirrhosis and apparent hypovolemia, down from 131 a week earlier  - He appears hypovolemic on admission, plan to hold diuretic initially, hydrate gently with IVF overnight, and repeat chem panel in am    DVT prophylaxis: SCDs Code Status: Full  Family Communication: Patient's wife updated from ED  Disposition Plan:  Patient is from: Home  Anticipated d/c is to: TBD Anticipated d/c date is: Possibly as early as 02/20/20 Patient currently: Encephalopathic  Consults called: None  Admission status: Inpatient     Vianne Bulls, MD [facility]  02/19/2020, 11:26 PM

## 2020-02-19 NOTE — ED Provider Notes (Signed)
[facility] EMERGENCY DEPARTMENT Provider Note   CSN: [id number] Arrival date & time: 02/19/20  2021     History Chief Complaint  Patient presents with  . Altered Mental Status    Randall Frazier is a 64 y.o. male with past medical history of cirrhosis, diabetes, gout, hypertension who presents via EMS with altered mental status.  Patient's wife states that he got his Covid vaccine on Friday and since then she thinks his ammonia levels have been rising because he is getting more confused. He has been defecating in the kitchen and urinating in the refrigerator. He was more altered today, so she called for EMS. He has also fallen several time today.   Chart review, patient had a recent hospital admission on 9/30 for acute hepatic encephalopathy.  His wife states that this is his third episode of the same.  On evaluation, patient is drowsy and not oriented.  He starts to rattle off numbers when asked what his name is, what the years, or where he is.   Altered Mental Status Presenting symptoms: behavior changes and disorientation   Severity:  Severe Most recent episode:  Today Episode history:  Continuous Duration:  3 days Timing:  Constant Progression:  Worsening Chronicity:  Recurrent Context: not alcohol use        Past Medical History:  Diagnosis Date  . Arthritis    shoulder- both, neck, spine, GOUT  . Diabetes mellitus   . Gout   . H/O exercise stress test    many yrs. ago, no need for f/u with cardiac   . Hypertension   . Pneumonia    while in [employer], [id number]   . Weight loss 2011   100 lbs. weight loss -over 18 mon. period     Patient Active Problem List   Diagnosis Date Noted  . Hepatic encephalopathy ([place]) 02/19/2020  . Portal hypertension with esophageal varices ([facility]) 12/16/2019  . Duodenal ulcer 12/16/2019  . Gastric ulcer 12/16/2019  . Gastritis 12/16/2019  . DM2 (diabetes mellitus, type 2) ([place]) 12/15/2019  . Decompensated hepatic  cirrhosis ([place]) 12/15/2019  . CAD (coronary artery disease) 12/15/2019  . Hyperlipemia 12/15/2019  . Essential hypertension 12/15/2019  . Dyspnea 12/15/2019  . Thrombocytopenia ([place]) 12/15/2019  . AKI (acute kidney injury) ([place]) 12/15/2019  . Hypoalbuminemia 12/15/2019  . Acute hepatic encephalopathy 12/15/2019  . Hyponatremia 12/15/2019  . Cellulitis of right leg 12/15/2019  . Melena 12/14/2019  . Cervical spondylosis without myelopathy 12/20/2012    Class: Diagnosis of  . Biliary dyskinesia 10/05/2012    Past Surgical History:  Procedure Laterality Date  . ANTERIOR CERVICAL DECOMP/DISCECTOMY FUSION N/A 12/20/2012   Procedure: C5-6, C6-7 ANTERIOR CERVICAL DISCECTOMY AND FUSION, ALLOGRAFT, PLATE;  Surgeon: Marybelle Killings, MD;  Location: [facility];  Service: Orthopedics;  Laterality: N/A;  . ESOPHAGOGASTRODUODENOSCOPY (EGD) WITH PROPOFOL Left 12/16/2019   Procedure: ESOPHAGOGASTRODUODENOSCOPY (EGD) WITH PROPOFOL;  Surgeon: Wilford Corner, MD;  Location: [facility];  Service: Endoscopy;  Laterality: Left;  . 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 Liver disease Sister   . Diabetes Neg Hx     Social History   Tobacco Use  . Smoking status: Never Smoker  . Smokeless tobacco: Never Used  Substance Use Topics  . Alcohol use: Yes    Comment: 2 beers / year- very rare  . Drug use:  No    Home Medications Prior to Admission medications   Medication Sig Start Date End Date Taking? Authorizing Provider  allopurinol (ZYLOPRIM) 100 MG tablet Take 100 mg by mouth as needed (AS DIRECTED, FOR GOUT FLARES).     [provider]  Bacitracin-Polymyxin B (SM DOUBLE ANTIBIOTIC) 500-10000 UNIT/GM OINT Apply 1 application topically See admin instructions. Apply to affected area of right leg 2 times a day 12/02/19   [provider]  Cholecalciferol (VITAMIN D3) 50 MCG (2000 UT) TABS Take 2,000 Units by  mouth daily.    [provider]  diphenhydrAMINE (BENADRYL) 25 mg capsule Take 25 mg by mouth at bedtime.    [provider]  fish oil-omega-3 fatty acids 1000 MG capsule Take 1,000 mg by mouth daily.     [provider]  lactulose (CHRONULAC) 10 GM/15ML solution Take 30 mLs (20 g total) by mouth 3 (three) times daily as needed for mild constipation. Ensure 2-3 soft BMs daily 12/19/19 03/18/20  [place] Donnamarie Poag, DO  loratadine (CLARITIN) 10 MG tablet Take 10 mg by mouth daily as needed for allergies or rhinitis.    [provider]  LUBRICATING PLUS EYE DROPS 0.5 % SOLN Place 1 drop into both eyes 3 (three) times daily as needed (for irritation).  10/05/19   [provider]  meloxicam (MOBIC) 15 MG tablet Take 15 mg by mouth at bedtime as needed for pain.     [provider]  metFORMIN (GLUCOPHAGE) 1000 MG tablet Take 1,000 mg by mouth 2 (two) times daily with a meal.    [provider]  metoprolol tartrate (LOPRESSOR) 50 MG tablet Take 50 mg by mouth 2 (two) times daily.    [provider]  montelukast (SINGULAIR) 10 MG tablet Take 10 mg by mouth at bedtime.    [provider]  pantoprazole (PROTONIX) 40 MG tablet Take 1 tablet (40 mg total) by mouth 2 (two) times daily. 12/19/19 03/18/20  [place] Donnamarie Poag, DO  spironolactone (ALDACTONE) 25 MG tablet Take 25 mg by mouth 2 (two) times daily.    [provider]  testosterone cypionate (DEPOTESTOTERONE CYPIONATE) 200 MG/ML injection Inject into the muscle every 14 (fourteen) days.     [provider]  THERAPEUTIC SHAMPOO 0.5 % shampoo Apply 1 application topically See admin instructions. Shampoo at least 4 times a week 10/05/19   [provider]  vitamin E (VITAMIN E) 180 MG (400 UNITS) capsule Take 400 Units by mouth daily.    [provider]    Allergies    Pork-derived products, Statins, Trazodone and nefazodone, Lisinopril, Niacin, and Niacin  and related  Review of Systems   Review of Systems  Unable to perform ROS: Mental status change    Physical Exam Updated Vital Signs BP (!) 113/54 (BP Location: Right Arm)   Pulse (!) 51   Temp 98.6 F (37 C) (Oral)   Resp 16   SpO2 98%   Physical Exam Vitals and nursing note reviewed.  Constitutional:      General: He is not in acute distress.    Appearance: He is well-developed. He is ill-appearing.  HENT:     Head: Normocephalic and atraumatic.     Mouth/Throat:     Pharynx: Oropharynx is clear.  Eyes:     Conjunctiva/sclera: Conjunctivae normal.     Pupils: Pupils are equal, round, and reactive to light.  Cardiovascular:     Rate and Rhythm: Normal rate and regular rhythm.  Heart sounds: Normal heart sounds. No murmur heard.   Pulmonary:     Effort: Pulmonary effort is normal. No respiratory distress.     Breath sounds: Normal breath sounds.  Abdominal:     Palpations: Abdomen is soft.     Tenderness: There is no abdominal tenderness.  Musculoskeletal:     Cervical back: Neck supple.  Skin:    General: Skin is warm and dry.  Neurological:     Mental Status: He is alert. He is disoriented.     Comments: Moves all extremities spontaneously.  He is altered and cannot cooperate with a neurologic exam.     ED Results / Procedures / Treatments   Labs (all labs ordered are listed, but only abnormal results are displayed) Labs Reviewed  COMPREHENSIVE METABOLIC PANEL - Abnormal; Notable for the following components:      Result Value   Sodium 126 (*)    Chloride 97 (*)    Calcium 8.3 (*)    Albumin 2.5 (*)    AST 121 (*)    ALT 56 (*)    Total Bilirubin 2.7 (*)    All other components within normal limits  PROTIME-INR - Abnormal; Notable for the following components:   Prothrombin Time 17.4 (*)    INR 1.5 (*)    All other components within normal limits  AMMONIA - Abnormal; Notable for the following components:   Ammonia 102 (*)    All other  components within normal limits  CBC WITH DIFFERENTIAL/PLATELET - Abnormal; Notable for the following components:   RBC 3.92 (*)    HCT 38.5 (*)    MCH 34.7 (*)    RDW 15.6 (*)    Platelets 66 (*)    All other components within normal limits  LACTIC ACID, PLASMA - Abnormal; Notable for the following components:   Lactic Acid, Venous 2.3 (*)    All other components within normal limits  RESPIRATORY PANEL BY RT PCR (FLU A&B, COVID)  ETHANOL     EKG EKG Interpretation  Date/Time:  Sunday February 19 2020 20:52:03 EDT Ventricular Rate:  55 PR Interval:    QRS Duration: 125 QT Interval:  514 QTC Calculation: 492 R Axis:   30 Text Interpretation: Sinus rhythm Nonspecific intraventricular conduction delay Confirmed by Elnora Morrison [phone]) on 02/19/2020 10:14:18 PM   Radiology CT Head Wo Contrast  Result Date: 02/19/2020 CLINICAL DATA:  Cirrhosis, elevated ammonia level since COVID vaccine EXAM: CT HEAD WITHOUT CONTRAST TECHNIQUE: Contiguous axial images were obtained from the base of the skull through the vertex without intravenous contrast. COMPARISON:  CT 12/14/2019 FINDINGS: Brain: No evidence of acute infarction, hemorrhage, hydrocephalus, extra-axial collection, visible mass lesion or mass effect. Symmetric prominence of the ventricles, cisterns and sulci compatible with parenchymal volume loss. Senescent mineralization of the basal ganglia. Patchy areas of white matter hypoattenuation are most compatible with chronic microvascular angiopathy. Vascular: Atherosclerotic calcification of the carotid siphons. No hyperdense vessel. Skull: Chronic scarring/soft tissue infiltration in the left supraorbital scalp. Scattered benign punctate dermal calcifications. No acute scalp swelling or hematoma. No calvarial fracture or worrisome osseous lesion. Sinuses/Orbits: Paranasal sinuses and mastoid air cells are predominantly clear. Included orbital structures are unremarkable. Other: None  IMPRESSION: 1. No acute intracranial abnormality. 2. Chronic microvascular angiopathy and parenchymal volume loss. 3. Stable scarring/soft tissue infiltration in the left supraorbital scalp. Electronically Signed   By: Lovena Le M.D.   On: 02/19/2020 22:29    Procedures Procedures (including critical care time)  Medications Ordered in ED Medications  lactulose (CHRONULAC) 10 GM/15ML solution 30 g (has no administration in time range)  0.9 %  sodium chloride infusion (has no administration in time range)  LORazepam (ATIVAN) injection 1 mg (1 mg Intravenous Given 02/19/20 2115)    ED Course  I have reviewed the triage vital signs and the nursing notes.  Pertinent labs & imaging results that were available during my care of the patient were reviewed by me and considered in my medical decision making (see chart for details).    MDM Rules/Calculators/A&P                         CT head negative for intracranial bleeding.  Symptoms consistent with hepatic encephalopathy.  This is supported by ammonia 138, NA 126, AST 121, ALT 56.  Patient does not have a leukocytosis, and I do not suspect his altered mental status to be secondary to infection, as it is identical in presentation to his prior episodes of hepatic encephalopathy. Dose of lactulose given. Patient will be admitted for further management.  This patient was seen with Dr. Reather Converse.  Final Clinical Impression(s) / ED Diagnoses Final diagnoses:  Hepatic encephalopathy [facility])    Rx / DC Orders ED Discharge Orders    None       Asencion Noble, MD 02/20/20 Anda Latina, MD 02/20/20 0030

## 2020-02-19 NOTE — ED Triage Notes (Signed)
Pt to ED via [facility] with c/o altered mental status.  EMS reports pt's wife st's pt has cirrhosis of the liver and since getting his covid vaccine pt's ammonia level has been rising and his mental status has been altered

## 2020-02-20 DIAGNOSIS — K766 Portal hypertension: Secondary | ICD-10-CM | POA: Diagnosis present

## 2020-02-20 DIAGNOSIS — I129 Hypertensive chronic kidney disease with stage 1 through stage 4 chronic kidney disease, or unspecified chronic kidney disease: Secondary | ICD-10-CM | POA: Diagnosis present

## 2020-02-20 DIAGNOSIS — I8510 Secondary esophageal varices without bleeding: Secondary | ICD-10-CM | POA: Diagnosis present

## 2020-02-20 DIAGNOSIS — K259 Gastric ulcer, unspecified as acute or chronic, without hemorrhage or perforation: Secondary | ICD-10-CM | POA: Diagnosis present

## 2020-02-20 DIAGNOSIS — K760 Fatty (change of) liver, not elsewhere classified: Secondary | ICD-10-CM | POA: Diagnosis present

## 2020-02-20 DIAGNOSIS — Z20822 Contact with and (suspected) exposure to covid-19: Secondary | ICD-10-CM | POA: Diagnosis present

## 2020-02-20 DIAGNOSIS — N179 Acute kidney failure, unspecified: Secondary | ICD-10-CM | POA: Diagnosis present

## 2020-02-20 DIAGNOSIS — K7460 Unspecified cirrhosis of liver: Secondary | ICD-10-CM | POA: Diagnosis present

## 2020-02-20 DIAGNOSIS — K3189 Other diseases of stomach and duodenum: Secondary | ICD-10-CM | POA: Diagnosis present

## 2020-02-20 DIAGNOSIS — Z86011 Personal history of benign neoplasm of the brain: Secondary | ICD-10-CM | POA: Diagnosis not present

## 2020-02-20 DIAGNOSIS — K7290 Hepatic failure, unspecified without coma: Secondary | ICD-10-CM | POA: Diagnosis present

## 2020-02-20 DIAGNOSIS — Z7984 Long term (current) use of oral hypoglycemic drugs: Secondary | ICD-10-CM | POA: Diagnosis not present

## 2020-02-20 DIAGNOSIS — I2510 Atherosclerotic heart disease of native coronary artery without angina pectoris: Secondary | ICD-10-CM | POA: Diagnosis present

## 2020-02-20 DIAGNOSIS — E861 Hypovolemia: Secondary | ICD-10-CM | POA: Diagnosis present

## 2020-02-20 DIAGNOSIS — Z981 Arthrodesis status: Secondary | ICD-10-CM | POA: Diagnosis not present

## 2020-02-20 DIAGNOSIS — Z951 Presence of aortocoronary bypass graft: Secondary | ICD-10-CM | POA: Diagnosis not present

## 2020-02-20 DIAGNOSIS — M109 Gout, unspecified: Secondary | ICD-10-CM | POA: Diagnosis present

## 2020-02-20 DIAGNOSIS — R001 Bradycardia, unspecified: Secondary | ICD-10-CM | POA: Diagnosis present

## 2020-02-20 DIAGNOSIS — E1122 Type 2 diabetes mellitus with diabetic chronic kidney disease: Secondary | ICD-10-CM | POA: Diagnosis present

## 2020-02-20 DIAGNOSIS — D696 Thrombocytopenia, unspecified: Secondary | ICD-10-CM | POA: Diagnosis present

## 2020-02-20 DIAGNOSIS — Z79890 Hormone replacement therapy (postmenopausal): Secondary | ICD-10-CM | POA: Diagnosis not present

## 2020-02-20 DIAGNOSIS — E871 Hypo-osmolality and hyponatremia: Secondary | ICD-10-CM | POA: Diagnosis present

## 2020-02-20 DIAGNOSIS — K7200 Acute and subacute hepatic failure without coma: Secondary | ICD-10-CM | POA: Diagnosis present

## 2020-02-20 DIAGNOSIS — N181 Chronic kidney disease, stage 1: Secondary | ICD-10-CM | POA: Diagnosis present

## 2020-02-20 LAB — AMMONIA: Ammonia: 77 umol/L — ABNORMAL HIGH (ref 9–35)

## 2020-02-20 LAB — RESPIRATORY PANEL BY RT PCR (FLU A&B, COVID)
Influenza A by PCR: NEGATIVE
Influenza B by PCR: NEGATIVE
SARS Coronavirus 2 by RT PCR: NEGATIVE

## 2020-02-20 LAB — MAGNESIUM: Magnesium: 1.5 mg/dL — ABNORMAL LOW (ref 1.7–2.4)

## 2020-02-20 MED ORDER — SODIUM CHLORIDE 0.9% FLUSH: 3.0000 mL | INTRAVENOUS | Status: DC | PRN

## 2020-02-20 MED ORDER — SODIUM CHLORIDE 0.9% FLUSH
3.0000 mL | Freq: Two times a day (BID) | INTRAVENOUS | Status: DC
  Administered 2020-02-20 – 2020-02-21 (×4): 3 mL via INTRAVENOUS

## 2020-02-20 MED ORDER — LACTULOSE 10 GM/15ML PO SOLN
30.0000 g | Freq: Three times a day (TID) | ORAL | Status: DC
  Administered 2020-02-20 – 2020-02-22 (×7): 30 g via ORAL
  Filled 2020-02-20 (×8): qty 45

## 2020-02-20 MED ORDER — ONDANSETRON HCL 4 MG/2ML IJ SOLN: 4.0000 mg | Freq: Four times a day (QID) | INTRAMUSCULAR | Status: DC | PRN

## 2020-02-20 MED ORDER — MAGNESIUM OXIDE 400 (241.3 MG) MG PO TABS
800.0000 mg | ORAL_TABLET | Freq: Two times a day (BID) | ORAL | Status: DC
  Administered 2020-02-20 – 2020-02-22 (×5): 800 mg via ORAL
  Filled 2020-02-20 (×5): qty 2

## 2020-02-20 MED ORDER — PANTOPRAZOLE SODIUM 40 MG PO TBEC
40.0000 mg | DELAYED_RELEASE_TABLET | Freq: Two times a day (BID) | ORAL | Status: DC
  Administered 2020-02-20 – 2020-02-22 (×5): 40 mg via ORAL
  Filled 2020-02-20 (×5): qty 1

## 2020-02-20 MED ORDER — SODIUM CHLORIDE 0.9 % IV SOLN: 250.0000 mL | INTRAVENOUS | Status: DC | PRN

## 2020-02-20 MED ORDER — ONDANSETRON HCL 4 MG PO TABS: 4.0000 mg | ORAL_TABLET | Freq: Four times a day (QID) | ORAL | Status: DC | PRN

## 2020-02-20 NOTE — Progress Notes (Signed)
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 Arlan Organ Dr. Vallarie Mare [facility] DM TY 2 Prior gout GI bleed with esophageal varices on EGD 12/16/2019 with moderate portal gastropathy and was followed by Dr. Michail Sermon etc. PUD HTN CAD status post CABG likely sometime in 2020   Assessment & Plan:   Principal Problem:   Hepatic encephalopathy ([place]) Active Problems:   DM2 (diabetes mellitus, type 2) ([place])   Decompensated hepatic cirrhosis ([place])   CAD (coronary artery disease)   Hyponatremia   Portal hypertension with esophageal varices ([place])   Gastric ulcer   1. Acute hepatic encephalopathy superimposed on NAFLD cirrhosis a. Patient is only taking 15 mils of lactulose without regular stools b. Long discussion with wife who tells me patient quite confused at home for the past 3 months c. Increased to lactulose 30 3 times daily-resume Xifaxan 550 twice daily in addition d. At baseline also uses Aldactone 25 daily which has been held for the time being 2. AKI on admission a. 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 Monitor trends holding fluids as above 3.  recent upper GI bleed: EGD 12/16/2019 a. Monitor trends continue pantoprazole 1 tablet twice daily b. Repeat labs a.m. 4. HTN a. Continue meds as an outpatient is only on Aldactone which has been held secondary to mild azotemia b. Adding back metoprolol 5. DM TY 2 a. At baseline is on Metformin 1000 twice daily which has been held b. Monitor trends if above 180 start sliding scale 6. NAFLD cirrhosis with thrombocytopeniA Chronic hyponatremia a. Aldactone on hold as above may require Lasix but will monitor creatinine trends prior  to initiating b. As above discussion 7. CAD status post CABG a. not on aspirin-we will inquire  b. Resume metoprolol 50 twice daily 8. 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 Outpatient follow-up at [facility] as above  DVT prophylaxis: none Code Status: Full Family Communication: called carol wife [phone] on 10/11 with long discussion about planning Disposition:   Status is: Observation  The patient will require care spanning > 2 midnights and should be moved to inpatient because: Persistent severe electrolyte disturbances, Ongoing active pain requiring inpatient pain management, Unsafe d/c plan and IV treatments appropriate due to intensity of illness or inability to take PO  Dispo: The patient is from: Home              Anticipated d/c is to: Home              Anticipated d/c date is: 3 days              Patient currently is not medically stable to d/c.   Consultants:   none  Procedures: none  Antimicrobials: none    Subjective:  Arouses but overall somewhat sleepy-however was able to get up with assistance to the restroom although unsteady Does not recall much from prior No chest pain no fever no chills no other overnight issues  Objective: Vitals:   02/20/20 0500 02/20/20 0515 02/20/20 0530 02/20/20 0545  BP: 114/62 113/65 136/79 132/74  Pulse:    (!) 56  Resp: 13 14  18   Temp:      TempSrc:  SpO2:    99%   No intake or output data in the 24 hours ending 02/20/20 0732 There were no vitals filed for this visit.  Examination:  General exam: EOMI NCAT no focal deficit slightly icteric no pallor Respiratory system: CTA B no added sounds rales rhonchi Cardiovascular system: S1-S2 no murmur Gastrointestinal system: Obese nondistended no rebound no guarding. Central nervous system: No asterixis moving all 4 limbs to command but quite sleepy Extremities: Grade 1 lower extremity edema Skin: As above Psychiatry: Euthymic  pleasant  Data Reviewed: I have personally reviewed following labs and imaging studies Sodium 126 chloride 97 BUNs/creatinine 16/1.2 Albumin 2.5 AST 121/ALT 56 Lactic acid 2.4 Platelets 66, INR 1.5 Hemoglobin 13 White count 4.2  Radiology Studies: CT Head Wo Contrast  Result Date: 02/19/2020 CLINICAL DATA:  Cirrhosis, elevated ammonia level since COVID vaccine EXAM: CT HEAD WITHOUT CONTRAST TECHNIQUE: Contiguous axial images were obtained from the base of the skull through the vertex without intravenous contrast. COMPARISON:  CT 12/14/2019 FINDINGS: Brain: No evidence of acute infarction, hemorrhage, hydrocephalus, extra-axial collection, visible mass lesion or mass effect. Symmetric prominence of the ventricles, cisterns and sulci compatible with parenchymal volume loss. Senescent mineralization of the basal ganglia. Patchy areas of white matter hypoattenuation are most compatible with chronic microvascular angiopathy. Vascular: Atherosclerotic calcification of the carotid siphons. No hyperdense vessel. Skull: Chronic scarring/soft tissue infiltration in the left supraorbital scalp. Scattered benign punctate dermal calcifications. No acute scalp swelling or hematoma. No calvarial fracture or worrisome osseous lesion. Sinuses/Orbits: Paranasal sinuses and mastoid air cells are predominantly clear. Included orbital structures are unremarkable. Other: None IMPRESSION: 1. No acute intracranial abnormality. 2. Chronic microvascular angiopathy and parenchymal volume loss. 3. Stable scarring/soft tissue infiltration in the left supraorbital scalp. Electronically Signed   By: Lovena Le M.D.   On: 02/19/2020 22:29     Scheduled Meds: . lactulose  30 g Oral TID  . pantoprazole  40 mg Oral BID  . sodium chloride flush  3 mL Intravenous Q12H   Continuous Infusions: . sodium chloride    . sodium chloride 100 mL/hr at 02/20/20 0549     LOS: 0 days    Time spent: [place],  MD [facility] To contact the attending provider between 7A-7P or the covering provider during after hours 7P-7A, please log into the web site [email or web address] and access using universal  password for that web site. If you do not have the password, please call the hospital operator.  02/20/2020, 7:32 AM

## 2020-02-20 NOTE — ED Notes (Signed)
RN assumes care of pt , pt denies any needs at this time , pt resting , attached to cardiac monitor , VSS , pt appears to be in NAD at this time , will continue to monitor, bed low and locked, rails x2. Call light in hand

## 2020-02-20 NOTE — ED Notes (Signed)
Pt continues to be confused.  Trying to get out of bed. Sitter at bedside

## 2020-02-20 NOTE — ED Notes (Signed)
Pt resting quietly at this time.  Sitter remains at bedside

## 2020-02-21 LAB — CBC WITH DIFFERENTIAL/PLATELET
Abs Immature Granulocytes: 0.01 10*3/uL (ref 0.00–0.07)
Basophils Absolute: 0.1 10*3/uL (ref 0.0–0.1)
Basophils Relative: 1 %
Eosinophils Absolute: 0.2 10*3/uL (ref 0.0–0.5)
Eosinophils Relative: 4 %
HCT: 35 % — ABNORMAL LOW (ref 39.0–52.0)
Hemoglobin: 12.8 g/dL — ABNORMAL LOW (ref 13.0–17.0)
Immature Granulocytes: 0 %
Lymphocytes Relative: 22 %
Lymphs Abs: 1.1 10*3/uL (ref 0.7–4.0)
MCH: 34.9 pg — ABNORMAL HIGH (ref 26.0–34.0)
MCHC: 36.6 g/dL — ABNORMAL HIGH (ref 30.0–36.0)
MCV: 95.4 fL (ref 80.0–100.0)
Monocytes Absolute: 0.9 10*3/uL (ref 0.1–1.0)
Monocytes Relative: 19 %
Neutro Abs: 2.6 10*3/uL (ref 1.7–7.7)
Neutrophils Relative %: 54 %
Platelets: 53 10*3/uL — ABNORMAL LOW (ref 150–400)
RBC: 3.67 MIL/uL — ABNORMAL LOW (ref 4.22–5.81)
RDW: 15.2 % (ref 11.5–15.5)
WBC: 4.9 10*3/uL (ref 4.0–10.5)
nRBC: 0 % (ref 0.0–0.2)

## 2020-02-21 LAB — COMPREHENSIVE METABOLIC PANEL
ALT: 44 U/L (ref 0–44)
AST: 100 U/L — ABNORMAL HIGH (ref 15–41)
Albumin: 2.2 g/dL — ABNORMAL LOW (ref 3.5–5.0)
Alkaline Phosphatase: 64 U/L (ref 38–126)
Anion gap: 6 (ref 5–15)
BUN: 10 mg/dL (ref 8–23)
CO2: 22 mmol/L (ref 22–32)
Calcium: 8 mg/dL — ABNORMAL LOW (ref 8.9–10.3)
Chloride: 100 mmol/L (ref 98–111)
Creatinine, Ser: 1.15 mg/dL (ref 0.61–1.24)
GFR, Estimated: >60 mL/min (ref >60)
Glucose, Bld: 95 mg/dL (ref 70–99)
Potassium: 4.1 mmol/L (ref 3.5–5.1)
Sodium: 128 mmol/L — ABNORMAL LOW (ref 135–145)
Total Bilirubin: 3.2 mg/dL — ABNORMAL HIGH (ref 0.3–1.2)
Total Protein: 6.3 g/dL — ABNORMAL LOW (ref 6.5–8.1)

## 2020-02-21 MED ORDER — MAGNESIUM SULFATE 2 GM/50ML IV SOLN
2.0000 g | Freq: Once | INTRAVENOUS | Status: AC
  Administered 2020-02-21: 2 g via INTRAVENOUS
  Filled 2020-02-21: qty 50

## 2020-02-21 NOTE — Plan of Care (Signed)
  Problem: Safety: Goal: Ability to remain free from injury will improve Outcome: Progressing   

## 2020-02-21 NOTE — Progress Notes (Addendum)
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 Arlan Organ Dr. Vallarie Mare [facility] DM TY 2 Prior gout GI bleed with esophageal varices on EGD 12/16/2019 with moderate portal gastropathy and was followed by Dr. Michail Sermon etc. PUD HTN CAD status post CABG likely sometime in 2020   Assessment & Plan:   Principal Problem:   Hepatic encephalopathy ([place]) Active Problems:   DM2 (diabetes mellitus, type 2) ([place])   Decompensated hepatic cirrhosis ([place])   CAD (coronary artery disease)   Hyponatremia   Portal hypertension with esophageal varices ([place])   Gastric ulcer   1. Acute hepatic encephalopathy superimposed on NAFLD cirrhosis a. Patient is only taking 15 mls of lactulose without regular stools b. Much less confused than prior-was confused for 3 months at home c. Responding well to higher dose lactulose 30 3 times daily-continue Xifaxan 550 ii/day d. At baseline also uses Aldactone 25 daily-resume in the next 1 to 2 days 2. AKI on admission a. 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 Monitor trends holding fluids as above 3.  recent upper GI bleed: EGD 12/16/2019 a. Monitor trends continue pantoprazole 1 tablet twice daily b. Repeat labs a.m. 4. HTN a. Continue meds as an outpatient is only on Aldactone which has been held secondary to mild azotemia b. Adding back metoprolol 5. DM TY 2 a. At baseline is on Metformin 1000 twice daily which has been held b. CBGs are less than 100 eating 60% of meals, would hold sliding scale for now 6. NAFLD cirrhosis with thrombocytopeniA Chronic hyponatremia a. Aldactone on hold as above may require Lasix but will monitor creatinine  trends prior to initiating b. As above discussion 7. CAD status post CABG a. not on aspirin b. Resume metoprolol 50 twice daily 8. Hypomagnesemia a. Replacing with oral 800 twice daily and giving 1 g today recheck in a.m. 9. 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 Outpatient follow-up at [facility] as above  DVT prophylaxis: none Code Status: Full Family Communication: called carol wife [phone] on 10/11 with long discussion-no family available today-patient much more oriented Disposition:   Status is: Observation  The patient will require care spanning > 2 midnights and should be moved to inpatient because: Persistent severe electrolyte disturbances, Ongoing active pain requiring inpatient pain management, Unsafe d/c plan and IV treatments appropriate due to intensity of illness or inability to take PO  Dispo: The patient is from: Home              Anticipated d/c is to: Home              Anticipated d/c date is: 3 days              Patient currently is not medically stable to d/c.   Consultants:   none  Procedures: none  Antimicrobials: none    Subjective:  Much more arousable less sleepy can orient to place person time season No chest pain no fever no chills  Objective: Vitals:   02/20/20 2100 02/20/20 2224 02/21/20 0354 02/21/20 0700  BP: (!) 116/58 130/64 (!) 106/44 114/73  Pulse: 67 68 61 71  Resp: 18 14  16   Temp:  98.5 F (36.9 C) 98 F (36.7 C) 98.1 F (36.7 C)  TempSrc:  Tympanic Oral Oral  SpO2: 90% 96% 94% 91%  Weight:  117.2 kg 117.2 kg   Height:  5' 11"  (1.803 m)      Intake/Output Summary (Last 24 hours) at 02/21/2020 0932 Last data filed at 02/21/2020 0830 Gross per 24 hour  Intake 420 ml  Output 1100 ml  Net -680 ml   Filed Weights   02/20/20 2224 02/21/20 0354  Weight: 117.2 kg 117.2 kg    Examination:  General exam: EOMI NCAT slight icterus no pallor Respiratory system: CTA B able to sit up clear  no added sound Cardiovascular system: S1-S2 no murmur Gastrointestinal system: Obese nondistended no rebound no guarding cannot appreciate organomegaly Central nervous system: Left Sleepy cannot appreciate asterixis Extremities: Grade 1 lower extremity edema Skin: As above Psychiatry: Euthymic pleasant  Data Reviewed: I have personally reviewed following labs and imaging studies Sodium 126-->128  BUNs/creatinine 16/1.2-->10/1.1 Albumin 2.5 AST 121/ALT 56-->100/44 Platelets 66-->53 Hemoglobin 12.8 White count 4.9  Radiology Studies: CT Head Wo Contrast  Result Date: 02/19/2020 CLINICAL DATA:  Cirrhosis, elevated ammonia level since COVID vaccine EXAM: CT HEAD WITHOUT CONTRAST TECHNIQUE: Contiguous axial images were obtained from the base of the skull through the vertex without intravenous contrast. COMPARISON:  CT 12/14/2019 FINDINGS: Brain: No evidence of acute infarction, hemorrhage, hydrocephalus, extra-axial collection, visible mass lesion or mass effect. Symmetric prominence of the ventricles, cisterns and sulci compatible with parenchymal volume loss. Senescent mineralization of the basal ganglia. Patchy areas of white matter hypoattenuation are most compatible with chronic microvascular angiopathy. Vascular: Atherosclerotic calcification of the carotid siphons. No hyperdense vessel. Skull: Chronic scarring/soft tissue infiltration in the left supraorbital scalp. Scattered benign punctate dermal calcifications. No acute scalp swelling or hematoma. No calvarial fracture or worrisome osseous lesion. Sinuses/Orbits: Paranasal sinuses and mastoid air cells are predominantly clear. Included orbital structures are unremarkable. Other: None IMPRESSION: 1. No acute intracranial abnormality. 2. Chronic microvascular angiopathy and parenchymal volume loss. 3. Stable scarring/soft tissue infiltration in the left supraorbital scalp. Electronically Signed   By: Lovena Le M.D.   On: 02/19/2020 22:29      Scheduled Meds: . lactulose  30 g Oral TID  . magnesium oxide  800 mg Oral BID  . pantoprazole  40 mg Oral BID  . sodium chloride flush  3 mL Intravenous Q12H   Continuous Infusions: . sodium chloride    . magnesium sulfate bolus IVPB       LOS: 1 day    Time spent: [place], MD [facility] To contact the attending provider between 7A-7P or the covering provider during after hours 7P-7A, please log into the web site [email or web address] and access using universal [place] password for that web site. If you do not have the password, please call the hospital operator.  02/21/2020, 9:32 AM

## 2020-02-21 NOTE — Progress Notes (Signed)
   02/21/20 0700  Assess: MEWS Score  Temp 98.1 F (36.7 C)  BP 114/73  Pulse Rate 71  ECG Heart Rate 70  Resp 16  Level of Consciousness Alert  SpO2 91 %  O2 Device Room Air  Assess: MEWS Score  MEWS Temp 0  MEWS Systolic 0  MEWS Pulse 0  MEWS RR 0  MEWS LOC 0  MEWS Score 0  MEWS Score Color Green  Assess: if the MEWS score is Yellow or Red  Were vital signs taken at a resting state? Yes  Focused Assessment No change from prior assessment  Early Detection of Sepsis Score *See Row Information* Low  MEWS guidelines implemented *See Row Information* No, other (Comment) (see note)  Treat  Pain Scale 0-10  Pain Score 0  Document  Progress note created (see row info) Yes    A full set of vitals was not initially charted triggering a yellow mews. Upon reassessment, patient remains a Green MEWS

## 2020-02-22 ENCOUNTER — Other Ambulatory Visit: Payer: Self-pay

## 2020-02-22 ENCOUNTER — Encounter (HOSPITAL_COMMUNITY): Payer: Self-pay | Admitting: Family Medicine

## 2020-02-22 LAB — CBC WITH DIFFERENTIAL/PLATELET
Abs Immature Granulocytes: 0.01 10*3/uL (ref 0.00–0.07)
Basophils Absolute: 0.1 10*3/uL (ref 0.0–0.1)
Basophils Relative: 1 %
Eosinophils Absolute: 0.2 10*3/uL (ref 0.0–0.5)
Eosinophils Relative: 4 %
HCT: 35.5 % — ABNORMAL LOW (ref 39.0–52.0)
Hemoglobin: 12.8 g/dL — ABNORMAL LOW (ref 13.0–17.0)
Immature Granulocytes: 0 %
Lymphocytes Relative: 28 %
Lymphs Abs: 1.4 10*3/uL (ref 0.7–4.0)
MCH: 34.6 pg — ABNORMAL HIGH (ref 26.0–34.0)
MCHC: 36.1 g/dL — ABNORMAL HIGH (ref 30.0–36.0)
MCV: 95.9 fL (ref 80.0–100.0)
Monocytes Absolute: 1 10*3/uL (ref 0.1–1.0)
Monocytes Relative: 19 %
Neutro Abs: 2.4 10*3/uL (ref 1.7–7.7)
Neutrophils Relative %: 48 %
Platelets: 57 10*3/uL — ABNORMAL LOW (ref 150–400)
RBC: 3.7 MIL/uL — ABNORMAL LOW (ref 4.22–5.81)
RDW: 15.5 % (ref 11.5–15.5)
WBC: 5 10*3/uL (ref 4.0–10.5)
nRBC: 0 % (ref 0.0–0.2)

## 2020-02-22 LAB — COMPREHENSIVE METABOLIC PANEL
ALT: 45 U/L — ABNORMAL HIGH (ref 0–44)
AST: 93 U/L — ABNORMAL HIGH (ref 15–41)
Albumin: 2.2 g/dL — ABNORMAL LOW (ref 3.5–5.0)
Alkaline Phosphatase: 71 U/L (ref 38–126)
Anion gap: 8 (ref 5–15)
BUN: 8 mg/dL (ref 8–23)
CO2: 21 mmol/L — ABNORMAL LOW (ref 22–32)
Calcium: 8.1 mg/dL — ABNORMAL LOW (ref 8.9–10.3)
Chloride: 100 mmol/L (ref 98–111)
Creatinine, Ser: 1.21 mg/dL (ref 0.61–1.24)
GFR, Estimated: >60 mL/min (ref >60)
Glucose, Bld: 136 mg/dL — ABNORMAL HIGH (ref 70–99)
Potassium: 4 mmol/L (ref 3.5–5.1)
Sodium: 129 mmol/L — ABNORMAL LOW (ref 135–145)
Total Bilirubin: 3.2 mg/dL — ABNORMAL HIGH (ref 0.3–1.2)
Total Protein: 6.4 g/dL — ABNORMAL LOW (ref 6.5–8.1)

## 2020-02-22 LAB — PROTIME-INR
INR: 1.7 — ABNORMAL HIGH (ref 0.8–1.2)
Prothrombin Time: 19.7 seconds — ABNORMAL HIGH (ref 11.4–15.2)

## 2020-02-22 LAB — MAGNESIUM: Magnesium: 1.8 mg/dL (ref 1.7–2.4)

## 2020-02-22 MED ORDER — LACTULOSE 10 GM/15ML PO SOLN: 30.0000 g | Freq: Three times a day (TID) | ORAL | 0 refills | Status: DC

## 2020-02-22 MED ORDER — MAGNESIUM OXIDE 400 (241.3 MG) MG PO TABS
800.0000 mg | ORAL_TABLET | Freq: Two times a day (BID) | ORAL | 0 refills | Status: DC
Start: 2020-02-22 — End: 2021-02-15

## 2020-02-22 NOTE — Progress Notes (Signed)
SATURATION QUALIFICATIONS: (This note is used to comply with regulatory documentation for home oxygen)  Patient Saturations on Room Air at Rest = 95%  Patient Saturations on Room Air while Ambulating = 92%  Patient Saturations on 0 Liters of oxygen while Ambulating = 92%  Please briefly explain why patient needs home oxygen:

## 2020-02-22 NOTE — Progress Notes (Signed)
D/C instructions given and reviewed. Stated his wife would review them when he got home. IV removed and tolerated well.

## 2020-02-22 NOTE — Plan of Care (Signed)

## 2020-02-22 NOTE — Plan of Care (Signed)
  Problem: Education: Goal: Knowledge of General Education information will improve Description: Including pain rating scale, medication(s)/side effects and non-pharmacologic comfort measures Outcome: Progressing   Problem: Activity: Goal: Risk for activity intolerance will decrease 02/22/2020 0602 by Barton Dubois, RN Outcome: Progressing 02/22/2020 0537 by Barton Dubois, RN Outcome: Progressing   Problem: Safety: Goal: Ability to remain free from injury will improve Outcome: Progressing

## 2020-02-22 NOTE — Plan of Care (Signed)
  Problem: Education: Goal: Knowledge of General Education information will improve Description Including pain rating scale, medication(s)/side effects and non-pharmacologic comfort measures Outcome: Progressing   Problem: Activity: Goal: Risk for activity intolerance will decrease Outcome: Progressing   Problem: Safety: Goal: Ability to remain free from injury will improve Outcome: Progressing   

## 2020-02-25 NOTE — Discharge Summary (Signed)
Physician Discharge Summary  Randall Frazier ZOX:[id number] DOB: 10-23-1955 DOA: 02/19/2020  PCP: [facility]  Admit date: 02/19/2020 Discharge date: 02/25/2020  Recommendations for Outpatient Follow-up:  1. Discharge to home. 2. Follow up with PCP in 7-10 days. Have chemistry and magnesium checked on that visit. 3. Take 30 gm lactulose three times a day. Hold one dose for more than 3 large bowel movements, then restart the next day. Goal is 2-3 large BM's daily.   Discharge Diagnoses: Principal diagnosis is #1 1. 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 AKI - resolved 3. Recent upper GI Bleed 4. Hypertension 5. 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 CAD s/p CABG 8. Hypomagnesemia 9. 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  Patient was discharged from a hospital in [place] 1 week ago after admission for hepatic encephalopathy.  Patient has been increasingly confused again over the past 2 days.  His wife reports that he has been disoriented, fell multiple times, and notes that the confusion is worsening.  Patient reports that he has not taken lactulose since the recent hospitalization, states that he does not need it.  He denies any abdominal pain, chest pain, headache, fever, chills, or neck stiffness.  ED  Course: Upon arrival to the ED, patient is found to be afebrile, saturating well on room air, bradycardic in the 50s, and with stable blood pressure.  EKG features sinus rhythm with nonspecific IVCD.  Noncontrast head CT is negative for acute intracranial abnormality.  Chemistry panel is notable for sodium of 126, albumin 2.5, AST 121, ALT 56, and total bilirubin 2.7.  Ammonia is elevated to 102.  CBC is notable for stable thrombocytopenia with platelets 66,000.  Lactic acid is mildly elevated and INR is 1.5.  Patient was treated with 1 mg IV Ativan and 30 g of oral lactulose in the ED.  Covid screening test not yet resulted.  Hospital Course:  64 year old community dwelling white male NAFLD followed at the [place] previously on spironolactone 25 twice daily Left sphenoid wing meningioma status post gamma knife surgery 07/14/2018 followed by Dr. Arlan Organ Dr. Vallarie Mare [facility] DM TY 2 Prior gout GI bleed with esophageal varices on EGD 12/16/2019 with moderate portal gastropathy and was followed by Dr. Michail Sermon etc. PUD HTN CAD status post CABG likely sometime in 2020  The patient was admitted to a telemetry bed. He was treated with lactulose 30 gm PO tid with a goal of 2-3 stools a day. On the day of discharge, the patient's sensorium had cleared and he had ambulated in halls with staff without difficulty. He was discharged to home on increased dose of lactulose.  Today's assessment: S: The patient is awake, alert, and oriented x 3. No acute distress. O: Vitals:  Vitals:   02/22/20 0422 02/22/20 1128  BP: 130/67 127/64  Pulse: 91 85  Resp: 18 18  Temp: 98.6 F (37 C) (!)  97.3 F (36.3 C)  SpO2: 94% 90%   Exam:  Constitutional:  . The patient is awake, alert, and oriented x 3. No acute distress. Respiratory:  . No increased work of breathing. . No wheezes, rales, or rhonchi . No tactile fremitus Cardiovascular:  . Regular rate and rhythm . No murmurs, ectopy, or gallups. . No lateral PMI. No  thrills. Abdomen:  . Abdomen is soft, non-tender, non-distended . No hernias, masses, or organomegaly . Normoactive bowel sounds.  Musculoskeletal:  . No cyanosis, clubbing, or edema Skin:  . No rashes, lesions, ulcers . palpation of skin: no induration or nodules Neurologic:  . CN 2-12 intact . Sensation all 4 extremities intact Psychiatric:  . Mental status o Mood, affect appropriate o Orientation to person, place, time  . judgment and insight appear intact   Discharge Instructions  Discharge Instructions    Call MD for:   Complete by: As directed    Confusion or lethargy.   Call MD for:  difficulty breathing, headache or visual disturbances   Complete by: As directed    Call MD for:  persistant nausea and vomiting   Complete by: As directed    Diet - low sodium heart healthy   Complete by: As directed    Discharge instructions   Complete by: As directed    Discharge to home. Follow up with PCP in 7-10 days. Have chemistry and magnesium checked on that visit. Take 30 gm lactulose three times a day. Hold one dose for more than 3 large bowel movements, then restart the next day. Goal is 2-3 large BM's daily.   Increase activity slowly   Complete by: As directed      Allergies as of 02/22/2020      Reactions   Aspirin Other (See Comments)   Pork-derived Products Other (See Comments)   Cannot eat- gout   Statins Other (See Comments)   Cramps   Trazodone And Nefazodone Nausea Only, Other (See Comments)   Caused patient to have a lowered level of tolerance, too   Tylenol [acetaminophen] Other (See Comments)   Lisinopril Rash   Niacin Rash   Niacin And Related Rash      Medication List    STOP taking these medications   metoprolol tartrate 50 MG tablet Commonly known as: LOPRESSOR   testosterone cypionate 200 MG/ML injection Commonly known as: DEPOTESTOSTERONE CYPIONATE   Xifaxan 550 MG Tabs tablet Generic drug: rifaximin     TAKE these medications    allopurinol 100 MG tablet Commonly known as: ZYLOPRIM Take 100 mg by mouth as needed (AS DIRECTED, FOR GOUT FLARES).   augmented betamethasone dipropionate 0.05 % cream Commonly known as: DIPROLENE-AF Apply 1 application topically 2 (two) times daily.   diphenhydrAMINE 25 mg capsule Commonly known as: BENADRYL Take 25 mg by mouth every 6 (six) hours as needed for itching.   fish oil-omega-3 fatty acids 1000 MG capsule Take 1,000 mg by mouth daily.   lactulose 10 GM/15ML solution Commonly known as: CHRONULAC Take 45 mLs (30 g total) by mouth 3 (three) times daily. Hold for more than 3 large BM's a day. What changed:   how much to take  when to take this  reasons to take this  additional instructions   loratadine 10 MG tablet Commonly known as: CLARITIN Take 10 mg by mouth daily as needed for allergies or rhinitis.   Lubricating Plus Eye Drops 0.5 % Soln Generic drug: Carboxymethylcellulose Sod PF Place 1 drop into  both eyes 3 (three) times daily as needed (for irritation).   magnesium oxide 400 (241.3 Mg) MG tablet Commonly known as: MAG-OX Take 2 tablets (800 mg total) by mouth 2 (two) times daily.   metFORMIN 1000 MG tablet Commonly known as: GLUCOPHAGE Take 1,000 mg by mouth 2 (two) times daily with a meal.   montelukast 10 MG tablet Commonly known as: SINGULAIR Take 10 mg by mouth at bedtime.   pantoprazole 40 MG tablet Commonly known as: PROTONIX Take 1 tablet (40 mg total) by mouth 2 (two) times daily.   spironolactone 25 MG tablet Commonly known as: ALDACTONE Take 25 mg by mouth daily.   THERAPEUTIC SHAMPOO 0.5 % shampoo Generic drug: coal tar Apply 1 application topically See admin instructions. Shampoo at least 4 times a week   Vitamin D3 50 MCG (2000 UT) Tabs Take 2,000 Units by mouth daily.   vitamin E 180 MG (400 UNITS) capsule Generic drug: vitamin E Take 400 Units by mouth daily.      Allergies  Allergen Reactions  . Aspirin Other  (See Comments)  . Pork-Derived Products Other (See Comments)    Cannot eat- gout  . Statins Other (See Comments)    Cramps   . Trazodone And Nefazodone Nausea Only and Other (See Comments)    Caused patient to have a lowered level of tolerance, too  . Tylenol [Acetaminophen] Other (See Comments)  . Lisinopril Rash  . Niacin Rash  . Niacin And Related Rash    The results of significant diagnostics from this hospitalization (including imaging, microbiology, ancillary and laboratory) are listed below for reference.    Significant Diagnostic Studies: CT Head Wo Contrast  Result Date: 02/19/2020 CLINICAL DATA:  Cirrhosis, elevated ammonia level since COVID vaccine EXAM: CT HEAD WITHOUT CONTRAST TECHNIQUE: Contiguous axial images were obtained from the base of the skull through the vertex without intravenous contrast. COMPARISON:  CT 12/14/2019 FINDINGS: Brain: No evidence of acute infarction, hemorrhage, hydrocephalus, extra-axial collection, visible mass lesion or mass effect. Symmetric prominence of the ventricles, cisterns and sulci compatible with parenchymal volume loss. Senescent mineralization of the basal ganglia. Patchy areas of white matter hypoattenuation are most compatible with chronic microvascular angiopathy. Vascular: Atherosclerotic calcification of the carotid siphons. No hyperdense vessel. Skull: Chronic scarring/soft tissue infiltration in the left supraorbital scalp. Scattered benign punctate dermal calcifications. No acute scalp swelling or hematoma. No calvarial fracture or worrisome osseous lesion. Sinuses/Orbits: Paranasal sinuses and mastoid air cells are predominantly clear. Included orbital structures are unremarkable. Other: None IMPRESSION: 1. No acute intracranial abnormality. 2. Chronic microvascular angiopathy and parenchymal volume loss. 3. Stable scarring/soft tissue infiltration in the left supraorbital scalp. Electronically Signed   By: Lovena Le M.D.   On:  02/19/2020 22:29    Microbiology: Recent Results (from the past 240 hour(s))  Respiratory Panel by RT PCR (Flu A&B, Covid) - Nasopharyngeal Swab     Status: None   Collection Time: 02/20/20 12:22 AM   Specimen: Nasopharyngeal Swab  Result Value Ref Range Status   SARS Coronavirus 2 by RT PCR NEGATIVE NEGATIVE Final    Comment: (NOTE) SARS-CoV-2 target nucleic acids are NOT DETECTED.  The SARS-CoV-2 RNA is generally detectable in upper respiratoy specimens during the acute phase of infection. The lowest concentration of SARS-CoV-2 viral copies this assay can detect is 131 copies/mL. A negative result does not preclude SARS-Cov-2 infection and should not be used as the sole basis for treatment or other patient management decisions. A negative result  may occur with  improper specimen collection/handling, submission of specimen other than nasopharyngeal swab, presence of viral mutation(s) within the areas targeted by this assay, and inadequate number of viral copies (<131 copies/mL). A negative result must be combined with clinical observations, patient history, and epidemiological information. The expected result is Negative.  Fact Sheet for Patients:  PinkCheek.be  Fact Sheet for Healthcare Providers:  GravelBags.it  This test is no t yet approved or cleared by the [place] FDA and  has been authorized for detection and/or diagnosis of SARS-CoV-2 by FDA under an Emergency Use Authorization (EUA). This EUA will remain  in effect (meaning this test can be used) for the duration of the COVID-19 declaration under Section 564(b)(1) of the Act, 21 U.S.C. section 360bbb-3(b)(1), unless the authorization is terminated or revoked sooner.     Influenza A by PCR NEGATIVE NEGATIVE Final   Influenza B by PCR NEGATIVE NEGATIVE Final    Comment: (NOTE) The Xpert Xpress SARS-CoV-2/FLU/RSV assay is intended as an aid in  the  diagnosis of influenza from Nasopharyngeal swab specimens and  should not be used as a sole basis for treatment. Nasal washings and  aspirates are unacceptable for Xpert Xpress SARS-CoV-2/FLU/RSV  testing.  Fact Sheet for Patients: PinkCheek.be  Fact Sheet for Healthcare Providers: GravelBags.it  This test is not yet approved or cleared by the [place] FDA and  has been authorized for detection and/or diagnosis of SARS-CoV-2 by  FDA under an Emergency Use Authorization (EUA). This EUA will remain  in effect (meaning this test can be used) for the duration of the  Covid-19 declaration under Section 564(b)(1) of the Act, 21  U.S.C. section 360bbb-3(b)(1), unless the authorization is  terminated or revoked. Performed at [facility], [address], [place], [place] [address]      Labs: Basic Metabolic Panel: Recent Labs  Lab 02/19/20 2048 02/20/20 0819 02/21/20 0601 02/22/20 0121  NA 126*  --  128* 129*  K 4.9  --  4.1 4.0  CL 97*  --  100 100  CO2 22  --  22 21*  GLUCOSE 85  --  95 136*  BUN 16  --  10 8  CREATININE 1.20  --  1.15 1.21  CALCIUM 8.3*  --  8.0* 8.1*  MG  --  1.5*  --  1.8   Liver Function Tests: Recent Labs  Lab 02/19/20 2048 02/21/20 0601 02/22/20 0121  AST 121* 100* 93*  ALT 56* 44 45*  ALKPHOS 101 64 71  BILITOT 2.7* 3.2* 3.2*  PROT 7.1 6.3* 6.4*  ALBUMIN 2.5* 2.2* 2.2*   No results for input(s): LIPASE, AMYLASE in the last 168 hours. Recent Labs  Lab 02/19/20 2049 02/20/20 1132  AMMONIA 102* 77*   CBC: Recent Labs  Lab 02/19/20 2048 02/21/20 0601 02/22/20 0121  WBC 4.2 4.9 5.0  NEUTROABS 2.0 2.6 2.4  HGB 13.6 12.8* 12.8*  HCT 38.5* 35.0* 35.5*  MCV 98.2 95.4 95.9  PLT 66* 53* 57*   Cardiac Enzymes: No results for input(s): CKTOTAL, CKMB, CKMBINDEX, TROPONINI in the last 168 hours. BNP: BNP (last 3 results) Recent Labs    12/15/19 0602  BNP 100.8*     ProBNP (last 3 results) No results for input(s): PROBNP in the last 8760 hours.  CBG: No results for input(s): GLUCAP in the last 168 hours.  Principal Problem:   Hepatic encephalopathy ([place]) Active Problems:   DM2 (diabetes mellitus, type 2) ([place])  Decompensated hepatic cirrhosis ([facility])   CAD (coronary artery disease)   Hyponatremia   Portal hypertension with esophageal varices ([facility])   Gastric ulcer   Time coordinating discharge: 38 minutes.  Signed:        Helia Haese, DO [facility]  02/25/2020, 10:08 AM

## 2020-05-09 DIAGNOSIS — Z01818 Encounter for other preprocedural examination: Secondary | ICD-10-CM | POA: Insufficient documentation

## 2021-02-15 ENCOUNTER — Emergency Department (HOSPITAL_COMMUNITY): Payer: No Typology Code available for payment source

## 2021-02-15 ENCOUNTER — Inpatient Hospital Stay (HOSPITAL_COMMUNITY)
Admission: EM | Admit: 2021-02-15 | Discharge: 2021-02-18 | DRG: 378 | Disposition: A | Discharge status: Home / Self Care | Payer: No Typology Code available for payment source | Attending: Family Medicine | Admitting: Family Medicine

## 2021-02-15 DIAGNOSIS — I8510 Secondary esophageal varices without bleeding: Secondary | ICD-10-CM | POA: Diagnosis present

## 2021-02-15 DIAGNOSIS — K31811 Angiodysplasia of stomach and duodenum with bleeding: Secondary | ICD-10-CM | POA: Diagnosis not present

## 2021-02-15 DIAGNOSIS — Z20822 Contact with and (suspected) exposure to covid-19: Secondary | ICD-10-CM | POA: Diagnosis present

## 2021-02-15 DIAGNOSIS — M109 Gout, unspecified: Secondary | ICD-10-CM | POA: Diagnosis present

## 2021-02-15 DIAGNOSIS — R791 Abnormal coagulation profile: Secondary | ICD-10-CM | POA: Diagnosis present

## 2021-02-15 DIAGNOSIS — F4310 Post-traumatic stress disorder, unspecified: Secondary | ICD-10-CM | POA: Diagnosis present

## 2021-02-15 DIAGNOSIS — E785 Hyperlipidemia, unspecified: Secondary | ICD-10-CM | POA: Diagnosis present

## 2021-02-15 DIAGNOSIS — Z951 Presence of aortocoronary bypass graft: Secondary | ICD-10-CM

## 2021-02-15 DIAGNOSIS — Z7682 Awaiting organ transplant status: Secondary | ICD-10-CM

## 2021-02-15 DIAGNOSIS — R52 Pain, unspecified: Secondary | ICD-10-CM | POA: Diagnosis not present

## 2021-02-15 DIAGNOSIS — R1011 Right upper quadrant pain: Secondary | ICD-10-CM | POA: Diagnosis not present

## 2021-02-15 DIAGNOSIS — R188 Other ascites: Secondary | ICD-10-CM | POA: Diagnosis not present

## 2021-02-15 DIAGNOSIS — I10 Essential (primary) hypertension: Secondary | ICD-10-CM | POA: Diagnosis present

## 2021-02-15 DIAGNOSIS — R040 Epistaxis: Secondary | ICD-10-CM | POA: Diagnosis present

## 2021-02-15 DIAGNOSIS — K920 Hematemesis: Secondary | ICD-10-CM | POA: Diagnosis not present

## 2021-02-15 DIAGNOSIS — E119 Type 2 diabetes mellitus without complications: Secondary | ICD-10-CM | POA: Diagnosis present

## 2021-02-15 DIAGNOSIS — K7581 Nonalcoholic steatohepatitis (NASH): Secondary | ICD-10-CM | POA: Diagnosis present

## 2021-02-15 DIAGNOSIS — K922 Gastrointestinal hemorrhage, unspecified: Secondary | ICD-10-CM | POA: Diagnosis present

## 2021-02-15 DIAGNOSIS — Z79899 Other long term (current) drug therapy: Secondary | ICD-10-CM

## 2021-02-15 DIAGNOSIS — Z981 Arthrodesis status: Secondary | ICD-10-CM

## 2021-02-15 DIAGNOSIS — Z7984 Long term (current) use of oral hypoglycemic drugs: Secondary | ICD-10-CM

## 2021-02-15 DIAGNOSIS — Z86011 Personal history of benign neoplasm of the brain: Secondary | ICD-10-CM

## 2021-02-15 DIAGNOSIS — I2510 Atherosclerotic heart disease of native coronary artery without angina pectoris: Secondary | ICD-10-CM | POA: Diagnosis present

## 2021-02-15 DIAGNOSIS — Z888 Allergy status to other drugs, medicaments and biological substances status: Secondary | ICD-10-CM

## 2021-02-15 DIAGNOSIS — K766 Portal hypertension: Secondary | ICD-10-CM | POA: Diagnosis present

## 2021-02-15 DIAGNOSIS — K7460 Unspecified cirrhosis of liver: Secondary | ICD-10-CM | POA: Diagnosis not present

## 2021-02-15 DIAGNOSIS — Z8711 Personal history of peptic ulcer disease: Secondary | ICD-10-CM

## 2021-02-15 DIAGNOSIS — K7469 Other cirrhosis of liver: Secondary | ICD-10-CM

## 2021-02-15 LAB — TYPE AND SCREEN
ABO/RH(D): A POS
Antibody Screen: NEGATIVE

## 2021-02-15 LAB — CBC WITH DIFFERENTIAL/PLATELET
Abs Immature Granulocytes: 0.02 K/uL (ref 0.00–0.07)
Basophils Absolute: 0 K/uL (ref 0.0–0.1)
Basophils Relative: 1 %
Eosinophils Absolute: 0.1 K/uL (ref 0.0–0.5)
Eosinophils Relative: 3 %
HCT: 36.5 % — ABNORMAL LOW (ref 39.0–52.0)
Hemoglobin: 12.5 g/dL — ABNORMAL LOW (ref 13.0–17.0)
Immature Granulocytes: 1 %
Lymphocytes Relative: 25 %
Lymphs Abs: 0.9 K/uL (ref 0.7–4.0)
MCH: 34.9 pg — ABNORMAL HIGH (ref 26.0–34.0)
MCHC: 34.2 g/dL (ref 30.0–36.0)
MCV: 102 fL — ABNORMAL HIGH (ref 80.0–100.0)
Monocytes Absolute: 0.5 K/uL (ref 0.1–1.0)
Monocytes Relative: 14 %
Neutro Abs: 2.1 K/uL (ref 1.7–7.7)
Neutrophils Relative %: 56 %
Platelets: 57 K/uL — ABNORMAL LOW (ref 150–400)
RBC: 3.58 MIL/uL — ABNORMAL LOW (ref 4.22–5.81)
RDW: 15.5 % (ref 11.5–15.5)
WBC: 3.6 K/uL — ABNORMAL LOW (ref 4.0–10.5)
nRBC: 0 % (ref 0.0–0.2)

## 2021-02-15 LAB — I-STAT CHEM 8, ED
BUN: 11 mg/dL (ref 8–23)
Calcium, Ion: 1.03 mmol/L — ABNORMAL LOW (ref 1.15–1.40)
Chloride: 106 mmol/L (ref 98–111)
Creatinine, Ser: 1 mg/dL (ref 0.61–1.24)
Glucose, Bld: 128 mg/dL — ABNORMAL HIGH (ref 70–99)
HCT: 36 % — ABNORMAL LOW (ref 39.0–52.0)
Hemoglobin: 12.2 g/dL — ABNORMAL LOW (ref 13.0–17.0)
Potassium: 4.5 mmol/L (ref 3.5–5.1)
Sodium: 138 mmol/L (ref 135–145)
TCO2: 23 mmol/L (ref 22–32)

## 2021-02-15 LAB — URINALYSIS, ROUTINE W REFLEX MICROSCOPIC
Bilirubin Urine: NEGATIVE
Glucose, UA: NEGATIVE mg/dL
Hgb urine dipstick: NEGATIVE
Ketones, ur: NEGATIVE mg/dL
Leukocytes,Ua: NEGATIVE
Nitrite: NEGATIVE
Protein, ur: NEGATIVE mg/dL
Specific Gravity, Urine: 1.036 — ABNORMAL HIGH (ref 1.005–1.030)
pH: 5 (ref 5.0–8.0)

## 2021-02-15 LAB — RESP PANEL BY RT-PCR (FLU A&B, COVID) ARPGX2
Influenza A by PCR: NEGATIVE
Influenza B by PCR: NEGATIVE
SARS Coronavirus 2 by RT PCR: NEGATIVE

## 2021-02-15 LAB — PROTIME-INR
INR: 2 — ABNORMAL HIGH (ref 0.8–1.2)
Prothrombin Time: 22.3 s — ABNORMAL HIGH (ref 11.4–15.2)

## 2021-02-15 LAB — COMPREHENSIVE METABOLIC PANEL
ALT: 42 U/L (ref 0–44)
AST: 125 U/L — ABNORMAL HIGH (ref 15–41)
Albumin: 2.3 g/dL — ABNORMAL LOW (ref 3.5–5.0)
Alkaline Phosphatase: 113 U/L (ref 38–126)
Anion gap: 7 (ref 5–15)
BUN: 10 mg/dL (ref 8–23)
CO2: 20 mmol/L — ABNORMAL LOW (ref 22–32)
Calcium: 8.2 mg/dL — ABNORMAL LOW (ref 8.9–10.3)
Chloride: 107 mmol/L (ref 98–111)
Creatinine, Ser: 0.96 mg/dL (ref 0.61–1.24)
GFR, Estimated: >60 mL/min (ref >60)
Glucose, Bld: 127 mg/dL — ABNORMAL HIGH (ref 70–99)
Potassium: 4.4 mmol/L (ref 3.5–5.1)
Sodium: 134 mmol/L — ABNORMAL LOW (ref 135–145)
Total Bilirubin: 3.9 mg/dL — ABNORMAL HIGH (ref 0.3–1.2)
Total Protein: 6.6 g/dL (ref 6.5–8.1)

## 2021-02-15 LAB — TROPONIN I (HIGH SENSITIVITY): Troponin I (High Sensitivity): 4 ng/L (ref <18)

## 2021-02-15 LAB — HIV ANTIBODY (ROUTINE TESTING W REFLEX): HIV Screen 4th Generation wRfx: NONREACTIVE

## 2021-02-15 LAB — LIPASE, BLOOD: Lipase: 34 U/L (ref 11–51)

## 2021-02-15 LAB — POC OCCULT BLOOD, ED: Fecal Occult Bld: POSITIVE — AB

## 2021-02-15 MED ORDER — SODIUM CHLORIDE 0.9 % IV SOLN
50.0000 ug/h | INTRAVENOUS | Status: DC
  Administered 2021-02-15 – 2021-02-17 (×5): 50 ug/h via INTRAVENOUS
  Filled 2021-02-15 (×6): qty 1

## 2021-02-15 MED ORDER — METFORMIN HCL 500 MG PO TABS
750.0000 mg | ORAL_TABLET | Freq: Two times a day (BID) | ORAL | Status: DC
  Administered 2021-02-18 (×2): 750 mg via ORAL
  Filled 2021-02-15 (×2): qty 2

## 2021-02-15 MED ORDER — SPIRONOLACTONE 25 MG PO TABS
25.0000 mg | ORAL_TABLET | Freq: Every day | ORAL | Status: DC
  Administered 2021-02-15 – 2021-02-18 (×4): 25 mg via ORAL
  Filled 2021-02-15 (×4): qty 1

## 2021-02-15 MED ORDER — RIFAXIMIN 550 MG PO TABS
550.0000 mg | ORAL_TABLET | Freq: Two times a day (BID) | ORAL | Status: DC
  Administered 2021-02-15 – 2021-02-18 (×6): 550 mg via ORAL
  Filled 2021-02-15 (×7): qty 1

## 2021-02-15 MED ORDER — POLYVINYL ALCOHOL 1.4 % OP SOLN: 1.0000 [drp] | OPHTHALMIC | Status: DC | PRN

## 2021-02-15 MED ORDER — IOHEXOL 350 MG/ML SOLN
100.0000 mL | Freq: Once | INTRAVENOUS | Status: AC | PRN
  Administered 2021-02-15: 100 mL via INTRAVENOUS

## 2021-02-15 MED ORDER — OCTREOTIDE LOAD VIA INFUSION
50.0000 ug | Freq: Once | INTRAVENOUS | Status: AC
  Administered 2021-02-15: 50 ug via INTRAVENOUS
  Filled 2021-02-15: qty 25

## 2021-02-15 MED ORDER — MONTELUKAST SODIUM 10 MG PO TABS
10.0000 mg | ORAL_TABLET | Freq: Every day | ORAL | Status: DC
  Administered 2021-02-16 – 2021-02-17 (×2): 10 mg via ORAL
  Filled 2021-02-15 (×4): qty 1

## 2021-02-15 MED ORDER — PANTOPRAZOLE SODIUM 40 MG IV SOLR: 40.0000 mg | Freq: Two times a day (BID) | INTRAVENOUS | Status: DC

## 2021-02-15 MED ORDER — PANTOPRAZOLE 80MG IVPB - SIMPLE MED
80.0000 mg | Freq: Once | INTRAVENOUS | Status: AC
  Administered 2021-02-15: 80 mg via INTRAVENOUS
  Filled 2021-02-15: qty 80

## 2021-02-15 MED ORDER — FUROSEMIDE 20 MG PO TABS
20.0000 mg | ORAL_TABLET | Freq: Every day | ORAL | Status: DC
  Administered 2021-02-15 – 2021-02-18 (×4): 20 mg via ORAL
  Filled 2021-02-15 (×4): qty 1

## 2021-02-15 MED ORDER — LACTULOSE 10 GM/15ML PO SOLN
30.0000 g | Freq: Three times a day (TID) | ORAL | Status: DC
  Administered 2021-02-15 – 2021-02-18 (×7): 30 g via ORAL
  Filled 2021-02-15 (×2): qty 45
  Filled 2021-02-15: qty 60
  Filled 2021-02-15 (×5): qty 45
  Filled 2021-02-15: qty 60

## 2021-02-15 MED ORDER — CARBOXYMETHYLCELLULOSE SOD PF 0.5 % OP SOLN: 1.0000 [drp] | Freq: Three times a day (TID) | OPHTHALMIC | Status: DC | PRN

## 2021-02-15 MED ORDER — SODIUM CHLORIDE 0.9 % IV SOLN
1.0000 g | Freq: Once | INTRAVENOUS | Status: AC
  Administered 2021-02-15: 1 g via INTRAVENOUS
  Filled 2021-02-15: qty 10

## 2021-02-15 MED ORDER — PANTOPRAZOLE INFUSION (NEW) - SIMPLE MED
8.0000 mg/h | INTRAVENOUS | Status: DC
  Administered 2021-02-15 – 2021-02-16 (×2): 8 mg/h via INTRAVENOUS
  Filled 2021-02-15 (×2): qty 80
  Filled 2021-02-15: qty 100

## 2021-02-15 MED ORDER — FENTANYL CITRATE PF 50 MCG/ML IJ SOSY
100.0000 ug | PREFILLED_SYRINGE | Freq: Once | INTRAMUSCULAR | Status: AC
  Administered 2021-02-15: 100 ug via INTRAVENOUS
  Filled 2021-02-15: qty 2

## 2021-02-15 NOTE — ED Provider Notes (Signed)
Patient handed off to me at 3 PM.  To admit for concern for GI bleed versus symptomatic cholelithiasis.  History of cirrhosis with varices.  Not on blood thinners.  Had abdominal pain today with multiple episodes of vomiting of blood.  On my evaluation pain has improved following pain medication.  Continues to have abnormal liver enzymes, INR, bilirubin.  Hemoglobin is 12.2.  Stool is grossly brown on exam.  CT scan shows cholelithiasis with distended gallbladder but no obvious acute cholecystitis.  We will get a right upper quadrant ultrasound to further evaluate.  He continues to have changes consistent with cirrhosis and does have some stigmata of portal venous hypertension including splenomegaly and mild ascites.  Gastroenterology has been consulted and will follow along.  Patient to be admitted to medicine.  He has been started on Protonix, octreotide, Rocephin.  Hemodynamically stable throughout my care.  This chart was dictated using voice recognition software.  Despite best efforts to proofread,  errors can occur which can change the documentation meaning.    Lennice Sites, DO 02/15/21 1604

## 2021-02-15 NOTE — ED Provider Notes (Signed)
[facility] EMERGENCY DEPARTMENT Provider Note   CSN: [id number] Arrival date & time: 02/15/21  1222     History No chief complaint on file.   Randall Frazier is a 65 y.o. male.  HPI     65 year old male with history of Karlene Lineman cirrhosis, hepatic encephalopathy, varices, on the [facility] liver transplant list, hypertension, hyperlipidemia, diabetes, PTSD, meningioma who presents with concern for hematemesis and abdominal pain.  Reports 2 hours ago developed acute onset RUQ pain. Severe, colicky, with radiation to the right back and right shoulder/thoracic back.  Has had nausea and vomiting.  When he vomited, the first time it was just blood, large amount of blood, without blood clots. Denies appearance of normal emesis with blood--just reports it appeared to be gross blood/only blood. The second time there was less blood, and the third time no blood. He has not had a BM since this happened, had not noticed black or bloody stools before. No diarrhea.  No dyspnea, no fevers.  Pain is severe, just can't get comfortable. Started after eating a banana and taking his medications.    Past Medical History:  Diagnosis Date   Arthritis    shoulder- both, neck, spine, GOUT   Diabetes mellitus    Gout    H/O exercise stress test    many yrs. 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 weight loss -over 18 mon. period     Patient Active Problem List   Diagnosis Date Noted   GI bleed 02/15/2021   Hepatic encephalopathy 02/19/2020   Portal hypertension with esophageal varices ([place]) 12/16/2019   Duodenal ulcer 12/16/2019   Gastric ulcer 12/16/2019   Gastritis 12/16/2019   DM2 (diabetes mellitus, type 2) ([place]) 12/15/2019   Decompensated hepatic cirrhosis ([place]) 12/15/2019   CAD (coronary artery disease) 12/15/2019   Hyperlipemia 12/15/2019   Essential hypertension 12/15/2019   Dyspnea 12/15/2019    Thrombocytopenia ([place]) 12/15/2019   AKI (acute kidney injury) ([place]) 12/15/2019   Hypoalbuminemia 12/15/2019   Acute hepatic encephalopathy 12/15/2019   Hyponatremia 12/15/2019   Cellulitis of right leg 12/15/2019   Melena 12/14/2019   Cervical spondylosis without myelopathy 12/20/2012    Class: Diagnosis of   Biliary dyskinesia 10/05/2012    Past Surgical History:  Procedure Laterality Date   ANTERIOR CERVICAL DECOMP/DISCECTOMY FUSION N/A 12/20/2012   Procedure: C5-6, C6-7 ANTERIOR CERVICAL DISCECTOMY AND FUSION, ALLOGRAFT, PLATE;  Surgeon: Marybelle Killings, MD;  Location: [facility];  Service: Orthopedics;  Laterality: N/A;   ESOPHAGOGASTRODUODENOSCOPY (EGD) WITH PROPOFOL Left 12/16/2019   Procedure: ESOPHAGOGASTRODUODENOSCOPY (EGD) WITH PROPOFOL;  Surgeon: Wilford Corner, MD;  Location: [facility];  Service: Endoscopy;  Laterality: Left;   KNEE SURGERY     osgood slaughter syndrome , 18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 Authorizing Provider  allopurinol (ZYLOPRIM) 100 MG tablet Take 100 mg by mouth as  needed (AS DIRECTED, FOR GOUT FLARES).    Yes [provider]  aspirin EC 81 MG tablet Take 81 mg by mouth daily. Swallow whole.   Yes [provider]  augmented betamethasone dipropionate (DIPROLENE-AF) 0.05 % cream Apply 1 application topically 2 (two) times daily. 02/01/20  Yes [provider]  Cholecalciferol (VITAMIN D3) 25 MCG (1000 UT) CAPS Take 1,000 Units by mouth daily.   Yes [provider]  diphenhydrAMINE (BENADRYL) 25 mg capsule Take 25 mg by mouth every 6 (six) hours as needed for  itching.    Yes [provider]  furosemide (LASIX) 20 MG tablet Take 20 mg by mouth daily. 12/12/20  Yes [provider]  lactulose (CHRONULAC) 10 GM/15ML solution Take 45 mLs (30 g total) by mouth 3 (three) times daily. Hold for more than 3 large BM's a day. 02/22/20  Yes Swayze, Ava, DO  loratadine (CLARITIN) 10 MG tablet Take 10 mg by mouth daily as needed for allergies or rhinitis.   Yes [provider]  LUBRICATING PLUS EYE DROPS 0.5 % SOLN Place 1 drop into both eyes 3 (three) times daily as needed (for irritation).  10/05/19  Yes [provider]  metFORMIN (GLUCOPHAGE) 500 MG tablet Take 750 mg by mouth 2 (two) times daily with a meal. Taking 1 & 1/2 tabs = 750 mg twice daily   Yes [provider]  metoprolol tartrate (LOPRESSOR) 50 MG tablet Take 50 mg by mouth 2 (two) times daily. 09/04/20  Yes [provider]  montelukast (SINGULAIR) 10 MG tablet Take 10 mg by mouth at bedtime.   Yes [provider]  pantoprazole (PROTONIX) 40 MG tablet Take 40 mg by mouth daily. 12/19/19  Yes [provider]  spironolactone (ALDACTONE) 25 MG tablet Take 25 mg by mouth daily.    Yes [provider]  THERAPEUTIC SHAMPOO 0.5 % shampoo Apply 1 application topically See admin instructions. Shampoo at least 4 times a week 10/05/19  Yes [provider]  vitamin E 180 MG (400 UNITS) capsule Take 400 Units by mouth 2 (two) times daily.   Yes [provider]  XIFAXAN 550 MG TABS tablet Take 550 mg by mouth 2 (two) times daily. 12/12/20  Yes [provider]    Allergies    Aspirin, Meloxicam, Pork-derived products, Statins, Trazodone and nefazodone, Tylenol [acetaminophen], Lisinopril, Niacin, and Niacin and related  Review of Systems   Review of Systems  Constitutional:  Positive for fatigue. Negative for fever.  HENT:  Negative for sore throat.   Eyes:  Negative for visual disturbance.  Respiratory:  Negative  for shortness of breath.   Cardiovascular:  Negative for chest pain.  Gastrointestinal:  Positive for abdominal pain, nausea and vomiting.  Genitourinary:  Negative for difficulty urinating.  Musculoskeletal:  Negative for back pain and neck stiffness.  Skin:  Negative for rash.  Neurological:  Negative for syncope and headaches.   Physical Exam Updated Vital Signs BP (!) 117/50   Pulse 70   Temp 97.6 F (36.4 C) (Oral)   Resp 14   SpO2 95%   Physical Exam Vitals and nursing note reviewed.  Constitutional:      General: He is not in acute distress.    Appearance: He is well-developed. He is ill-appearing. He is not diaphoretic.     Comments: Uncomfortable, rocking and shifting around with pain  HENT:     Head: Normocephalic and atraumatic.  Eyes:     Conjunctiva/sclera: Conjunctivae  normal.  Cardiovascular:     Rate and Rhythm: Normal rate and regular rhythm.     Heart sounds: Normal heart sounds. No murmur heard.   No friction rub. No gallop.  Pulmonary:     Effort: Pulmonary effort is normal. No respiratory distress.     Breath sounds: Normal breath sounds. No wheezing or rales.  Abdominal:     General: There is no distension.     Palpations: Abdomen is soft.     Tenderness: There is abdominal tenderness. There is right CVA tenderness. There is no guarding.  Musculoskeletal:     Cervical back: Normal range of motion.  Skin:    General: Skin is warm and dry.  Neurological:     Mental Status: He is alert and oriented to person, place, and time.    ED Results / Procedures / Treatments   Labs (all labs ordered are listed, but only abnormal results are displayed) Labs Reviewed  PROTIME-INR - Abnormal; Notable for the following components:      Result Value   Prothrombin Time 22.3 (*)    INR 2.0 (*)    All other components within normal limits  CBC WITH DIFFERENTIAL/PLATELET - Abnormal; Notable for the following components:   WBC 3.6 (*)    RBC 3.58 (*)     Hemoglobin 12.5 (*)    HCT 36.5 (*)    MCV 102.0 (*)    MCH 34.9 (*)    Platelets 57 (*)    All other components within normal limits  COMPREHENSIVE METABOLIC PANEL - Abnormal; Notable for the following components:   Sodium 134 (*)    CO2 20 (*)    Glucose, Bld 127 (*)    Calcium 8.2 (*)    Albumin 2.3 (*)    AST 125 (*)    Total Bilirubin 3.9 (*)    All other components within normal limits  URINALYSIS, ROUTINE W REFLEX MICROSCOPIC - Abnormal; Notable for the following components:   Color, Urine AMBER (*)    Specific Gravity, Urine 1.036 (*)    All other components within normal limits  I-STAT CHEM 8, ED - Abnormal; Notable for the following components:   Glucose, Bld 128 (*)    Calcium, Ion 1.03 (*)    Hemoglobin 12.2 (*)    HCT 36.0 (*)    All other components within normal limits  POC OCCULT BLOOD, ED - Abnormal; Notable for the following components:   Fecal Occult Bld POSITIVE (*)    All other components within normal limits  RESP PANEL BY RT-PCR (FLU A&B, COVID) ARPGX2  LIPASE, BLOOD  HIV ANTIBODY (ROUTINE TESTING W REFLEX)  COMPREHENSIVE METABOLIC PANEL  CBC  POC OCCULT BLOOD, ED  TYPE AND SCREEN  TROPONIN I (HIGH SENSITIVITY)  TROPONIN I (HIGH SENSITIVITY)    EKG EKG Interpretation  Date/Time:  Friday February 15 2021 14:06:44 EDT Ventricular Rate:  67 PR Interval:  212 QRS Duration: 110 QT Interval:  460 QTC Calculation: 486 R Axis:   9 Text Interpretation: Sinus rhythm Borderline prolonged PR interval Nonspecific T abnormalities, anterior leads Borderline prolonged QT interval Confirmed by Lennice Sites ([id number]) on 02/15/2021 3:14:46 PM  Radiology CT Angio Chest/Abd/Pel for Dissection W and/or W/WO  Result Date: 02/15/2021 CLINICAL DATA:  Abdominal pain, suspected aortic dissection, right upper quadrant pain, vomiting and hematemesis EXAM: CT ANGIOGRAPHY CHEST, ABDOMEN AND PELVIS TECHNIQUE: Multidetector CT imaging through the chest, abdomen and pelvis was  performed using the standard protocol during bolus administration  of intravenous contrast. Multiplanar reconstructed images and MIPs were obtained and reviewed to evaluate the vascular anatomy. CONTRAST:  154m OMNIPAQUE IOHEXOL 350 MG/ML SOLN COMPARISON:  None available FINDINGS: CTA CHEST FINDINGS Cardiovascular: Cardiomegaly with mild biatrial enlargement left worse than right. No pericardial effusion. The RV is nondilated. Satisfactory opacification of pulmonary arteries noted, and there is no evidence of pulmonary emboli. 3 vessel coronary calcifications, post LIMA CABG. Adequate contrast opacification of the thoracic aorta with no evidence of dissection, aneurysm, or stenosis. There is classic 3-vessel brachiocephalic arch anatomy without proximal stenosis. Mild scattered plaque in the arch and descending thoracic aorta. Mediastinum/Nodes: No mass or adenopathy. Limited evaluation for esophageal varices on this arterial phase exam. Lungs/Pleura: Trace right pleural effusion. No pneumothorax. Subcentimeter calcified granuloma in the posterior left upper lobe. Musculoskeletal: Cervical fixation hardware partially visualized. Sternotomy wires. Review of the MIP images confirms the above findings. CTA ABDOMEN AND PELVIS FINDINGS VASCULAR Aorta: Mild calcified plaque.  No aneurysm, dissection, or stenosis. Celiac: Nonocclusive calcified plaque at the origin, patent distally. SMA: Patent without evidence of aneurysm, dissection, vasculitis or significant stenosis. Renals: Single left, with mild atheromatous plaque, no stenosis. Duplicated right, inferior dominant, both patent. IMA: Patent without evidence of aneurysm, dissection, vasculitis or significant stenosis. Inflow: Patent without evidence of aneurysm, dissection, vasculitis or significant stenosis. Veins: Left splenorenal shunt. Small venous collaterals at the umbilicus. Limited assessment for gastric varices given the arterial phase timing of contrast.  Review of the MIP images confirms the above findings. NON-VASCULAR Hepatobiliary: Gallbladder is distended with heterogenous wall, partially calcified stones in its dependent aspect. Nodular small liver without focal lesion or biliary ductal dilatation. Pancreas: Unremarkable. No pancreatic ductal dilatation or surrounding inflammatory changes. Spleen: Moderate splenomegaly without evident lesion. Adrenals/Urinary Tract: Adrenal glands are unremarkable. Kidneys are normal, without renal calculi, focal lesion, or hydronephrosis. Bladder is unremarkable. Stomach/Bowel: The stomach is incompletely distended. Small bowel decompressed. Appendix not discretely identified. The colon is nondilated, unremarkable. Lymphatic: Increased number of subcentimeter retroperitoneal and central mesenteric/epigastric lymph nodes. Reproductive: Prostate normal in size containing coarse calcifications. Other: Trace pelvic, perihepatic and perisplenic ascites. No ascites. Musculoskeletal: Facet and disc degenerative changes L5-S1. No fracture or worrisome bone lesion. Review of the MIP images confirms the above findings. IMPRESSION: 1. Negative for aortic dissection. 2. Cholelithiasis with distended gallbladder. 3. Nodular small liver suggesting cirrhosis, with stigmata of portal venous hypertension including splenomegaly, ascites, and dilated portosystemic collateral venous pathways. Electronically Signed   By: DLucrezia EuropeM.D.   On: 02/15/2021 15:25   [place]Abdomen Limited RUQ (LIVER/GB)  Result Date: 02/15/2021 CLINICAL DATA:  Right upper quadrant pain. EXAM: ULTRASOUND ABDOMEN LIMITED RIGHT UPPER QUADRANT COMPARISON:  CT abdomen pelvis 02/15/2021. FINDINGS: Gallbladder: No gallstones or wall thickening visualized. No sonographic Murphy sign noted by sonographer. Common bile duct: Diameter: 6 mm Liver: Left hepatic lobe not visualized. Nodular hepatic contour. Markedly limited evaluation no focal lesion identified. Within normal limits  in parenchymal echogenicity. Portal vein is patent on color Doppler imaging with normal direction of blood flow towards the liver. Other: At least trace volume simple free fluid ascites. . IMPRESSION: 1. Cirrhosis with known portal hypertension. Limited evaluation for focal hepatic lesion. Recommend nonemergent MRI liver protocol for further evaluation. When the patient is clinically stable and able to follow directions and hold their breath (preferably as an outpatient) further evaluation with dedicated abdominal MRI should be considered. 2. Left hepatic lobe not visualized due to bowel gas. 3. At least trace volume simple  free fluid ascites. Electronically Signed   By: Iven Finn M.D.   On: 02/15/2021 17:20    Procedures .Critical Care Performed by: Gareth Morgan, MD Authorized by: Gareth Morgan, MD   Critical care provider statement:    Critical care time (minutes):  30   Critical care was time spent personally by me on the following activities:  Discussions with consultants, examination of patient, ordering and performing treatments and interventions, ordering and review of laboratory studies, ordering and review of radiographic studies, pulse oximetry, obtaining history from patient or surrogate and review of old charts   Medications Ordered in ED Medications  octreotide (SANDOSTATIN) 2 mcg/mL load via infusion 50 mcg (50 mcg Intravenous Bolus from Bag 02/15/21 1620)    And  octreotide (SANDOSTATIN) 500 mcg in sodium chloride 0.9 % 250 mL (2 mcg/mL) infusion (50 mcg/hr Intravenous New Bag/Given 02/15/21 1556)  pantoprozole (PROTONIX) 80 mg /NS 100 mL infusion (8 mg/hr Intravenous New Bag/Given 02/15/21 1952)  pantoprazole (PROTONIX) injection 40 mg (has no administration in time range)  rifaximin (XIFAXAN) tablet 550 mg (has no administration in time range)  furosemide (LASIX) tablet 20 mg (20 mg Oral Given 02/15/21 1849)  spironolactone (ALDACTONE) tablet 25 mg (25 mg Oral Given  02/15/21 1850)  metFORMIN (GLUCOPHAGE) tablet 750 mg (has no administration in time range)  lactulose (CHRONULAC) 10 GM/15ML solution 30 g (30 g Oral Given 02/15/21 1850)  montelukast (SINGULAIR) tablet 10 mg (has no administration in time range)  polyvinyl alcohol (LIQUIFILM TEARS) 1.4 % ophthalmic solution 1 drop (has no administration in time range)  fentaNYL (SUBLIMAZE) injection 100 mcg (100 mcg Intravenous Given 02/15/21 1401)  pantoprazole (PROTONIX) 80 mg /NS 100 mL IVPB (0 mg Intravenous Stopped 02/15/21 1550)  cefTRIAXone (ROCEPHIN) 1 g in sodium chloride 0.9 % 100 mL IVPB (0 g Intravenous Stopped 02/15/21 1943)  iohexol (OMNIPAQUE) 350 MG/ML injection 100 mL (100 mLs Intravenous Contrast Given 02/15/21 1455)    ED Course  I have reviewed the triage vital signs and the nursing notes.  Pertinent labs & imaging results that were available during my care of the patient were reviewed by me and considered in my medical decision making (see chart for details).    MDM Rules/Calculators/A&P                            65 year old male with history of Karlene Lineman cirrhosis, hepatic encephalopathy, varices, on the [facility] liver transplant list, hypertension, hyperlipidemia, diabetes, PTSD, meningioma who presents with concern for hematemesis and abdominal pain.  DDx includes dissection, nephrolithiasis, cholecystitis, choledocolithiasis, cholelithiasis, ACS, perforation, PUD, bleeding ulcers, bleeding varices.  Abdominal pain description consistent with cholelithiasis however does not have clear or consistent RUQ abdominal pain on exam,  CT dissection study ordered to evaluate for signs of active bleeding, dissection, and evaluate for other signs of intraabdominal pain/back pain.  Describes significant amount of gross blood in initial episode of emesis. Given history of varices, concern for possible UGI bleed, ordered type and screen/octreotide/protonix.  Care signed out to Dr. Ronnald Nian with CT pending.  Discussed with [facility] who will evaluate the patient.  Final Clinical Impression(s) / ED Diagnoses Final diagnoses:  Pain  Hematemesis with nausea  Right upper quadrant abdominal pain    Rx / DC Orders ED Discharge Orders     None        Gareth Morgan, MD 02/15/21 2207

## 2021-02-15 NOTE — H&P (View-Only) (Signed)
Referring Provider:  [facility]         Primary Care Physician:  Alfred Primary Gastroenterologist:  Followed by Rob Hickman and [facility] in [place], [place]  Reason for Consultation:  hematemesis                 ASSESSMENT / PLAN   # 65 yo male with acute RUQ pain and hematemesis in setting of decompensated NASH cirrhosis. On transplant list at [facility] in [place], [place] . Also followed by [facility] Hepatology but MELD hasn't been high enough for listing. Records reviewed in Care Where. He has history of small esophageal varices on last surveillance EGD in September 2021. He also has a history of gastric and duodenal ulcers in 2021.  He is hemodynamically stable. Normal BUN. No melena. Hgb 12.2 ( baseline is ~13). Low suspicion for variceal bleed. Hematemesis possibly related to epistaxis. Bleeding from PUD possible but seems less likely.  --No plans for EGD at this point. Monitor closely --For now, for precautionary measures recommend continuation of PPI infusion, Octreotide, and Rocephin ( SBP prophylaxis / ascites on imaging) --No evidence for hepatic encephalopathy. Continue home lactulose and xifaxan --Renal function is normal. He has peripheral edema. Hopefully get him back on diuretics soon.  --Clear liquids okay, will make NPO after MN. Dr. Benson Norway / Dr. Collene Mares covering for our service this weekend. Depending on clinical course ( if ongoing bleeding) then may need EGD tomorrow but will see how he does overnight.  --INR is 2.  --Has ascites but is afebrile without leukocytosis. No significant abdominal tenderness.    # Severe RUQ pain this am. Pain radiating around to back and into shoulder. Etiology unclear. He has known cholelithiasis but no evidence for cholecystitis on CT angio earlier today and not tender in RUQ. Getting RUQ [place] later today. AST mildly elevated but alk phos is normal. Bilirubin is 3.9 which is close to what is was in April 2022. at [facility] --Pain  relieved with pain meds. Await [place] results   HISTORY OF PRESENT ILLNESS                                                                                                                         Chief Complaint:  vomiting blood  Randall Frazier is a 65 y.o. male with a past medical history significant for  NASH cirrhosis, duodenal ulcers. HLD, DM, PTSD, CAD s/p CABG, hx of sphnoid wing meningioma s/p gamma knife. See PMH for any additional medical history.   Patient has NASH cirrhosis complicated by portal HTN with history of ascites, hepatic encephalopathy and esophageal varices. He is followed at [facility] but MELD score hasn't been high enough to be listed for transplant. He is on transplant list with [facility].   Reviewed notes in Care Everywhere but unable to locate endoscopy reports. Notes from [facility] liver transplant hepatologist state opatient had variceal surveillance EGD on 02/03/20 with findings of small esophageal varices  and 2 duodenal ulcers.  Last follow up with [facility] was 11/13/20 with Dr. Mindi Junker. He was haivng increased swelling in legs at that time but weight was down. Still wasn't candidate for transplant listing, MELD was 20.  Diuretic continued ( 33m spironolactone daily and 239mof furosemide daily). Treatment for depression was recommended.   ED course:  Hemodynamically stable.  Transient hypoxia, now resolved.  No leukocytosis.  Hemoglobin 12.5 (close to baseline).  Platelets 57, INR 2. CTA ruled out dissection  Patient developed acute right upper quadrant pain this morning.  The pain wrapped around his abdomen into his shoulder.  He subsequently had nausea and vomited what he describes as a large amount of bright red blood.  After vomiting he began having bleeding from right nare. He has had epistaxis from left nare for years . He vomited a second time and emesis contained a smaller amount of red blood.  He has not had any rectal bleeding/melena.  He has never experienced  pain like this before in the right upper quadrant.  No NSAID use other than daily baby asa. He takes daily protonix. He is having 4-5 bowel movements a day on lactulose. He is on low dose diuretics at home. He has lost ~ 50 pounds due to dietary changes. He does complain of swelling of lower extremities.  No nausea/vomiting since 11 AM today.    SIGNIFICANT DIAGNOTIC STUDIES   05/07/20 [facility]  [facility] Impression:  Hepatic findings:  Cirrhotic liver morphology with sequela of portal hypertension including  splenomegaly and small upper abdominal/paraesophageal varices. Hypoenhancing 0.7 cm lesion in the inferior right hepatic lobe favoring to  be a benign hepatic cyst. LR-2.   Extrahepatic findings:  Cholelithiasis without findings of acute cholecystitis.   Findings outside of the Hepatobiliary system:    Top normal heart size.  Partially visualized marked coronary calcifications.  Partially visualized median sternotomy wires.  Small fat and vessel-containing umbilical hernia.  Moderate atherosclerotic calcifications of the abdominal vasculature.  Degenerative changes of the spine.  Duplicated right renal arteries.  Top normal retroperitoneal lymph nodes, favored reactive.   Past Medical History:  Diagnosis Date   Arthritis    shoulder- both, neck, spine, GOUT   Diabetes mellitus    Gout    H/O exercise stress test    many yrs. 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 weight loss -over 18 mon. 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 Authorizing Provider  allopurinol (ZYLOPRIM) 100 MG tablet Take 100 mg by mouth as needed (AS DIRECTED, FOR GOUT FLARES).    Yes [provider]  augmented betamethasone dipropionate (DIPROLENE-AF) 0.05 % cream Apply 1 application topically 2 (two) times daily. 02/01/20  Yes [provider]  Cholecalciferol (VITAMIN D3) 50 MCG (2000 UT) TABS Take 2,000 Units by mouth daily.   Yes [provider]  diphenhydrAMINE (BENADRYL) 25 mg capsule Take 25 mg by mouth every 6 (six) hours as needed for itching.    Yes [provider]  lactulose (CHRONULAC) 10 GM/15ML solution Take 45 mLs (30 g total) by mouth 3 (three) times daily. Hold for more than 3 large BM's a day. 02/22/20  Yes Swayze, Ava, DO  loratadine (CLARITIN) 10 MG tablet Take 10 mg by mouth daily as needed for allergies or rhinitis.   Yes [provider]  LUBRICATING PLUS EYE DROPS 0.5 % SOLN Place 1 drop into both eyes 3 (three) times daily as needed (for irritation).  10/05/19  Yes [provider]  metFORMIN (GLUCOPHAGE) 1000 MG tablet Take 1,000 mg by mouth 2 (two) times daily with a meal.   Yes [provider]  pantoprazole (PROTONIX) 40 MG tablet Take 40 mg by mouth 2 (two) times daily before a meal. 12/19/19  Yes [provider]  THERAPEUTIC SHAMPOO 0.5 % shampoo Apply 1 application topically See admin instructions. Shampoo at least 4 times a week 10/05/19  Yes [provider]  vitamin E 180 MG (400 UNITS) capsule Take 400 Units by mouth daily.   Yes [provider]  magnesium oxide (MAG-OX) 400 (241.3 Mg) MG tablet Take 2 tablets (800 mg total) by mouth 2 (two) times daily.  02/22/20   Swayze, Ava, DO  montelukast (SINGULAIR) 10 MG tablet Take 10 mg by mouth at bedtime.    [provider]  spironolactone (ALDACTONE) 25 MG tablet Take 25 mg by mouth daily.     [provider]  XIFAXAN 550 MG TABS tablet Take 550 mg by mouth 2 (two) times daily. 12/12/20   [provider]    Current Facility-Administered Medications  Medication Dose Route Frequency Provider Last Rate Last Admin   cefTRIAXone (ROCEPHIN) 1 g in sodium chloride 0.9 % 100 mL IVPB  1 g Intravenous Once Gareth Morgan, MD       octreotide (SANDOSTATIN) 2 mcg/mL load via infusion 50 mcg  50 mcg Intravenous Once Gareth Morgan, MD       And   octreotide (SANDOSTATIN) 500 mcg in sodium chloride 0.9 % 250 mL (2 mcg/mL) infusion  50 mcg/hr Intravenous Continuous Gareth Morgan, MD       Derrill Memo ON 02/19/2021] pantoprazole (PROTONIX) injection 40 mg  40 mg Intravenous Q12H Gareth Morgan, MD       pantoprozole (PROTONIX) 80 mg /NS 100 mL infusion  8 mg/hr Intravenous Continuous Gareth Morgan, MD       Current Outpatient Medications  Medication Sig Dispense Refill   allopurinol (ZYLOPRIM) 100 MG tablet Take 100 mg by mouth as needed (AS DIRECTED, FOR GOUT FLARES).      augmented betamethasone dipropionate (DIPROLENE-AF) 0.05 % cream Apply 1 application topically 2 (two) times daily.     Cholecalciferol (VITAMIN D3) 50 MCG (2000 UT) TABS Take 2,000 Units by mouth daily.     diphenhydrAMINE (BENADRYL) 25 mg capsule Take 25 mg by mouth every 6 (six) hours as needed for itching.      lactulose (CHRONULAC) 10 GM/15ML solution Take 45 mLs (30 g total) by mouth 3 (three) times daily. Hold for more than 3 large BM's a day. 236 mL 0   loratadine (CLARITIN) 10 MG tablet Take 10 mg by mouth daily as needed for allergies or rhinitis.  LUBRICATING PLUS EYE DROPS 0.5 % SOLN Place 1 drop into both eyes 3 (three) times daily as needed (for irritation).      metFORMIN (GLUCOPHAGE) 1000 MG  tablet Take 1,000 mg by mouth 2 (two) times daily with a meal.     pantoprazole (PROTONIX) 40 MG tablet Take 40 mg by mouth 2 (two) times daily before a meal.     THERAPEUTIC SHAMPOO 0.5 % shampoo Apply 1 application topically See admin instructions. Shampoo at least 4 times a week     vitamin E 180 MG (400 UNITS) capsule Take 400 Units by mouth daily.     magnesium oxide (MAG-OX) 400 (241.3 Mg) MG tablet Take 2 tablets (800 mg total) by mouth 2 (two) times daily. 60 tablet 0   montelukast (SINGULAIR) 10 MG tablet Take 10 mg by mouth at bedtime.     spironolactone (ALDACTONE) 25 MG tablet Take 25 mg by mouth daily.      XIFAXAN 550 MG TABS tablet Take 550 mg by mouth 2 (two) times daily.      Allergies as of 02/15/2021 - Review Complete 02/15/2021  Allergen Reaction Noted   Aspirin Other (See Comments) 02/20/2020   Meloxicam Other (See Comments) 02/15/2021   Pork-derived products Other (See Comments) 12/14/2019   Statins Other (See Comments) 03/06/2015   Trazodone and nefazodone Nausea Only and Other (See Comments) 12/14/2019   Tylenol [acetaminophen] Other (See Comments) 02/20/2020   Lisinopril Rash 07/27/2012   Niacin Rash 04/24/2015   Niacin and related Rash 10/26/2011    Family History  Problem Relation Age of Onset   Liver disease Sister    Diabetes Neg Hx     Social History   Socioeconomic History   Marital status: Married    Spouse name: Not on file   Number of children: Not on file   Years of education: Not on file   Highest education level: Not on file  Occupational History   Not on file  Tobacco Use   Smoking status: Never   Smokeless tobacco: Never  Substance and Sexual Activity   Alcohol use: Yes    Comment: 2 beers / year- very rare   Drug use: No   Sexual activity: Not on file  Other Topics Concern   Not on file  Social History Narrative   Not on file   Social Determinants of Health   Financial Resource Strain: Not on file  Food Insecurity: Not on  file  Transportation Needs: Not on file  Physical Activity: Not on file  Stress: Not on file  Social Connections: Not on file  Intimate Partner Violence: Not on file    Review of Systems: RLE chronic itching. Chronic epistaxis. All systems reviewed and negative except where noted in HPI.   OBJECTIVE    Physical Exam: Vital signs in last 24 hours: Temp:  [97.6 F (36.4 C)] 97.6 F (36.4 C) (10/07 1229) Pulse Rate:  [59-65] 59 (10/07 1324) Resp:  [9-15] 15 (10/07 1300) BP: (140-148)/(73-86) 140/73 (10/07 1245) SpO2:  [87 %-94 %] 87 % (10/07 1324)   General:   Alert  male in NAD Psych:  Pleasant, cooperative. Normal mood and affect. Eyes:  Pupils equal, sclera clear, no icterus.   Conjunctiva pink. Ears:  Normal auditory acuity. Nose:  No deformity, discharge,  or lesions. Neck:  Supple; no masses Lungs:  Clear throughout to auscultation.   No wheezes, crackles, or rhonchi.  Heart:  Regular rate and rhythm;  3+BLE edema Abdomen:  Soft, mildly distended. Mid lower abdominal tenderness.  NO RUQ tenderness. BS active, Rectal:  Deferred. Light brown stool on exam by EDP per RN. Hemoccults pending Msk:  Symmetrical without gross deformities. . Neurologic:  Alert and  oriented x4;  grossly normal neurologically. No asterixis   Scheduled inpatient medications  octreotide  50 mcg Intravenous Once   [START ON 02/19/2021] pantoprazole  40 mg Intravenous Q12H      Intake/Output from previous day: No intake/output data recorded. Intake/Output this shift: No intake/output data recorded.   Lab Results: Recent Labs    02/15/21 1310 02/15/21 1317  WBC 3.6*  --   HGB 12.5* 12.2*  HCT 36.5* 36.0*  PLT 57*  --    BMET Recent Labs    02/15/21 1310 02/15/21 1317  NA 134* 138  K 4.4 4.5  CL 107 106  CO2 20*  --   GLUCOSE 127* 128*  BUN 10 11  CREATININE 0.96 1.00  CALCIUM 8.2*  --    LFT Recent Labs    02/15/21 1310  PROT 6.6  ALBUMIN 2.3*  AST 125*  ALT 42   ALKPHOS 113  BILITOT 3.9*   PT/INR Recent Labs    02/15/21 1310  LABPROT 22.3*  INR 2.0*   Hepatitis Panel No results for input(s): HEPBSAG, HCVAB, HEPAIGM, HEPBIGM in the last 72 hours.   . CBC Latest Ref Rng & Units 02/15/2021 02/15/2021 02/22/2020  WBC 4.0 - 10.5 K/uL - 3.6(L) 5.0  Hemoglobin 13.0 - 17.0 g/dL 12.2(L) 12.5(L) 12.8(L)  Hematocrit 39.0 - 52.0 % 36.0(L) 36.5(L) 35.5(L)  Platelets 150 - 400 K/uL - 57(L) 57(L)    . CMP Latest Ref Rng & Units 02/15/2021 02/15/2021 02/22/2020  Glucose 70 - 99 mg/dL 128(H) 127(H) 136(H)  BUN 8 - 23 mg/dL _0 Creatinine 0.61 - 1.24 mg/dL 1.00 0.96 1.21  Sodium 135 - 145 mmol/L 138 134(L) 129(L)  Potassium 3.5 - 5.1 mmol/L 4.5 4.4 4.0  Chloride 98 - 111 mmol/L 106 107 100  CO2 22 - 32 mmol/L - 20(L) 21(L)  Calcium 8.9 - 10.3 mg/dL - 8.2(L) 8.1(L)  Total Protein 6.5 - 8.1 g/dL - 6.6 6.4(L)  Total Bilirubin 0.3 - 1.2 mg/dL - 3.9(H) 3.2(H)  Alkaline Phos 38 - 126 U/L - 113 71  AST 15 - 41 U/L - 125(H) 93(H)  ALT 0 - 44 U/L - 42 45(H)   Studies/Results: CT Angio Chest/Abd/Pel for Dissection W and/or W/WO  Result Date: 02/15/2021 CLINICAL DATA:  Abdominal pain, suspected aortic dissection, right upper quadrant pain, vomiting and hematemesis EXAM: CT ANGIOGRAPHY CHEST, ABDOMEN AND PELVIS TECHNIQUE: Multidetector CT imaging through the chest, abdomen and pelvis was performed using the standard protocol during bolus administration of intravenous contrast. Multiplanar reconstructed images and MIPs were obtained and reviewed to evaluate the vascular anatomy. CONTRAST:  13m OMNIPAQUE IOHEXOL 350 MG/ML SOLN COMPARISON:  None available FINDINGS: CTA CHEST FINDINGS Cardiovascular: Cardiomegaly with mild biatrial enlargement left worse than right. No pericardial effusion. The RV is nondilated. Satisfactory opacification of pulmonary arteries noted, and there is no evidence of pulmonary emboli. 3 vessel coronary calcifications, post LIMA CABG.  Adequate contrast opacification of the thoracic aorta with no evidence of dissection, aneurysm, or stenosis. There is classic 3-vessel brachiocephalic arch anatomy without proximal stenosis. Mild scattered plaque in the arch and descending thoracic aorta. Mediastinum/Nodes: No mass or adenopathy. Limited evaluation for esophageal varices on this arterial phase exam. Lungs/Pleura: Trace right pleural effusion. No pneumothorax. Subcentimeter  calcified granuloma in the posterior left upper lobe. Musculoskeletal: Cervical fixation hardware partially visualized. Sternotomy wires. Review of the MIP images confirms the above findings. CTA ABDOMEN AND PELVIS FINDINGS VASCULAR Aorta: Mild calcified plaque.  No aneurysm, dissection, or stenosis. Celiac: Nonocclusive calcified plaque at the origin, patent distally. SMA: Patent without evidence of aneurysm, dissection, vasculitis or significant stenosis. Renals: Single left, with mild atheromatous plaque, no stenosis. Duplicated right, inferior dominant, both patent. IMA: Patent without evidence of aneurysm, dissection, vasculitis or significant stenosis. Inflow: Patent without evidence of aneurysm, dissection, vasculitis or significant stenosis. Veins: Left splenorenal shunt. Small venous collaterals at the umbilicus. Limited assessment for gastric varices given the arterial phase timing of contrast. Review of the MIP images confirms the above findings. NON-VASCULAR Hepatobiliary: Gallbladder is distended with heterogenous wall, partially calcified stones in its dependent aspect. Nodular small liver without focal lesion or biliary ductal dilatation. Pancreas: Unremarkable. No pancreatic ductal dilatation or surrounding inflammatory changes. Spleen: Moderate splenomegaly without evident lesion. Adrenals/Urinary Tract: Adrenal glands are unremarkable. Kidneys are normal, without renal calculi, focal lesion, or hydronephrosis. Bladder is unremarkable. Stomach/Bowel: The stomach  is incompletely distended. Small bowel decompressed. Appendix not discretely identified. The colon is nondilated, unremarkable. Lymphatic: Increased number of subcentimeter retroperitoneal and central mesenteric/epigastric lymph nodes. Reproductive: Prostate normal in size containing coarse calcifications. Other: Trace pelvic, perihepatic and perisplenic ascites. No ascites. Musculoskeletal: Facet and disc degenerative changes L5-S1. No fracture or worrisome bone lesion. Review of the MIP images confirms the above findings. IMPRESSION: 1. Negative for aortic dissection. 2. Cholelithiasis with distended gallbladder. 3. Nodular small liver suggesting cirrhosis, with stigmata of portal venous hypertension including splenomegaly, ascites, and dilated portosystemic collateral venous pathways. Electronically Signed   By: Lucrezia Europe M.D.   On: 02/15/2021 15:25    Active Problems:   * No active hospital problems. Tye Savoy, NP-C @  02/15/2021, 3:45 PM  I have taken a history, reviewed the chart and examined the patient. I performed a substantive portion of this encounter, including complete performance of at least one of the key components, in conjunction with the APP. I agree with the APP's note, impression and recommendations  65 year old male with decompensated NASH cirrhosis (MELDNa 21/MELD 19) with history of nonbleeding esophageal varices, hepatic encephalopathy and ascites who experienced abrupt onset abdominal pain today in the right hemiabdomen, radiating to his back with subsequent episode of frankly bloody emesis and then 2 further episodes of emesis with little to no blood.  He was hemodynamically stable and denied any symptoms of dizziness/light-headedness.  No melena.  His hgb was near his baseline and his BUN was 10.  He has a history of recurrent epistaxis and did note epistaxis with the hematemesis.   It seems unlikely that his hematemesis was from a clinically significant upper GI  bleed given his normal BUN, vitals and hgb.  I suspect epistaxis as the source of his hematemesis, but I think it is very reasonable to treat medically as if he has an upper GI bleed for now with octreotide, PPI and empiric antibiotics.  If he does have further evidence of upper GI bleeding overnight, then an EGD will be needed.  For now, I would not plan on an EGD.  If no further evidence of GI bleeding tomorrow, then would discontinue the antibiotics and octreotide gtt.  The etiology of the abdominal pain is unclear at this point, but symptomatic cholelithiasis seems the most likely suspected.  The patient is  a very high risk patient for abdominal surgery and elective cholecystectomy would potentially pose more risk than benefit.   Await results of RUQUS and follow clinically.  Hematemesis - Clear liquids for now, NPO after midnight in event Dr. Benson Norway (covering GI this weekend) elects to proceed with EGD - Continue octreotide gtt, Rocephin, PPI IV BID  Cirrhosis with HE/ascites - Continue rifaximin - Holding diuretics today, reassess tomorrow  Abdominal pain - Await [place] results, may need HIDA scan/surgery consult if pain continues.    Elizabet Schweppe E. Candis Schatz, MD [facility]

## 2021-02-15 NOTE — H&P (Addendum)
[facility] Admission History and Physical Service Pager: [phone]  Patient name: Randall Frazier Medical record number: [id number] Date of birth: [birth year] Age: 65 y.o. Gender: male  Primary Care Provider: [place] Frazier: GI Code Status: Full Preferred Emergency Contact: Randall Frazier (wife) [phone]  Chief Complaint: Abdominal pain, hematemesis  Assessment and Plan: Randall Frazier is a 65 y.o. male presenting with abdominal pain and 2 episodes of hematemesis. PMH is significant for cirrhosis 2/2 NASH, esophageal varices, hx hepatic encephalopathy, CAD s/p CABG, gout, type 2 diabetes, and hypertension.  Hematemesis  Abdominal pain  Decompensated NASH Cirrhosis  Patient presented with abdominal pain accompanied by 2 episodes of hematemesis earlier this morning.  In the ED, all VSS. Labs remarkable for FOBT+, Hgb 12.5, Plt 57, INR 2.0, Na 134, albumin 2.3, AST elevated to 125, ALT WNL at 42, T bili elevated to 3.9, WBC 3.6. Troponin and lipase were WNL. These lab values have been consistent over the past year as patient has PMH of decompensated NASH cirrhosis and is currently on liver transplant list.  Last surveillance EGD was September 2021 which showed small esophageal varices. MELD score of 21 on admission.  Home meds include lactulose, Xifaxan, Lasix, spironolactone.   CT angio chest/abdomen/pelvis showed cirrhosis with portal venous hypertension, splenomegaly, ascites, dilated portosystemic collateral venous pathways as well as cholelithiasis with a distended gallbladder.  Patient described RUQ abdominal pain which radiated to his back and right shoulder so we considered cholecystitis. Abdominal ultrasound was also ordered in the ED which was limited due to bowel gas but did not show evidence of cholelithiasis or cholecystitis. Presentation concerning for possible variceal bleed, however patient hemodynamically stable at the present  time. -Admit to FPTS, med-tele, attending Dr. McDiarmid -GI following, appreciate recommendations -IV Protonix 8 mg/h for 72 hours -IV octreotide 25 mL/h -Ceftriaxone 1g -Continue home Lasix 20 mg daily, Spironolactone 25 mg daily -Continue home Rifaximin 550 mg twice daily, Lactulose 30g PO 3 times daily -Clear liquid diet, NPO at MN (for possible EGD tomorrow) -am CBC (check sooner if additional hematemesis or vital sign instability)  Type 2 Diabetes Hemoglobin A1c from 12/15/2019 was 6.  Glucose of 127 today.  Home medication: metformin 769m BID -Continue home metformin 750 mg twice daily  HTN Blood pressure stable, [id number] in the ED. Home medication includes metoprolol 50 mg twice daily. -Hold home metoprolol for now given possible bleed and potential for hypotension -If patient remains hemodynamically stable overnight, can consider restarting metoprolol tomorrow.  [facility] [place] [facility]. On ASA which is being held in the setting of possible GI bleed -Hold home aspirin 81 mg  Gout On Allopurinol as needed  FEN/GI: Clear liquids, NPO at midnight Prophylaxis: SCDs  Disposition: Med telemetry  History of Present Illness:  Randall TORUNOis a 65y.o. male presenting with abdominal pain  Patient reports abdominal pain since this morning. Pain is constant and has worsened throughout the day. Pain radiates to his right shoulder blade. Describes pain as 10/10, has never experienced similar pain in the past. Closest pain he's had to this was when he had a kidney stone. Hasn't taken anything for pain (he is limited in analgesia options due to his liver disease). Endorses associated nausea and hematemesis. He approximates a full cup of blood in his vomit. Had total of 2 episodes of bloody vomit.   Bowel movements have been regular- 3 bowel movements so  far today. Takes lactulose so this is typical for him (goal 4 BM/day)  No alcohol,  tobacco, or recreational drug use.   Review Of Systems: Per HPI with the following additions:   Review of Systems  Constitutional:  Negative for chills and fever.  Respiratory:  Negative for shortness of breath.   Cardiovascular:  Negative for chest pain.  Gastrointestinal:  Positive for abdominal pain and vomiting. Negative for constipation and diarrhea.  Neurological:  Negative for syncope, light-headedness and headaches.  Psychiatric/Behavioral:  Negative for confusion.     Patient Active Problem List   Diagnosis Date Noted   Hepatic encephalopathy 02/19/2020   Portal hypertension with esophageal varices ([place]) 12/16/2019   Duodenal ulcer 12/16/2019   Gastric ulcer 12/16/2019   Gastritis 12/16/2019   DM2 (diabetes mellitus, type 2) ([place]) 12/15/2019   Decompensated hepatic cirrhosis ([place]) 12/15/2019   CAD (coronary artery disease) 12/15/2019   Hyperlipemia 12/15/2019   Essential hypertension 12/15/2019   Dyspnea 12/15/2019   Thrombocytopenia ([place]) 12/15/2019   AKI (acute kidney injury) ([place]) 12/15/2019   Hypoalbuminemia 12/15/2019   Acute hepatic encephalopathy 12/15/2019   Hyponatremia 12/15/2019   Cellulitis of right leg 12/15/2019   Melena 12/14/2019   Cervical spondylosis without myelopathy 12/20/2012    Class: Diagnosis of   Biliary dyskinesia 10/05/2012    Past Medical History: Past Medical History:  Diagnosis Date   Arthritis    shoulder- both, neck, spine, GOUT   Diabetes mellitus    Gout    H/O exercise stress test    many yrs. 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 weight loss -over 18 mon. 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  Family History: Family History  Problem Relation Age of Onset   Liver disease Sister    Diabetes Neg Hx     Allergies and Medications: Allergies  Allergen Reactions   Aspirin Other (See Comments)   Meloxicam Other (See Comments)    Cannot take per MD ( awaiting on liver transplant)   Pork-Derived Products Other (See Comments)    Cannot eat- gout   Statins Other (See Comments)    Cramps    Trazodone And Nefazodone Nausea Only and Other (See Comments)    Caused patient to have a lowered level of tolerance, too   Tylenol [Acetaminophen] Other (See Comments)   Lisinopril Rash   Niacin Rash   Niacin And Related Rash   No current facility-administered medications on file prior to encounter.   Current Outpatient Medications  on File Prior to Encounter  Medication Sig Dispense Refill   allopurinol (ZYLOPRIM) 100 MG tablet Take 100 mg by mouth as needed (AS DIRECTED, FOR GOUT FLARES).      aspirin EC 81 MG tablet Take 81 mg by mouth daily. Swallow whole.     augmented betamethasone dipropionate (DIPROLENE-AF) 0.05 % cream Apply 1 application topically 2 (two) times daily.     Cholecalciferol (VITAMIN D3) 25 MCG (1000 UT) CAPS Take 1,000 Units by mouth daily.     diphenhydrAMINE (BENADRYL) 25 mg capsule Take 25 mg by mouth every 6 (six) hours as needed for itching.      furosemide (LASIX) 20 MG tablet Take 20 mg by mouth  daily.     lactulose (CHRONULAC) 10 GM/15ML solution Take 45 mLs (30 g total) by mouth 3 (three) times daily. Hold for more than 3 large BM's a day. 236 mL 0   loratadine (CLARITIN) 10 MG tablet Take 10 mg by mouth daily as needed for allergies or rhinitis.     LUBRICATING PLUS EYE DROPS 0.5 % SOLN Place 1 drop into both eyes 3 (three) times daily as needed (for irritation).      metFORMIN (GLUCOPHAGE) 500 MG tablet Take 750 mg by mouth 2 (two) times daily with a meal. Taking 1 & 1/2 tabs = 750 mg twice daily     metoprolol tartrate (LOPRESSOR) 50 MG tablet Take 50 mg by mouth 2 (two) times daily.     montelukast (SINGULAIR) 10 MG tablet Take 10 mg by mouth at bedtime.     pantoprazole (PROTONIX) 40 MG tablet Take 40 mg by mouth daily.     spironolactone (ALDACTONE) 25 MG tablet Take 25 mg by mouth daily.      THERAPEUTIC SHAMPOO 0.5 % shampoo Apply 1 application topically See admin instructions. Shampoo at least 4 times a week     vitamin E 180 MG (400 UNITS) capsule Take 400 Units by mouth 2 (two) times daily.     XIFAXAN 550 MG TABS tablet Take 550 mg by mouth 2 (two) times daily.      Objective: BP 130/64   Pulse 62   Temp 97.6 F (36.4 C) (Oral)   Resp 18   SpO2 90%  Exam: General: Alert, pleasant, no acute distress Eyes: No scleral icterus, slightly injected conjunctiva ENTM: MMM Cardiovascular: RRR, normal S1/S2 Respiratory: CTAB, normal work of breathing Gastrointestinal: Abdomen soft, nondistended, normal bowel sounds, slight tenderness to palpation in right upper and lower quadrants. Extremities: 1+ pitting edema of the left lower extremity, 2+ pitting edema of right lower extremity Derm: Slight jaundice, palmar erythema noted, no caput medusae  Neuro: No focal deficits, no asterixis, oriented x3, normal speech Psych: Appropriate mood and affect, good insight  Labs and Imaging: CBC BMET  Recent Labs  Lab 02/15/21 1310 02/15/21 1317  WBC 3.6*  --   HGB 12.5* 12.2*   HCT 36.5* 36.0*  PLT 57*  --    Recent Labs  Lab 02/15/21 1310 02/15/21 1317  NA 134* 138  K 4.4 4.5  CL 107 106  CO2 20*  --   BUN 10 11  CREATININE 0.96 1.00  GLUCOSE 127* 128*  CALCIUM 8.2*  --      EKG: My own interpretation (not copied from electronic read)  normal sinus rhythm, QTC of 486, slightly prolonged PR interval  CT Angio Chest/Abd/Pel for Dissection W and/or W/WO Result Date: 02/15/2021 IMPRESSION: 1. Negative for aortic dissection. 2. Cholelithiasis  with distended gallbladder. 3. Nodular small liver suggesting cirrhosis, with stigmata of portal venous hypertension including splenomegaly, ascites, and dilated portosystemic collateral venous pathways. Electronically Signed   By: Lucrezia Europe M.D.   On: 02/15/2021 15:25   US Abdomen Limited RUQ (LIVER/GB) Result Date: 02/15/2021 IMPRESSION: 1. Cirrhosis with known portal hypertension. Limited evaluation for focal hepatic lesion. Recommend nonemergent MRI liver protocol for further evaluation. When the patient is clinically stable and able to follow directions and hold their breath (preferably as an outpatient) further evaluation with dedicated abdominal MRI should be considered. 2. Left hepatic lobe not visualized due to bowel gas. 3. At least trace volume simple free fluid ascites. Electronically Signed   By: Iven Finn M.D.   On: 02/15/2021 17:20     Precious Gilding, DO 02/15/2021, 4:21 PM PGY-1, [facility] Intern pager: [phone], text pages welcome   FPTS Upper-Level Resident Addendum I have independently interviewed and examined the patient. I have discussed the above with Dr. Ronnald Ramp and agree with the documented plan. My edits for correction/addition/clarification are included above. Please see any attending notes.   Alcus Dad, MD PGY-2, [facility] 02/15/2021 7:59 PM  [facility] Service pager: [phone] (text pages welcome through [facility])

## 2021-02-15 NOTE — ED Triage Notes (Signed)
Pt BIB [facility] from home c/o RUQ abd pain. Per EMS , Pt had sudden onset vomiting red blood 3 times this morning.   Pt on Liver Transplant .   -100 fentanyl.   -RA 99%  -160/100  -HR 60

## 2021-02-15 NOTE — ED Notes (Signed)
Transplant center number [phone]

## 2021-02-15 NOTE — Consult Note (Addendum)
Referring Provider:  [facility]         Primary Care Physician:  [place] Primary Gastroenterologist:  Followed by [facility] and [facility] in [place], [place]  Reason for Consultation:  hematemesis                 ASSESSMENT / PLAN   # 65 yo male with acute RUQ pain and hematemesis in setting of decompensated NASH cirrhosis. On transplant list at [facility] in [place], [place] . Also followed by [facility] but MELD hasn't been high enough for listing. Records reviewed in Care Where. He has history of small esophageal varices on last surveillance EGD in September 2021. He also has a history of gastric and duodenal ulcers in 2021.  He is hemodynamically stable. Normal BUN. No melena. Hgb 12.2 ( baseline is ~13). Low suspicion for variceal bleed. Hematemesis possibly related to epistaxis. Bleeding from PUD possible but seems less likely.  --No plans for EGD at this point. Monitor closely --For now, for precautionary measures recommend continuation of PPI infusion, Octreotide, and Rocephin ( SBP prophylaxis / ascites on imaging) --No evidence for hepatic encephalopathy. Continue home lactulose and xifaxan --Renal function is normal. He has peripheral edema. Hopefully get him back on diuretics soon.  --Clear liquids okay, will make NPO after MN. Dr. Benson Norway / Dr. Collene Mares covering for our service this weekend. Depending on clinical course ( if ongoing bleeding) then may need EGD tomorrow but will see how he does overnight.  --INR is 2.  --Has ascites but is afebrile without leukocytosis. No significant abdominal tenderness.    # Severe RUQ pain this am. Pain radiating around to back and into shoulder. Etiology unclear. He has known cholelithiasis but no evidence for cholecystitis on CT angio earlier today and not tender in RUQ. Getting RUQ [place] later today. AST mildly elevated but alk phos is normal. Bilirubin is 3.9 which is close to what is was in April 2022. at [facility] --Pain  relieved with pain meds. Await [place] results   HISTORY OF PRESENT ILLNESS                                                                                                                         Chief Complaint:  vomiting blood  Randall Frazier is a 65 y.o. male with a past medical history significant for  NASH cirrhosis, duodenal ulcers. HLD, DM, PTSD, CAD s/p CABG, hx of sphnoid wing meningioma s/p gamma knife. See PMH for any additional medical history.   Patient has NASH cirrhosis complicated by portal HTN with history of ascites, hepatic encephalopathy and esophageal varices. He is followed at [facility] but MELD score hasn't been high enough to be listed for transplant. He is on transplant list with [facility].   Reviewed notes in Care Everywhere but unable to locate endoscopy reports. Notes from [facility] liver transplant hepatologist state opatient had variceal surveillance EGD on 02/03/20 with findings of small esophageal varices  and 2 duodenal ulcers.  Last follow up with [facility] was 11/13/20 with Dr. Mindi Junker. He was haivng increased swelling in legs at that time but weight was down. Still wasn't candidate for transplant listing, MELD was 20.  Diuretic continued ( 33m spironolactone daily and 239mof furosemide daily). Treatment for depression was recommended.   ED course:  Hemodynamically stable.  Transient hypoxia, now resolved.  No leukocytosis.  Hemoglobin 12.5 (close to baseline).  Platelets 57, INR 2. CTA ruled out dissection  Patient developed acute right upper quadrant pain this morning.  The pain wrapped around his abdomen into his shoulder.  He subsequently had nausea and vomited what he describes as a large amount of bright red blood.  After vomiting he began having bleeding from right nare. He has had epistaxis from left nare for years . He vomited a second time and emesis contained a smaller amount of red blood.  He has not had any rectal bleeding/melena.  He has never experienced  pain like this before in the right upper quadrant.  No NSAID use other than daily baby asa. He takes daily protonix. He is having 4-5 bowel movements a day on lactulose. He is on low dose diuretics at home. He has lost ~ 50 pounds due to dietary changes. He does complain of swelling of lower extremities.  No nausea/vomiting since 11 AM today.    SIGNIFICANT DIAGNOTIC STUDIES   05/07/20 [facility]  [facility] Impression:  Hepatic findings:  Cirrhotic liver morphology with sequela of portal hypertension including  splenomegaly and small upper abdominal/paraesophageal varices. Hypoenhancing 0.7 cm lesion in the inferior right hepatic lobe favoring to  be a benign hepatic cyst. LR-2.   Extrahepatic findings:  Cholelithiasis without findings of acute cholecystitis.   Findings outside of the Hepatobiliary system:    Top normal heart size.  Partially visualized marked coronary calcifications.  Partially visualized median sternotomy wires.  Small fat and vessel-containing umbilical hernia.  Moderate atherosclerotic calcifications of the abdominal vasculature.  Degenerative changes of the spine.  Duplicated right renal arteries.  Top normal retroperitoneal lymph nodes, favored reactive.   Past Medical History:  Diagnosis Date   Arthritis    shoulder- both, neck, spine, GOUT   Diabetes mellitus    Gout    H/O exercise stress test    many yrs. 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 weight loss -over 18 mon. 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 Authorizing Provider  allopurinol (ZYLOPRIM) 100 MG tablet Take 100 mg by mouth as needed (AS DIRECTED, FOR GOUT FLARES).    Yes [provider]  augmented betamethasone dipropionate (DIPROLENE-AF) 0.05 % cream Apply 1 application topically 2 (two) times daily. 02/01/20  Yes [provider]  Cholecalciferol (VITAMIN D3) 50 MCG (2000 UT) TABS Take 2,000 Units by mouth daily.   Yes [provider]  diphenhydrAMINE (BENADRYL) 25 mg capsule Take 25 mg by mouth every 6 (six) hours as needed for itching.    Yes [provider]  lactulose (CHRONULAC) 10 GM/15ML solution Take 45 mLs (30 g total) by mouth 3 (three) times daily. Hold for more than 3 large BM's a day. 02/22/20  Yes Swayze, Ava, DO  loratadine (CLARITIN) 10 MG tablet Take 10 mg by mouth daily as needed for allergies or rhinitis.   Yes [provider]  LUBRICATING PLUS EYE DROPS 0.5 % SOLN Place 1 drop into both eyes 3 (three) times daily as needed (for irritation).  10/05/19  Yes [provider]  metFORMIN (GLUCOPHAGE) 1000 MG tablet Take 1,000 mg by mouth 2 (two) times daily with a meal.   Yes [provider]  pantoprazole (PROTONIX) 40 MG tablet Take 40 mg by mouth 2 (two) times daily before a meal. 12/19/19  Yes [provider]  THERAPEUTIC SHAMPOO 0.5 % shampoo Apply 1 application topically See admin instructions. Shampoo at least 4 times a week 10/05/19  Yes [provider]  vitamin E 180 MG (400 UNITS) capsule Take 400 Units by mouth daily.   Yes [provider]  magnesium oxide (MAG-OX) 400 (241.3 Mg) MG tablet Take 2 tablets (800 mg total) by mouth 2 (two) times daily.  02/22/20   Swayze, Ava, DO  montelukast (SINGULAIR) 10 MG tablet Take 10 mg by mouth at bedtime.    [provider]  spironolactone (ALDACTONE) 25 MG tablet Take 25 mg by mouth daily.     [provider]  XIFAXAN 550 MG TABS tablet Take 550 mg by mouth 2 (two) times daily. 12/12/20   [provider]    Current Facility-Administered Medications  Medication Dose Route Frequency Provider Last Rate Last Admin   cefTRIAXone (ROCEPHIN) 1 g in sodium chloride 0.9 % 100 mL IVPB  1 g Intravenous Once Gareth Morgan, MD       octreotide (SANDOSTATIN) 2 mcg/mL load via infusion 50 mcg  50 mcg Intravenous Once Gareth Morgan, MD       And   octreotide (SANDOSTATIN) 500 mcg in sodium chloride 0.9 % 250 mL (2 mcg/mL) infusion  50 mcg/hr Intravenous Continuous Gareth Morgan, MD       Derrill Memo ON 02/19/2021] pantoprazole (PROTONIX) injection 40 mg  40 mg Intravenous Q12H Gareth Morgan, MD       pantoprozole (PROTONIX) 80 mg /NS 100 mL infusion  8 mg/hr Intravenous Continuous Gareth Morgan, MD       Current Outpatient Medications  Medication Sig Dispense Refill   allopurinol (ZYLOPRIM) 100 MG tablet Take 100 mg by mouth as needed (AS DIRECTED, FOR GOUT FLARES).      augmented betamethasone dipropionate (DIPROLENE-AF) 0.05 % cream Apply 1 application topically 2 (two) times daily.     Cholecalciferol (VITAMIN D3) 50 MCG (2000 UT) TABS Take 2,000 Units by mouth daily.     diphenhydrAMINE (BENADRYL) 25 mg capsule Take 25 mg by mouth every 6 (six) hours as needed for itching.      lactulose (CHRONULAC) 10 GM/15ML solution Take 45 mLs (30 g total) by mouth 3 (three) times daily. Hold for more than 3 large BM's a day. 236 mL 0   loratadine (CLARITIN) 10 MG tablet Take 10 mg by mouth daily as needed for allergies or rhinitis.  LUBRICATING PLUS EYE DROPS 0.5 % SOLN Place 1 drop into both eyes 3 (three) times daily as needed (for irritation).      metFORMIN (GLUCOPHAGE) 1000 MG  tablet Take 1,000 mg by mouth 2 (two) times daily with a meal.     pantoprazole (PROTONIX) 40 MG tablet Take 40 mg by mouth 2 (two) times daily before a meal.     THERAPEUTIC SHAMPOO 0.5 % shampoo Apply 1 application topically See admin instructions. Shampoo at least 4 times a week     vitamin E 180 MG (400 UNITS) capsule Take 400 Units by mouth daily.     magnesium oxide (MAG-OX) 400 (241.3 Mg) MG tablet Take 2 tablets (800 mg total) by mouth 2 (two) times daily. 60 tablet 0   montelukast (SINGULAIR) 10 MG tablet Take 10 mg by mouth at bedtime.     spironolactone (ALDACTONE) 25 MG tablet Take 25 mg by mouth daily.      XIFAXAN 550 MG TABS tablet Take 550 mg by mouth 2 (two) times daily.      Allergies as of 02/15/2021 - Review Complete 02/15/2021  Allergen Reaction Noted   Aspirin Other (See Comments) 02/20/2020   Meloxicam Other (See Comments) 02/15/2021   Pork-derived products Other (See Comments) 12/14/2019   Statins Other (See Comments) 03/06/2015   Trazodone and nefazodone Nausea Only and Other (See Comments) 12/14/2019   Tylenol [acetaminophen] Other (See Comments) 02/20/2020   Lisinopril Rash 07/27/2012   Niacin Rash 04/24/2015   Niacin and related Rash 10/26/2011    Family History  Problem Relation Age of Onset   Liver disease Sister    Diabetes Neg Hx     Social History   Socioeconomic History   Marital status: Married    Spouse name: Not on file   Number of children: Not on file   Years of education: Not on file   Highest education level: Not on file  Occupational History   Not on file  Tobacco Use   Smoking status: Never   Smokeless tobacco: Never  Substance and Sexual Activity   Alcohol use: Yes    Comment: 2 beers / year- very rare   Drug use: No   Sexual activity: Not on file  Other Topics Concern   Not on file  Social History Narrative   Not on file   Social Determinants of Health   Financial Resource Strain: Not on file  Food Insecurity: Not on  file  Transportation Needs: Not on file  Physical Activity: Not on file  Stress: Not on file  Social Connections: Not on file  Intimate Partner Violence: Not on file    Review of Systems: RLE chronic itching. Chronic epistaxis. All systems reviewed and negative except where noted in HPI.   OBJECTIVE    Physical Exam: Vital signs in last 24 hours: Temp:  [97.6 F (36.4 C)] 97.6 F (36.4 C) (10/07 1229) Pulse Rate:  [59-65] 59 (10/07 1324) Resp:  [9-15] 15 (10/07 1300) BP: (140-148)/(73-86) 140/73 (10/07 1245) SpO2:  [87 %-94 %] 87 % (10/07 1324)   General:   Alert  male in NAD Psych:  Pleasant, cooperative. Normal mood and affect. Eyes:  Pupils equal, sclera clear, no icterus.   Conjunctiva pink. Ears:  Normal auditory acuity. Nose:  No deformity, discharge,  or lesions. Neck:  Supple; no masses Lungs:  Clear throughout to auscultation.   No wheezes, crackles, or rhonchi.  Heart:  Regular rate and rhythm;  3+BLE edema Abdomen:  Soft, mildly distended. Mid lower abdominal tenderness.  NO RUQ tenderness. BS active, Rectal:  Deferred. Light brown stool on exam by EDP per RN. Hemoccults pending Msk:  Symmetrical without gross deformities. . Neurologic:  Alert and  oriented x4;  grossly normal neurologically. No asterixis   Scheduled inpatient medications  octreotide  50 mcg Intravenous Once   [START ON 02/19/2021] pantoprazole  40 mg Intravenous Q12H      Intake/Output from previous day: No intake/output data recorded. Intake/Output this shift: No intake/output data recorded.   Lab Results: Recent Labs    02/15/21 1310 02/15/21 1317  WBC 3.6*  --   HGB 12.5* 12.2*  HCT 36.5* 36.0*  PLT 57*  --    BMET Recent Labs    02/15/21 1310 02/15/21 1317  NA 134* 138  K 4.4 4.5  CL 107 106  CO2 20*  --   GLUCOSE 127* 128*  BUN 10 11  CREATININE 0.96 1.00  CALCIUM 8.2*  --    LFT Recent Labs    02/15/21 1310  PROT 6.6  ALBUMIN 2.3*  AST 125*  ALT 42   ALKPHOS 113  BILITOT 3.9*   PT/INR Recent Labs    02/15/21 1310  LABPROT 22.3*  INR 2.0*   Hepatitis Panel No results for input(s): HEPBSAG, HCVAB, HEPAIGM, HEPBIGM in the last 72 hours.   . CBC Latest Ref Rng & Units 02/15/2021 02/15/2021 02/22/2020  WBC 4.0 - 10.5 K/uL - 3.6(L) 5.0  Hemoglobin 13.0 - 17.0 g/dL 12.2(L) 12.5(L) 12.8(L)  Hematocrit 39.0 - 52.0 % 36.0(L) 36.5(L) 35.5(L)  Platelets 150 - 400 K/uL - 57(L) 57(L)    . CMP Latest Ref Rng & Units 02/15/2021 02/15/2021 02/22/2020  Glucose 70 - 99 mg/dL 128(H) 127(H) 136(H)  BUN 8 - 23 mg/dL _0 Creatinine 0.61 - 1.24 mg/dL 1.00 0.96 1.21  Sodium 135 - 145 mmol/L 138 134(L) 129(L)  Potassium 3.5 - 5.1 mmol/L 4.5 4.4 4.0  Chloride 98 - 111 mmol/L 106 107 100  CO2 22 - 32 mmol/L - 20(L) 21(L)  Calcium 8.9 - 10.3 mg/dL - 8.2(L) 8.1(L)  Total Protein 6.5 - 8.1 g/dL - 6.6 6.4(L)  Total Bilirubin 0.3 - 1.2 mg/dL - 3.9(H) 3.2(H)  Alkaline Phos 38 - 126 U/L - 113 71  AST 15 - 41 U/L - 125(H) 93(H)  ALT 0 - 44 U/L - 42 45(H)   Studies/Results: CT Angio Chest/Abd/Pel for Dissection W and/or W/WO  Result Date: 02/15/2021 CLINICAL DATA:  Abdominal pain, suspected aortic dissection, right upper quadrant pain, vomiting and hematemesis EXAM: CT ANGIOGRAPHY CHEST, ABDOMEN AND PELVIS TECHNIQUE: Multidetector CT imaging through the chest, abdomen and pelvis was performed using the standard protocol during bolus administration of intravenous contrast. Multiplanar reconstructed images and MIPs were obtained and reviewed to evaluate the vascular anatomy. CONTRAST:  146m OMNIPAQUE IOHEXOL 350 MG/ML SOLN COMPARISON:  None available FINDINGS: CTA CHEST FINDINGS Cardiovascular: Cardiomegaly with mild biatrial enlargement left worse than right. No pericardial effusion. The RV is nondilated. Satisfactory opacification of pulmonary arteries noted, and there is no evidence of pulmonary emboli. 3 vessel coronary calcifications, post LIMA CABG.  Adequate contrast opacification of the thoracic aorta with no evidence of dissection, aneurysm, or stenosis. There is classic 3-vessel brachiocephalic arch anatomy without proximal stenosis. Mild scattered plaque in the arch and descending thoracic aorta. Mediastinum/Nodes: No mass or adenopathy. Limited evaluation for esophageal varices on this arterial phase exam. Lungs/Pleura: Trace right pleural effusion. No pneumothorax. Subcentimeter  calcified granuloma in the posterior left upper lobe. Musculoskeletal: Cervical fixation hardware partially visualized. Sternotomy wires. Review of the MIP images confirms the above findings. CTA ABDOMEN AND PELVIS FINDINGS VASCULAR Aorta: Mild calcified plaque.  No aneurysm, dissection, or stenosis. Celiac: Nonocclusive calcified plaque at the origin, patent distally. SMA: Patent without evidence of aneurysm, dissection, vasculitis or significant stenosis. Renals: Single left, with mild atheromatous plaque, no stenosis. Duplicated right, inferior dominant, both patent. IMA: Patent without evidence of aneurysm, dissection, vasculitis or significant stenosis. Inflow: Patent without evidence of aneurysm, dissection, vasculitis or significant stenosis. Veins: Left splenorenal shunt. Small venous collaterals at the umbilicus. Limited assessment for gastric varices given the arterial phase timing of contrast. Review of the MIP images confirms the above findings. NON-VASCULAR Hepatobiliary: Gallbladder is distended with heterogenous wall, partially calcified stones in its dependent aspect. Nodular small liver without focal lesion or biliary ductal dilatation. Pancreas: Unremarkable. No pancreatic ductal dilatation or surrounding inflammatory changes. Spleen: Moderate splenomegaly without evident lesion. Adrenals/Urinary Tract: Adrenal glands are unremarkable. Kidneys are normal, without renal calculi, focal lesion, or hydronephrosis. Bladder is unremarkable. Stomach/Bowel: The stomach  is incompletely distended. Small bowel decompressed. Appendix not discretely identified. The colon is nondilated, unremarkable. Lymphatic: Increased number of subcentimeter retroperitoneal and central mesenteric/epigastric lymph nodes. Reproductive: Prostate normal in size containing coarse calcifications. Other: Trace pelvic, perihepatic and perisplenic ascites. No ascites. Musculoskeletal: Facet and disc degenerative changes L5-S1. No fracture or worrisome bone lesion. Review of the MIP images confirms the above findings. IMPRESSION: 1. Negative for aortic dissection. 2. Cholelithiasis with distended gallbladder. 3. Nodular small liver suggesting cirrhosis, with stigmata of portal venous hypertension including splenomegaly, ascites, and dilated portosystemic collateral venous pathways. Electronically Signed   By: Lucrezia Europe M.D.   On: 02/15/2021 15:25    Active Problems:   * No active hospital problems. Tye Savoy, NP-C @  02/15/2021, 3:45 PM  I have taken a history, reviewed the chart and examined the patient. I performed a substantive portion of this encounter, including complete performance of at least one of the key components, in conjunction with the APP. I agree with the APP's note, impression and recommendations  65 year old male with decompensated NASH cirrhosis (MELDNa 21/MELD 19) with history of nonbleeding esophageal varices, hepatic encephalopathy and ascites who experienced abrupt onset abdominal pain today in the right hemiabdomen, radiating to his back with subsequent episode of frankly bloody emesis and then 2 further episodes of emesis with little to no blood.  He was hemodynamically stable and denied any symptoms of dizziness/light-headedness.  No melena.  His hgb was near his baseline and his BUN was 10.  He has a history of recurrent epistaxis and did note epistaxis with the hematemesis.   It seems unlikely that his hematemesis was from a clinically significant upper GI  bleed given his normal BUN, vitals and hgb.  I suspect epistaxis as the source of his hematemesis, but I think it is very reasonable to treat medically as if he has an upper GI bleed for now with octreotide, PPI and empiric antibiotics.  If he does have further evidence of upper GI bleeding overnight, then an EGD will be needed.  For now, I would not plan on an EGD.  If no further evidence of GI bleeding tomorrow, then would discontinue the antibiotics and octreotide gtt.  The etiology of the abdominal pain is unclear at this point, but symptomatic cholelithiasis seems the most likely suspected.  The patient is  a very high risk patient for abdominal surgery and elective cholecystectomy would potentially pose more risk than benefit.   Await results of RUQUS and follow clinically.  Hematemesis - Clear liquids for now, NPO after midnight in event Dr. Benson Norway (covering GI this weekend) elects to proceed with EGD - Continue octreotide gtt, Rocephin, PPI IV BID  Cirrhosis with HE/ascites - Continue rifaximin - Holding diuretics today, reassess tomorrow  Abdominal pain - Await [place] results, may need HIDA scan/surgery consult if pain continues.    Geanie Pacifico E. Candis Schatz, MD [facility]

## 2021-02-16 ENCOUNTER — Other Ambulatory Visit: Payer: Self-pay

## 2021-02-16 ENCOUNTER — Encounter (HOSPITAL_COMMUNITY): Payer: Self-pay | Admitting: Student

## 2021-02-16 ENCOUNTER — Observation Stay (HOSPITAL_COMMUNITY): Payer: No Typology Code available for payment source | Admitting: Certified Registered Nurse Anesthetist

## 2021-02-16 ENCOUNTER — Encounter (HOSPITAL_COMMUNITY)
Admission: EM | Disposition: A | Discharge status: Home / Self Care | Payer: Self-pay | Source: Home / Self Care | Attending: Family Medicine

## 2021-02-16 DIAGNOSIS — Z20822 Contact with and (suspected) exposure to covid-19: Secondary | ICD-10-CM | POA: Diagnosis present

## 2021-02-16 DIAGNOSIS — Z86011 Personal history of benign neoplasm of the brain: Secondary | ICD-10-CM | POA: Diagnosis not present

## 2021-02-16 DIAGNOSIS — M109 Gout, unspecified: Secondary | ICD-10-CM | POA: Diagnosis present

## 2021-02-16 DIAGNOSIS — Z7682 Awaiting organ transplant status: Secondary | ICD-10-CM | POA: Diagnosis not present

## 2021-02-16 DIAGNOSIS — R040 Epistaxis: Secondary | ICD-10-CM | POA: Diagnosis present

## 2021-02-16 DIAGNOSIS — K7469 Other cirrhosis of liver: Secondary | ICD-10-CM

## 2021-02-16 DIAGNOSIS — K7581 Nonalcoholic steatohepatitis (NASH): Secondary | ICD-10-CM | POA: Diagnosis present

## 2021-02-16 DIAGNOSIS — I8510 Secondary esophageal varices without bleeding: Secondary | ICD-10-CM | POA: Diagnosis present

## 2021-02-16 DIAGNOSIS — K31811 Angiodysplasia of stomach and duodenum with bleeding: Secondary | ICD-10-CM | POA: Diagnosis present

## 2021-02-16 DIAGNOSIS — Z8711 Personal history of peptic ulcer disease: Secondary | ICD-10-CM | POA: Diagnosis not present

## 2021-02-16 DIAGNOSIS — I2510 Atherosclerotic heart disease of native coronary artery without angina pectoris: Secondary | ICD-10-CM | POA: Diagnosis present

## 2021-02-16 DIAGNOSIS — Z951 Presence of aortocoronary bypass graft: Secondary | ICD-10-CM | POA: Diagnosis not present

## 2021-02-16 DIAGNOSIS — K920 Hematemesis: Secondary | ICD-10-CM | POA: Diagnosis not present

## 2021-02-16 DIAGNOSIS — R52 Pain, unspecified: Secondary | ICD-10-CM | POA: Diagnosis present

## 2021-02-16 DIAGNOSIS — R791 Abnormal coagulation profile: Secondary | ICD-10-CM | POA: Diagnosis present

## 2021-02-16 DIAGNOSIS — F4310 Post-traumatic stress disorder, unspecified: Secondary | ICD-10-CM | POA: Diagnosis present

## 2021-02-16 DIAGNOSIS — Z7984 Long term (current) use of oral hypoglycemic drugs: Secondary | ICD-10-CM | POA: Diagnosis not present

## 2021-02-16 DIAGNOSIS — Z981 Arthrodesis status: Secondary | ICD-10-CM | POA: Diagnosis not present

## 2021-02-16 DIAGNOSIS — I10 Essential (primary) hypertension: Secondary | ICD-10-CM | POA: Diagnosis present

## 2021-02-16 DIAGNOSIS — K922 Gastrointestinal hemorrhage, unspecified: Secondary | ICD-10-CM | POA: Diagnosis present

## 2021-02-16 DIAGNOSIS — E119 Type 2 diabetes mellitus without complications: Secondary | ICD-10-CM | POA: Diagnosis present

## 2021-02-16 DIAGNOSIS — Z79899 Other long term (current) drug therapy: Secondary | ICD-10-CM | POA: Diagnosis not present

## 2021-02-16 DIAGNOSIS — E785 Hyperlipidemia, unspecified: Secondary | ICD-10-CM | POA: Diagnosis present

## 2021-02-16 DIAGNOSIS — Z888 Allergy status to other drugs, medicaments and biological substances status: Secondary | ICD-10-CM | POA: Diagnosis not present

## 2021-02-16 DIAGNOSIS — K766 Portal hypertension: Secondary | ICD-10-CM | POA: Diagnosis present

## 2021-02-16 HISTORY — PX: HOT HEMOSTASIS: SHX5433

## 2021-02-16 HISTORY — PX: ESOPHAGOGASTRODUODENOSCOPY (EGD) WITH PROPOFOL: SHX5813

## 2021-02-16 LAB — COMPREHENSIVE METABOLIC PANEL
ALT: 42 U/L (ref 0–44)
AST: 112 U/L — ABNORMAL HIGH (ref 15–41)
Albumin: 2.2 g/dL — ABNORMAL LOW (ref 3.5–5.0)
Alkaline Phosphatase: 86 U/L (ref 38–126)
Anion gap: 7 (ref 5–15)
BUN: 7 mg/dL — ABNORMAL LOW (ref 8–23)
CO2: 23 mmol/L (ref 22–32)
Calcium: 8.1 mg/dL — ABNORMAL LOW (ref 8.9–10.3)
Chloride: 103 mmol/L (ref 98–111)
Creatinine, Ser: 1.09 mg/dL (ref 0.61–1.24)
GFR, Estimated: >60 mL/min (ref >60)
Glucose, Bld: 97 mg/dL (ref 70–99)
Potassium: 4.1 mmol/L (ref 3.5–5.1)
Sodium: 133 mmol/L — ABNORMAL LOW (ref 135–145)
Total Bilirubin: 5.3 mg/dL — ABNORMAL HIGH (ref 0.3–1.2)
Total Protein: 6.5 g/dL (ref 6.5–8.1)

## 2021-02-16 LAB — CBC
HCT: 39.7 % (ref 39.0–52.0)
Hemoglobin: 13.6 g/dL (ref 13.0–17.0)
MCH: 34.9 pg — ABNORMAL HIGH (ref 26.0–34.0)
MCHC: 34.3 g/dL (ref 30.0–36.0)
MCV: 101.8 fL — ABNORMAL HIGH (ref 80.0–100.0)
Platelets: 68 10*3/uL — ABNORMAL LOW (ref 150–400)
RBC: 3.9 MIL/uL — ABNORMAL LOW (ref 4.22–5.81)
RDW: 15.6 % — ABNORMAL HIGH (ref 11.5–15.5)
WBC: 5.6 10*3/uL (ref 4.0–10.5)
nRBC: 0 % (ref 0.0–0.2)

## 2021-02-16 LAB — TROPONIN I (HIGH SENSITIVITY): Troponin I (High Sensitivity): 5 ng/L (ref <18)

## 2021-02-16 SURGERY — ESOPHAGOGASTRODUODENOSCOPY (EGD) WITH PROPOFOL
Anesthesia: Monitor Anesthesia Care

## 2021-02-16 MED ORDER — PHYTONADIONE 1 MG/0.5 ML ORAL SOLUTION
1.0000 mg | Freq: Every day | ORAL | Status: AC
  Administered 2021-02-16 – 2021-02-18 (×3): 1 mg via ORAL
  Filled 2021-02-16 (×3): qty 0.5

## 2021-02-16 MED ORDER — PROPOFOL 10 MG/ML IV BOLUS
INTRAVENOUS | Status: DC | PRN
  Administered 2021-02-16: 20 mg via INTRAVENOUS

## 2021-02-16 MED ORDER — LACTATED RINGERS IV SOLN
INTRAVENOUS | Status: AC | PRN
  Administered 2021-02-16: 20 mL/h via INTRAVENOUS

## 2021-02-16 MED ORDER — PROPOFOL 500 MG/50ML IV EMUL
INTRAVENOUS | Status: DC | PRN
  Administered 2021-02-16: 125 ug/kg/min via INTRAVENOUS

## 2021-02-16 MED ORDER — SODIUM CHLORIDE 0.9 % IV SOLN: INTRAVENOUS | Status: DC

## 2021-02-16 MED ORDER — LIDOCAINE 2% (20 MG/ML) 5 ML SYRINGE
INTRAMUSCULAR | Status: DC | PRN
Start: 2021-02-16 — End: 2021-02-16
  Administered 2021-02-16: 60 mg via INTRAVENOUS

## 2021-02-16 MED ORDER — METOPROLOL TARTRATE 50 MG PO TABS
50.0000 mg | ORAL_TABLET | Freq: Two times a day (BID) | ORAL | Status: DC
  Administered 2021-02-16 – 2021-02-18 (×5): 50 mg via ORAL
  Filled 2021-02-16 (×5): qty 1

## 2021-02-16 SURGICAL SUPPLY — 14 items

## 2021-02-16 NOTE — Anesthesia Postprocedure Evaluation (Signed)
Anesthesia Post Note  Patient: Randall Frazier  Procedure(s) Performed: ESOPHAGOGASTRODUODENOSCOPY (EGD) WITH PROPOFOL HOT HEMOSTASIS (ARGON PLASMA COAGULATION/BICAP)     Patient location during evaluation: PACU Anesthesia Type: MAC Level of consciousness: awake Pain management: pain level controlled Vital Signs Assessment: post-procedure vital signs reviewed and stable Respiratory status: spontaneous breathing, nonlabored ventilation, respiratory function stable and patient connected to nasal cannula oxygen Cardiovascular status: stable and blood pressure returned to baseline Postop Assessment: no apparent nausea or vomiting Anesthetic complications: no   No notable events documented.  Last Vitals:  Vitals:   02/16/21 1042 02/16/21 1414  BP: (!) 121/52 (!) 102/54  Pulse: 79 67  Resp:  15  Temp: 37.1 C 37.2 C  SpO2: 92% 98%    Last Pain:  Vitals:   02/16/21 1414  TempSrc: Oral  PainSc:                  Jackey Housey P Lynsey Ange

## 2021-02-16 NOTE — Transfer of Care (Signed)
Immediate Anesthesia Transfer of Care Note  Patient: Randall Frazier  Procedure(s) Performed: ESOPHAGOGASTRODUODENOSCOPY (EGD) WITH PROPOFOL HOT HEMOSTASIS (ARGON PLASMA COAGULATION/BICAP)  Patient Location: PACU  Anesthesia Type:MAC  Level of Consciousness: drowsy  Airway & Oxygen Therapy: Patient Spontanous Breathing and Patient connected to nasal cannula oxygen  Post-op Assessment: Report given to RN and Post -op Vital signs reviewed and stable  Post vital signs: Reviewed and stable  Last Vitals:  Vitals Value Taken Time  BP 127/66 02/16/21 0857  Temp 36.2 C 02/16/21 0858  Pulse 69 02/16/21 0858  Resp 20 02/16/21 0858  SpO2 95 % 02/16/21 0858  Vitals shown include unvalidated device data.  Last Pain:  Vitals:   02/16/21 0802  TempSrc: Oral  PainSc: 0-No pain         Complications: No notable events documented.

## 2021-02-16 NOTE — Interval H&P Note (Signed)
History and Physical Interval Note:  02/16/2021 8:40 AM  Randall Frazier  has presented today for surgery, with the diagnosis of Hematemesis.  The various methods of treatment have been discussed with the patient and family. After consideration of risks, benefits and other options for treatment, the patient has consented to  Procedure(s): ESOPHAGOGASTRODUODENOSCOPY (EGD) WITH PROPOFOL (N/A) as a surgical intervention.  The patient's history has been reviewed, patient examined, no change in status, stable for surgery.  I have reviewed the patient's chart and labs.  Questions were answered to the patient's satisfaction.     Chelsey Redondo D

## 2021-02-16 NOTE — Anesthesia Preprocedure Evaluation (Signed)
Anesthesia Evaluation  Patient identified by MRN, date of birth, ID band Patient awake    Reviewed: Allergy & Precautions, NPO status , Patient's Chart, lab work & pertinent test results  Airway Mallampati: III  TM Distance: >3 FB Neck ROM: Full    Dental no notable dental hx.    Pulmonary neg pulmonary ROS,    Pulmonary exam normal breath sounds clear to auscultation       Cardiovascular hypertension, Pt. on home beta blockers + CAD and + CABG  Normal cardiovascular exam Rhythm:Regular Rate:Normal  ECHO: 1. Left ventricular ejection fraction, by estimation, is 55 to 60%. The left ventricle has normal function. The left ventricle has no regional wall motion abnormalities. There is moderate left ventricular hypertrophy of the basal-septal segment. Left ventricular diastolic parameters are consistent with Grade II diastolic  dysfunction (pseudonormalization). Elevated left atrial pressure.  2. Right ventricular systolic function is normal. The right ventricular size is normal.  3. Left atrial size was mildly dilated.  4. The mitral valve is normal in structure. Mild mitral valve  regurgitation. No evidence of mitral stenosis.  5. The aortic valve is tricuspid. Aortic valve regurgitation is not visualized. No aortic stenosis is present.    Neuro/Psych Hepatic encephalopathy negative psych ROS   GI/Hepatic PUD, (+) Cirrhosis   Esophageal Varices  substance abuse  ,   Endo/Other  diabetes, Oral Hypoglycemic Agents  Renal/GU negative Renal ROS     Musculoskeletal  (+) Arthritis , Gout   Abdominal (+) + obese,   Peds  Hematology  (+) Blood dyscrasia, , HLD Thrombocytopenia    Anesthesia Other Findings Hematemesis  Reproductive/Obstetrics                             Anesthesia Physical Anesthesia Plan  ASA: 4  Anesthesia Plan: MAC   Post-op Pain Management:    Induction:  Intravenous  PONV Risk Score and Plan: 1 and Propofol infusion and Treatment may vary due to age or medical condition  Airway Management Planned: Nasal Cannula  Additional Equipment:   Intra-op Plan:   Post-operative Plan:   Informed Consent: I have reviewed the patients History and Physical, chart, labs and discussed the procedure including the risks, benefits and alternatives for the proposed anesthesia with the patient or authorized representative who has indicated his/her understanding and acceptance.     Dental advisory given  Plan Discussed with: CRNA  Anesthesia Plan Comments:         Anesthesia Quick Evaluation

## 2021-02-16 NOTE — Progress Notes (Addendum)
Family Medicine Teaching Service Daily Progress Note Intern Pager: [phone]  Patient name: Randall Frazier Medical record number: [id number] Date of birth: May 27, 1955 Age: 65 y.o. Gender: male  Primary Care Provider: [place] Consultants: GI Code Status: FULL  Pt Overview and Major Events to Date:  10/07: Admitted to [place] 10/08: EGD  Assessment and Plan: Randall Frazier is a 65 y.o. male presenting with abdominal pain and 2 episodes of hematemesis. PMH is significant for cirrhosis 2/2 NASH, esophageal varices, hx hepatic encephalopathy, CAD s/p CABG, gout, type 2 diabetes, and hypertension  RUQ pain and hematemesis. Likely secondary to cholelithiasis and esophageal varices Hemodynamically stable.  Hbg 13.6.  CTA chest/abd/pelvis negative for PE,  cholelithiasis with distended bladder and nodular liver cirrhosis with portal venous hypertension. RUQ positive for portal hypertension.  Asymptomatic. Just returned from EGD procedure. Abdominal exam benign. 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  MELD score 24 19.6% 3 month mortality.  INR 2.0. -Continue Lasix 20 mg daily -Continue Spironolactone 25 mg daily -Continue Rifaximin 550 mg BID -Continue Lactulose 30 g TID, goal 2-3 BM daily -Vit K 18m x 1 po  Type 2 DM A1c 6 (08/21).  CBG overnight 97  Home medication Metformin 750 mg dialy -Continue Metformin   HTN Normotensive overnight.  Home medication Metoprolol 25 mg BID -Restart Metoprolol   H/O CAD/CABG S/p LIMA graft 3 vessel disease.  Home medication Metoprolol and ASA 81 mg.  Transfusion threshold >8 -Continue to hold ASA 81  FEN/GI: NPO,   PPx: SCD's Dispo:Home tomorrow. Barriers include None.   Subjective:  No acute events overnight. Just returned from EGD and a little sleepy.  Reports no hematemesis or abdominal pain   Objective: Temp:  [97.6 F (36.4 C)-98.6 F (37 C)] 98.6 F (37 C) (10/08 0109) Pulse Rate:  [59-79] 78 (10/08 0109) Resp:  [9-21] 18 (10/08 0109) BP: (99-148)/(50-86) 134/67 (10/08 0109) SpO2:  [82 %-98 %] 98 % (10/08 0109) Physical Exam:  General: 65y.o. male in NAD Cardio: RRR no m/r/g Lungs: CTAB, no wheezing, no rhonchi, no crackles, no IWOB on room air Abdomen: Soft, non-tender to palpation, non-distended, positive bowel sounds Skin: warm and dry Extremities: No edema   Laboratory: Recent Labs  Lab 02/15/21 1310 02/15/21 1317 02/16/21 0112  WBC 3.6*  --  5.6  HGB 12.5* 12.2* 13.6  HCT 36.5* 36.0* 39.7  PLT 57*  --  68*   Recent Labs  Lab 02/15/21 1310 02/15/21 1317 02/16/21 0254  NA 134* 138 133*  K 4.4 4.5 4.1  CL 107 106 103  CO2 20*  --  23  BUN 10 11 7*  CREATININE 0.96 1.00 1.09  CALCIUM 8.2*  --  8.1*  PROT 6.6  --  6.5  BILITOT 3.9*  --  5.3*  ALKPHOS 113  --  86  ALT 42  --  42  AST 125*  --  112*  GLUCOSE 127* 128* 97      Imaging/Diagnostic Tests: CT Angio Chest/Abd/Pel for Dissection W and/or W/WO  Result Date: 02/15/2021 CLINICAL DATA:  Abdominal pain, suspected aortic dissection, right upper quadrant pain, vomiting and hematemesis EXAM: CT ANGIOGRAPHY CHEST,  ABDOMEN AND PELVIS TECHNIQUE: Multidetector CT imaging through the chest, abdomen and pelvis was performed using the standard protocol during bolus administration of intravenous contrast. Multiplanar reconstructed images and MIPs were obtained and reviewed to evaluate the vascular anatomy. CONTRAST:  135m OMNIPAQUE IOHEXOL 350 MG/ML SOLN COMPARISON:  None available FINDINGS: CTA CHEST FINDINGS Cardiovascular: Cardiomegaly with mild biatrial enlargement left worse than right. No pericardial effusion.  The RV is nondilated. Satisfactory opacification of pulmonary arteries noted, and there is no evidence of pulmonary emboli. 3 vessel coronary calcifications, post LIMA CABG. Adequate contrast opacification of the thoracic aorta with no evidence of dissection, aneurysm, or stenosis. There is classic 3-vessel brachiocephalic arch anatomy without proximal stenosis. Mild scattered plaque in the arch and descending thoracic aorta. Mediastinum/Nodes: No mass or adenopathy. Limited evaluation for esophageal varices on this arterial phase exam. Lungs/Pleura: Trace right pleural effusion. No pneumothorax. Subcentimeter calcified granuloma in the posterior left upper lobe. Musculoskeletal: Cervical fixation hardware partially visualized. Sternotomy wires. Review of the MIP images confirms the above findings. CTA ABDOMEN AND PELVIS FINDINGS VASCULAR Aorta: Mild calcified plaque.  No aneurysm, dissection, or stenosis. Celiac: Nonocclusive calcified plaque at the origin, patent distally. SMA: Patent without evidence of aneurysm, dissection, vasculitis or significant stenosis. Renals: Single left, with mild atheromatous plaque, no stenosis. Duplicated right, inferior dominant, both patent. IMA: Patent without evidence of aneurysm, dissection, vasculitis or significant stenosis. Inflow: Patent without evidence of aneurysm, dissection, vasculitis or significant stenosis. Veins: Left splenorenal shunt. Small venous collaterals at the umbilicus. Limited assessment for gastric varices given the arterial phase timing of contrast. Review of the MIP images confirms the above findings. NON-VASCULAR Hepatobiliary: Gallbladder is distended with heterogenous wall, partially calcified stones in its dependent aspect. Nodular small liver without focal lesion or biliary ductal dilatation. Pancreas: Unremarkable. No pancreatic ductal dilatation or surrounding inflammatory changes. Spleen: Moderate splenomegaly without evident lesion.  Adrenals/Urinary Tract: Adrenal glands are unremarkable. Kidneys are normal, without renal calculi, focal lesion, or hydronephrosis. Bladder is unremarkable. Stomach/Bowel: The stomach is incompletely distended. Small bowel decompressed. Appendix not discretely identified. The colon is nondilated, unremarkable. Lymphatic: Increased number of subcentimeter retroperitoneal and central mesenteric/epigastric lymph nodes. Reproductive: Prostate normal in size containing coarse calcifications. Other: Trace pelvic, perihepatic and perisplenic ascites. No ascites. Musculoskeletal: Facet and disc degenerative changes L5-S1. No fracture or worrisome bone lesion. Review of the MIP images confirms the above findings. IMPRESSION: 1. Negative for aortic dissection. 2. Cholelithiasis with distended gallbladder. 3. Nodular small liver suggesting cirrhosis, with stigmata of portal venous hypertension including splenomegaly, ascites, and dilated portosystemic collateral venous pathways. Electronically Signed   By: DLucrezia EuropeM.D.   On: 02/15/2021 15:25   [place]domen Limited RUQ (LIVER/GB)  Result Date: 02/15/2021 CLINICAL DATA:  Right upper quadrant pain. EXAM: ULTRASOUND ABDOMEN LIMITED RIGHT UPPER QUADRANT COMPARISON:  CT abdomen pelvis 02/15/2021. FINDINGS: Gallbladder: No gallstones or wall thickening visualized. No sonographic Murphy sign noted by sonographer. Common bile duct: Diameter: 6 mm Liver: Left hepatic lobe not visualized. Nodular hepatic contour. Markedly limited evaluation no focal lesion identified. Within normal limits in parenchymal echogenicity. Portal vein is patent on color Doppler imaging with normal direction of blood flow towards the liver. Other: At least trace volume simple free fluid ascites. . IMPRESSION: 1. Cirrhosis with known portal hypertension. Limited evaluation for focal hepatic lesion. Recommend nonemergent MRI liver protocol for further evaluation. When the patient is clinically stable and  able to follow directions and hold their breath (preferably as an outpatient) further evaluation  with dedicated abdominal MRI should be considered. 2. Left hepatic lobe not visualized due to bowel gas. 3. At least trace volume simple free fluid ascites. Electronically Signed   By: Iven Finn M.D.   On: 02/15/2021 17:20     Carollee Leitz, MD 02/16/2021, 6:16 AM PGY-3, [place] Intern pager: [phone], text pages welcome

## 2021-02-16 NOTE — Op Note (Signed)
[facility] Patient Name: Randall Frazier Procedure Date : 02/16/2021 MRN: [id number] Attending MD: Carol Ada , MD Date of Birth: [birth year] CSN: [id number] Age: 65 Admit Type: Inpatient Procedure:                Upper GI endoscopy Indications:              Hematemesis Providers:                Carol Ada, MD, Grace Isaac, RN, Ladona Ridgel,                            Technician Referring MD:              Medicines:                Propofol per Anesthesia Complications:            No immediate complications. Estimated Blood Loss:     Estimated blood loss: none. Procedure:                Pre-Anesthesia Assessment:                           - Prior to the procedure, a History and Physical                            was performed, and patient medications and                            allergies were reviewed. The patient's tolerance of                            previous anesthesia was also reviewed. The risks                            and benefits of the procedure and the sedation                            options and risks were discussed with the patient.                            All questions were answered, and informed consent                            was obtained. Prior Anticoagulants: The patient has                            taken no previous anticoagulant or antiplatelet                            agents. ASA Grade Assessment: III - A patient with                            severe systemic disease. After reviewing the risks                            and  benefits, the patient was deemed in                            satisfactory condition to undergo the procedure.                           - Sedation was administered by an anesthesia                            professional. Deep sedation was attained.                           After obtaining informed consent, the endoscope was                            passed under direct vision. Throughout the                             procedure, the patient's blood pressure, pulse, and                            oxygen saturations were monitored continuously. The                            [id number]-H190 ([id number]) Olympus endoscope was introduced                            through the mouth, and advanced to the second part                            of duodenum. The upper GI endoscopy was                            accomplished without difficulty. The patient                            tolerated the procedure well. Scope In: Scope Out: Findings:      Small (< 5 mm) varices were found in the middle third of the esophagus       and in the lower third of the esophagus.      Four small angiodysplastic lesions with bleeding were found in the       cardia and in the gastric fundus. Coagulation for hemostasis using       monopolar probe was successful. Estimated blood loss: none.      Severe portal hypertensive gastropathy was found in the entire examined       stomach.      The examined duodenum was normal.      Overall the esophageal varices were small, but it was difficult to       obtain a complete assessment with the esophageal contractions. There       were no suspicious signs of bleeding from the esophageal varices that       warranted any intervention. In the gastric lumen four AVMs were       identified from his portal HTN gastropathy. Two were actively oozing.  All the lesions were ablated with APC. It is not clear if this was the       source of the hematemesis or it was from the epistaxis. I can also be a       combation of the two sources. His current HGB is at 13 g/dL and he is       stable. Impression:               - Small (< 5 mm) esophageal varices.                           - Four bleeding angiodysplastic lesions in the                            stomach. Treated with a monopolar probe.                           - Portal hypertensive gastropathy.                           - Normal  examined duodenum.                           - No specimens collected. Recommendation:           - Return patient to hospital ward for ongoing care.                           - Low sodium diet.                           - Continue present medications.                           - Monitor HGB. Procedure Code(s):        --- Professional ---                           [phone], Esophagogastroduodenoscopy, flexible,                            transoral; with control of bleeding, any method Diagnosis Code(s):        --- Professional ---                           Q22.979, Angiodysplasia of stomach and duodenum                            with bleeding                           I85.00, Esophageal varices without bleeding                           K76.6, Portal hypertension                           K31.89, Other diseases of stomach and duodenum  K92.0, Hematemesis CPT copyright 2019 American Medical Association. All rights reserved. The codes documented in this report are preliminary and upon coder review may  be revised to meet current compliance requirements. Carol Ada, MD Carol Ada, MD 02/16/2021 9:05:21 AM This report has been signed electronically. Number of Addenda: 0

## 2021-02-16 NOTE — Progress Notes (Addendum)
FPTS Interim Progress Note  S:Patient seen and evaluated at bedside for nighttime rounds. He was awake, resting comfortably using his tablet. He had no complaints including nausea, vomiting, hematemesis, dizziness, weakness, abdominal pain. He was very conversational, and pleasant.  O: BP 134/67 (BP Location: Left Arm)   Pulse 78   Temp 98.6 F (37 C) (Oral)   Resp 18   SpO2 98%   General: Well-appearing, in no distress Eyes: Pupils PERRLA, EOMI CV: RRR Lungs: Normal respiratory effort. Normal lung sounds. Abdomen: Soft, non-tender, non-distended Neuro: No focal neurological deficits  A/P: Continue current management per day team Remains hemodynamically stable Remains NPO- possible EGD with GI in morning   PPI, Octreotide infusing   Orvis Brill, DO 02/16/2021, 2:37 AM PGY-1, [place] [facility] Service pager [phone]

## 2021-02-17 ENCOUNTER — Encounter (HOSPITAL_COMMUNITY): Payer: Self-pay | Admitting: Gastroenterology

## 2021-02-17 DIAGNOSIS — K7469 Other cirrhosis of liver: Secondary | ICD-10-CM | POA: Diagnosis not present

## 2021-02-17 DIAGNOSIS — K31811 Angiodysplasia of stomach and duodenum with bleeding: Secondary | ICD-10-CM | POA: Diagnosis not present

## 2021-02-17 LAB — CBC
HCT: 33.6 % — ABNORMAL LOW (ref 39.0–52.0)
Hemoglobin: 11.5 g/dL — ABNORMAL LOW (ref 13.0–17.0)
MCH: 34.8 pg — ABNORMAL HIGH (ref 26.0–34.0)
MCHC: 34.2 g/dL (ref 30.0–36.0)
MCV: 101.8 fL — ABNORMAL HIGH (ref 80.0–100.0)
Platelets: 49 10*3/uL — ABNORMAL LOW (ref 150–400)
RBC: 3.3 MIL/uL — ABNORMAL LOW (ref 4.22–5.81)
RDW: 15 % (ref 11.5–15.5)
WBC: 3.8 10*3/uL — ABNORMAL LOW (ref 4.0–10.5)
nRBC: 0 % (ref 0.0–0.2)

## 2021-02-17 LAB — BASIC METABOLIC PANEL
Anion gap: 4 — ABNORMAL LOW (ref 5–15)
BUN: 9 mg/dL (ref 8–23)
CO2: 28 mmol/L (ref 22–32)
Calcium: 7.8 mg/dL — ABNORMAL LOW (ref 8.9–10.3)
Chloride: 101 mmol/L (ref 98–111)
Creatinine, Ser: 1.26 mg/dL — ABNORMAL HIGH (ref 0.61–1.24)
GFR, Estimated: >60 mL/min (ref >60)
Glucose, Bld: 95 mg/dL (ref 70–99)
Potassium: 4.7 mmol/L (ref 3.5–5.1)
Sodium: 133 mmol/L — ABNORMAL LOW (ref 135–145)

## 2021-02-17 LAB — GLUCOSE, CAPILLARY: Glucose-Capillary: 106 mg/dL — ABNORMAL HIGH (ref 70–99)

## 2021-02-17 MED ORDER — PANTOPRAZOLE SODIUM 40 MG IV SOLR
40.0000 mg | Freq: Two times a day (BID) | INTRAVENOUS | Status: DC
  Administered 2021-02-17: 40 mg via INTRAVENOUS
  Filled 2021-02-17: qty 40

## 2021-02-17 MED ORDER — ASPIRIN EC 81 MG PO TBEC
81.0000 mg | DELAYED_RELEASE_TABLET | Freq: Every day | ORAL | Status: DC
  Administered 2021-02-17 – 2021-02-18 (×2): 81 mg via ORAL
  Filled 2021-02-17 (×2): qty 1

## 2021-02-17 NOTE — Hospital Course (Addendum)
Randall Frazier is a 65 y.o. male who presented with abdominal pain and hematemesis.  PMH significant for NASH cirrhosis, esophageal varices, hepatic encephalopathy, CAD s/p CABG, gout, type 2 diabetes, and hypertension.  Upper GI Bleed Patient presented with 2 episodes of hematemesis. He was hemodynamically stable on admission with normal vitals and Hgb 12.5 (baseline Hgb 13.5). Given his history of decompensated NASH cirrhosis and known esophageal varices, there was concern for variceal bleed. GI consulted and patient started on octreotide and IV protonix. He underwent EGD on 10/8 which showed small angiodysplastic lesions in the stomach as source of bleeding. Intraprocedural hemostasis was achieved. Hgb stable at 13.5 on day of discharge.  NASH Cirrhosis Patient initially placed on Ceftriaxone for SBP prophylaxis. This was discontinued on hospital day 2 as there was no concern for SBP. He also received Vit K x3 due to elevated INR (2.0). MELD score 21 on admission. He was maintained on his home lactulose, rifaximin, lasix, and spironolactone throughout hospitalization. No evidence of hepatic encephalopathy at any point. Follows w/[facility] and is on transplant list at [facility].  CAD s/p CABG Home ASA 76m held on admission due to active bleed. Restarted on hospital day 3 and Hgb remained stable prior to discharge.   PCP Follow-up items Repeat CBC to monitor Hgb and leukopenia (WBC 3.5) Discontinued vitamin E secondary to increased bleeding risk associated with nondiabetic noncirrhotic liver disease Consider switching metoprolol to non-selective beta blocker

## 2021-02-17 NOTE — Progress Notes (Signed)
FPTS Brief Progress Note  S:Patient asleep during night-rounds.    O: BP (!) 115/57 (BP Location: Left Arm)   Pulse 64   Temp 98.8 F (37.1 C) (Oral)   Resp 18   Ht 5' 11"  (1.803 m)   Wt 120.2 kg   SpO2 95% Comment: rechecked  BMI 36.96 kg/m   Gen: sleeping comfortably, in no acute distress.   A/P: NASH  Esophageal Varices Continue current management per day team. Awaiting CBC in AM.  - Orders reviewed. Labs for AM ordered, which was adjusted as needed.   Sharion Settler, DO 02/17/2021, 12:37 AM PGY-2, [place] [facility] Night Resident  Please page [phone] with questions.

## 2021-02-17 NOTE — Plan of Care (Signed)
  Problem: Health Behavior/Discharge Planning: Goal: Ability to manage health-related needs will improve Outcome: Progressing   

## 2021-02-17 NOTE — Plan of Care (Signed)

## 2021-02-17 NOTE — Progress Notes (Signed)
FPTS Interim Progress Note  Discussed case with Dr. Benson Norway from GI. He recommends stopping the octreotide and protonix. Orders updated accordingly.  He agrees with plan to resume ASA 63m. Will check am CBC, anticipate d/c home tomorrow. Appreciate GI's involvement in this patient's care.   AAlcus Dad MD PGY-2 [place]

## 2021-02-17 NOTE — Progress Notes (Signed)
Subjective: No complaints.  Feeling well.  Objective: Vital signs in last 24 hours: Temp:  [98.3 F (36.8 C)-98.9 F (37.2 C)] 98.3 F (36.8 C) (10/09 0829) Pulse Rate:  [56-67] 56 (10/09 0829) Resp:  [15-18] 17 (10/09 0829) BP: (102-115)/(54-57) 110/55 (10/09 0829) SpO2:  [90 %-98 %] 90 % (10/09 0829) Last BM Date: 02/16/21  Intake/Output from previous day: 10/08 0701 - 10/09 0700 In: 882.5 [P.O.:300; I.V.:582.5] Out: -  Intake/Output this shift: No intake/output data recorded.  General appearance: alert and no distress GI: soft, non-tender; bowel sounds normal; no masses,  no organomegaly  Lab Results: Recent Labs    02/15/21 1310 02/15/21 1317 02/16/21 0112 02/17/21 0245  WBC 3.6*  --  5.6 3.8*  HGB 12.5* 12.2* 13.6 11.5*  HCT 36.5* 36.0* 39.7 33.6*  PLT 57*  --  68* 49*   BMET Recent Labs    02/15/21 1310 02/15/21 1317 02/16/21 0254 02/17/21 0245  NA 134* 138 133* 133*  K 4.4 4.5 4.1 4.7  CL 107 106 103 101  CO2 20*  --  23 28  GLUCOSE 127* 128* 97 95  BUN 10 11 7* 9  CREATININE 0.96 1.00 1.09 1.26*  CALCIUM 8.2*  --  8.1* 7.8*   LFT Recent Labs    02/16/21 0254  PROT 6.5  ALBUMIN 2.2*  AST 112*  ALT 42  ALKPHOS 86  BILITOT 5.3*   PT/INR Recent Labs    02/15/21 1310  LABPROT 22.3*  INR 2.0*   Hepatitis Panel No results for input(s): HEPBSAG, HCVAB, HEPAIGM, HEPBIGM in the last 72 hours. C-Diff No results for input(s): CDIFFTOX in the last 72 hours. Fecal Lactopherrin No results for input(s): FECLLACTOFRN in the last 72 hours.  Studies/Results: CT Angio Chest/Abd/Pel for Dissection W and/or W/WO  Result Date: 02/15/2021 CLINICAL DATA:  Abdominal pain, suspected aortic dissection, right upper quadrant pain, vomiting and hematemesis EXAM: CT ANGIOGRAPHY CHEST, ABDOMEN AND PELVIS TECHNIQUE: Multidetector CT imaging through the chest, abdomen and pelvis was performed using the standard protocol during bolus administration of intravenous  contrast. Multiplanar reconstructed images and MIPs were obtained and reviewed to evaluate the vascular anatomy. CONTRAST:  131m OMNIPAQUE IOHEXOL 350 MG/ML SOLN COMPARISON:  None available FINDINGS: CTA CHEST FINDINGS Cardiovascular: Cardiomegaly with mild biatrial enlargement left worse than right. No pericardial effusion. The RV is nondilated. Satisfactory opacification of pulmonary arteries noted, and there is no evidence of pulmonary emboli. 3 vessel coronary calcifications, post LIMA CABG. Adequate contrast opacification of the thoracic aorta with no evidence of dissection, aneurysm, or stenosis. There is classic 3-vessel brachiocephalic arch anatomy without proximal stenosis. Mild scattered plaque in the arch and descending thoracic aorta. Mediastinum/Nodes: No mass or adenopathy. Limited evaluation for esophageal varices on this arterial phase exam. Lungs/Pleura: Trace right pleural effusion. No pneumothorax. Subcentimeter calcified granuloma in the posterior left upper lobe. Musculoskeletal: Cervical fixation hardware partially visualized. Sternotomy wires. Review of the MIP images confirms the above findings. CTA ABDOMEN AND PELVIS FINDINGS VASCULAR Aorta: Mild calcified plaque.  No aneurysm, dissection, or stenosis. Celiac: Nonocclusive calcified plaque at the origin, patent distally. SMA: Patent without evidence of aneurysm, dissection, vasculitis or significant stenosis. Renals: Single left, with mild atheromatous plaque, no stenosis. Duplicated right, inferior dominant, both patent. IMA: Patent without evidence of aneurysm, dissection, vasculitis or significant stenosis. Inflow: Patent without evidence of aneurysm, dissection, vasculitis or significant stenosis. Veins: Left splenorenal shunt. Small venous collaterals at the umbilicus. Limited assessment for gastric varices given the arterial phase  timing of contrast. Review of the MIP images confirms the above findings. NON-VASCULAR Hepatobiliary:  Gallbladder is distended with heterogenous wall, partially calcified stones in its dependent aspect. Nodular small liver without focal lesion or biliary ductal dilatation. Pancreas: Unremarkable. No pancreatic ductal dilatation or surrounding inflammatory changes. Spleen: Moderate splenomegaly without evident lesion. Adrenals/Urinary Tract: Adrenal glands are unremarkable. Kidneys are normal, without renal calculi, focal lesion, or hydronephrosis. Bladder is unremarkable. Stomach/Bowel: The stomach is incompletely distended. Small bowel decompressed. Appendix not discretely identified. The colon is nondilated, unremarkable. Lymphatic: Increased number of subcentimeter retroperitoneal and central mesenteric/epigastric lymph nodes. Reproductive: Prostate normal in size containing coarse calcifications. Other: Trace pelvic, perihepatic and perisplenic ascites. No ascites. Musculoskeletal: Facet and disc degenerative changes L5-S1. No fracture or worrisome bone lesion. Review of the MIP images confirms the above findings. IMPRESSION: 1. Negative for aortic dissection. 2. Cholelithiasis with distended gallbladder. 3. Nodular small liver suggesting cirrhosis, with stigmata of portal venous hypertension including splenomegaly, ascites, and dilated portosystemic collateral venous pathways. Electronically Signed   By: Lucrezia Europe M.Frazier.   On: 02/15/2021 15:25   US Abdomen Limited RUQ (LIVER/GB)  Result Date: 02/15/2021 CLINICAL DATA:  Right upper quadrant pain. EXAM: ULTRASOUND ABDOMEN LIMITED RIGHT UPPER QUADRANT COMPARISON:  CT abdomen pelvis 02/15/2021. FINDINGS: Gallbladder: No gallstones or wall thickening visualized. No sonographic Murphy sign noted by sonographer. Common bile duct: Diameter: 6 mm Liver: Left hepatic lobe not visualized. Nodular hepatic contour. Markedly limited evaluation no focal lesion identified. Within normal limits in parenchymal echogenicity. Portal vein is patent on color Doppler imaging with  normal direction of blood flow towards the liver. Other: At least trace volume simple free fluid ascites. . IMPRESSION: 1. Cirrhosis with known portal hypertension. Limited evaluation for focal hepatic lesion. Recommend nonemergent MRI liver protocol for further evaluation. When the patient is clinically stable and able to follow directions and hold their breath (preferably as an outpatient) further evaluation with dedicated abdominal MRI should be considered. 2. Left hepatic lobe not visualized due to bowel gas. 3. At least trace volume simple free fluid ascites. Electronically Signed   By: Iven Finn M.Frazier.   On: 02/15/2021 17:20    Medications: Scheduled:  furosemide  20 mg Oral Daily   lactulose  30 g Oral TID   [START ON 02/18/2021] metFORMIN  750 mg Oral BID WC   metoprolol tartrate  50 mg Oral BID   montelukast  10 mg Oral QHS   pantoprazole  40 mg Intravenous Q12H   phytonadione  1 mg Oral Daily   rifaximin  550 mg Oral BID   spironolactone  25 mg Oral Daily   Continuous:  octreotide  (SANDOSTATIN)    IV infusion 50 mcg/hr (02/17/21 0211)    Assessment/Plan: 1) Bleeding fundic AVMs s/p APC. 2) Severe portal HTN gastropathy. 3) Anemia.   The patient is well.  His HGB did drop and this may be reflective of equilibration. Plan: 1) Follow HGB. 2) [facility] to resume care in the AM.  LOS: 1 day   Randall Frazier 02/17/2021, 11:51 AM

## 2021-02-17 NOTE — Progress Notes (Addendum)
Family Medicine Teaching Service Daily Progress Note Intern Pager: [phone]  Patient name: Randall Frazier Medical record number: [id number] Date of birth: [birth year] Age: 65 y.o. Gender: male  [facility]: GI Code Status: Full  Pt Overview and Major Events to Date:  10/7: admitted, GI consulted 10/8: EGD showing small (<2m) esophageal varices, 4 small angiodysplastic lesions w/bleeding in the stomach, and severe portal hypertensive gastropathy.  Assessment and Plan:  BKAINEN STRUCKMANis a 65y.o. male presenting with abdominal pain and hematemesis.  PMH significant for NASH cirrhosis, esophageal varices, hepatic encephalopathy, CAD s/p CABG, gout, type 2 diabetes, and hypertension.  Upper GI Bleed EGD revealed multiple small angiodysplastic lesions in the stomach as likely source of bleeding. Hemostasis was achieved.  Patient remains hemodynamically stable with no further episodes of bleeding since admission. Hemoglobin 11.5 this morning, which is a slight drop from his baseline of 13.5, but is overall stable and well above his transfusion threshold of 8.0. -GI following, appreciate recommendations -Remains on Octreotide gtt (will defer to GI, consider d/c) -IV Protonix 44mq12h -Low sodium diet -Vit K 74m54maily x3 doses -Daily CBC  NASH cirrhosis No evidence of SBP (Ceftriaxone was d/c'd) and no concern for hepatic encephalopathy at this time. MELD score 21 on admission. [facility] -Continue home spironolactone and Lasix  Type 2 diabetes Well controlled. Glucose 95-128 throughout admission. -Continue home metformin 750m29mD  HTN Normotensive. BP 110/55 this morning -Continue home metoprolol 25 mg BID -Also on Lasix/Spironolactone as above  CAD s/p CABG ASA held due to active bleed on admission -OK to restart ASA 874mg52mm our  perspective-- will await formal GI recs from today   FEN/GI: 2g sodium diet PPx: SCDs Dispo: Likely home tomorrow. Barriers include ongoing IV treatments.  Subjective:  No acute events overnight. Patient denies complaints this morning. No further abdominal pain or hematemesis since admission.  Objective: Temp:  [97.1 F (36.2 C)-98.9 F (37.2 C)] 98.8 F (37.1 C) (10/08 1937) Pulse Rate:  [64-79] 64 (10/08 1937) Resp:  [15-20] 18 (10/08 1937) BP: (102-146)/(52-66) 115/57 (10/08 1937) SpO2:  [92 %-100 %] 95 % (10/08 1937) Weight:  [120.2 kg] 120.2 kg (10/08 0802) Physical Exam: General: Alert, no acute distress Cardiovascular: RRR, normal S1/S2 Respiratory: Normal work of breathing, lungs CTAB Abdomen: Soft, nontender Extremities: no peripheral edema Neuro: A&Ox3, normal speech, no obvious focal deficits  Laboratory: Recent Labs  Lab 02/15/21 1310 02/15/21 1317 02/16/21 0112 02/17/21 0245  WBC 3.6*  --  5.6 3.8*  HGB 12.5* 12.2* 13.6 11.5*  HCT 36.5* 36.0* 39.7 33.6*  PLT 57*  --  68* 49*   Recent Labs  Lab 02/15/21 1310 02/15/21 1317 02/16/21 0254 02/17/21 0245  NA 134* 138 133* 133*  K 4.4 4.5 4.1 4.7  CL 107 106 103 101  CO2 20*  --  23 28  BUN 10 11 7* 9  CREATININE 0.96 1.00 1.09 1.26*  CALCIUM 8.2*  --  8.1* 7.8*  PROT 6.6  --  6.5  --   BILITOT 3.9*  --  5.3*  --   ALKPHOS 113  --  86  --   ALT 42  --  42  --   AST 125*  --  112*  --   GLUCOSE 127* 128* 97 95     Imaging/Diagnostic Tests: EGD Impression: - Small (< 5  mm) esophageal varices. - Four bleeding angiodysplastic lesions in the stomach. Treated with a monopolar probe. - Portal hypertensive gastropathy. - Normal examined duodenum. - No specimens collected.  Overall the esophageal varices were small, but it was difficult to obtain a complete assessment with the esophageal contractions. There were no suspicious signs of bleeding from the esophageal varices that warranted any  intervention. In the gastric lumen four AVMs were identified from his portal HTN gastropathy. Two were actively oozing. All the lesions were ablated with APC. It is not clear if this was the source of the hematemesis or it was from the epistaxis. I can also be a combation of the two sources.   Alcus Dad, MD 02/17/2021, 7:50 AM PGY-2, [place] Intern pager: [phone], text pages welcome

## 2021-02-18 DIAGNOSIS — K920 Hematemesis: Secondary | ICD-10-CM

## 2021-02-18 DIAGNOSIS — K7469 Other cirrhosis of liver: Secondary | ICD-10-CM | POA: Diagnosis not present

## 2021-02-18 DIAGNOSIS — K31811 Angiodysplasia of stomach and duodenum with bleeding: Secondary | ICD-10-CM | POA: Diagnosis not present

## 2021-02-18 LAB — BASIC METABOLIC PANEL
Anion gap: 5 (ref 5–15)
BUN: 9 mg/dL (ref 8–23)
CO2: 28 mmol/L (ref 22–32)
Calcium: 7.9 mg/dL — ABNORMAL LOW (ref 8.9–10.3)
Chloride: 100 mmol/L (ref 98–111)
Creatinine, Ser: 1.25 mg/dL — ABNORMAL HIGH (ref 0.61–1.24)
GFR, Estimated: >60 mL/min (ref >60)
Glucose, Bld: 99 mg/dL (ref 70–99)
Potassium: 4.6 mmol/L (ref 3.5–5.1)
Sodium: 133 mmol/L — ABNORMAL LOW (ref 135–145)

## 2021-02-18 LAB — GLUCOSE, CAPILLARY: Glucose-Capillary: 84 mg/dL (ref 70–99)

## 2021-02-18 LAB — CBC
HCT: 34.1 % — ABNORMAL LOW (ref 39.0–52.0)
Hemoglobin: 11.8 g/dL — ABNORMAL LOW (ref 13.0–17.0)
MCH: 35.1 pg — ABNORMAL HIGH (ref 26.0–34.0)
MCHC: 34.6 g/dL (ref 30.0–36.0)
MCV: 101.5 fL — ABNORMAL HIGH (ref 80.0–100.0)
Platelets: 49 10*3/uL — ABNORMAL LOW (ref 150–400)
RBC: 3.36 MIL/uL — ABNORMAL LOW (ref 4.22–5.81)
RDW: 14.7 % (ref 11.5–15.5)
WBC: 3.5 10*3/uL — ABNORMAL LOW (ref 4.0–10.5)
nRBC: 0 % (ref 0.0–0.2)

## 2021-02-18 NOTE — Progress Notes (Addendum)
FPTS Brief Progress Note  S:Patient awake and watching tablet at time of visit. He denies any additional needs at this time. Patient reports he is on liver transplant list, has been able to get his blood pressure and diabetes under control thanks to the efforts of his wife and is thankful for treatment team's care during this admission. Patient reports abdominal pain has resolved.    O: BP 118/61 (BP Location: Right Arm)   Pulse (!) 55   Temp 98 F (36.7 C) (Oral)   Resp 20   Ht 5' 11"  (1.803 m)   Wt 120.2 kg   SpO2 92%   BMI 36.96 kg/m   Gen: alert & oriented male, sitting upright in hospital bed, NAD, conversant  Pulm: speaking clear sentences on RA with normal WOB   A/P: Patient is stable and without pain.  No new additions to therapy at this time   Eulis Foster, MD 02/18/2021, 4:39 AM PGY-3, [facility] Night Resident  Please page [phone] with questions.

## 2021-02-18 NOTE — Discharge Instructions (Signed)
Dear Randall Frazier,   Thank you for letting us participate in your care! In this section, you will find a brief summary of why you were admitted to the hospital, what happened during your admission, your diagnosis/diagnoses, and recommended follow up.  You were admitted because you were vomiting blood and experiencing abdominal pain.  Your testing revealed some small areas of bleeding in your stomach, which were cauterized during your endoscopy. You were also seen by the GI specialists while you were here. Your blood levels remained stable and you were discharged from the hospital for meeting this goal.    POST-HOSPITAL & CARE INSTRUCTIONS Please see medications section of this packet for any medication changes.  Please let PCP/Specialists know of any changes in medications that were made.   DOCTOR'S APPOINTMENTS & FOLLOW UP No future appointments.   Thank you for choosing [facility]! Take care and be well!  [facility]  [place], [place] [address] [phone]

## 2021-02-18 NOTE — Progress Notes (Signed)
Family Medicine Teaching Service Daily Progress Note Intern Pager: [phone]  Patient name: Randall Frazier Medical record number: [id number] Date of birth: 1956/04/28 Age: 65 y.o. Gender: male  Primary Care Provider: [facility] Consultants: GI Code Status: Full  Pt Overview and Major Events to Date:  10/7: Admitted, GI consulted 10/8:EGD showing small (<17m) esophageal varices, 4 small angiodysplastic lesions w/bleeding in the stomach, and severe portal hypertensive gastropathy.  Assessment and Plan: Randall BARTHOLOMEWis a 65y.o. male presenting with abdominal pain and hematemesis.  PMH significant for NASH cirrhosis, esophageal varices, hepatic encephalopathy, CAD s/p CABG, gout, type 2 diabetes, and hypertension.  Upper GI bleed Patient remains hemodynamically stable with no further bleeding's episodes since admission.  Hemoglobin 11.8 this morning, decreased from baseline of 13.5 but overall stable and above transfusion threshold of 8.0. -Octreotide and Protonix D/C -Low-sodium diet -Give vitamin K 1 mg daily x3 doses  NASH cirrhosis No evidence of SBP.  No hepatic encephalopathy at this time.  Meld score 21 on admission.  [facility]  Followed outpatient by [facility] transplant hepatology. -Continue home lactulose, rifaximin -Continue spironolactone and Lasix  Type 2 diabetes Well-controlled.  Glucose 99 this morning.  CBG ranges 95-1 28 throughout admission -Continue home metformin 750 mg twice daily  Hypertension BP 117/70 this morning.  Blood pressure well controlled throughout admission. Continue home metoprolol 25 mg twice daily -Continue Lasix/spironolactone as above  CAD s/p CABG Restart ASA 81 mg, per GI agreement.   FEN/GI: 2 g sodium diet PPx: SCDs Dispo:Home today. Barriers include none.   Subjective:  No acute events overnight.  He denies any pain or other complaints this morning.  He denies cough, shortness of breath, edema or  swelling.  No further episodes of hematemesis since admission.  He had questions regarding his low RBC count for which he pulled up his MyChart on his phone.  I explained to him that there is no concerned about that at this time, considering his hemoglobin is stable and he is well perfused without any symptoms of shortness of breath, dizziness, weakness.  Objective: Temp:  [98 F (36.7 C)-98.3 F (36.8 C)] 98 F (36.7 C) (10/09 2124) Pulse Rate:  [55-56] 55 (10/09 2124) Resp:  [17-20] 20 (10/09 2124) BP: (103-118)/(55-61) 118/61 (10/09 2124) SpO2:  [90 %-92 %] 92 % (10/09 2124) Physical Exam: General: Awake alert, in no acute distress, pleasant and conversational Cardiovascular: Regular rate and rhythm, no murmurs appreciated Respiratory: Normal work of breathing.  Mild crackles heard in bilateral bases.  No wheezing Abdomen: Soft, nontender Extremities: ANO x3, speech clear and fluent.  No focal neurological deficits  Laboratory: Recent Labs  Lab 02/16/21 0112 02/17/21 0245 02/18/21 0123  WBC 5.6 3.8* 3.5*  HGB 13.6 11.5* 11.8*  HCT 39.7 33.6* 34.1*  PLT 68* 49* 49*   Recent Labs  Lab 02/15/21 1310 02/15/21 1317 02/16/21 0254 02/17/21 0245 02/18/21 0123  NA 134*   < > 133* 133* 133*  K 4.4   < > 4.1 4.7 4.6  CL 107   < > 103 101 100  CO2 20*  --  23 28 28   BUN 10   < > 7* 9 9  CREATININE 0.96   < > 1.09 1.26* 1.25*  CALCIUM 8.2*  --  8.1* 7.8* 7.9*  PROT 6.6  --  6.5  --   --   BILITOT 3.9*  --  5.3*  --   --  ALKPHOS 113  --  86  --   --   ALT 42  --  42  --   --   AST 125*  --  112*  --   --   GLUCOSE 127*   < > 97 95 99   < > = values in this interval not displayed.    Imaging/Diagnostic Tests: No results found.   Orvis Brill, DO 02/18/2021, 7:04 AM PGY-1, [place] Intern pager: [phone], text pages welcome

## 2021-02-18 NOTE — Progress Notes (Signed)
    Gastroenterology Inpatient Follow Up    Subjective: - Denies bleeding. Having BMs. Abdominal pain has resolved. Swelling in legs is stable. Denies confusion - Hb stable  Objective: Vital signs in last 24 hours: Temp:  [98 F (36.7 C)-98.5 F (36.9 C)] 98.5 F (36.9 C) (10/10 0810) Pulse Rate:  [54-55] 54 (10/10 0810) Resp:  [18-20] 18 (10/10 0810) BP: (103-118)/(57-70) 117/70 (10/10 0810) SpO2:  [90 %-92 %] 90 % (10/10 0810) Last BM Date: 02/17/21  Intake/Output from previous day: No intake/output data recorded. Intake/Output this shift: Total I/O In: 240 [P.O.:240] Out: -   General appearance: alert and cooperative Resp: clear to auscultation bilaterally Cardio: regular rate GI: soft, non-tender; bowel sounds normal; no masses,  no organomegaly Extremities: Trace BLE edema Neuro: No asterixis  Lab Results: Recent Labs    02/16/21 0112 02/17/21 0245 02/18/21 0123  WBC 5.6 3.8* 3.5*  HGB 13.6 11.5* 11.8*  HCT 39.7 33.6* 34.1*  PLT 68* 49* 49*   BMET Recent Labs    02/16/21 0254 02/17/21 0245 02/18/21 0123  NA 133* 133* 133*  K 4.1 4.7 4.6  CL 103 101 100  CO2 23 28 28   GLUCOSE 97 95 99  BUN 7* 9 9  CREATININE 1.09 1.26* 1.25*  CALCIUM 8.1* 7.8* 7.9*   LFT Recent Labs    02/16/21 0254  PROT 6.5  ALBUMIN 2.2*  AST 112*  ALT 42  ALKPHOS 86  BILITOT 5.3*   PT/INR Recent Labs    02/15/21 1310  LABPROT 22.3*  INR 2.0*   Hepatitis Panel No results for input(s): HEPBSAG, HCVAB, HEPAIGM, HEPBIGM in the last 72 hours. C-Diff No results for input(s): CDIFFTOX in the last 72 hours.  Studies/Results: No results found.  Medications: Scheduled:  aspirin EC  81 mg Oral Daily   furosemide  20 mg Oral Daily   lactulose  30 g Oral TID   metFORMIN  750 mg Oral BID WC   metoprolol tartrate  50 mg Oral BID   montelukast  10 mg Oral QHS   rifaximin  550 mg Oral BID   spironolactone  25 mg Oral Daily   Continuous: ONG:[id number]  alcohol  Assessment/Plan: 65 year old male with history of NASH cirrhosis presented with abdominal pain and hematemesis. EGD 10/8 w/small EV, four angiodysplastic lesions s/p APC, severe PHG. Patient's Hb is stable from yesterday. His abdominal pain has resolved, has not required pain medications for several days.   - Continue lactulose, rifaximin, lasix, spironolactone - Low sodium diet <2 g/day - Agree with Vit K x 3 days - Start daily pantoprazole 20 mg QD to try to prevent future GI bleeding episodes in setting of known severe PHG - Consider switching from metoprolol to nonselective beta blocker in the future - Follows with hepatologists at [facility] and [place] will sign off for now. Please call if any new questions arise.   LOS: 2 days   Sharyn Creamer 02/18/2021, 11:32 AM

## 2021-02-18 NOTE — Progress Notes (Signed)
   02/18/21 0730  Clinical Encounter Type  Visited With Patient  Visit Type Initial;Spiritual support;Social support  Referral From Chaplain  Consult/Referral To [employer] provided emotional and spiritual support.  PT offered words of prayer, followed by words of comfort for peace. Chaplain ministered with a calm presence and reflective listening. Chaplain supported PT in his lived theology of "God being in control".   Andee Poles, M. Div.

## 2021-02-18 NOTE — Discharge Summary (Signed)
[facility] Discharge Summary  Patient name: Randall Frazier Medical record number: [id number] Date of birth: 03-May-1956 Age: 65 y.o. Gender: male Date of Admission: 02/15/2021  Date of Discharge: 02/18/2021 Admitting Physician: Precious Gilding, DO  Primary Care Provider: [facility] Consultants: Gastroenterology  Indication for Hospitalization: Hematemesis  Discharge Diagnoses/Problem List:  Active Problems:   GI bleed   GIB (gastrointestinal bleeding)   Other cirrhosis of liver ([place])   Hematemesis with nausea   Disposition: Home  Discharge Condition: Stable  Discharge Exam:  General: Awake alert, in no acute distress, pleasant and conversational Cardiovascular: Regular rate and rhythm, no murmurs appreciated Respiratory: Normal work of breathing.  Mild crackles heard in bilateral bases.  No wheezing Abdomen: Soft, nontender Extremities: ANO x3, speech clear and fluent.  No focal neurological deficits  Brief Hospital Course:  Randall Frazier is a 65 y.o. male who presented with abdominal pain and hematemesis.  PMH significant for NASH cirrhosis, esophageal varices, hepatic encephalopathy, CAD s/p CABG, gout, type 2 diabetes, and hypertension.  Upper GI Bleed Patient presented with 2 episodes of hematemesis. He was hemodynamically stable on admission with normal vitals and Hgb 12.5 (baseline Hgb 13.5). Given his history of decompensated NASH cirrhosis and known esophageal varices, there was concern for variceal bleed. GI consulted and patient started on octreotide and IV protonix. He underwent EGD on 10/8 which showed small angiodysplastic lesions in the stomach as source of bleeding. Intraprocedural hemostasis was achieved. Hgb stable at 13.5 on day of discharge.  NASH Cirrhosis Patient initially placed on Ceftriaxone for SBP prophylaxis. This was discontinued on hospital day 2 as there was no concern for SBP. He also received Vit K x3 due to  elevated INR (2.0). MELD score 21 on admission. He was maintained on his home lactulose, rifaximin, lasix, and spironolactone throughout hospitalization. No evidence of hepatic encephalopathy at any point. Follows w/[facility] and is on transplant list at [facility].  CAD s/p CABG Home ASA 90m held on admission due to active bleed. Restarted on hospital day 3 and Hgb remained stable prior to discharge.   PCP Follow-up items Repeat CBC to monitor Hgb and leukopenia (WBC 3.5) Discontinued vitamin E secondary to increased bleeding risk associated with nondiabetic noncirrhotic liver disease Consider switching metoprolol to non-selective beta blocker   Significant Procedures: EGD  Significant Labs and Imaging:  Recent Labs  Lab 02/16/21 0112 02/17/21 0245 02/18/21 0123  WBC 5.6 3.8* 3.5*  HGB 13.6 11.5* 11.8*  HCT 39.7 33.6* 34.1*  PLT 68* 49* 49*   Recent Labs  Lab 02/15/21 1310 02/15/21 1317 02/16/21 0254 02/17/21 0245 02/18/21 0123  NA 134* 138 133* 133* 133*  K 4.4 4.5 4.1 4.7 4.6  CL 107 106 103 101 100  CO2 20*  --  23 28 28   GLUCOSE 127* 128* 97 95 99  BUN 10 11 7* 9 9  CREATININE 0.96 1.00 1.09 1.26* 1.25*  CALCIUM 8.2*  --  8.1* 7.8* 7.9*  ALKPHOS 113  --  86  --   --   AST 125*  --  112*  --   --   ALT 42  --  42  --   --   ALBUMIN 2.3*  --  2.2*  --   --     Results/Tests Pending at Time of Discharge: None  Discharge Medications:  Allergies as of 02/18/2021       Reactions   Meloxicam Other (See Comments)  Cannot take per MD ( awaiting on liver transplant)   Pork-derived Products Other (See Comments)   Cannot eat- gout   Statins Other (See Comments)   Cramps   Trazodone And Nefazodone Nausea Only, Other (See Comments)   Caused patient to have a lowered level of tolerance, too   Lisinopril Rash   Niacin Rash   Niacin And Related Rash        Medication List     STOP taking these medications    vitamin E 180 MG (400 UNITS)  capsule       TAKE these medications    allopurinol 100 MG tablet Commonly known as: ZYLOPRIM Take 100 mg by mouth as needed (AS DIRECTED, FOR GOUT FLARES).   aspirin EC 81 MG tablet Take 81 mg by mouth daily. Swallow whole.   augmented betamethasone dipropionate 0.05 % cream Commonly known as: DIPROLENE-AF Apply 1 application topically 2 (two) times daily.   diphenhydrAMINE 25 mg capsule Commonly known as: BENADRYL Take 25 mg by mouth every 6 (six) hours as needed for itching.   furosemide 20 MG tablet Commonly known as: LASIX Take 20 mg by mouth daily.   lactulose 10 GM/15ML solution Commonly known as: CHRONULAC Take 45 mLs (30 g total) by mouth 3 (three) times daily. Hold for more than 3 large BM's a day.   loratadine 10 MG tablet Commonly known as: CLARITIN Take 10 mg by mouth daily as needed for allergies or rhinitis.   Lubricating Plus Eye Drops 0.5 % Soln Generic drug: Carboxymethylcellulose Sod PF Place 1 drop into both eyes 3 (three) times daily as needed (for irritation).   metFORMIN 500 MG tablet Commonly known as: GLUCOPHAGE Take 750 mg by mouth 2 (two) times daily with a meal. Taking 1 & 1/2 tabs = 750 mg twice daily   metoprolol tartrate 50 MG tablet Commonly known as: LOPRESSOR Take 50 mg by mouth 2 (two) times daily.   montelukast 10 MG tablet Commonly known as: SINGULAIR Take 10 mg by mouth at bedtime.   pantoprazole 40 MG tablet Commonly known as: PROTONIX Take 40 mg by mouth daily.   spironolactone 25 MG tablet Commonly known as: ALDACTONE Take 25 mg by mouth daily.   THERAPEUTIC SHAMPOO 0.5 % shampoo Generic drug: coal tar Apply 1 application topically See admin instructions. Shampoo at least 4 times a week   Vitamin D3 25 MCG (1000 UT) Caps Take 1,000 Units by mouth daily.   Xifaxan 550 MG Tabs tablet Generic drug: rifaximin Take 550 mg by mouth 2 (two) times daily.        Discharge Instructions: Please refer to Patient  Instructions section of EMR for full details.  Patient was counseled important signs and symptoms that should prompt return to medical care, changes in medications, dietary instructions, activity restrictions, and follow up appointments.   Follow-Up Appointments:  Follow-up [place] [facility] in 1 day(s).   Specialty: General Practice Contact information: [place] [address] [phone]                 Orvis Brill, DO 02/18/2021, 4:08 PM PGY-1, [place]

## 2021-03-23 ENCOUNTER — Other Ambulatory Visit: Payer: Self-pay

## 2021-03-23 ENCOUNTER — Emergency Department (HOSPITAL_COMMUNITY)
Admission: EM | Admit: 2021-03-23 | Discharge: 2021-03-24 | Disposition: A | Discharge status: Home / Self Care | Payer: No Typology Code available for payment source | Attending: Emergency Medicine | Admitting: Emergency Medicine

## 2021-03-23 ENCOUNTER — Emergency Department (HOSPITAL_COMMUNITY): Payer: No Typology Code available for payment source

## 2021-03-23 DIAGNOSIS — R0602 Shortness of breath: Secondary | ICD-10-CM | POA: Diagnosis not present

## 2021-03-23 DIAGNOSIS — R001 Bradycardia, unspecified: Secondary | ICD-10-CM | POA: Diagnosis not present

## 2021-03-23 DIAGNOSIS — N179 Acute kidney failure, unspecified: Secondary | ICD-10-CM | POA: Diagnosis not present

## 2021-03-23 DIAGNOSIS — Z79899 Other long term (current) drug therapy: Secondary | ICD-10-CM | POA: Insufficient documentation

## 2021-03-23 DIAGNOSIS — I2510 Atherosclerotic heart disease of native coronary artery without angina pectoris: Secondary | ICD-10-CM | POA: Insufficient documentation

## 2021-03-23 DIAGNOSIS — I119 Hypertensive heart disease without heart failure: Secondary | ICD-10-CM | POA: Diagnosis not present

## 2021-03-23 DIAGNOSIS — R17 Unspecified jaundice: Secondary | ICD-10-CM | POA: Diagnosis not present

## 2021-03-23 DIAGNOSIS — Z20822 Contact with and (suspected) exposure to covid-19: Secondary | ICD-10-CM | POA: Diagnosis not present

## 2021-03-23 DIAGNOSIS — Z7984 Long term (current) use of oral hypoglycemic drugs: Secondary | ICD-10-CM | POA: Diagnosis not present

## 2021-03-23 DIAGNOSIS — R188 Other ascites: Secondary | ICD-10-CM

## 2021-03-23 DIAGNOSIS — E871 Hypo-osmolality and hyponatremia: Secondary | ICD-10-CM | POA: Diagnosis not present

## 2021-03-23 DIAGNOSIS — Z7982 Long term (current) use of aspirin: Secondary | ICD-10-CM | POA: Insufficient documentation

## 2021-03-23 DIAGNOSIS — E119 Type 2 diabetes mellitus without complications: Secondary | ICD-10-CM | POA: Insufficient documentation

## 2021-03-23 DIAGNOSIS — R140 Abdominal distension (gaseous): Secondary | ICD-10-CM | POA: Diagnosis not present

## 2021-03-23 LAB — CBC WITH DIFFERENTIAL/PLATELET
Abs Immature Granulocytes: 0.4 10*3/uL — ABNORMAL HIGH (ref 0.00–0.07)
Basophils Absolute: 0.1 10*3/uL (ref 0.0–0.1)
Basophils Relative: 1 %
Eosinophils Absolute: 0.3 10*3/uL (ref 0.0–0.5)
Eosinophils Relative: 4 %
HCT: 37.7 % — ABNORMAL LOW (ref 39.0–52.0)
Hemoglobin: 13.5 g/dL (ref 13.0–17.0)
Immature Granulocytes: 5 %
Lymphocytes Relative: 12 %
Lymphs Abs: 1 10*3/uL (ref 0.7–4.0)
MCH: 34.7 pg — ABNORMAL HIGH (ref 26.0–34.0)
MCHC: 35.8 g/dL (ref 30.0–36.0)
MCV: 96.9 fL (ref 80.0–100.0)
Monocytes Absolute: 1.3 10*3/uL — ABNORMAL HIGH (ref 0.1–1.0)
Monocytes Relative: 16 %
Neutro Abs: 4.9 10*3/uL (ref 1.7–7.7)
Neutrophils Relative %: 62 %
Platelets: 39 10*3/uL — ABNORMAL LOW (ref 150–400)
RBC: 3.89 MIL/uL — ABNORMAL LOW (ref 4.22–5.81)
RDW: 18.5 % — ABNORMAL HIGH (ref 11.5–15.5)
WBC: 8 10*3/uL (ref 4.0–10.5)
nRBC: 0.6 % — ABNORMAL HIGH (ref 0.0–0.2)

## 2021-03-23 LAB — COMPREHENSIVE METABOLIC PANEL
ALT: 56 U/L — ABNORMAL HIGH (ref 0–44)
AST: 165 U/L — ABNORMAL HIGH (ref 15–41)
Albumin: 1.9 g/dL — ABNORMAL LOW (ref 3.5–5.0)
Alkaline Phosphatase: 172 U/L — ABNORMAL HIGH (ref 38–126)
Anion gap: 12 (ref 5–15)
BUN: 72 mg/dL — ABNORMAL HIGH (ref 8–23)
CO2: 17 mmol/L — ABNORMAL LOW (ref 22–32)
Calcium: 8.7 mg/dL — ABNORMAL LOW (ref 8.9–10.3)
Chloride: 91 mmol/L — ABNORMAL LOW (ref 98–111)
Creatinine, Ser: 5.76 mg/dL — ABNORMAL HIGH (ref 0.61–1.24)
GFR, Estimated: 10 mL/min — ABNORMAL LOW (ref >60)
Glucose, Bld: 79 mg/dL (ref 70–99)
Potassium: 4.1 mmol/L (ref 3.5–5.1)
Sodium: 120 mmol/L — ABNORMAL LOW (ref 135–145)
Total Bilirubin: 33.1 mg/dL (ref 0.3–1.2)
Total Protein: 6.9 g/dL (ref 6.5–8.1)

## 2021-03-23 LAB — I-STAT CHEM 8, ED
BUN: 71 mg/dL — ABNORMAL HIGH (ref 8–23)
Calcium, Ion: 1.02 mmol/L — ABNORMAL LOW (ref 1.15–1.40)
Chloride: 94 mmol/L — ABNORMAL LOW (ref 98–111)
Creatinine, Ser: 6.6 mg/dL — ABNORMAL HIGH (ref 0.61–1.24)
Glucose, Bld: 75 mg/dL (ref 70–99)
HCT: 40 % (ref 39.0–52.0)
Hemoglobin: 13.6 g/dL (ref 13.0–17.0)
Potassium: 4.3 mmol/L (ref 3.5–5.1)
Sodium: 124 mmol/L — ABNORMAL LOW (ref 135–145)
TCO2: 19 mmol/L — ABNORMAL LOW (ref 22–32)

## 2021-03-23 LAB — URINALYSIS, ROUTINE W REFLEX MICROSCOPIC
Glucose, UA: NEGATIVE mg/dL
Ketones, ur: NEGATIVE mg/dL
Leukocytes,Ua: NEGATIVE
Nitrite: NEGATIVE
Protein, ur: 30 mg/dL — AB
Specific Gravity, Urine: 1.017 (ref 1.005–1.030)
pH: 5 (ref 5.0–8.0)

## 2021-03-23 LAB — PROTIME-INR
INR: 2.4 — ABNORMAL HIGH (ref 0.8–1.2)
Prothrombin Time: 25.9 seconds — ABNORMAL HIGH (ref 11.4–15.2)

## 2021-03-23 LAB — APTT: aPTT: 53 seconds — ABNORMAL HIGH (ref 24–36)

## 2021-03-23 LAB — ETHANOL: Alcohol, Ethyl (B): <10 mg/dL (ref <10)

## 2021-03-23 LAB — AMMONIA: Ammonia: 23 umol/L (ref 9–35)

## 2021-03-23 LAB — RESP PANEL BY RT-PCR (FLU A&B, COVID) ARPGX2
Influenza A by PCR: NEGATIVE
Influenza B by PCR: NEGATIVE
SARS Coronavirus 2 by RT PCR: NEGATIVE

## 2021-03-23 LAB — LIPASE, BLOOD: Lipase: 56 U/L — ABNORMAL HIGH (ref 11–51)

## 2021-03-23 MED ORDER — ONDANSETRON HCL 4 MG/2ML IJ SOLN
INTRAMUSCULAR | Status: AC
  Administered 2021-03-23: 4 mg
  Filled 2021-03-23: qty 2

## 2021-03-23 MED ORDER — OCTREOTIDE ACETATE 100 MCG/ML IJ SOLN
200.0000 ug | Freq: Three times a day (TID) | INTRAMUSCULAR | Status: DC
  Administered 2021-03-23: 200 ug via SUBCUTANEOUS
  Filled 2021-03-23: qty 2
  Filled 2021-03-23 (×2): qty 4

## 2021-03-23 MED ORDER — MIDODRINE HCL 5 MG PO TABS
10.0000 mg | ORAL_TABLET | Freq: Three times a day (TID) | ORAL | Status: DC
  Administered 2021-03-23: 10 mg via ORAL
  Filled 2021-03-23: qty 2

## 2021-03-23 MED ORDER — PANTOPRAZOLE SODIUM 40 MG IV SOLR
40.0000 mg | Freq: Once | INTRAVENOUS | Status: AC
  Administered 2021-03-23: 40 mg via INTRAVENOUS
  Filled 2021-03-23: qty 40

## 2021-03-23 MED ORDER — ALBUMIN HUMAN 25 % IV SOLN
25.0000 g | Freq: Three times a day (TID) | INTRAVENOUS | Status: DC
  Administered 2021-03-23: 12.5 g via INTRAVENOUS
  Filled 2021-03-23: qty 100

## 2021-03-23 NOTE — ED Provider Notes (Signed)
[facility] EMERGENCY DEPARTMENT Provider Note   CSN: [id number] Arrival date & time: 03/23/21  1255     History Chief Complaint  Patient presents with   Shortness of Breath    Randall Frazier is a 65 y.o. male with two days of inability to urinate. Patient states he is currently being seen for liver failure at [facility] in [place] and is awaiting liver transplant. Patient is extremely jaundiced on examination but patient states current level of jaundice is only slightly worsened from his baseline. Patient continues that his abdomen is extremely distended and rigid, which he states is abnormal for him and began around the same time as his inability to urinate. Patient endorses shortness of breath. Patient denies nausea, vomiting, diarrhea, fever, chest pain, or abdominal pain.    Shortness of Breath Associated symptoms: no abdominal pain, no chest pain, no fever and no vomiting       Past Medical History:  Diagnosis Date   Arthritis    shoulder- both, neck, spine, GOUT   Diabetes mellitus    Gout    H/O exercise stress test    many yrs. 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 weight loss -over 18 mon. period     Patient Active Problem List   Diagnosis Date Noted   Hematemesis with nausea    GIB (gastrointestinal bleeding) 02/16/2021   Other cirrhosis of liver ([place])    GI bleed 02/15/2021   Hepatic encephalopathy 02/19/2020   Portal hypertension with esophageal varices ([place]) 12/16/2019   Duodenal ulcer 12/16/2019   Gastric ulcer 12/16/2019   Gastritis 12/16/2019   DM2 (diabetes mellitus, type 2) ([place]) 12/15/2019   Decompensated hepatic cirrhosis ([place]) 12/15/2019   CAD (coronary artery disease) 12/15/2019   Hyperlipemia 12/15/2019   Essential hypertension 12/15/2019   Dyspnea 12/15/2019   Thrombocytopenia ([place]) 12/15/2019   AKI (acute kidney injury) ([place]) 12/15/2019    Hypoalbuminemia 12/15/2019   Acute hepatic encephalopathy 12/15/2019   Hyponatremia 12/15/2019   Cellulitis of right leg 12/15/2019   Melena 12/14/2019   Cervical spondylosis without myelopathy 12/20/2012    Class: Diagnosis of   Biliary dyskinesia 10/05/2012    Past Surgical History:  Procedure Laterality Date   ANTERIOR CERVICAL DECOMP/DISCECTOMY FUSION N/A 12/20/2012   Procedure: C5-6, C6-7 ANTERIOR CERVICAL DISCECTOMY AND FUSION, ALLOGRAFT, PLATE;  Surgeon: Marybelle Killings, MD;  Location: [facility];  Service: Orthopedics;  Laterality: N/A;   ESOPHAGOGASTRODUODENOSCOPY (EGD) WITH PROPOFOL Left 12/16/2019   Procedure: ESOPHAGOGASTRODUODENOSCOPY (EGD) WITH PROPOFOL;  Surgeon: Wilford Corner, MD;  Location: [facility];  Service: Endoscopy;  Laterality: Left;   ESOPHAGOGASTRODUODENOSCOPY (EGD) WITH PROPOFOL N/A 02/16/2021   Procedure: ESOPHAGOGASTRODUODENOSCOPY (EGD) WITH PROPOFOL;  Surgeon: Carol Ada, MD;  Location: [facility];  Service: Endoscopy;  Laterality: N/A;   HOT HEMOSTASIS N/A 02/16/2021   Procedure: HOT HEMOSTASIS (ARGON PLASMA COAGULATION/BICAP);  Surgeon: Carol Ada, MD;  Location: [facility];  Service: Endoscopy;  Laterality: N/A;   KNEE SURGERY     osgood slaughter syndrome , 18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 Authorizing Provider  aspirin EC 81 MG tablet Take 81 mg by mouth daily. Swallow whole.   Yes [provider]  augmented betamethasone dipropionate (DIPROLENE-AF) 0.05 % cream Apply 1 application topically 2 (two) times daily. 02/01/20  Yes [provider]  Cholecalciferol (VITAMIN D3) 25  MCG (1000 UT) CAPS Take 1,000 Units by mouth daily.   Yes [provider]  diphenhydrAMINE (BENADRYL) 25 mg capsule Take 25 mg by mouth every 6 (six) hours as needed for itching.    Yes [provider]  furosemide (LASIX) 20 MG tablet Take 20 mg by mouth daily. 12/12/20  Yes [provider]  lactulose (CHRONULAC) 10 GM/15ML solution Take 45 mLs (30 g total) by mouth 3 (three) times daily. Hold for more than 3 large BM's a day. 02/22/20  Yes Swayze, Ava, DO  LUBRICATING PLUS EYE DROPS 0.5 % SOLN Place 1 drop into both eyes 3 (three) times daily as needed (for irritation).  10/05/19  Yes [provider]  metFORMIN (GLUCOPHAGE) 500 MG tablet Take 500 mg by mouth 2 (two) times daily with a meal.   Yes [provider]  metoprolol tartrate (LOPRESSOR) 50 MG tablet Take 50 mg by mouth 2 (two) times daily. 09/04/20  Yes [provider]  montelukast (SINGULAIR) 10 MG tablet Take 10 mg by mouth at bedtime.   Yes [provider]  pantoprazole (PROTONIX) 40 MG tablet Take 40 mg by mouth daily as needed (heartburn, indigestion). 12/19/19  Yes [provider]  spironolactone (ALDACTONE) 25 MG tablet Take 25 mg by mouth daily.    Yes [provider]  THERAPEUTIC SHAMPOO 0.5 % shampoo Apply 1 application topically See admin instructions. Shampoo at least 4 times a week 10/05/19  Yes [provider]  XIFAXAN 550 MG TABS tablet Take 550 mg by mouth 2 (two) times daily. 12/12/20  Yes [provider]    Allergies    Gabapentin, Meloxicam, Pork-derived products, Statins, Trazodone and nefazodone, Lisinopril, Niacin, and Niacin and related  Review of Systems   Review of Systems  Constitutional:  Negative for fever.  Respiratory:  Positive for shortness of breath.   Cardiovascular:  Negative for chest pain.  Gastrointestinal:  Positive for abdominal distention. Negative for abdominal pain, diarrhea, nausea and vomiting.  Skin:   Positive for color change.  All other systems reviewed and are negative.  Physical Exam Updated Vital Signs BP (!) 102/55   Pulse (!) 45   Temp (!) 97.1 F (36.2 C) (Axillary)   Resp (!) 21   Ht 6' (1.829 m)   Wt 127.5 kg   SpO2 99%   BMI 38.11 kg/m   Physical Exam Constitutional:      Appearance: He is ill-appearing.  HENT:     Head: Normocephalic.  Cardiovascular:     Rate and Rhythm: Regular rhythm. Bradycardia present.     Heart sounds: No murmur heard. Pulmonary:     Effort: Pulmonary effort is normal.     Breath sounds: No wheezing.  Abdominal:     General: There is distension.     Tenderness: There is no abdominal tenderness. There is no guarding.  Musculoskeletal:     Cervical back: Normal range of motion.  Skin:    General: Skin is warm and dry.     Coloration:  Skin is jaundiced.  Neurological:     General: No focal deficit present.     Mental Status: He is alert.    ED Results / Procedures / Treatments   Labs (all labs ordered are listed, but only abnormal results are displayed) Labs Reviewed  CBC WITH DIFFERENTIAL/PLATELET - Abnormal; Notable for the following components:      Result Value   RBC 3.89 (*)    HCT 37.7 (*)    MCH 34.7 (*)    RDW 18.5 (*)    Platelets 39 (*)    nRBC 0.6 (*)    Monocytes Absolute 1.3 (*)    Abs Immature Granulocytes 0.40 (*)    All other components within normal limits  PROTIME-INR - Abnormal; Notable for the following components:   Prothrombin Time 25.9 (*)    INR 2.4 (*)    All other components within normal limits  URINALYSIS, ROUTINE W REFLEX MICROSCOPIC - Abnormal; Notable for the following components:   Color, Urine AMBER (*)    APPearance CLOUDY (*)    Hgb urine dipstick SMALL (*)    Bilirubin Urine MODERATE (*)    Protein, ur 30 (*)    Bacteria, UA RARE (*)    All other components within normal limits  I-STAT CHEM 8, ED - Abnormal; Notable for the following components:   Sodium 124 (*)    Chloride 94  (*)    BUN 71 (*)    Creatinine, Ser 6.60 (*)    Calcium, Ion 1.02 (*)    TCO2 19 (*)    All other components within normal limits  RESP PANEL BY RT-PCR (FLU A&B, COVID) ARPGX2  COMPREHENSIVE METABOLIC PANEL  LIPASE, BLOOD  AMMONIA  APTT    EKG EKG Interpretation  Date/Time:  Saturday March 23 2021 13:57:17 EST Ventricular Rate:  45 PR Interval:  202 QRS Duration: 134 QT Interval:  586 QTC Calculation: 507 R Axis:   57 Text Interpretation: Sinus bradycardia Left bundle branch block bradycardia otherwise no sig change from previous Confirmed by Charlesetta Shanks [phone]) on 03/23/2021 3:02:37 PM  Radiology DG Chest 1 View  Result Date: 03/23/2021 CLINICAL DATA:  Shortness of breath on exertion. EXAM: CHEST  1 VIEW COMPARISON:  December 15, 2019 FINDINGS: The heart size and mediastinal contours are stable. Both lungs are clear. The visualized skeletal structures are stable. IMPRESSION: No active cardiopulmonary disease. Electronically Signed   By: Abelardo Diesel M.D.   On: 03/23/2021 13:58    Procedures Procedures   Medications Ordered in ED Medications - No data to display  ED Course  I have reviewed the triage vital signs and the nursing notes.  Pertinent labs & imaging results that were available during my care of the patient were reviewed by me and considered in my medical decision making (see chart for details).    MDM Rules/Calculators/A&P                          Patient arrives complaining of SOB and has not been making urine over the past two days. Patient endorses slightly worsened jaundice from baseline with chronic liver failure currently awaiting transplant through [facility].   On arrival patient is bradycardic with borderline hypotension. Patient is afebrile  High clinical suspicion for hepatorenal syndrome. Despite shortness of breath, patient lungs CTAB. Patient has distended nontender abdomen most likely in the setting of ascites.   iStat labs  significant for acute renal failure with  creatinine of 6.60. Increase from previous of 1.25. Patient has mild hyponatremia. The rest of patients lab work is still pending, will certainly require admission for acute renal failure.   Patient with 480cc of urine on bladder scan. Foley inserted to relieve any retention and monitor output.   Care signed out to [facility] who will follow up on pending labwork and disposition appropriately.    Final Clinical Impression(s) / ED Diagnoses Final diagnoses:  Acute renal failure, unspecified acute renal failure type [facility])    Rx / DC Orders ED Discharge Orders     None        Azucena Cecil, [place] 03/23/21 1533    Charlesetta Shanks, MD 03/28/21 [phone]

## 2021-03-23 NOTE — ED Triage Notes (Signed)
Arrived via EMS; c/o shortness of breathe on exertion. Reported known liver problem and is on the transplant list. Patient complained of no urine output x 2 days, and denies kidney problem. Patient stated he feels like he gained 10 lbs.

## 2021-03-23 NOTE — ED Provider Notes (Signed)
Patient did have episode of hematemesis.  BP remains stable.  I've ordered 500 cc IV fluid bolus given to assist renal perfusion.  Awaiting life flight transfer.   Wyvonnia Dusky, MD 03/24/21 [phone]

## 2021-03-23 NOTE — ED Notes (Signed)
Report  called to the nurse dave rn on icu

## 2021-03-23 NOTE — ED Notes (Signed)
Patient transported to US 

## 2021-03-23 NOTE — ED Provider Notes (Addendum)
Physical Exam  BP (!) 106/56   Pulse (!) 44   Temp (!) 97.4 F (36.3 C) (Oral)   Resp 15   Ht 6' (1.829 m)   Wt 127.5 kg   SpO2 93%   BMI 38.11 kg/m   Physical Exam Vitals and nursing note reviewed.  Constitutional:      General: He is sleeping.     Appearance: He is toxic-appearing.  HENT:     Head: Normocephalic and atraumatic.     Mouth/Throat:     Mouth: Mucous membranes are moist.     Pharynx: Oropharynx is clear. Uvula midline. No oropharyngeal exudate or posterior oropharyngeal erythema.  Eyes:     General: Scleral icterus present.        Right eye: No discharge.        Left eye: No discharge.     Extraocular Movements: Extraocular movements intact.     Conjunctiva/sclera: Conjunctivae normal.     Pupils: Pupils are equal, round, and reactive to light.  Neck:     Trachea: Trachea and phonation normal.  Cardiovascular:     Rate and Rhythm: Regular rhythm. Bradycardia present.     Pulses: Normal pulses.     Heart sounds: Normal heart sounds. No murmur heard. Pulmonary:     Effort: Pulmonary effort is normal. Tachypnea present. No respiratory distress.     Breath sounds: Normal breath sounds. No wheezing or rales.  Chest:     Chest wall: No mass, lacerations, deformity, swelling, tenderness, crepitus or edema.  Abdominal:     General: There is distension.     Palpations: Abdomen is soft. There is fluid wave.     Tenderness: There is no abdominal tenderness.  Musculoskeletal:        General: No deformity.     Cervical back: Normal range of motion and neck supple. No edema, rigidity or crepitus. No pain with movement, spinous process tenderness or muscular tenderness.     Right lower leg: 1+ Edema present.     Left lower leg: 1+ Edema present.  Lymphadenopathy:     Cervical: No cervical adenopathy.  Skin:    General: Skin is warm and dry.     Capillary Refill: Capillary refill takes less than 2 seconds.     Coloration: Skin is jaundiced.  Neurological:      Mental Status: He is oriented to person, place, and time and easily aroused. Mental status is at baseline.     GCS: GCS eye subscore is 4. GCS verbal subscore is 5. GCS motor subscore is 6.  Psychiatric:        Mood and Affect: Mood normal.    ED Course/Procedures   Clinical Course as of 03/24/21 0030  Sat Mar 23, 2021  1718 Consult to gastroenterology, Dr. Bryan Lemma, who recommends Abdomen US for emergent paracentesis, consult to nephro for management of hepatorenal syndrome, and attempted transfer to transplant center.  I appreciate his collaboration in the care of this patient.  [RS]  1726 Consult to nephrologist, Dr. Melvia Heaps, who is going to look in the patient's chart and communicate further management recommendations and appropriate dosing for octreotide and midodrine for this patient, if appropriate.  I appreciate his collaboration of care of this patient. [RS]  2725 Consult call again from Dr. Bryan Lemma, GI.  Per his chart review patient was initially declined from [facility] due to low meld score of 20, however may be candidate for their service now.  Appreciate his collaboration in the care  of this patient. 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  Exam is technically limited but findings are suspicious.  Pressure collaboration care of this patient. [RS]  8527 Repage to [facility] transplant team as it has been more than an hour. Page to [facility] transfer coordinator for discussion with transplant team.   [RS]  [phone] Dr. Jonnie Finner, nephrologist, recommends midodrine 10 mg p.o. 3 times daily and octreotide 200 mcg subcutaneous every 8 hours.  Albumin dosage ordered in the patient's chart by nephrologist.  I appreciate his collaboration of care of this patient. [RS]  1945 Consult cal received form Dr. Devin Going, [place] [facility] service.  She feels patient would be best served by transferring to their service.   Plan is for medical admission here tonight and then transfer inpatient to inpatient to the medicine service at [facility] transplant center tomorrow where he will be evaluated by the transplant team and the gastroenterology service there.  I appreciate their collaboration in the care of this patient. [RS]  2138 Call received from [facility] transplant surgeon, Dr. Angela Adam, states that she was able to contact the patient's transplant surgeon at [facility], Dr. Ellamae Sia.  She communicated the case with him and he prefers that the patient be transferred to [facility] as he is already listed for transplant they are and would require further evaluation prior to listing at [facility].  I spoke with [facility] transplant surgeon Dr. Mardene Celeste over the phone, and he has already begun to arrange [facility] for this patient to their facility this evening.  Do feel this would be best to disposition for the patient given he is already listed for transplant there and can move forward after arrival.  Family made aware.  I appreciate the collaboration of the surgeons in the care of this patient. [RS]  2350 I have received communication that [facility] is in route from [place] airport with the [facility] transplant transport team to pick up this patient and [facility] him to [facility]. [RS]  Sun Mar 24, 2021  0015 Patient has left the department in the care of [facility] medics, to be transported by fixed wing plane to [facility] for admission to the transport team. I appreciate their collaboration in the care of this patient.  [RS]    Clinical Course User Index [RS] Brittanyann Wittner R, PA-C    .Critical Care Performed by: Emeline Darling, PA-C Authorized by: Emeline Darling, PA-C   Critical care provider statement:    Critical care time (minutes):  120   Critical care was time spent personally by me on the following activities:  Development of treatment plan with patient or surrogate, discussions with consultants, evaluation of patient's  response to treatment, examination of patient, obtaining history from patient or surrogate, ordering and performing treatments and interventions, ordering and review of laboratory studies, ordering and review of radiographic studies, pulse oximetry and re-evaluation of patient's condition  MDM   Care of this patient was assumed from preceding ED provider Genevive Bi, PA-C at time of shift change.  Please see his associated note for further insight into the patient's ED course.  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  [facility]ed [facility] for liver transplant, however the liver was nonviable and he has been at home since that time.  The last few days he has become more short of breath with gradually  decreasing urine output with no urine output for 48 hours.  No history of CKD in the past.  At time of shift change patient is awaiting results of his laboratory studies.  CBC has resulted and is unremarkable.  I-STAT Chem-8 is concerning with creatinine of 6.6, BUN elevated to 71.  Baseline creatinine less than 1.  CMP with hyponatremia of 120, potassium is normal.  BUN/creatinine 72/5.76 on CMP.  AST/ALT 165/56, alk phos elevated to 172, and total bilirubin elevated to 33.1.    [facility] is patient's primary care.  He was able to show me these lab results on his phone from 03/14/21 :Bilirubin previously 18.3, AST 137, ALT 42.  Alk phos 168.  INR 1.9.  Lipase elevated to 56 today.  Foley catheter was placed by preceding ED team for closer management of his I's and O's.  Scant amount of urine in the Foley bag at time of my evaluation of the patient.  Urine is cloudy with small amount of hemoglobin and moderate bilirubin.  Patient is requiring 2 L supplemental oxygen by nasal cannula to maintain saturations over 92%.  MELD score of 40.  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  Pending consult calls back from [facility] and [facility] transplant teams.  Midodrine, octreotide, albumin ordered.  Paracentesis ordered but will not be performed until tomorrow morning as IR team has left the building, per nephro max amount of fluid to be removed in 10 paracentesis is 3 L.  Patient's ED course as per above.  Patient maintained hemodynamic stability throughout his stay in the ED.  Transiently had a hypotensive episode with systolic BP in the 26E but this improved after beginning fluid bolus.  Plan for Levophed if necessary.  Episode of hematemesis at 11:30 - treated with zofran and protonix. No further emesis.  [facility] transplant surgeon was able to coordinate [facility] for this patient.  Ultimately he was transported by EMS to [place] airport where he was to be transported by [employer] to [facility] for further management.  He is stable for transport at this time, though in critical condition.  Mikki Santee and his wife Arbie Cookey voiced understanding of his medical evaluation and treatment plan.  Each of their questions was answered to their expressed satisfaction.  They are amenable to plan for transfer to [facility] at this time.  This chart was dictated using voice recognition software, Dragon. Despite the best efforts of this provider to proofread and correct errors, errors may still occur which can change documentation meaning.        Emeline Darling, PA-C 03/24/21 0030    Lacee Grey, Gypsy Balsam, PA-C 03/24/21 1349    Wyvonnia Dusky, MD 03/24/21 2200

## 2021-03-23 NOTE — ED Notes (Signed)
Pt vomiting just after the octrectide was  given sq

## 2021-03-23 NOTE — Consult Note (Signed)
Renal Service Consult Note [facility]  PIERRE DELLAROCCO 03/23/2021 Sol Blazing, MD Requesting Physician: Dr. Langston Masker, Jerilynn Mages.   Reason for Consult: Renal failure  HPI: The patient is a 65 y.o. year-old w/ hx of DJD, DM2, gout, HTN, pna and hx of NASH cirrhosis presented to ED via EMS w/ DOE, hx liver problems on transplant list. No UOP for 2 days, no hx kidney disease. Pt feels like he gained 10 lbs. In ED pt was extremely jaundiced, abdomen was distended and tense. CXR clear. Creat 5.6, plts 36K (last plt ct 40-50 range).  WBC 8k.  Creat in Aug and October was around 0.96- 1.26.  We are asked to see for renal failure.   Pt seen in ED.  A bit lethargic but responds appropriately and is fully oriented.  Denies any hx of kidney problems, no nsaids. No acei/ ARB.  Decreased UOP the last 2 days, started about the time his abdomen really swelled up. No SOB, cough or CP.  No abd pain.   ROS - denies CP, no joint pain, no HA, no blurry vision, no rash, no diarrhea, no nausea/ vomiting, no dysuria, no difficulty voiding   Past Medical History  Past Medical History:  Diagnosis Date   Arthritis    shoulder- both, neck, spine, GOUT   Diabetes mellitus    Gout    H/O exercise stress test    many yrs. 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 weight loss -over 18 mon. period    Past Surgical History  Past Surgical History:  Procedure Laterality Date   ANTERIOR CERVICAL DECOMP/DISCECTOMY FUSION N/A 12/20/2012   Procedure: C5-6, C6-7 ANTERIOR CERVICAL DISCECTOMY AND FUSION, ALLOGRAFT, PLATE;  Surgeon: Marybelle Killings, MD;  Location: [place];  Service: Orthopedics;  Laterality: N/A;   ESOPHAGOGASTRODUODENOSCOPY (EGD) WITH PROPOFOL Left 12/16/2019   Procedure: ESOPHAGOGASTRODUODENOSCOPY (EGD) WITH PROPOFOL;  Surgeon: Wilford Corner, MD;  Location: [place];  Service: Endoscopy;  Laterality: Left;    ESOPHAGOGASTRODUODENOSCOPY (EGD) WITH PROPOFOL N/A 02/16/2021   Procedure: ESOPHAGOGASTRODUODENOSCOPY (EGD) WITH PROPOFOL;  Surgeon: Carol Ada, MD;  [facility];  Service: Endoscopy;  Laterality: N/A;   HOT HEMOSTASIS N/A 02/16/2021   Procedure: HOT HEMOSTASIS (ARGON PLASMA COAGULATION/BICAP);  Surgeon: Carol Ada, MD;  Location: [place];  Service: Endoscopy;  Laterality: N/A;   KNEE SURGERY     osgood slaughter syndrome , 18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 He has never used smokeless tobacco. He reports current alcohol use. He reports that he does not use drugs. Allergies  Allergies  Allergen Reactions   Gabapentin Swelling   Meloxicam Other (See Comments)    Cannot take per MD ( awaiting on liver transplant)   Pork-Derived Products Other (See Comments)    Cannot eat- gout   Statins Other (See Comments)    Cramps    Trazodone And Nefazodone Nausea Only and Other (See Comments)    Caused patient to have a lowered level of tolerance, too   Lisinopril Rash   Niacin Rash   Niacin And Related Rash   Home medications Prior to Admission medications   Medication Sig Start Date End  Date Taking? Authorizing Provider  aspirin EC 81 MG tablet Take 81 mg by mouth daily. Swallow whole.   Yes [provider]  augmented betamethasone dipropionate (DIPROLENE-AF) 0.05 % cream Apply 1 application topically 2 (two) times daily. 02/01/20  Yes [provider]  Cholecalciferol (VITAMIN D3) 25 MCG (1000 UT) CAPS Take 1,000 Units by mouth daily.   Yes [provider]  diphenhydrAMINE (BENADRYL) 25 mg capsule Take 25 mg by mouth every 6 (six) hours as needed for itching.    Yes [provider]  furosemide (LASIX) 20 MG tablet Take 20 mg by mouth daily. 12/12/20  Yes [provider]  lactulose (CHRONULAC)  10 GM/15ML solution Take 45 mLs (30 g total) by mouth 3 (three) times daily. Hold for more than 3 large BM's a day. 02/22/20  Yes Swayze, Ava, DO  LUBRICATING PLUS EYE DROPS 0.5 % SOLN Place 1 drop into both eyes 3 (three) times daily as needed (for irritation).  10/05/19  Yes [provider]  metFORMIN (GLUCOPHAGE) 500 MG tablet Take 500 mg by mouth 2 (two) times daily with a meal.   Yes [provider]  metoprolol tartrate (LOPRESSOR) 50 MG tablet Take 50 mg by mouth 2 (two) times daily. 09/04/20  Yes [provider]  montelukast (SINGULAIR) 10 MG tablet Take 10 mg by mouth at bedtime.   Yes [provider]  pantoprazole (PROTONIX) 40 MG tablet Take 40 mg by mouth daily as needed (heartburn, indigestion). 12/19/19  Yes [provider]  spironolactone (ALDACTONE) 25 MG tablet Take 25 mg by mouth daily.    Yes [provider]  THERAPEUTIC SHAMPOO 0.5 % shampoo Apply 1 application topically See admin instructions. Shampoo at least 4 times a week 10/05/19  Yes [provider]  XIFAXAN 550 MG TABS tablet Take 550 mg by mouth 2 (two) times daily. 12/12/20  Yes [provider]     Vitals:   03/23/21 1515 03/23/21 1530 03/23/21 1545 03/23/21 1600  BP: (!) 102/55 (!) 97/48 (!) 83/66 (!) 102/54  Pulse: (!) 45 (!) 44 (!) 47 (!) 45  Resp: (!) 21 (!) 23 (!) 23 (!) 21  Temp:      TempSrc:      SpO2: 99% 98% 98% 97%  Weight:      Height:       Exam Gen alert, no distress No rash, cyanosis or gangrene Sclera anicteric, throat clear  No jvd or bruits Chest clear bilat to bases, no rales/ wheezing RRR no MRG Abd soft ntnd diffuse 3+ ascites w/ +hsm GU foley w/ small amt brownish urine in tube MS no joint effusions or deformity Ext 1+ pitting pretib edema bilat LE's, no hip or UE edema, no wounds or ulcers Neuro is lethargic, Ox 3, no asterixis , no myoclonic jerking     Home meds include - asa, lasix 20 qd, lactulose 30 gm tid,  metformin, metoprolol tartrate 50 bid, singulair, protonix, aldactone 25, xifaxan 500 bid, prns/ vits / supps    Na 120  K 4.1  CO2 17  BUN 72  Cr 5.76  CA 8.7  Alb 1.9  lipase 56^  AST 165/ ALT 56, Tbili 33  INR 2.4  eGFR 34m/min      WBC 8K  Hb 13  plt 39k   BP's in ED  83/66- 113/54,  HR 45- 48, RR 16- 22, afeb    Creat in Aug and October 2022 was around 0.96- 1.26, eGFR >  60 ml/min    UA 11/12 - cloudy, amber, 30 protein, 0-5 rbc, 6-10 wbc, epi 0.5, rare bact     Renal US > 11-12 cm kidneys, no hydro, slight ^ echo bilat   Assessment/ Plan: AKI - in pt w/ severe liver disease/ cirrhosis, decompensated. Baseline creat is 0.96- 1.26, eGFR > 60 ml/min for both. UA unremarkable, no obstruction on [place].  Made 400cc urine w/ foley placement here but minimal UOP since then. Urine studies are pending. Hepatorenal syndrome is most likely given his clinical history, borderline low BP's, severe ascites and ^^tbili. Also consider bile cast nephropathy. Recommend start HRS protocol w/ midodrine 10 tid, octreotide SQ 261mg tid and IV albumin 25 gm tid. [facility]. If pt goes to ICU, recommend instead levophed gtt and IV albumin, w/ goal again is to raise the MAP by 15 pts. If pt requires a large vol paracentesis, would try to limit the UF to 3-5 L given his AKI.  Pt does not have strong indication for RRT at this time. Will follow.  Cirrhosis - due to NASH, decompensated Hyponatremia - part of decomp cirrhosis, rx same as for HRS w/ goal of raising MAP which will improve renal perfusion and cause excretion of more H2O via the kidneys.  Hypotension - typical of cirrhosis physiology DM2 -- per pmd       RKelly Splinter MD 03/23/2021, 5:44 PM  Recent Labs  Lab 03/23/21 1407 03/23/21 1417  WBC 8.0  --   HGB 13.5 13.6   Recent Labs  Lab 03/23/21 1407 03/23/21 1417  K 4.1 4.3  BUN 72* 71*  CREATININE 5.76* 6.60*  CALCIUM 8.7*  --

## 2021-03-23 NOTE — ED Provider Notes (Signed)
Patient here with hepatorenal syndrome, liver failure, jaundice, elevated Tbili, acute on chronic kidney injury.    Pending life-flight admission to [facility], accepted by Dr Tommie Raymond transplant surgeon at that facility.    At 11:15 PM, life-flight being arranged, patient stable with BP 120/70, HR 50, RR 20, 95% on room air, afebrile.  He is jaundiced.  Receiving midodrine, octreotide, albumin.  Will give 500 cc small fluid bolus.   Wyvonnia Dusky, MD 03/23/21 [phone]

## 2021-03-24 NOTE — ED Notes (Signed)
Pt left with [facility] with [facility] crew back to the air port

## 2021-03-24 NOTE — ED Provider Notes (Signed)
Patient primarily care for by Dr. Langston Masker and Cheron Every, PA.    12:17 AM Air care is at bedside for transfer of the patient to [place].  He was reevaluated.  Airway is intact.  Blood pressure improved to the low 100s.  He is critically ill appearing and jaundiced but is hemodynamically stable at this time.  Feel he is appropriate for transport and has been reasonably stabilized given all available resources.   Merryl Hacker, MD 03/24/21 [phone]

## 2021-03-26 DIAGNOSIS — Z944 Liver transplant status: Secondary | ICD-10-CM | POA: Insufficient documentation

## 2021-03-27 DIAGNOSIS — Z944 Liver transplant status: Secondary | ICD-10-CM | POA: Insufficient documentation

## 2021-03-28 LAB — CULTURE, BLOOD (ROUTINE X 2)
Culture: NO GROWTH
Culture: NO GROWTH
Special Requests: ADEQUATE

## 2021-03-30 DIAGNOSIS — D649 Anemia, unspecified: Secondary | ICD-10-CM | POA: Insufficient documentation

## 2021-04-03 DIAGNOSIS — E119 Type 2 diabetes mellitus without complications: Secondary | ICD-10-CM | POA: Insufficient documentation

## 2021-04-16 DIAGNOSIS — R410 Disorientation, unspecified: Secondary | ICD-10-CM | POA: Insufficient documentation

## 2021-05-02 DIAGNOSIS — R0902 Hypoxemia: Secondary | ICD-10-CM | POA: Insufficient documentation

## 2021-05-20 DIAGNOSIS — K1379 Other lesions of oral mucosa: Secondary | ICD-10-CM | POA: Insufficient documentation

## 2021-05-20 DIAGNOSIS — K089 Disorder of teeth and supporting structures, unspecified: Secondary | ICD-10-CM | POA: Insufficient documentation

## 2021-05-24 DIAGNOSIS — Z9189 Other specified personal risk factors, not elsewhere classified: Secondary | ICD-10-CM | POA: Insufficient documentation

## 2021-05-24 DIAGNOSIS — D849 Immunodeficiency, unspecified: Secondary | ICD-10-CM | POA: Insufficient documentation

## 2021-06-25 ENCOUNTER — Telehealth: Payer: Self-pay | Admitting: Oncology

## 2021-06-25 NOTE — Telephone Encounter (Signed)
Scheduled appt per 2/13 referral. Spoke to pt who is aware of appt date and time. Pt is aware to arrive 15 mins prior to appt time.

## 2021-07-12 ENCOUNTER — Other Ambulatory Visit: Payer: Self-pay

## 2021-07-12 ENCOUNTER — Inpatient Hospital Stay: Payer: Medicare Other

## 2021-07-12 ENCOUNTER — Inpatient Hospital Stay: Payer: Medicare Other | Attending: Oncology | Admitting: Oncology

## 2021-07-12 DIAGNOSIS — Z944 Liver transplant status: Secondary | ICD-10-CM | POA: Insufficient documentation

## 2021-07-12 DIAGNOSIS — D84821 Immunodeficiency due to drugs: Secondary | ICD-10-CM | POA: Diagnosis not present

## 2021-07-12 DIAGNOSIS — I129 Hypertensive chronic kidney disease with stage 1 through stage 4 chronic kidney disease, or unspecified chronic kidney disease: Secondary | ICD-10-CM | POA: Diagnosis not present

## 2021-07-12 DIAGNOSIS — Z79624 Long term (current) use of inhibitors of nucleotide synthesis: Secondary | ICD-10-CM | POA: Diagnosis not present

## 2021-07-12 DIAGNOSIS — N189 Chronic kidney disease, unspecified: Secondary | ICD-10-CM

## 2021-07-12 DIAGNOSIS — K7460 Unspecified cirrhosis of liver: Secondary | ICD-10-CM | POA: Diagnosis not present

## 2021-07-12 DIAGNOSIS — D508 Other iron deficiency anemias: Secondary | ICD-10-CM

## 2021-07-12 DIAGNOSIS — D631 Anemia in chronic kidney disease: Secondary | ICD-10-CM | POA: Diagnosis not present

## 2021-07-12 DIAGNOSIS — E1122 Type 2 diabetes mellitus with diabetic chronic kidney disease: Secondary | ICD-10-CM | POA: Insufficient documentation

## 2021-07-12 DIAGNOSIS — D509 Iron deficiency anemia, unspecified: Secondary | ICD-10-CM

## 2021-07-12 LAB — CBC WITH DIFFERENTIAL (CANCER CENTER ONLY)
Abs Immature Granulocytes: 0.07 10*3/uL (ref 0.00–0.07)
Basophils Absolute: 0.1 10*3/uL (ref 0.0–0.1)
Basophils Relative: 2 %
Eosinophils Absolute: 0.2 10*3/uL (ref 0.0–0.5)
Eosinophils Relative: 3 %
HCT: 28.6 % — ABNORMAL LOW (ref 39.0–52.0)
Hemoglobin: 9.4 g/dL — ABNORMAL LOW (ref 13.0–17.0)
Immature Granulocytes: 1 %
Lymphocytes Relative: 9 %
Lymphs Abs: 0.7 10*3/uL (ref 0.7–4.0)
MCH: 30.2 pg (ref 26.0–34.0)
MCHC: 32.9 g/dL (ref 30.0–36.0)
MCV: 92 fL (ref 80.0–100.0)
Monocytes Absolute: 0.8 10*3/uL (ref 0.1–1.0)
Monocytes Relative: 11 %
Neutro Abs: 5.4 10*3/uL (ref 1.7–7.7)
Neutrophils Relative %: 74 %
Platelet Count: 164 10*3/uL (ref 150–400)
RBC: 3.11 MIL/uL — ABNORMAL LOW (ref 4.22–5.81)
RDW: 14 % (ref 11.5–15.5)
WBC Count: 7.3 10*3/uL (ref 4.0–10.5)
nRBC: 0 % (ref 0.0–0.2)

## 2021-07-12 LAB — IRON AND IRON BINDING CAPACITY (CC-WL,HP ONLY)
Iron: 66 ug/dL (ref 45–182)
Saturation Ratios: 32 % (ref 17.9–39.5)
TIBC: 204 ug/dL — ABNORMAL LOW (ref 250–450)
UIBC: 138 ug/dL (ref 117–376)

## 2021-07-12 LAB — FERRITIN: Ferritin: 922 ng/mL — ABNORMAL HIGH (ref 24–336)

## 2021-07-12 NOTE — Progress Notes (Signed)
Reason for the request:    Iron deficiency anemia  HPI: I was asked by Dr. Sela Hilding to evaluate Randall Frazier for the evaluation of iron deficiency anemia.  He is a 66 year old man with history of diabetes, hypertension and developed cirrhosis of the liver acutely in the last 2 years.  He required urgent liver transplant on [facility] in 2022.  He remains on immunosuppression with mycophenolate and tacrolimus.  He also developed renal failure although did not require dialysis.  His most recent labs obtained in February 2023 showed hematocrit of 30, platelet count of 190 with white cell count of 2.1.  His creatinine was 3.5 with bilirubin of 0.4.  Clinically, he reports feeling better since discharge liver transplant but still rather weak and fatigued.  He denies any hematochezia, melena or hemoptysis.  He denies any recent falls or syncope.  He denies any jaundice.  He does not report any headaches, blurry vision, syncope or seizures. Does not report any fevers, chills or sweats.  Does not report any cough, wheezing or hemoptysis.  Does not report any chest pain, palpitation, orthopnea or leg edema.  Does not report any nausea, vomiting or abdominal pain.  Does not report any constipation or diarrhea.  Does not report any skeletal complaints.    Does not report frequency, urgency or hematuria.  Does not report any skin rashes or lesions. Does not report any heat or cold intolerance.  Does not report any lymphadenopathy or petechiae.  Does not report any anxiety or depression.  Remaining review of systems is negative.     Past Medical History:  Diagnosis Date   Arthritis    shoulder- both, neck, spine, GOUT   Diabetes mellitus    Gout    H/O exercise stress test    many yrs. 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 weight loss -over 18 mon. period   :   Past Surgical History:  Procedure Laterality Date   ANTERIOR  CERVICAL DECOMP/DISCECTOMY FUSION N/A 12/20/2012   Procedure: C5-6, C6-7 ANTERIOR CERVICAL DISCECTOMY AND FUSION, ALLOGRAFT, PLATE;  Surgeon: Marybelle Killings, MD;  Location: [place];  Service: Orthopedics;  Laterality: N/A;   ESOPHAGOGASTRODUODENOSCOPY (EGD) WITH PROPOFOL Left 12/16/2019   Procedure: ESOPHAGOGASTRODUODENOSCOPY (EGD) WITH PROPOFOL;  Surgeon: Wilford Corner, MD;  Location: [place];  Service: Endoscopy;  Laterality: Left;   ESOPHAGOGASTRODUODENOSCOPY (EGD) WITH PROPOFOL N/A 02/16/2021   Procedure: ESOPHAGOGASTRODUODENOSCOPY (EGD) WITH PROPOFOL;  Surgeon: Carol Ada, MD;  Location: [place];  Service: Endoscopy;  Laterality: N/A;   HOT HEMOSTASIS N/A 02/16/2021   Procedure: HOT HEMOSTASIS (ARGON PLASMA COAGULATION/BICAP);  Surgeon: Carol Ada, MD;  Location: [place];  Service: Endoscopy;  Laterality: N/A;   KNEE SURGERY     osgood slaughter syndrome , 18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 Swallow whole., Disp: , Rfl:    augmented betamethasone dipropionate (DIPROLENE-AF) 0.05 % cream, Apply 1 application topically 2 (two) times daily., Disp: , Rfl:    Cholecalciferol (VITAMIN D3) 25 MCG (1000 UT) CAPS, Take 1,000 Units by mouth daily., Disp: , Rfl:    diphenhydrAMINE (BENADRYL) 25 mg capsule, Take 25 mg by mouth every 6 (six) hours as needed for itching. , Disp: , Rfl:    furosemide (LASIX) 20 MG tablet,  Take 20 mg by mouth daily., Disp: , Rfl:    lactulose (CHRONULAC) 10 GM/15ML solution, Take 45 mLs (30 g total) by mouth 3 (three) times daily. Hold for more than 3 large BM's a day., Disp: 236 mL, Rfl: 0   LUBRICATING PLUS EYE DROPS 0.5 % SOLN, Place 1 drop into both eyes 3 (three) times daily as needed (for irritation). , Disp: , Rfl:    metFORMIN (GLUCOPHAGE) 500 MG tablet, Take 500 mg by mouth 2 (two) times daily with a meal., Disp: , Rfl:    metoprolol tartrate (LOPRESSOR) 50 MG tablet, Take 50 mg  by mouth 2 (two) times daily., Disp: , Rfl:    montelukast (SINGULAIR) 10 MG tablet, Take 10 mg by mouth at bedtime., Disp: , Rfl:    pantoprazole (PROTONIX) 40 MG tablet, Take 40 mg by mouth daily as needed (heartburn, indigestion)., Disp: , Rfl:    spironolactone (ALDACTONE) 25 MG tablet, Take 25 mg by mouth daily. , Disp: , Rfl:    THERAPEUTIC SHAMPOO 0.5 % shampoo, Apply 1 application topically See admin instructions. Shampoo at least 4 times a week, Disp: , Rfl:    XIFAXAN 550 MG TABS tablet, Take 550 mg by mouth 2 (two) times daily., Disp: , Rfl: :   Allergies  Allergen Reactions   Gabapentin Swelling   Meloxicam Other (See Comments)    Cannot take per MD ( awaiting on liver transplant)   Pork-Derived Products Other (See Comments)    Cannot eat- gout   Statins Other (See Comments)    Cramps    Trazodone And Nefazodone Nausea Only and Other (See Comments)    Caused patient to have a lowered level of tolerance, too   Lisinopril Rash   Niacin Rash   Niacin And Related Rash  :   Family History  Problem Relation Age of Onset   Liver disease Sister    Diabetes Neg Hx   :   Social History   Socioeconomic History   Marital status: Married    Spouse name: Not on file   Number of children: Not on file   Years of education: Not on file   Highest education level: Not on file  Occupational History   Not on file  Tobacco Use   Smoking status: Never   Smokeless tobacco: Never  Substance and Sexual Activity   Alcohol use: Yes    Comment: 2 beers / year- very rare   Drug use: No   Sexual activity: Not on file  Other Topics Concern   Not on file  Social History Narrative   Not on file   Social Determinants of Health   Financial Resource Strain: Not on file  Food Insecurity: Not on file  Transportation Needs: Not on file  Physical Activity: Not on file  Stress: Not on file  Social Connections: Not on file  Intimate Partner Violence: Not on file  :  Pertinent  items are noted in HPI.  Exam: Blood pressure (!) 149/72, pulse 97, temperature (!) 97.2 F (36.2 C), temperature source Tympanic, resp. rate 17, weight 215 lb 7 oz (97.7 kg), SpO2 97 %. ECOG 1 General appearance: alert and cooperative appeared without distress. Head: atraumatic without any abnormalities. Eyes: conjunctivae/corneas clear. PERRL.  Sclera anicteric. Throat: lips, mucosa, and tongue normal; without oral thrush or ulcers. Resp: clear to auscultation bilaterally without rhonchi, wheezes or dullness to percussion. Cardio: regular rate and rhythm, S1, S2 normal, no murmur, click, rub or gallop GI:  soft, non-tender; bowel sounds normal; no masses,  no organomegaly Skin: Skin color, texture, turgor normal. No rashes or lesions Lymph nodes: Cervical, supraclavicular, and axillary nodes normal. Neurologic: Grossly normal without any motor, sensory or deep tendon reflexes. Musculoskeletal: No joint deformity or effusion.   Assessment and Plan:    66 year old with:  1.  Multifactorial anemia with element of iron deficiency as well as anemia related to chronic kidney disease.  His hematocrit of most recently was 30 without any recent updated iron studies.  Treatment options moving forward were discussed and the differential diagnosis and etiology were reviewed.  Given his renal failure and most recent hospitalization for liver transplant and possible GI bleeding it is reasonable that he has accumulated iron deficiency in addition to anemia of renal disease.  Further management options oral iron (intravenous iron were discussed at this time.  Given his multiple comorbidities and the need to improve his hemoglobin and recent transplant I recommended intravenous iron which would be reasonable.  Prior to proceeding with that we will update his iron studies including iron level and ferritin.  Complication associated with iron sucrose infusion were discussed.  These include infusion  related complications, arthralgias, myalgias and rarely anaphylaxis.  He is agreeable to proceed after we update his laboratory testing.  2.  Follow-up: will be in 4 months to repeat iron studies to make sure adequate replacement of his iron levels.   45  minutes were dedicated to this visit. The time was spent on reviewing laboratory data, discussing treatment options, discussing differential diagnosis and answering questions regarding future plan.     A copy of this consult has been forwarded to the requesting physician.

## 2021-07-18 ENCOUNTER — Telehealth: Payer: Self-pay | Admitting: Oncology

## 2021-07-18 NOTE — Telephone Encounter (Signed)
Scheduled per 03/01 los, patient has been called and voicemail was left. ?

## 2021-07-19 ENCOUNTER — Inpatient Hospital Stay: Payer: Medicare Other

## 2021-07-19 ENCOUNTER — Other Ambulatory Visit: Payer: Self-pay

## 2021-07-19 VITALS — BP 142/70 | HR 80 | Temp 98.0°F | Resp 18

## 2021-07-19 DIAGNOSIS — E1122 Type 2 diabetes mellitus with diabetic chronic kidney disease: Secondary | ICD-10-CM | POA: Diagnosis not present

## 2021-07-19 DIAGNOSIS — D508 Other iron deficiency anemias: Secondary | ICD-10-CM

## 2021-07-19 MED ORDER — SODIUM CHLORIDE 0.9 % IV SOLN
200.0000 mg | Freq: Once | INTRAVENOUS | Status: AC
  Administered 2021-07-19: 200 mg via INTRAVENOUS
  Filled 2021-07-19: qty 200

## 2021-07-19 MED ORDER — SODIUM CHLORIDE 0.9 % IV SOLN: Freq: Once | INTRAVENOUS | Status: DC

## 2021-07-19 NOTE — Patient Instructions (Signed)

## 2021-07-19 NOTE — Progress Notes (Signed)
Pt monitored for 30 minutes post iron infusion. VSS. No complaints at time of discharge.  ?

## 2021-07-26 ENCOUNTER — Inpatient Hospital Stay: Payer: Medicare Other

## 2021-08-02 ENCOUNTER — Encounter: Payer: Self-pay | Admitting: Oncology

## 2021-08-02 ENCOUNTER — Inpatient Hospital Stay: Payer: Medicare Other

## 2021-08-02 ENCOUNTER — Other Ambulatory Visit: Payer: Self-pay

## 2021-08-02 VITALS — BP 137/66 | HR 70 | Temp 97.9°F | Resp 17

## 2021-08-02 DIAGNOSIS — E1122 Type 2 diabetes mellitus with diabetic chronic kidney disease: Secondary | ICD-10-CM | POA: Diagnosis not present

## 2021-08-02 DIAGNOSIS — D508 Other iron deficiency anemias: Secondary | ICD-10-CM

## 2021-08-02 MED ORDER — SODIUM CHLORIDE 0.9 % IV SOLN
200.0000 mg | Freq: Once | INTRAVENOUS | Status: AC
  Administered 2021-08-02: 200 mg via INTRAVENOUS
  Filled 2021-08-02: qty 200

## 2021-08-02 MED ORDER — SODIUM CHLORIDE 0.9 % IV SOLN: Freq: Once | INTRAVENOUS | Status: AC

## 2021-08-02 NOTE — Patient Instructions (Signed)

## 2021-08-09 ENCOUNTER — Inpatient Hospital Stay: Payer: Medicare Other

## 2021-08-16 ENCOUNTER — Other Ambulatory Visit: Payer: Self-pay

## 2021-08-16 ENCOUNTER — Inpatient Hospital Stay: Payer: Medicare Other | Attending: Oncology

## 2021-08-16 VITALS — BP 140/76 | HR 78 | Temp 98.2°F | Resp 18

## 2021-08-16 DIAGNOSIS — N189 Chronic kidney disease, unspecified: Secondary | ICD-10-CM | POA: Insufficient documentation

## 2021-08-16 DIAGNOSIS — D631 Anemia in chronic kidney disease: Secondary | ICD-10-CM | POA: Diagnosis present

## 2021-08-16 DIAGNOSIS — D508 Other iron deficiency anemias: Secondary | ICD-10-CM

## 2021-08-16 MED ORDER — SODIUM CHLORIDE 0.9 % IV SOLN
200.0000 mg | Freq: Once | INTRAVENOUS | Status: AC
  Administered 2021-08-16: 200 mg via INTRAVENOUS
  Filled 2021-08-16: qty 200

## 2021-08-16 MED ORDER — SODIUM CHLORIDE 0.9 % IV SOLN: Freq: Once | INTRAVENOUS | Status: AC

## 2021-08-16 NOTE — Patient Instructions (Signed)

## 2021-08-22 ENCOUNTER — Encounter: Payer: Self-pay | Admitting: Oncology

## 2021-08-23 ENCOUNTER — Inpatient Hospital Stay: Payer: Medicare Other

## 2021-08-30 ENCOUNTER — Inpatient Hospital Stay: Payer: Medicare Other

## 2021-09-19 DIAGNOSIS — N184 Chronic kidney disease, stage 4 (severe): Secondary | ICD-10-CM | POA: Insufficient documentation

## 2021-09-19 DIAGNOSIS — B49 Unspecified mycosis: Secondary | ICD-10-CM | POA: Insufficient documentation

## 2021-11-15 ENCOUNTER — Inpatient Hospital Stay: Payer: Medicare Other

## 2021-11-15 ENCOUNTER — Inpatient Hospital Stay: Payer: Medicare Other | Admitting: Oncology

## 2021-11-20 ENCOUNTER — Telehealth: Payer: Self-pay | Admitting: Oncology

## 2021-11-20 NOTE — Telephone Encounter (Signed)
.  Called patient to schedule appointment per 7/12 inbasket, patient is aware of date and time.

## 2021-12-18 ENCOUNTER — Other Ambulatory Visit: Payer: TRICARE For Life (TFL)

## 2021-12-18 ENCOUNTER — Ambulatory Visit: Payer: TRICARE For Life (TFL) | Admitting: Oncology

## 2021-12-24 ENCOUNTER — Inpatient Hospital Stay: Payer: TRICARE For Life (TFL)

## 2021-12-24 ENCOUNTER — Inpatient Hospital Stay: Payer: TRICARE For Life (TFL) | Admitting: Oncology

## 2022-03-31 DIAGNOSIS — I38 Endocarditis, valve unspecified: Secondary | ICD-10-CM | POA: Insufficient documentation

## 2022-03-31 DIAGNOSIS — E2740 Unspecified adrenocortical insufficiency: Secondary | ICD-10-CM | POA: Insufficient documentation

## 2022-11-07 ENCOUNTER — Ambulatory Visit: Payer: No Typology Code available for payment source | Attending: Internal Medicine | Admitting: Speech Pathology

## 2022-11-18 ENCOUNTER — Encounter: Payer: Self-pay | Admitting: Oncology

## 2022-11-18 ENCOUNTER — Other Ambulatory Visit: Payer: Self-pay

## 2022-11-18 ENCOUNTER — Ambulatory Visit: Payer: No Typology Code available for payment source | Attending: Internal Medicine

## 2022-11-18 DIAGNOSIS — R471 Dysarthria and anarthria: Secondary | ICD-10-CM | POA: Insufficient documentation

## 2022-11-18 DIAGNOSIS — R1311 Dysphagia, oral phase: Secondary | ICD-10-CM | POA: Insufficient documentation

## 2022-11-18 NOTE — Therapy (Signed)
OUTPATIENT SPEECH LANGUAGE PATHOLOGY EVALUATION   Patient Name: Randall Frazier MRN: [id number] DOB:December 18, 1955, 67 y.o., male Today's Date: 11/18/2022  PCP: Sondra Come, MD REFERRING PROVIDER: Sondra Come, MD  END OF SESSION:  End of Session - 11/18/22 1745     Visit Number 1    Number of Visits 17    Date for SLP Re-Evaluation 01/27/23    SLP Start Time 0804    SLP Stop Time  0845    SLP Time Calculation (min) 41 min    Activity Tolerance Patient tolerated treatment well             Past Medical History:  Diagnosis Date   Arthritis    shoulder- both, neck, spine, GOUT   Diabetes mellitus    Gout    H/O exercise stress test    many yrs. 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 weight loss -over 18 mon. period    Past Surgical History:  Procedure Laterality Date   ANTERIOR CERVICAL DECOMP/DISCECTOMY FUSION N/A 12/20/2012   Procedure: C5-6, C6-7 ANTERIOR CERVICAL DISCECTOMY AND FUSION, ALLOGRAFT, PLATE;  Surgeon: Eldred Manges, MD;  Location: [facility];  Service: Orthopedics;  Laterality: N/A;   ESOPHAGOGASTRODUODENOSCOPY (EGD) WITH PROPOFOL Left 12/16/2019   Procedure: ESOPHAGOGASTRODUODENOSCOPY (EGD) WITH PROPOFOL;  Surgeon: Charlott Rakes, MD;  Location: [facility] ENDOSCOPY;  Service: Endoscopy;  Laterality: Left;   ESOPHAGOGASTRODUODENOSCOPY (EGD) WITH PROPOFOL N/A 02/16/2021   Procedure: ESOPHAGOGASTRODUODENOSCOPY (EGD) WITH PROPOFOL;  Surgeon: Jeani Hawking, MD;  Location: [facility] ENDOSCOPY;  Service: Endoscopy;  Laterality: N/A;   HOT HEMOSTASIS N/A 02/16/2021   Procedure: HOT HEMOSTASIS (ARGON PLASMA COAGULATION/BICAP);  Surgeon: Jeani Hawking, MD;  Location: [facility] ENDOSCOPY;  Service: Endoscopy;  Laterality: N/A;   KNEE SURGERY     osgood slaughter syndrome , 18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    Patient Active Problem List   Diagnosis Date Noted   Iron deficiency anemia 07/12/2021   Hematemesis with nausea    GIB  (gastrointestinal bleeding) 02/16/2021   Other cirrhosis of liver (HCC)    GI bleed 02/15/2021   Hepatic encephalopathy (HCC) 02/19/2020   Portal hypertension with esophageal varices (HCC) 12/16/2019   Duodenal ulcer 12/16/2019   Gastric ulcer 12/16/2019   Gastritis 12/16/2019   DM2 (diabetes mellitus, type 2) (HCC) 12/15/2019   Decompensated hepatic cirrhosis (HCC) 12/15/2019   CAD (coronary artery disease) 12/15/2019   Hyperlipemia 12/15/2019   Essential hypertension 12/15/2019   Dyspnea 12/15/2019   Thrombocytopenia (HCC) 12/15/2019   AKI (acute kidney injury) (HCC) 12/15/2019   Hypoalbuminemia 12/15/2019   Acute hepatic encephalopathy (HCC) 12/15/2019   Hyponatremia 12/15/2019   Cellulitis of right leg 12/15/2019   Melena 12/14/2019   Cervical spondylosis without myelopathy 12/20/2012    Class: Diagnosis of   Biliary dyskinesia 10/05/2012    ONSET DATE: January 2024  REFERRING DIAG:  R47.1 (ICD-10-CM) - Dysarthria and anarthria    THERAPY DIAG:  Dysarthria and anarthria  Rationale for Evaluation and Treatment: Rehabilitation  SUBJECTIVE:   SUBJECTIVE STATEMENT: "Everybody else talks so fast." Pt accompanied by: self  PERTINENT HISTORY: [facility] indicates PCP noted that "patient had an evidence of ischemic roof of the palette." He was sent to ENT to "perform histopathology and 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 Patient received IV treatment for anti  mycotic therapy with surprisingly negative culture..Patient is on permanent anti mycotic therapy despite having facial reconstruction."  Pt explains to SLP today that May 29, 2022 Dr. Vear Clock at [facility] operated on his oral cavity and reconstructed upper gum and hard palate affected by the bacterial infection.   PAIN:  Are you having pain? Yes: NPRS scale: 4/10 Pain location: back Pain description: sore  FALLS: Has patient  fallen in last 6 months?  Yes, Number of falls: 2 (once due to dehydration, the other due to mis step carrying groceries to the house)  LIVING ENVIRONMENT: Lives with: lives with their spouse Lives in: House/apartment  PLOF:  Level of assistance: Independent with ADLs, Independent with IADLs Employment: Retired  PATIENT GOALS: "Talk   OBJECTIVE:   COGNITION: Overall cognitive status: Within functional limits for tasks assessed  MOTOR SPEECH: Overall motor speech: impaired - /s/, /z/, /ks/, "j", "ch" affected in all positions, but articulation improves pt's intelligibility if pt talks at a slower rate. Further, when pt speaks at this slower rate, phonemic context is easier for the listener to attain. Level of impairment: Word Respiration: thoracic breathing Phonation: normal Resonance:  oral resonance is impacted by surgical changes Articulation: Impaired: word Intelligibility: Intelligible 98% today in conversation Effective technique: slow rate and over articulate  ORAL MOTOR EXAMINATION: Overall status: Impaired: Labial: Right (ROM and Strength) Comments: upper labial movement affected by surgical changes. Rt labial strength impaired minimially. SLP appreciates a large mass of tissue in pt's mostly rt, mostly anterior and mid hard palatal-area.  Pt does not report troublesome difficulty with swallowing, which does not warrant further evaluation. He does report some differences in oral manipulation and bolus propulsion however currently has mainly very soft foods due to no upper teeth.  PATIENT REPORTED OUTCOME MEASURES (PROM): CES to be completed upon initiation of therapy  TODAY'S TREATMENT:                                                                                                                                         DATE:  11/18/22: SLP addressed pt's problem phonemes with pt as above in "motor speech" and educated pt re: rationale for slowed rate and that this overall  incr in inteligibility improves phonemic context for other words that may not be 100% correctly articulated. Discussed evaluation results and role of SLP in possible further ST. In discussion about pt's current oral structural status, he conveyed he would like to talk with his surgeon about further revisions and how they may improve or not improve pt's speech, before committing to more skilled ST. SLP and pt agreed that after that appointment pt would contact clinic if interested in further skilled ST.    PATIENT EDUCATION: Education details: see "today's treatment" Person educated: Patient Education method: Explanation Education comprehension: verbalized understanding  HOME EXERCISE PROGRAM: Continue to work on slowing rate of speech in order to improve  articulation especially with target sounds discussed today in "today's treatment".   GOALS: Goals reviewed with patient? Yes  SHORT TERM GOALS: Target date: TBD  Pt will demo speech intelligibility of 100% using compensations in 15 minutes mod complex conversation in 3 sessions Baseline: Goal status: INITIAL  2.  Pt will demo functional use of phone by reporting at least 95% intelligibility using speech compensations between 3 sessions Baseline:  Goal status: INITIAL  3.  Pt will complete HEP with at least 85% functional articulation with target phonemes at the sentence level in 3 sessions Baseline:  Goal status: INITIAL    LONG TERM GOALS: Target date: TBD  Pt will improve PROM when measured in last 1-2 sessions compared with initial measurement Baseline:  Goal status: INITIAL  2.  Pt will demo speech intelligibility of 100% using compensations in 20 minutes mod complex conversation in 3 sessions Baseline:  Goal status: INITIAL  3.  Pt will complete HEP with at least 90% functional articulation with target phonemes at the sentence level in 2 consecutive sessions Baseline:  Goal status: INITIAL  4.  Pt will report he  feels more a part of conversations than at initiation of ST Baseline:  Goal status: INITIAL    ASSESSMENT:  CLINICAL IMPRESSION: Patient is a 67 y.o. M who was seen today for assessment of dysarthria following a fungal infection and necessary oral reconstructive surgery. His intelligibility is very good presently and SLP is curious if pt's oral articulatory movements would have greater success after a revision sx of pt's oral cavity, as his reconstructive tissue is rather large and inhibits lingual movement with above phonemes in "motor speech". Pt would like to speak with his surgeon later this month before contacting the clinic to initiate further skilled ST. SLP to leave pt's chart open for 30 days.   OBJECTIVE IMPAIRMENTS: Objective impairments include dysarthria and dysphagia. These impairments are limiting patient from ADLs/IADLs, effectively communicating at home and in community, and safety when swallowing.Factors affecting potential to achieve goals and functional outcome are co-morbidities, medical prognosis, and severity of impairments.. Patient will benefit from skilled SLP services to address above impairments and improve overall function.  REHAB POTENTIAL: Fair dependent upon structural surgical changes  PLAN:  SLP FREQUENCY: 1-2x/week  SLP DURATION: 8 weeks  PLANNED INTERVENTIONS: Diet toleration management , Environmental controls, Cueing hierachy, Internal/external aids, Oral motor exercises, Functional tasks, Multimodal communication approach, SLP instruction and feedback, Compensatory strategies, and Patient/family education    [facility], CCC-SLP 11/18/2022, 5:46 PM

## 2023-02-09 DIAGNOSIS — Z9889 Other specified postprocedural states: Secondary | ICD-10-CM | POA: Insufficient documentation

## 2023-10-03 DIAGNOSIS — Z7682 Awaiting organ transplant status: Secondary | ICD-10-CM | POA: Insufficient documentation

## 2023-10-03 DIAGNOSIS — M129 Arthropathy, unspecified: Secondary | ICD-10-CM | POA: Insufficient documentation

## 2023-10-03 DIAGNOSIS — F4329 Adjustment disorder with other symptoms: Secondary | ICD-10-CM | POA: Insufficient documentation

## 2023-10-03 DIAGNOSIS — F4310 Post-traumatic stress disorder, unspecified: Secondary | ICD-10-CM | POA: Insufficient documentation

## 2023-10-03 DIAGNOSIS — N186 End stage renal disease: Secondary | ICD-10-CM | POA: Insufficient documentation

## 2023-10-03 DIAGNOSIS — L2089 Other atopic dermatitis: Secondary | ICD-10-CM | POA: Insufficient documentation

## 2023-10-03 DIAGNOSIS — G629 Polyneuropathy, unspecified: Secondary | ICD-10-CM | POA: Insufficient documentation

## 2023-10-03 DIAGNOSIS — D649 Anemia, unspecified: Secondary | ICD-10-CM | POA: Insufficient documentation

## 2023-10-03 DIAGNOSIS — I10 Essential (primary) hypertension: Secondary | ICD-10-CM | POA: Insufficient documentation

## 2023-10-03 DIAGNOSIS — M1712 Unilateral primary osteoarthritis, left knee: Secondary | ICD-10-CM | POA: Insufficient documentation

## 2023-10-03 DIAGNOSIS — R112 Nausea with vomiting, unspecified: Secondary | ICD-10-CM | POA: Insufficient documentation

## 2023-10-03 DIAGNOSIS — E291 Testicular hypofunction: Secondary | ICD-10-CM | POA: Insufficient documentation

## 2023-10-03 DIAGNOSIS — F32A Depression, unspecified: Secondary | ICD-10-CM | POA: Insufficient documentation

## 2023-10-03 DIAGNOSIS — N528 Other male erectile dysfunction: Secondary | ICD-10-CM | POA: Insufficient documentation

## 2023-10-03 DIAGNOSIS — D72819 Decreased white blood cell count, unspecified: Secondary | ICD-10-CM | POA: Insufficient documentation

## 2023-10-03 DIAGNOSIS — Z0120 Encounter for dental examination and cleaning without abnormal findings: Secondary | ICD-10-CM | POA: Insufficient documentation

## 2023-10-03 DIAGNOSIS — Z1330 Encounter for screening examination for mental health and behavioral disorders, unspecified: Secondary | ICD-10-CM | POA: Insufficient documentation

## 2023-10-03 DIAGNOSIS — Z136 Encounter for screening for cardiovascular disorders: Secondary | ICD-10-CM | POA: Insufficient documentation

## 2023-10-03 DIAGNOSIS — M25511 Pain in right shoulder: Secondary | ICD-10-CM | POA: Insufficient documentation

## 2023-10-03 DIAGNOSIS — R627 Adult failure to thrive: Secondary | ICD-10-CM | POA: Insufficient documentation

## 2023-10-03 DIAGNOSIS — H9190 Unspecified hearing loss, unspecified ear: Secondary | ICD-10-CM | POA: Insufficient documentation

## 2023-10-03 DIAGNOSIS — M545 Low back pain, unspecified: Secondary | ICD-10-CM | POA: Insufficient documentation

## 2023-10-03 DIAGNOSIS — M609 Myositis, unspecified: Secondary | ICD-10-CM | POA: Insufficient documentation

## 2023-10-03 DIAGNOSIS — K760 Fatty (change of) liver, not elsewhere classified: Secondary | ICD-10-CM | POA: Insufficient documentation

## 2023-10-03 DIAGNOSIS — L739 Follicular disorder, unspecified: Secondary | ICD-10-CM | POA: Insufficient documentation

## 2023-10-03 DIAGNOSIS — K7460 Unspecified cirrhosis of liver: Secondary | ICD-10-CM | POA: Insufficient documentation

## 2023-10-03 DIAGNOSIS — K027 Dental root caries: Secondary | ICD-10-CM | POA: Insufficient documentation

## 2023-10-03 DIAGNOSIS — Z5982 Transportation insecurity: Secondary | ICD-10-CM | POA: Insufficient documentation

## 2023-10-03 DIAGNOSIS — Z9889 Other specified postprocedural states: Secondary | ICD-10-CM | POA: Insufficient documentation

## 2023-10-03 DIAGNOSIS — I2510 Atherosclerotic heart disease of native coronary artery without angina pectoris: Secondary | ICD-10-CM | POA: Insufficient documentation

## 2023-10-03 DIAGNOSIS — M6281 Muscle weakness (generalized): Secondary | ICD-10-CM | POA: Insufficient documentation

## 2023-10-03 DIAGNOSIS — Z7189 Other specified counseling: Secondary | ICD-10-CM | POA: Insufficient documentation

## 2023-10-03 DIAGNOSIS — Z719 Counseling, unspecified: Secondary | ICD-10-CM | POA: Insufficient documentation

## 2023-10-03 DIAGNOSIS — R162 Hepatomegaly with splenomegaly, not elsewhere classified: Secondary | ICD-10-CM | POA: Insufficient documentation

## 2023-10-03 DIAGNOSIS — R4789 Other speech disturbances: Secondary | ICD-10-CM | POA: Insufficient documentation

## 2023-10-03 DIAGNOSIS — M2010 Hallux valgus (acquired), unspecified foot: Secondary | ICD-10-CM | POA: Insufficient documentation

## 2023-10-03 DIAGNOSIS — F331 Major depressive disorder, recurrent, moderate: Secondary | ICD-10-CM | POA: Insufficient documentation

## 2023-10-03 DIAGNOSIS — Z741 Need for assistance with personal care: Secondary | ICD-10-CM | POA: Insufficient documentation

## 2023-10-03 DIAGNOSIS — M4802 Spinal stenosis, cervical region: Secondary | ICD-10-CM | POA: Insufficient documentation

## 2023-10-03 DIAGNOSIS — M8700 Idiopathic aseptic necrosis of unspecified bone: Secondary | ICD-10-CM | POA: Insufficient documentation

## 2023-10-03 DIAGNOSIS — M109 Gout, unspecified: Secondary | ICD-10-CM | POA: Insufficient documentation

## 2023-10-03 DIAGNOSIS — E2749 Other adrenocortical insufficiency: Secondary | ICD-10-CM | POA: Insufficient documentation

## 2023-10-03 DIAGNOSIS — Z710 Person encountering health services to consult on behalf of another person: Secondary | ICD-10-CM | POA: Insufficient documentation

## 2023-10-03 DIAGNOSIS — E559 Vitamin D deficiency, unspecified: Secondary | ICD-10-CM | POA: Insufficient documentation

## 2023-10-03 DIAGNOSIS — B449 Aspergillosis, unspecified: Secondary | ICD-10-CM | POA: Insufficient documentation

## 2023-10-03 DIAGNOSIS — K529 Noninfective gastroenteritis and colitis, unspecified: Secondary | ICD-10-CM | POA: Insufficient documentation

## 2023-10-03 DIAGNOSIS — I8500 Esophageal varices without bleeding: Secondary | ICD-10-CM | POA: Insufficient documentation

## 2023-10-03 DIAGNOSIS — Z7729 Contact with and (suspected ) exposure to other hazardous substances: Secondary | ICD-10-CM | POA: Insufficient documentation

## 2023-10-03 DIAGNOSIS — Z83719 Family history of colon polyps, unspecified: Secondary | ICD-10-CM | POA: Insufficient documentation

## 2023-10-03 DIAGNOSIS — D689 Coagulation defect, unspecified: Secondary | ICD-10-CM | POA: Insufficient documentation

## 2023-10-03 DIAGNOSIS — Z659 Problem related to unspecified psychosocial circumstances: Secondary | ICD-10-CM | POA: Insufficient documentation

## 2023-10-03 DIAGNOSIS — R269 Unspecified abnormalities of gait and mobility: Secondary | ICD-10-CM | POA: Insufficient documentation

## 2023-10-03 DIAGNOSIS — E785 Hyperlipidemia, unspecified: Secondary | ICD-10-CM | POA: Insufficient documentation

## 2023-10-03 DIAGNOSIS — F439 Reaction to severe stress, unspecified: Secondary | ICD-10-CM | POA: Insufficient documentation

## 2023-10-03 DIAGNOSIS — N189 Chronic kidney disease, unspecified: Secondary | ICD-10-CM | POA: Insufficient documentation

## 2023-10-03 DIAGNOSIS — M726 Necrotizing fasciitis: Secondary | ICD-10-CM | POA: Insufficient documentation

## 2023-10-03 DIAGNOSIS — Z978 Presence of other specified devices: Secondary | ICD-10-CM | POA: Insufficient documentation

## 2023-10-03 DIAGNOSIS — F321 Major depressive disorder, single episode, moderate: Secondary | ICD-10-CM | POA: Insufficient documentation

## 2023-10-03 DIAGNOSIS — Z01818 Encounter for other preprocedural examination: Secondary | ICD-10-CM | POA: Insufficient documentation

## 2023-10-03 DIAGNOSIS — E119 Type 2 diabetes mellitus without complications: Secondary | ICD-10-CM | POA: Insufficient documentation

## 2023-10-03 DIAGNOSIS — J019 Acute sinusitis, unspecified: Secondary | ICD-10-CM | POA: Insufficient documentation

## 2023-10-03 DIAGNOSIS — B4489 Other forms of aspergillosis: Secondary | ICD-10-CM | POA: Insufficient documentation

## 2023-10-03 DIAGNOSIS — R1310 Dysphagia, unspecified: Secondary | ICD-10-CM | POA: Insufficient documentation

## 2023-10-03 DIAGNOSIS — E782 Mixed hyperlipidemia: Secondary | ICD-10-CM | POA: Insufficient documentation

## 2023-10-03 DIAGNOSIS — L219 Seborrheic dermatitis, unspecified: Secondary | ICD-10-CM | POA: Insufficient documentation

## 2023-10-03 DIAGNOSIS — E2740 Unspecified adrenocortical insufficiency: Secondary | ICD-10-CM | POA: Insufficient documentation

## 2023-10-03 DIAGNOSIS — M79672 Pain in left foot: Secondary | ICD-10-CM | POA: Insufficient documentation

## 2023-10-03 DIAGNOSIS — R609 Edema, unspecified: Secondary | ICD-10-CM | POA: Insufficient documentation

## 2023-10-03 DIAGNOSIS — H01009 Unspecified blepharitis unspecified eye, unspecified eyelid: Secondary | ICD-10-CM | POA: Insufficient documentation

## 2023-11-07 ENCOUNTER — Emergency Department (HOSPITAL_COMMUNITY)

## 2023-11-07 ENCOUNTER — Encounter: Payer: Self-pay | Admitting: Oncology

## 2023-11-07 ENCOUNTER — Inpatient Hospital Stay (HOSPITAL_COMMUNITY)
Admission: EM | Admit: 2023-11-07 | Discharge: 2023-11-11 | DRG: 321 | Disposition: A | Discharge status: Home Health | Attending: Cardiovascular Disease | Admitting: Cardiovascular Disease

## 2023-11-07 ENCOUNTER — Other Ambulatory Visit: Payer: Self-pay

## 2023-11-07 ENCOUNTER — Encounter (HOSPITAL_COMMUNITY)
Admission: EM | Disposition: A | Discharge status: Home Health | Payer: Self-pay | Source: Home / Self Care | Attending: Cardiology

## 2023-11-07 ENCOUNTER — Encounter (HOSPITAL_COMMUNITY): Payer: Self-pay | Admitting: Cardiovascular Disease

## 2023-11-07 ENCOUNTER — Inpatient Hospital Stay (HOSPITAL_COMMUNITY)

## 2023-11-07 DIAGNOSIS — Z79624 Long term (current) use of inhibitors of nucleotide synthesis: Secondary | ICD-10-CM

## 2023-11-07 DIAGNOSIS — E1165 Type 2 diabetes mellitus with hyperglycemia: Secondary | ICD-10-CM | POA: Diagnosis present

## 2023-11-07 DIAGNOSIS — I2510 Atherosclerotic heart disease of native coronary artery without angina pectoris: Secondary | ICD-10-CM | POA: Diagnosis present

## 2023-11-07 DIAGNOSIS — Z951 Presence of aortocoronary bypass graft: Secondary | ICD-10-CM

## 2023-11-07 DIAGNOSIS — I442 Atrioventricular block, complete: Secondary | ICD-10-CM | POA: Diagnosis present

## 2023-11-07 DIAGNOSIS — Z981 Arthrodesis status: Secondary | ICD-10-CM | POA: Diagnosis not present

## 2023-11-07 DIAGNOSIS — E1122 Type 2 diabetes mellitus with diabetic chronic kidney disease: Secondary | ICD-10-CM | POA: Diagnosis present

## 2023-11-07 DIAGNOSIS — I959 Hypotension, unspecified: Secondary | ICD-10-CM | POA: Diagnosis present

## 2023-11-07 DIAGNOSIS — N1832 Chronic kidney disease, stage 3b: Secondary | ICD-10-CM | POA: Diagnosis present

## 2023-11-07 DIAGNOSIS — D84821 Immunodeficiency due to drugs: Secondary | ICD-10-CM | POA: Diagnosis present

## 2023-11-07 DIAGNOSIS — I213 ST elevation (STEMI) myocardial infarction of unspecified site: Secondary | ICD-10-CM | POA: Diagnosis present

## 2023-11-07 DIAGNOSIS — Z888 Allergy status to other drugs, medicaments and biological substances status: Secondary | ICD-10-CM

## 2023-11-07 DIAGNOSIS — I2584 Coronary atherosclerosis due to calcified coronary lesion: Secondary | ICD-10-CM | POA: Diagnosis present

## 2023-11-07 DIAGNOSIS — I2111 ST elevation (STEMI) myocardial infarction involving right coronary artery: Secondary | ICD-10-CM | POA: Diagnosis present

## 2023-11-07 DIAGNOSIS — Z9889 Other specified postprocedural states: Secondary | ICD-10-CM

## 2023-11-07 DIAGNOSIS — Z7952 Long term (current) use of systemic steroids: Secondary | ICD-10-CM | POA: Diagnosis not present

## 2023-11-07 DIAGNOSIS — I472 Ventricular tachycardia, unspecified: Secondary | ICD-10-CM | POA: Diagnosis present

## 2023-11-07 DIAGNOSIS — Z79623 Long term (current) use of mammalian target of rapamycin (mtor) inhibitor: Secondary | ICD-10-CM

## 2023-11-07 DIAGNOSIS — E782 Mixed hyperlipidemia: Secondary | ICD-10-CM | POA: Diagnosis present

## 2023-11-07 DIAGNOSIS — I130 Hypertensive heart and chronic kidney disease with heart failure and stage 1 through stage 4 chronic kidney disease, or unspecified chronic kidney disease: Secondary | ICD-10-CM | POA: Diagnosis present

## 2023-11-07 DIAGNOSIS — E7800 Pure hypercholesterolemia, unspecified: Secondary | ICD-10-CM | POA: Diagnosis not present

## 2023-11-07 DIAGNOSIS — Z7984 Long term (current) use of oral hypoglycemic drugs: Secondary | ICD-10-CM

## 2023-11-07 DIAGNOSIS — Z944 Liver transplant status: Secondary | ICD-10-CM

## 2023-11-07 DIAGNOSIS — I5021 Acute systolic (congestive) heart failure: Secondary | ICD-10-CM | POA: Diagnosis present

## 2023-11-07 DIAGNOSIS — Z7902 Long term (current) use of antithrombotics/antiplatelets: Secondary | ICD-10-CM | POA: Diagnosis not present

## 2023-11-07 DIAGNOSIS — Z7982 Long term (current) use of aspirin: Secondary | ICD-10-CM

## 2023-11-07 DIAGNOSIS — Z79899 Other long term (current) drug therapy: Secondary | ICD-10-CM

## 2023-11-07 DIAGNOSIS — N189 Chronic kidney disease, unspecified: Secondary | ICD-10-CM | POA: Diagnosis present

## 2023-11-07 DIAGNOSIS — E785 Hyperlipidemia, unspecified: Secondary | ICD-10-CM | POA: Diagnosis not present

## 2023-11-07 DIAGNOSIS — I10 Essential (primary) hypertension: Secondary | ICD-10-CM | POA: Diagnosis present

## 2023-11-07 DIAGNOSIS — Z955 Presence of coronary angioplasty implant and graft: Secondary | ICD-10-CM

## 2023-11-07 HISTORY — PX: CORONARY/GRAFT ACUTE MI REVASCULARIZATION: CATH118305

## 2023-11-07 HISTORY — PX: LEFT HEART CATH AND CORONARY ANGIOGRAPHY: CATH118249

## 2023-11-07 HISTORY — DX: Presence of aortocoronary bypass graft: Z95.1

## 2023-11-07 HISTORY — DX: Liver transplant status: Z94.4

## 2023-11-07 LAB — I-STAT CHEM 8, ED
BUN: 26 mg/dL — ABNORMAL HIGH (ref 8–23)
Calcium, Ion: 1.07 mmol/L — ABNORMAL LOW (ref 1.15–1.40)
Chloride: 106 mmol/L (ref 98–111)
Creatinine, Ser: 2.5 mg/dL — ABNORMAL HIGH (ref 0.61–1.24)
Glucose, Bld: 215 mg/dL — ABNORMAL HIGH (ref 70–99)
HCT: 32 % — ABNORMAL LOW (ref 39.0–52.0)
Hemoglobin: 10.9 g/dL — ABNORMAL LOW (ref 13.0–17.0)
Potassium: 3.8 mmol/L (ref 3.5–5.1)
Sodium: 137 mmol/L (ref 135–145)
TCO2: 20 mmol/L — ABNORMAL LOW (ref 22–32)

## 2023-11-07 LAB — CBC WITH DIFFERENTIAL/PLATELET
Abs Immature Granulocytes: 0 10*3/uL (ref 0.00–0.07)
Basophils Absolute: 0 10*3/uL (ref 0.0–0.1)
Basophils Relative: 1 %
Eosinophils Absolute: 0 10*3/uL (ref 0.0–0.5)
Eosinophils Relative: 1 %
HCT: 34 % — ABNORMAL LOW (ref 39.0–52.0)
Hemoglobin: 11.4 g/dL — ABNORMAL LOW (ref 13.0–17.0)
Immature Granulocytes: 0 %
Lymphocytes Relative: 29 %
Lymphs Abs: 0.8 10*3/uL (ref 0.7–4.0)
MCH: 28.4 pg (ref 26.0–34.0)
MCHC: 33.5 g/dL (ref 30.0–36.0)
MCV: 84.8 fL (ref 80.0–100.0)
Monocytes Absolute: 0.3 10*3/uL (ref 0.1–1.0)
Monocytes Relative: 10 %
Neutro Abs: 1.6 10*3/uL — ABNORMAL LOW (ref 1.7–7.7)
Neutrophils Relative %: 59 %
Platelets: 110 10*3/uL — ABNORMAL LOW (ref 150–400)
RBC: 4.01 MIL/uL — ABNORMAL LOW (ref 4.22–5.81)
RDW: 13.3 % (ref 11.5–15.5)
WBC: 2.7 10*3/uL — ABNORMAL LOW (ref 4.0–10.5)
nRBC: 0 % (ref 0.0–0.2)

## 2023-11-07 LAB — CREATININE, SERUM
Creatinine, Ser: 2.37 mg/dL — ABNORMAL HIGH (ref 0.61–1.24)
GFR, Estimated: 29 mL/min — ABNORMAL LOW (ref >60)

## 2023-11-07 LAB — POCT ACTIVATED CLOTTING TIME: Activated Clotting Time: 291 s

## 2023-11-07 LAB — PROTIME-INR
INR: 1 (ref 0.8–1.2)
Prothrombin Time: 13.7 s (ref 11.4–15.2)

## 2023-11-07 LAB — COMPREHENSIVE METABOLIC PANEL WITH GFR
ALT: 20 U/L (ref 0–44)
AST: 18 U/L (ref 15–41)
Albumin: 3.7 g/dL (ref 3.5–5.0)
Alkaline Phosphatase: 122 U/L (ref 38–126)
Anion gap: 14 (ref 5–15)
BUN: 24 mg/dL — ABNORMAL HIGH (ref 8–23)
CO2: 19 mmol/L — ABNORMAL LOW (ref 22–32)
Calcium: 9.1 mg/dL (ref 8.9–10.3)
Chloride: 104 mmol/L (ref 98–111)
Creatinine, Ser: 2.35 mg/dL — ABNORMAL HIGH (ref 0.61–1.24)
GFR, Estimated: 29 mL/min — ABNORMAL LOW (ref >60)
Glucose, Bld: 218 mg/dL — ABNORMAL HIGH (ref 70–99)
Potassium: 3.9 mmol/L (ref 3.5–5.1)
Sodium: 137 mmol/L (ref 135–145)
Total Bilirubin: 0.3 mg/dL (ref 0.0–1.2)
Total Protein: 6.3 g/dL — ABNORMAL LOW (ref 6.5–8.1)

## 2023-11-07 LAB — MRSA NEXT GEN BY PCR, NASAL: MRSA by PCR Next Gen: NOT DETECTED

## 2023-11-07 LAB — TROPONIN I (HIGH SENSITIVITY)
Troponin I (High Sensitivity): 8 ng/L (ref <18)
Troponin I (High Sensitivity): >24000 ng/L (ref <18)

## 2023-11-07 LAB — CBC
HCT: 33.8 % — ABNORMAL LOW (ref 39.0–52.0)
Hemoglobin: 11.4 g/dL — ABNORMAL LOW (ref 13.0–17.0)
MCH: 28.6 pg (ref 26.0–34.0)
MCHC: 33.7 g/dL (ref 30.0–36.0)
MCV: 84.9 fL (ref 80.0–100.0)
Platelets: 129 10*3/uL — ABNORMAL LOW (ref 150–400)
RBC: 3.98 MIL/uL — ABNORMAL LOW (ref 4.22–5.81)
RDW: 13.3 % (ref 11.5–15.5)
WBC: 4.5 10*3/uL (ref 4.0–10.5)
nRBC: 0 % (ref 0.0–0.2)

## 2023-11-07 LAB — LIPID PANEL
Cholesterol: 284 mg/dL — ABNORMAL HIGH (ref 0–200)
HDL: 28 mg/dL — ABNORMAL LOW (ref >40)
LDL Cholesterol: UNDETERMINED mg/dL (ref 0–99)
Total CHOL/HDL Ratio: 10.1 ratio
Triglycerides: 708 mg/dL — ABNORMAL HIGH (ref <150)
VLDL: UNDETERMINED mg/dL (ref 0–40)

## 2023-11-07 LAB — LDL CHOLESTEROL, DIRECT: Direct LDL: 160 mg/dL — ABNORMAL HIGH (ref 0–99)

## 2023-11-07 LAB — HIV ANTIBODY (ROUTINE TESTING W REFLEX): HIV Screen 4th Generation wRfx: NONREACTIVE

## 2023-11-07 LAB — APTT: aPTT: 30 s (ref 24–36)

## 2023-11-07 LAB — I-STAT CG4 LACTIC ACID, ED: Lactic Acid, Venous: 1.8 mmol/L (ref 0.5–1.9)

## 2023-11-07 LAB — CG4 I-STAT (LACTIC ACID): Lactic Acid, Venous: 1.5 mmol/L (ref 0.5–1.9)

## 2023-11-07 SURGERY — CORONARY/GRAFT ACUTE MI REVASCULARIZATION
Anesthesia: LOCAL

## 2023-11-07 MED ORDER — LIDOCAINE HCL (PF) 1 % IJ SOLN
INTRAMUSCULAR | Status: AC
  Filled 2023-11-07: qty 30

## 2023-11-07 MED ORDER — SODIUM CHLORIDE 0.9% FLUSH: 3.0000 mL | INTRAVENOUS | Status: DC | PRN

## 2023-11-07 MED ORDER — NITROGLYCERIN 0.4 MG SL SUBL: 0.4000 mg | SUBLINGUAL_TABLET | SUBLINGUAL | Status: DC | PRN

## 2023-11-07 MED ORDER — SODIUM CHLORIDE 0.9% FLUSH: 3.0000 mL | Freq: Two times a day (BID) | INTRAVENOUS | Status: DC

## 2023-11-07 MED ORDER — SODIUM CHLORIDE 0.9 % IV SOLN: INTRAVENOUS | Status: AC

## 2023-11-07 MED ORDER — NITROGLYCERIN 1 MG/10 ML FOR IR/CATH LAB
INTRA_ARTERIAL | Status: AC
  Filled 2023-11-07: qty 10

## 2023-11-07 MED ORDER — HYDROCORTISONE 10 MG PO TABS
10.0000 mg | ORAL_TABLET | Freq: Every day | ORAL | Status: DC
  Administered 2023-11-07 – 2023-11-11 (×5): 10 mg via ORAL
  Filled 2023-11-07 (×6): qty 1

## 2023-11-07 MED ORDER — TICAGRELOR 90 MG PO TABS
90.0000 mg | ORAL_TABLET | Freq: Two times a day (BID) | ORAL | Status: DC
  Administered 2023-11-07 – 2023-11-11 (×8): 90 mg via ORAL
  Filled 2023-11-07 (×8): qty 1

## 2023-11-07 MED ORDER — ONDANSETRON HCL 4 MG/2ML IJ SOLN
4.0000 mg | Freq: Once | INTRAMUSCULAR | Status: AC
  Administered 2023-11-07: 4 mg via INTRAVENOUS

## 2023-11-07 MED ORDER — FENTANYL CITRATE (PF) 100 MCG/2ML IJ SOLN
INTRAMUSCULAR | Status: AC
  Filled 2023-11-07: qty 2

## 2023-11-07 MED ORDER — DOXYCYCLINE HYCLATE 100 MG PO TABS
100.0000 mg | ORAL_TABLET | Freq: Two times a day (BID) | ORAL | Status: DC
  Administered 2023-11-07 – 2023-11-08 (×2): 100 mg via ORAL
  Filled 2023-11-07 (×2): qty 1

## 2023-11-07 MED ORDER — ACETAMINOPHEN 325 MG PO TABS: 650.0000 mg | ORAL_TABLET | ORAL | Status: DC | PRN

## 2023-11-07 MED ORDER — DOXYCYCLINE MONOHYDRATE 100 MG PO TABS: 100.0000 mg | ORAL_TABLET | Freq: Every day | ORAL | Status: DC

## 2023-11-07 MED ORDER — HYDROCORTISONE 5 MG PO TABS
5.0000 mg | ORAL_TABLET | Freq: Every day | ORAL | Status: DC
  Administered 2023-11-07 – 2023-11-10 (×4): 5 mg via ORAL
  Filled 2023-11-07 (×5): qty 1

## 2023-11-07 MED ORDER — ENOXAPARIN SODIUM 30 MG/0.3ML IJ SOSY
30.0000 mg | PREFILLED_SYRINGE | INTRAMUSCULAR | Status: DC
  Administered 2023-11-08: 30 mg via SUBCUTANEOUS
  Filled 2023-11-07: qty 0.3

## 2023-11-07 MED ORDER — FENTANYL CITRATE (PF) 100 MCG/2ML IJ SOLN
INTRAMUSCULAR | Status: DC | PRN
  Administered 2023-11-07: 25 ug via INTRAVENOUS

## 2023-11-07 MED ORDER — VERAPAMIL HCL 2.5 MG/ML IV SOLN
INTRAVENOUS | Status: AC
  Filled 2023-11-07: qty 2

## 2023-11-07 MED ORDER — MIDAZOLAM HCL 2 MG/2ML IJ SOLN
INTRAMUSCULAR | Status: DC | PRN
  Administered 2023-11-07: 1 mg via INTRAVENOUS

## 2023-11-07 MED ORDER — HEPARIN SODIUM (PORCINE) 1000 UNIT/ML IJ SOLN
INTRAMUSCULAR | Status: AC
  Filled 2023-11-07: qty 10

## 2023-11-07 MED ORDER — MORPHINE SULFATE (PF) 4 MG/ML IV SOLN
4.0000 mg | Freq: Once | INTRAVENOUS | Status: AC
  Administered 2023-11-07: 4 mg via INTRAVENOUS
  Filled 2023-11-07: qty 1

## 2023-11-07 MED ORDER — SODIUM CHLORIDE 0.9 % IV SOLN
250.0000 mL | INTRAVENOUS | Status: AC | PRN
Start: 2023-11-07 — End: 2023-11-08

## 2023-11-07 MED ORDER — NOREPINEPHRINE BITARTRATE 1 MG/ML IV SOLN
INTRAVENOUS | Status: DC | PRN
  Administered 2023-11-07: 4.8 ug/min via INTRAVENOUS

## 2023-11-07 MED ORDER — HYDRALAZINE HCL 20 MG/ML IJ SOLN
10.0000 mg | INTRAMUSCULAR | Status: AC | PRN
Start: 2023-11-07 — End: 2023-11-07

## 2023-11-07 MED ORDER — VERAPAMIL HCL 2.5 MG/ML IV SOLN
INTRAVENOUS | Status: DC | PRN
  Administered 2023-11-07: 10 mL via INTRA_ARTERIAL

## 2023-11-07 MED ORDER — PANTOPRAZOLE SODIUM 40 MG PO TBEC
40.0000 mg | DELAYED_RELEASE_TABLET | Freq: Every day | ORAL | Status: DC
  Administered 2023-11-07 – 2023-11-11 (×5): 40 mg via ORAL
  Filled 2023-11-07 (×5): qty 1

## 2023-11-07 MED ORDER — CHLORHEXIDINE GLUCONATE CLOTH 2 % EX PADS
6.0000 | MEDICATED_PAD | Freq: Every day | CUTANEOUS | Status: DC
  Administered 2023-11-07 – 2023-11-10 (×4): 6 via TOPICAL

## 2023-11-07 MED ORDER — ASPIRIN 81 MG PO TBEC
81.0000 mg | DELAYED_RELEASE_TABLET | Freq: Every day | ORAL | Status: DC
  Administered 2023-11-08 – 2023-11-11 (×4): 81 mg via ORAL
  Filled 2023-11-07 (×4): qty 1

## 2023-11-07 MED ORDER — TICAGRELOR 90 MG PO TABS
ORAL_TABLET | ORAL | Status: DC | PRN
  Administered 2023-11-07: 180 mg via ORAL

## 2023-11-07 MED ORDER — SIROLIMUS 1 MG PO TABS
1.0000 mg | ORAL_TABLET | Freq: Every day | ORAL | Status: DC
  Filled 2023-11-07: qty 1

## 2023-11-07 MED ORDER — HEPARIN (PORCINE) IN NACL 1000-0.9 UT/500ML-% IV SOLN
INTRAVENOUS | Status: DC | PRN
  Administered 2023-11-07: 500 mL

## 2023-11-07 MED ORDER — HYDROCORTISONE 5 MG PO TABS: 5.0000 mg | ORAL_TABLET | ORAL | Status: DC

## 2023-11-07 MED ORDER — SODIUM CHLORIDE 0.9 % IV SOLN
INTRAVENOUS | Status: AC | PRN
  Administered 2023-11-07: 10 mL/h via INTRAVENOUS

## 2023-11-07 MED ORDER — MYCOPHENOLATE MOFETIL 250 MG PO CAPS
250.0000 mg | ORAL_CAPSULE | Freq: Two times a day (BID) | ORAL | Status: DC
  Administered 2023-11-07 – 2023-11-11 (×9): 250 mg via ORAL
  Filled 2023-11-07 (×10): qty 1

## 2023-11-07 MED ORDER — ONDANSETRON HCL 4 MG/2ML IJ SOLN: 4.0000 mg | Freq: Four times a day (QID) | INTRAMUSCULAR | Status: DC | PRN

## 2023-11-07 MED ORDER — SIROLIMUS 0.5 MG PO TABS
1.0000 mg | ORAL_TABLET | Freq: Every day | ORAL | Status: DC
  Administered 2023-11-07 – 2023-11-11 (×5): 1 mg via ORAL
  Filled 2023-11-07 (×5): qty 2

## 2023-11-07 MED ORDER — MIDAZOLAM HCL 2 MG/2ML IJ SOLN
INTRAMUSCULAR | Status: AC
  Filled 2023-11-07: qty 2

## 2023-11-07 MED ORDER — HEPARIN SODIUM (PORCINE) 1000 UNIT/ML IJ SOLN
INTRAMUSCULAR | Status: DC | PRN
  Administered 2023-11-07: 3000 [IU] via INTRAVENOUS
  Administered 2023-11-07: 6000 [IU] via INTRAVENOUS

## 2023-11-07 MED ORDER — ISAVUCONAZONIUM SULFATE 186 MG PO CAPS
372.0000 mg | ORAL_CAPSULE | Freq: Every day | ORAL | Status: DC
  Administered 2023-11-07 – 2023-11-11 (×5): 372 mg via ORAL
  Filled 2023-11-07 (×8): qty 2

## 2023-11-07 MED ORDER — HEPARIN SODIUM (PORCINE) 5000 UNIT/ML IJ SOLN: 60.0000 [IU]/kg | Freq: Once | INTRAMUSCULAR | Status: DC

## 2023-11-07 MED ORDER — HEPARIN SODIUM (PORCINE) 5000 UNIT/ML IJ SOLN
4000.0000 [IU] | Freq: Once | INTRAMUSCULAR | Status: AC
  Administered 2023-11-07: 4000 [IU] via INTRAVENOUS

## 2023-11-07 MED ORDER — LABETALOL HCL 5 MG/ML IV SOLN: 10.0000 mg | INTRAVENOUS | Status: AC | PRN

## 2023-11-07 MED ORDER — SERTRALINE HCL 100 MG PO TABS
100.0000 mg | ORAL_TABLET | Freq: Every day | ORAL | Status: DC
  Administered 2023-11-07 – 2023-11-11 (×5): 100 mg via ORAL
  Filled 2023-11-07 (×5): qty 1

## 2023-11-07 SURGICAL SUPPLY — 20 items
BALLOON EMERGE MR 2.0X12 (BALLOONS) IMPLANT
BALLOON EMERGE MR 2.5X15 (BALLOONS) IMPLANT
BALLOON ~~LOC~~ EMERGE MR 3.25X15 (BALLOONS) IMPLANT
CATH GUIDELINER COAST (CATHETERS) IMPLANT
CATH INFINITI 5FR AL1 (CATHETERS) IMPLANT
CATH INFINITI 5FR MULTPACK ANG (CATHETERS) IMPLANT
CATH LAUNCHER 6FR JR4 (CATHETERS) IMPLANT
CATH VISTA GUIDE 6FR AL1 MULPK (CATHETERS) IMPLANT
GLIDESHEATH SLEND SS 6F .021 (SHEATH) IMPLANT
GUIDEWIRE INQWIRE 1.5J.035X260 (WIRE) IMPLANT
KIT ENCORE 26 ADVANTAGE (KITS) IMPLANT
KIT MICROPUNCTURE NIT STIFF (SHEATH) IMPLANT
PACK CARDIAC CATHETERIZATION (CUSTOM PROCEDURE TRAY) ×1 IMPLANT
SET ATX-X65L (MISCELLANEOUS) IMPLANT
SHEATH INTRO PINNACLE 6F 25CM (SHEATH) IMPLANT
SHIELD CATH-GARD CONTAMINATION (MISCELLANEOUS) IMPLANT
STENT SYNERGY XD 3.0X20 (Permanent Stent) IMPLANT
WIRE PACING TEMP ST TIP 5 (CATHETERS) IMPLANT
WIRE RUNTHROUGH .014X180CM (WIRE) IMPLANT
WIRE RUNTHROUGH IZANAI 014 180 (WIRE) IMPLANT

## 2023-11-07 NOTE — ED Provider Notes (Signed)
 [facility] CARDIAC CATH LAB Provider Note  CSN: [id number] Arrival date & time: 11/07/23 9051  Chief Complaint(s) Code STEMI  HPI Randall Frazier is a 68 y.o. male with past medical history as below, significant for liver transplant, CABG, htn, gout who presents to the ED with complaint of stemi  Patient arrives by EMS, began having chest pain around 9 AM this morning, crushing chest pain, difficulty breathing.  Pain radiating down both his arms.  Pain described as severe.  Was not ripping or tearing in nature.  He was initially bradycardic with EMS but has since improved.  He was given aspirin  324 by EMS.  EMS EKG concerning for inferior MI and code STEMI was activated.  Patient seen on arrival, still having ongoing chest pain which is mildly improved.  Blood pressure stable.  He is requiring supplemental oxygen.  Ongoing crushing, pressure type chest pain.  No nausea.  No syncope.   Past Medical History Past Medical History:  Diagnosis Date   Arthritis    shoulder- both, neck, spine, GOUT   Diabetes mellitus    Gout    H/O exercise stress test    many yrs. 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 weight loss -over 18 mon. period    Patient Active Problem List   Diagnosis Date Noted   Failure to thrive in adult 10/03/2023   Benign essential hypertension 10/03/2023   Cirrhosis of liver (HCC) 10/03/2023   Dental root caries 10/03/2023   Depression 10/03/2023   Encounter for dental examination and cleaning without abnormal findings 10/03/2023   Family history of colonic polyps 10/03/2023   Gastroenteritis 10/03/2023   Hallux valgus with bunions 10/03/2023   Hypertension 10/03/2023   Idiopathic aseptic necrosis of bone (HCC) 10/03/2023   Trauma and stressor-related disorder 10/03/2023   Pain in joint of right shoulder 10/03/2023   Stress and adjustment reaction 10/03/2023    Acute infection of sinus 10/03/2023   Acute sinusitis, unspecified 10/03/2023   Other adrenocortical insufficiency (HCC) 10/03/2023   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   Unspecified adrenocortical insufficiency (HCC) 10/03/2023   Anemia, unspecified 10/03/2023   Arthropathy 10/03/2023   Awaiting organ transplant status 10/03/2023   Chronic renal insufficiency 10/03/2023   Chronic kidney disease, unspecified 10/03/2023   Coagulopathy (HCC) 10/03/2023   Disease of sebaceous glands 10/03/2023   Myositis 10/03/2023   Dysphagia, unspecified 10/03/2023   Edema, unspecified 10/03/2023   Presence of other specified devices 10/03/2023   Exposure to potentially hazardous substance 10/03/2023   End stage renal disease (HCC) 10/03/2023   Gout 10/03/2023   Hepatosplenomegaly 10/03/2023   Hearing loss 10/03/2023   Other male erectile dysfunction 10/03/2023   Aspergillosis, unspecified (HCC) 10/03/2023   Other forms of aspergillosis (HCC) 10/03/2023   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   Leukopenia 10/03/2023   Major depressive disorder, single episode, moderate (HCC) 10/03/2023   Low back pain 10/03/2023   Major depressive disorder, recurrent, moderate (HCC) 10/03/2023   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   Muscle weakness (generalized) 10/03/2023   Necrotizing fasciitis (HCC) 10/03/2023   Fatty liver 10/03/2023   Need for assistance with personal care 10/03/2023   Other atopic dermatitis 10/03/2023   Morbid obesity (HCC) 10/03/2023   Other specified postprocedural states 10/03/2023   Pain in left foot 10/03/2023   Other speech disturbances 10/03/2023   Peripheral  neuropathy 10/03/2023   Encounter for other preprocedural examination 10/03/2023   Encounter for screening examination for mental health and behavioral disorders, unspecified 10/03/2023   Encounter for screening for cardiovascular disorders 10/03/2023   Other specified counseling 10/03/2023   Person encountering  health services to consult on behalf of another person 10/03/2023   Counseling, unspecified 10/03/2023   Problem related to unspecified psychosocial circumstances 10/03/2023   PTSD (post-traumatic stress disorder) 10/03/2023   Spinal stenosis in cervical region 10/03/2023   Transportation insecurity 10/03/2023   Unilateral primary osteoarthritis, left knee 10/03/2023   Unspecified abnormalities of gait and mobility 10/03/2023   Unspecified blepharitis unspecified eye, unspecified eyelid 10/03/2023   Vitamin D deficiency 10/03/2023   Type 2 diabetes mellitus without complications (HCC) 10/03/2023   Myositis 10/03/2023   Nausea with vomiting, unspecified 10/03/2023   Hyperlipidemia 10/03/2023   Mixed hyperlipidemia 10/03/2023   Esophageal varices (HCC) 10/03/2023   Seborrheic dermatitis, unspecified 10/03/2023   Arteriosclerosis of coronary artery 10/03/2023   Decompensation of cirrhosis of liver (HCC) 10/03/2023   Depressive disorder 10/03/2023   S/P flap graft 02/09/2023   Adrenal insufficiency (HCC) 03/31/2022   Heart valve disease 03/31/2022   Chronic kidney disease (CKD), stage IV (severe) (HCC) 09/19/2021   Invasive fungal sinusitis 09/19/2021   Iron  deficiency anemia 07/12/2021   At risk for opportunistic infections 05/24/2021   Immunosuppression (HCC) 05/24/2021   Mouth sore 05/20/2021   Poor dentition 05/20/2021   Hypoxia 05/02/2021   Delirium 04/16/2021   Diabetes mellitus (HCC) 04/03/2021   Anemia 03/30/2021   Liver transplant recipient [facility]) 03/27/2021   Presence of transplanted liver (HCC) 03/27/2021   Liver transplant status (HCC) 03/26/2021   Hematemesis with nausea    GIB (gastrointestinal bleeding) 02/16/2021   Other cirrhosis of liver (HCC)    GI bleed 02/15/2021   Pre-transplant evaluation for liver transplant 05/09/2020   Hepatic encephalopathy (HCC) 02/19/2020   NAFLD (nonalcoholic fatty liver disease) 90/69/7978   Cirrhosis, non-alcoholic (HCC)  02/09/2020   Portal hypertension with esophageal varices (HCC) 12/16/2019   Duodenal ulcer 12/16/2019   Gastric ulcer 12/16/2019   Gastritis 12/16/2019   Type 2 diabetes mellitus (HCC) 12/15/2019   Decompensated hepatic cirrhosis (HCC) 12/15/2019   Hyperlipemia 12/15/2019   Dyspnea 12/15/2019   Thrombocytopenia (HCC) 12/15/2019   AKI (acute kidney injury) (HCC) 12/15/2019   Hypoalbuminemia 12/15/2019   Acute hepatic encephalopathy (HCC) 12/15/2019   Hyponatremia 12/15/2019   Cellulitis of right leg 12/15/2019   Melena 12/14/2019   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   Arthritis of shoulder region 07/02/2015   Chest pain syndrome 04/24/2015   Atherosclerotic heart disease of native coronary artery without angina pectoris 03/06/2015   Essential hypertension 03/06/2015   DJD of right shoulder 03/06/2015   Non morbid obesity due to excess calories 03/06/2015   Type 2 diabetes mellitus with diabetic polyneuropathy, with long-term current use of insulin  (HCC) 03/06/2015   DOE (dyspnea on exertion) 03/06/2015   Cervical spondylosis without myelopathy 12/20/2012    Class: Diagnosis of   Biliary dyskinesia 10/05/2012   Home Medication(s) Prior to Admission medications   Medication Sig Start Date End Date Taking? Authorizing Provider  aspirin  EC 81 MG tablet Take 81 mg by mouth daily. Swallow whole.    [provider]  augmented betamethasone dipropionate (DIPROLENE-AF) 0.05 % cream Apply 1 application topically 2 (two) times daily. 02/01/20   [provider]  Cholecalciferol (VITAMIN D3) 25 MCG (1000 UT) CAPS Take 1,000 Units  by mouth daily.    [provider]  diphenhydrAMINE  (BENADRYL ) 25 mg capsule Take 25 mg by mouth every 6 (six) hours as needed for itching.     [provider]  furosemide  (LASIX ) 20 MG tablet Take 20 mg by mouth daily. 12/12/20   [provider]  lactulose  (CHRONULAC ) 10 GM/15ML solution  Take 45 mLs (30 g total) by mouth 3 (three) times daily. Hold for more than 3 large BM's a day. 02/22/20   Swayze, Ava, DO  LUBRICATING PLUS EYE DROPS 0.5 % SOLN Place 1 drop into both eyes 3 (three) times daily as needed (for irritation).  10/05/19   [provider]  metFORMIN  (GLUCOPHAGE ) 500 MG tablet Take 500 mg by mouth 2 (two) times daily with a meal.    [provider]  metoprolol  tartrate (LOPRESSOR ) 50 MG tablet Take 50 mg by mouth 2 (two) times daily. 09/04/20   [provider]  montelukast  (SINGULAIR ) 10 MG tablet Take 10 mg by mouth at bedtime.    [provider]  pantoprazole  (PROTONIX ) 40 MG tablet Take 40 mg by mouth daily as needed (heartburn, indigestion). 12/19/19   [provider]  spironolactone  (ALDACTONE ) 25 MG tablet Take 25 mg by mouth daily.     [provider]  THERAPEUTIC SHAMPOO 0.5 % shampoo Apply 1 application topically See admin instructions. Shampoo at least 4 times a week 10/05/19   [provider]  XIFAXAN  550 MG TABS tablet Take 550 mg by mouth 2 (two) times daily. 12/12/20   [provider]                                                                                                                                    Past Surgical History Past Surgical History:  Procedure Laterality Date   ANTERIOR CERVICAL DECOMP/DISCECTOMY FUSION N/A 12/20/2012   Procedure: C5-6, C6-7 ANTERIOR CERVICAL DISCECTOMY AND FUSION, ALLOGRAFT, PLATE;  Surgeon: Oneil JAYSON Herald, MD;  Location: [facility] OR;  Service: Orthopedics;  Laterality: N/A;   ESOPHAGOGASTRODUODENOSCOPY (EGD) WITH PROPOFOL  Left 12/16/2019   Procedure: ESOPHAGOGASTRODUODENOSCOPY (EGD) WITH PROPOFOL ;  Surgeon: Dianna Specking, MD;  Location: [facility] ENDOSCOPY;  Service: Endoscopy;  Laterality: Left;   ESOPHAGOGASTRODUODENOSCOPY (EGD) WITH PROPOFOL  N/A 02/16/2021   Procedure: ESOPHAGOGASTRODUODENOSCOPY (EGD) WITH PROPOFOL ;  Surgeon: Rollin Dover, MD;  Location: [facility]  ENDOSCOPY;  Service: Endoscopy;  Laterality: N/A;   HOT HEMOSTASIS N/A 02/16/2021   Procedure: HOT HEMOSTASIS (ARGON PLASMA COAGULATION/BICAP);  Surgeon: Rollin Dover, MD;  Location: [facility] ENDOSCOPY;  Service: Endoscopy;  Laterality: N/A;   KNEE SURGERY     osgood slaughter syndrome , 18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    Family History Family History  Problem Relation Age of Onset   Liver disease Sister    Diabetes Neg Hx     Social History Social History   Tobacco Use   Smoking status: Never   Smokeless tobacco: Never  Substance Use  Topics   Alcohol  use: Yes    Comment: 2 beers / year- very rare   Drug use: No   Allergies Benazepril, Bromfenac, Gabapentin, Ketorolac , Ketorolac  tromethamine , Meloxicam, Nsaids, Pork allergy, Pork-derived products, Pravastatin, Simvastatin, Statins, Trazodone  and nefazodone, Lisinopril, Niacin, and Niacin and related  Review of Systems A thorough review of systems was obtained and all systems are negative except as noted in the HPI and PMH.   Physical Exam Vital Signs  I have reviewed the triage vital signs BP (!) 147/85   Pulse 69   Resp (!) 22   Ht 6' (1.829 m)   Wt 98 kg   SpO2 98%   BMI 29.29 kg/m  Physical Exam Vitals and nursing note reviewed.  Constitutional:      Appearance: He is well-developed. He is ill-appearing. He is not diaphoretic.  HENT:     Head: Normocephalic and atraumatic.     Right Ear: External ear normal.     Left Ear: External ear normal.     Mouth/Throat:     Mouth: Mucous membranes are moist.   Eyes:     General: No scleral icterus.   Cardiovascular:     Rate and Rhythm: Normal rate and regular rhythm.     Pulses: Normal pulses.     Heart sounds: Normal heart sounds.  Pulmonary:     Effort: Pulmonary effort is normal. No respiratory distress.     Breath sounds: Normal breath sounds.  Abdominal:     General: Abdomen is flat.     Palpations: Abdomen is soft.     Tenderness: There is no abdominal tenderness.      Comments: Well-healed surgical scars   Musculoskeletal:     Cervical back: No rigidity.     Right lower leg: No edema.     Left lower leg: No edema.   Skin:    General: Skin is warm and dry.     Capillary Refill: Capillary refill takes less than 2 seconds.     Coloration: Skin is pale.   Neurological:     Mental Status: He is alert.   Psychiatric:        Mood and Affect: Mood normal.        Behavior: Behavior normal.     ED Results and Treatments Labs (all labs ordered are listed, but only abnormal results are displayed) Labs Reviewed  CBC WITH DIFFERENTIAL/PLATELET - Abnormal; Notable for the following components:      Result Value   WBC 2.7 (*)    RBC 4.01 (*)    Hemoglobin 11.4 (*)    HCT 34.0 (*)    Platelets 110 (*)    Neutro Abs 1.6 (*)    All other components within normal limits  I-STAT CHEM 8, ED - Abnormal; Notable for the following components:   BUN 26 (*)    Creatinine, Ser 2.50 (*)    Glucose, Bld 215 (*)    Calcium , Ion 1.07 (*)    TCO2 20 (*)    Hemoglobin 10.9 (*)    HCT 32.0 (*)    All other components within normal limits  PROTIME-INR  APTT  COMPREHENSIVE METABOLIC PANEL WITH GFR  LIPID PANEL  I-STAT CG4 LACTIC ACID, ED  TROPONIN I (HIGH SENSITIVITY)  Radiology No results found.  Pertinent labs & imaging results that were available during my care of the patient were reviewed by me and considered in my medical decision making (see MDM for details).  Medications Ordered in ED Medications  nitroGLYCERIN 100 mcg/mL intra-arterial injection (has no administration in time range)  Heparin (Porcine) in NaCl 1000-0.9 UT/500ML-% SOLN (500 mLs  Given 11/07/23 1022)  Radial Cocktail/Verapamil only (10 mLs Intra-arterial Given 11/07/23 1025)  midazolam  (VERSED ) injection (1 mg Intravenous Given 11/07/23 1025)  fentaNYL  (SUBLIMAZE )  injection (25 mcg Intravenous Given 11/07/23 1025)  heparin sodium (porcine) injection (6,000 Units Intravenous Given 11/07/23 1030)  norepinephrine (LEVOPHED) 4 mg in dextrose  5 % 250 mL (0.016 mg/mL) infusion (4.8 mcg/min Intravenous New Bag/Given 11/07/23 1034)  heparin injection 4,000 Units ( Intravenous MAR Hold 11/07/23 1010)  morphine  (PF) 4 MG/ML injection 4 mg ( Intravenous MAR Hold 11/07/23 1010)  ondansetron  (ZOFRAN ) injection 4 mg ( Intravenous MAR Hold 11/07/23 1010)                                                                                                                                     Procedures .Critical Care  Performed by: Elnor Jayson LABOR, DO Authorized by: Elnor Jayson LABOR, DO   Critical care provider statement:    Critical care time (minutes):  30   Critical care time was exclusive of:  Separately billable procedures and treating other patients   Critical care was necessary to treat or prevent imminent or life-threatening deterioration of the following conditions:  Cardiac failure   Critical care was time spent personally by me on the following activities:  Development of treatment plan with patient or surrogate, discussions with consultants, evaluation of patient's response to treatment, examination of patient, ordering and review of laboratory studies, ordering and review of radiographic studies, ordering and performing treatments and interventions, pulse oximetry, re-evaluation of patient's condition, review of old charts and obtaining history from patient or surrogate   Care discussed with: admitting provider   Ultrasound ED Echo  Date/Time: 11/07/2023 10:45 AM  Performed by: Elnor Jayson LABOR, DO Authorized by: Elnor Jayson LABOR, DO   Procedure details:    Indications: chest pain     Views: parasternal long axis view and apical 4 chamber view     Images: archived     Limitations:  Body habitus and positioning Findings:    Pericardium: no pericardial effusion     LV  Function: normal (>50% EF)   Impression:    Impression: normal     (including critical care time)  Medical Decision Making / ED Course    Medical Decision Making:    Randall Frazier is a 68 y.o. male with past medical history as below, significant for liver transplant, CABG, htn, gout who presents to the ED with complaint of stemi. The complaint involves an extensive differential diagnosis and also carries with it a high  risk of complications and morbidity.  Serious etiology was considered. Ddx includes but is not limited to: Differential includes all life-threatening causes for chest pain. This includes but is not exclusive to acute coronary syndrome, aortic dissection, pulmonary embolism, cardiac tamponade, community-acquired pneumonia, pericarditis, musculoskeletal chest wall pain, etc.   Complete initial physical exam performed, notably the patient was in mild distress, he is pale, ongoing crushing chest pain.    Reviewed and confirmed nursing documentation for past medical history, family history, social history.  Vital signs reviewed.     Brief summary:  68 year old male history as above here with chest pain, found to have inferior MI. Given aspirin  by EMS. Will start heparin here. Send screening labs Bedside POCUS without tamponade or large pericardial effusion, no gross wall motion abnormalities   Clinical Course as of 11/07/23 1046  Sat Nov 07, 2023  1002 Spoke w/ Dr Wonda, en route  [SG]    Clinical Course User Index [SG] Elnor Jayson LABOR, DO     Admit to cardiology.             Additional history obtained: -Additional history obtained from ems -External records from outside source obtained and reviewed including: Chart review including previous notes, labs, imaging, consultation notes including  His care is primarily through the [facility] and in [place]   Lab Tests: -I ordered, reviewed, and interpreted labs.   The pertinent results include:   Labs  Reviewed  CBC WITH DIFFERENTIAL/PLATELET - Abnormal; Notable for the following components:      Result Value   WBC 2.7 (*)    RBC 4.01 (*)    Hemoglobin 11.4 (*)    HCT 34.0 (*)    Platelets 110 (*)    Neutro Abs 1.6 (*)    All other components within normal limits  I-STAT CHEM 8, ED - Abnormal; Notable for the following components:   BUN 26 (*)    Creatinine, Ser 2.50 (*)    Glucose, Bld 215 (*)    Calcium , Ion 1.07 (*)    TCO2 20 (*)    Hemoglobin 10.9 (*)    HCT 32.0 (*)    All other components within normal limits  PROTIME-INR  APTT  COMPREHENSIVE METABOLIC PANEL WITH GFR  LIPID PANEL  I-STAT CG4 LACTIC ACID, ED  TROPONIN I (HIGH SENSITIVITY)    Notable for labs pending  EKG   EKG Interpretation Date/Time:  Saturday November 07 2023 09:54:24 EDT Ventricular Rate:  63 PR Interval:  240 QRS Duration:  118 QT Interval:  439 QTC Calculation: 450 R Axis:   48  Text Interpretation: Sinus rhythm Prolonged PR interval Incomplete left bundle branch block Inferior infarct, acute (RCA) Minimal ST elevation, anterior leads Probable RV involvement, suggest recording right precordial leads >>> Acute MI <<< ** ** ACUTE MI / STEMI ** ** Confirmed by Elnor Jayson ([id number]) on 11/07/2023 10:17:17 AM         Imaging Studies ordered: na   Medicines ordered and prescription drug management: Meds ordered this encounter  Medications   heparin injection 4,000 Units   DISCONTD: heparin injection 60 Units/kg   morphine  (PF) 4 MG/ML injection 4 mg   ondansetron  (ZOFRAN ) injection 4 mg   nitroGLYCERIN 100 mcg/mL intra-arterial injection    Roa, Paulette R: cabinet override   Heparin (Porcine) in NaCl 1000-0.9 UT/500ML-% SOLN   Radial Cocktail/Verapamil only   midazolam  (VERSED ) injection   fentaNYL  (SUBLIMAZE ) injection   heparin sodium (porcine) injection   norepinephrine (LEVOPHED)  4 mg in dextrose  5 % 250 mL (0.016 mg/mL) infusion    -I have reviewed the patients home medicines and  have made adjustments as needed   Consultations Obtained: I requested consultation with the dr cooper,  and discussed lab and imaging findings as well as pertinent plan - they recommend: cath lab   Cardiac Monitoring: The patient was maintained on a cardiac monitor.  I personally viewed and interpreted the cardiac monitored which showed an underlying rhythm of: nsr, stemi Continuous pulse oximetry interpreted by myself, 98% on 3LNC.    Social Determinants of Health:  Diagnosis or treatment significantly limited by social determinants of health: na   Reevaluation: After the interventions noted above, I reevaluated the patient and found that they have improved  Co morbidities that complicate the patient evaluation  Past Medical History:  Diagnosis Date   Arthritis    shoulder- both, neck, spine, GOUT   Diabetes mellitus    Gout    H/O exercise stress test    many yrs. 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 weight loss -over 18 mon. period       Dispostion: Disposition decision including need for hospitalization was considered, and patient admitted to the hospital.    Final Clinical Impression(s) / ED Diagnoses Final diagnoses:  ST elevation myocardial infarction (STEMI), unspecified artery (HCC)        Elnor Jayson LABOR, DO 11/07/23 1046

## 2023-11-07 NOTE — ED Notes (Signed)
 Pt transported to cath lab with this RN and Sophia NT connected to [facility] and monitor with oxygen in place.

## 2023-11-07 NOTE — ED Triage Notes (Signed)
 Pt presents to ED via EMS from home with complaints of chest pain that began at 0900. Code STEMI paged out by EMS showing inferiorly. Heart rate originally in the 30s increased to 60s with no intervention. Rates pain 9/10 and states it feels like someone is standing on his chest. Radiates to both arm. Pale, diaphoretic, and short of breath.  324 aspirin  given by EMS 22G LFA  163/85 92% RA, arrives on 4L [place] at 98%

## 2023-11-07 NOTE — Discharge Instructions (Signed)

## 2023-11-07 NOTE — H&P (Signed)
 Cardiology Admission History and Physical   Patient ID: Randall Frazier MRN: [id number]; DOB: [birth year]   Admission date: 11/07/2023  PCP:  [facility]   [facility] Providers Cardiologist:  None       Chief Complaint: Chest pain  Patient Profile: Randall Frazier is a 68 y.o. male with the complicated medical history who is being seen 11/07/2023 for the evaluation of inferior STEMI.  History of Present Illness: Randall Frazier has a history of coronary artery disease status post CABG about 8 years ago.  He has a complicated medical history that includes liver transplant, autoimmune disease, and chronic fungal sinusitis.  He was in his normal state of health this morning when he experienced the acute onset of severe substernal chest discomfort and diaphoresis.  This was also associated with weakness and shortness of breath.  EMS was called and an EKG in the field demonstrated an acute inferior STEMI with marked bradycardia, ventricular rate in the 30s.  A code STEMI is called and the patient presents directly to the cardiac catheterization lab.  On my interview here, he has a heart rate of about 30 to 33 bpm, appears pale, and is continue to experience chest discomfort.  States he did not have any warning symptoms before this morning.   Past Medical History:  Diagnosis Date   Arthritis    shoulder- both, neck, spine, GOUT   Diabetes mellitus    Gout    H/O exercise stress test    many yrs. 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 weight loss -over 18 mon. period    Past Surgical History:  Procedure Laterality Date   ANTERIOR CERVICAL DECOMP/DISCECTOMY FUSION N/A 12/20/2012   Procedure: C5-6, C6-7 ANTERIOR CERVICAL DISCECTOMY AND FUSION, ALLOGRAFT, PLATE;  Surgeon: Oneil JAYSON Herald, MD;  Location: [facility] OR;  Service: Orthopedics;  Laterality: N/A;   ESOPHAGOGASTRODUODENOSCOPY (EGD) WITH PROPOFOL   Left 12/16/2019   Procedure: ESOPHAGOGASTRODUODENOSCOPY (EGD) WITH PROPOFOL ;  Surgeon: Dianna Specking, MD;  Location: [facility] ENDOSCOPY;  Service: Endoscopy;  Laterality: Left;   ESOPHAGOGASTRODUODENOSCOPY (EGD) WITH PROPOFOL  N/A 02/16/2021   Procedure: ESOPHAGOGASTRODUODENOSCOPY (EGD) WITH PROPOFOL ;  Surgeon: Rollin Dover, MD;  Location: [facility] ENDOSCOPY;  Service: Endoscopy;  Laterality: N/A;   HOT HEMOSTASIS N/A 02/16/2021   Procedure: HOT HEMOSTASIS (ARGON PLASMA COAGULATION/BICAP);  Surgeon: Rollin Dover, MD;  Location: [facility] ENDOSCOPY;  Service: Endoscopy;  Laterality: N/A;   KNEE SURGERY     osgood slaughter syndrome , 18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 Authorizing Provider  aspirin  81 MG chewable tablet Chew 81 mg by mouth daily.   Yes [provider]  doxycycline (ADOXA) 100 MG tablet Take 100 mg by mouth at bedtime.   Yes [provider]  hydrocortisone (CORTEF) 10 MG tablet Take 5-10 mg by mouth See admin instructions. Take 1 tablet (10mg ) by mouth in the morning and take 1/2 tablet (5mg ) at bedtime.   Yes [provider]  Isavuconazonium Sulfate (CRESEMBA) 186 MG CAPS Take 372 mg by mouth daily.   Yes [provider]  mycophenolate (CELLCEPT) 250 MG capsule Take 250 mg by mouth 2 (two) times daily.   Yes [provider]  omeprazole (PRILOSEC) 40 MG capsule Take 40 mg by mouth daily.  Yes [provider]  sertraline (ZOLOFT) 100 MG tablet Take 100 mg by mouth daily.   Yes [provider]  sirolimus (RAPAMUNE) 1 MG tablet Take 1 mg by mouth daily.   Yes [provider]     Allergies:    Allergies  Allergen Reactions   Bromsite [Bromfenac Sodium] Other (See Comments)    Kidney disorder; serum creatinine raised.   Desyrel  [Trazodone ] Nausea Only   Lotensin [Benazepril] Cough   Mobic [Meloxicam] Other (See Comments)    Hx liver  transplant   Neurontin [Gabapentin] Swelling   Nsaids Other (See Comments)    Kidney Disorder; serum creatinine above reference range   Pork Allergy Other (See Comments)    Gout flares   Pravachol [Pravastatin] Other (See Comments)    Liver enzymes abnormal; outside reference range Myalgias   Statins Other (See Comments)    Liver enzymes abnormal; outside reference range  Myalgias Tried on Pravachol and Zocor   Toradol  [Ketorolac  Tromethamine ] Other (See Comments)    Kidney disorder; serum creatinine above reference range   Zocor [Simvastatin] Other (See Comments)    Liver enzymes abnormal; outside reference range Myalgias   Niaspan [Niacin] Other (See Comments)    Vasomotor symptoms   Zestril [Lisinopril] Swelling, Dermatitis, Rash and Other (See Comments)    Social History:   Social History   Socioeconomic History   Marital status: Married    Spouse name: Not on file   Number of children: Not on file   Years of education: Not on file   Highest education level: Not on file  Occupational History   Not on file  Tobacco Use   Smoking status: Never   Smokeless tobacco: Never  Substance and Sexual Activity   Alcohol  use: Yes    Comment: 2 beers / year- very rare   Drug use: No   Sexual activity: Not on file  Other Topics Concern   Not on file  Social History Narrative   Not on file   Social Drivers of Health   Financial Resource Strain: Low Risk  (05/20/2022)   Received from [facility]   Overall Financial Resource Strain (CARDIA)    Difficulty of Paying Living Expenses: Not very hard  Food Insecurity: No Food Insecurity (05/20/2022)   Received from [facility]   Hunger Vital Sign    Within the past 12 months, you worried that your food would run out before you got the money to buy more.: Never true    Within the past 12 months, the food you bought just didn't last and you didn't have money to get more.: Never true  Transportation  Needs: No Transportation Needs (05/20/2022)   Received from [facility] - Transportation    In the past 12 months, has lack of transportation kept you from medical appointments or from getting medications?: No    Lack of Transportation (Non-Medical): No  Physical Activity: Not on file  Stress: No Stress Concern Present (02/10/2020)   Received from [facility] - Occupational Stress Questionnaire    Feeling of Stress : Not at all  Social Connections: Unknown (09/23/2021)   Received from [facility]   Social Network    Social Network: Not on file  Intimate Partner Violence: Unknown (08/15/2021)   Received from [facility]   HITS    Physically Hurt: Not on file    Insult or Talk Down  To: Not on file    Threaten Physical Harm: Not on file    Scream or Curse: Not on file     Family History:   The patient's family history includes Liver disease in his sister. There is no history of Diabetes.    ROS:  Please see the history of present illness.  All other ROS reviewed and negative.     Physical Exam/Data: Vitals:   11/07/23 1230 11/07/23 1232 11/07/23 1235 11/07/23 1240  BP:  (!) 140/75    Pulse: 65 64 65 71  Resp: 18 14 14 17   SpO2: 98% 96% 96% 98%  Weight:      Height:        Intake/Output Summary (Last 24 hours) at 11/07/2023 1312 Last data filed at 11/07/2023 1100 Gross per 24 hour  Intake 6.89 ml  Output --  Net 6.89 ml      11/07/2023   10:00 AM 07/12/2021   11:11 AM 03/23/2021    1:06 PM  Last 3 Weights  Weight (lbs) 216 lb 215 lb 7 oz 281 lb  Weight (kg) 97.977 kg 97.722 kg 127.461 kg     Body mass index is 29.29 kg/m.  General: Age-appropriate, mild distress secondary to chest discomfort HEENT: normal Neck: no JVD Vascular: No carotid bruits; Distal pulses 2+ bilaterally   Cardiac:  normal S1, S2; RRR; no murmur  Lungs:  clear to auscultation bilaterally, no wheezing, rhonchi or rales   Abd: soft, nontender, no hepatomegaly  Ext: Trace bilateral pretibial and ankle edema Musculoskeletal:  No deformities, BUE and BLE strength normal and equal Skin: warm and dry, pale appearing Neuro:  CNs 2-12 intact, no focal abnormalities noted Psych:  Normal affect   EKG:  The ECG that was done in the field was personally reviewed and demonstrates sinus bradycardia, acute inferoposterior/lateral STEMI  Relevant CV Studies: Pending  Laboratory Data: High Sensitivity Troponin:   Recent Labs  Lab 11/07/23 0958  TROPONINIHS 8      Chemistry Recent Labs  Lab 11/07/23 0958 11/07/23 1006  NA 137 137  K 3.9 3.8  CL 104 106  CO2 19*  --   GLUCOSE 218* 215*  BUN 24* 26*  CREATININE 2.35* 2.50*  CALCIUM  9.1  --   GFRNONAA 29*  --   ANIONGAP 14  --     Recent Labs  Lab 11/07/23 0958  PROT 6.3*  ALBUMIN  3.7  AST 18  ALT 20  ALKPHOS 122  BILITOT 0.3   Lipids  Recent Labs  Lab 11/07/23 0958  CHOL 284*  TRIG 708*  HDL 28*  LDLCALC UNABLE TO CALCULATE IF TRIGLYCERIDE OVER 400 mg/dL  CHOLHDL 89.8   Hematology Recent Labs  Lab 11/07/23 0958 11/07/23 1006  WBC 2.7*  --   RBC 4.01*  --   HGB 11.4* 10.9*  HCT 34.0* 32.0*  MCV 84.8  --   MCH 28.4  --   MCHC 33.5  --   RDW 13.3  --   PLT 110*  --    Thyroid No results for input(s): TSH, FREET4 in the last 168 hours. BNPNo results for input(s): BNP, PROBNP in the last 168 hours.  DDimer No results for input(s): DDIMER in the last 168 hours.  Radiology/Studies:  CARDIAC CATHETERIZATION Result Date: 11/07/2023 1.  Acute STEMI involving the RCA complicated by complete heart block and hypotension.  Distal RCA 100% thrombotic occlusion at baseline, treated with primary PCI necessitating a guide extension catheter, with 0% residual  stenosis and TIMI-3 flow after stenting with a 3.0 x 20 mm Synergy DES 2.  Severe distal left main stenosis 3.  Total occlusion of the proximal LAD with patent LIMA to LAD graft  4.  Severe proximal and mid circumflex stenoses with a patent saphenous vein graft to ramus intermedius/OM1 5.  Complete heart block complicating acute MI, necessitating temporary venous pacing but resolving after reperfusion 6.  Normal LVEDP 12 mmHg, serial lactate 1.8--->1.5 Recommendations: Gentle fluid hydration to minimize risk of AKI on background of chronic kidney disease (creatinine 2.5 today).  DAPT with aspirin  and ticagrelor 12 months without interruption.  Check 2D echo for assessment of LV function.  Favor medical therapy for residual CAD in the context of the patient's comorbid medical problems.     Assessment and Plan: STEMI involving the RCA: Patient received heparin 4000 units IV on arrival, emergency catheterization/PCI performed.  Complex intervention of the RCA done.  Patient will be treated with aspirin  and ticagrelor, high intensity statin drug, and post MI medical therapy as tolerated.  His medications will be checked for interactions with his immunosuppressant drugs. Complete heart block with hemodynamic instability/hypotension: Temporary pacing performed with stabilization of the patient.  Norepinephrine used as well.  Post PCI, the patient is back in sinus rhythm and hemodynamically stable.  The temporary pacemaker wire was removed and he has weaned off of norepinephrine infusion. Chronic kidney disease stage IIIb with creatinine 2.5.  Contrast limited to 70 cc for procedure.  Will give gentle IV fluids and repeat kidney function testing in the morning.  Obviously avoid nephrotoxic drugs, as well as ACE/ARB. Lipids: Patient with hypercholesterolemia, total cholesterol 284, triglycerides 708, unable to calculate LDL.  Patient statin intolerant.  Will need to explore this with him in determine what lipid-lowering medication he can tolerate. History of liver transplant/immunosuppressed state: Continue home medicines which have been reconciled.  Risk Assessment/Risk Scores:   TIMI  Risk Score for ST  Elevation MI:   The patient's TIMI risk score is 6, which indicates a 16.1% risk of all cause mortality at 30 days.      Code Status: Full Code  Severity of Illness: The appropriate patient status for this patient is INPATIENT. Inpatient status is judged to be reasonable and necessary in order to provide the required intensity of service to ensure the patient's safety. The patient's presenting symptoms, physical exam findings, and initial radiographic and laboratory data in the context of their chronic comorbidities is felt to place them at high risk for further clinical deterioration. Furthermore, it is not anticipated that the patient will be medically stable for discharge from the hospital within 2 midnights of admission.   * I certify that at the point of admission it is my clinical judgment that the patient will require inpatient hospital care spanning beyond 2 midnights from the point of admission due to high intensity of service, high risk for further deterioration and high frequency of surveillance required.*  For questions or updates, please contact [facility] Please consult [email or web address] for contact info under     Signed, Ozell Fell, MD  11/07/2023 1:12 PM

## 2023-11-07 NOTE — Progress Notes (Addendum)
   11/07/23 1012  Spiritual Encounters  Type of Visit Initial  Reason for visit Code  OnCall Visit Yes   Chaplain responded to on call page for Code STEMI. Medical staff worked on patient and then took him to [employer]. No spiritual need at this time.  Chaplain Therisa Samuel

## 2023-11-08 ENCOUNTER — Encounter (HOSPITAL_COMMUNITY): Payer: Self-pay | Admitting: Cardiovascular Disease

## 2023-11-08 ENCOUNTER — Inpatient Hospital Stay (HOSPITAL_COMMUNITY)

## 2023-11-08 DIAGNOSIS — I2111 ST elevation (STEMI) myocardial infarction involving right coronary artery: Secondary | ICD-10-CM

## 2023-11-08 LAB — LDL CHOLESTEROL, DIRECT: Direct LDL: 155 mg/dL — ABNORMAL HIGH (ref 0–99)

## 2023-11-08 LAB — BASIC METABOLIC PANEL WITH GFR
Anion gap: 13 (ref 5–15)
BUN: 23 mg/dL (ref 8–23)
CO2: 20 mmol/L — ABNORMAL LOW (ref 22–32)
Calcium: 8.4 mg/dL — ABNORMAL LOW (ref 8.9–10.3)
Chloride: 104 mmol/L (ref 98–111)
Creatinine, Ser: 2.45 mg/dL — ABNORMAL HIGH (ref 0.61–1.24)
GFR, Estimated: 28 mL/min — ABNORMAL LOW (ref >60)
Glucose, Bld: 182 mg/dL — ABNORMAL HIGH (ref 70–99)
Potassium: 4.7 mmol/L (ref 3.5–5.1)
Sodium: 137 mmol/L (ref 135–145)

## 2023-11-08 LAB — CBC
HCT: 30.4 % — ABNORMAL LOW (ref 39.0–52.0)
Hemoglobin: 10.1 g/dL — ABNORMAL LOW (ref 13.0–17.0)
MCH: 28.5 pg (ref 26.0–34.0)
MCHC: 33.2 g/dL (ref 30.0–36.0)
MCV: 85.9 fL (ref 80.0–100.0)
Platelets: 117 10*3/uL — ABNORMAL LOW (ref 150–400)
RBC: 3.54 MIL/uL — ABNORMAL LOW (ref 4.22–5.81)
RDW: 13.6 % (ref 11.5–15.5)
WBC: 4.3 10*3/uL (ref 4.0–10.5)
nRBC: 0 % (ref 0.0–0.2)

## 2023-11-08 LAB — ECHOCARDIOGRAM COMPLETE
Area-P 1/2: 3.17 cm2
Calc EF: 41.8 %
Height: 72 in
S' Lateral: 3.4 cm
Single Plane A2C EF: 43.5 %
Single Plane A4C EF: 41.1 %
Weight: 3456 [oz_av]

## 2023-11-08 LAB — MAGNESIUM: Magnesium: 1.7 mg/dL (ref 1.7–2.4)

## 2023-11-08 LAB — HEMOGLOBIN A1C
Hgb A1c MFr Bld: 7.9 % — ABNORMAL HIGH (ref 4.8–5.6)
Mean Plasma Glucose: 180.03 mg/dL

## 2023-11-08 LAB — LIPID PANEL
Cholesterol: 260 mg/dL — ABNORMAL HIGH (ref 0–200)
HDL: 24 mg/dL — ABNORMAL LOW (ref >40)
LDL Cholesterol: UNDETERMINED mg/dL (ref 0–99)
Total CHOL/HDL Ratio: 10.8 ratio
Triglycerides: 522 mg/dL — ABNORMAL HIGH (ref <150)
VLDL: UNDETERMINED mg/dL (ref 0–40)

## 2023-11-08 MED ORDER — POLYETHYLENE GLYCOL 3350 17 G PO PACK
17.0000 g | PACK | Freq: Every day | ORAL | Status: DC | PRN
  Administered 2023-11-08 – 2023-11-10 (×2): 17 g via ORAL
  Filled 2023-11-08: qty 1

## 2023-11-08 MED ORDER — MAGNESIUM OXIDE -MG SUPPLEMENT 400 (240 MG) MG PO TABS
800.0000 mg | ORAL_TABLET | Freq: Once | ORAL | Status: AC
  Administered 2023-11-08: 800 mg via ORAL
  Filled 2023-11-08: qty 2

## 2023-11-08 MED ORDER — ENOXAPARIN SODIUM 40 MG/0.4ML IJ SOSY
40.0000 mg | PREFILLED_SYRINGE | INTRAMUSCULAR | Status: DC
  Administered 2023-11-09 – 2023-11-11 (×3): 40 mg via SUBCUTANEOUS
  Filled 2023-11-08 (×3): qty 0.4

## 2023-11-08 MED ORDER — DOXYCYCLINE HYCLATE 100 MG PO TABS
100.0000 mg | ORAL_TABLET | Freq: Every day | ORAL | Status: DC
  Administered 2023-11-09: 100 mg via ORAL
  Filled 2023-11-08: qty 1

## 2023-11-08 MED ORDER — PERFLUTREN LIPID MICROSPHERE
1.0000 mL | INTRAVENOUS | Status: AC | PRN
  Administered 2023-11-08: 2 mL via INTRAVENOUS

## 2023-11-08 NOTE — Plan of Care (Signed)
  Problem: Education: Goal: Knowledge of General Education information will improve Description: Including pain rating scale, medication(s)/side effects and non-pharmacologic comfort measures Outcome: Progressing   Problem: Health Behavior/Discharge Planning: Goal: Ability to manage health-related needs will improve Outcome: Progressing   Problem: Clinical Measurements: Goal: Ability to maintain clinical measurements within normal limits will improve Outcome: Progressing Goal: Will remain free from infection Outcome: Progressing Goal: Diagnostic test results will improve Outcome: Progressing Goal: Respiratory complications will improve Outcome: Progressing Goal: Cardiovascular complication will be avoided Outcome: Progressing   Problem: Coping: Goal: Level of anxiety will decrease Outcome: Progressing   Problem: Elimination: Goal: Will not experience complications related to bowel motility Outcome: Progressing Goal: Will not experience complications related to urinary retention Outcome: Progressing   Problem: Pain Managment: Goal: General experience of comfort will improve and/or be controlled Outcome: Progressing   Problem: Skin Integrity: Goal: Risk for impaired skin integrity will decrease Outcome: Progressing   Problem: Education: Goal: Understanding of cardiac disease, CV risk reduction, and recovery process will improve Outcome: Progressing Goal: Individualized Educational Video(s) Outcome: Progressing   Problem: Activity: Goal: Ability to return to baseline activity level will improve Outcome: Progressing

## 2023-11-08 NOTE — Progress Notes (Signed)
  Echocardiogram 2D Echocardiogram has been performed.  Randall Frazier 11/08/2023, 11:23 AM

## 2023-11-08 NOTE — Progress Notes (Signed)
 Cardiology Progress Note   Patient ID: Randall Frazier MRN: [id number]; DOB: Feb 24, 1956   Admission date: 11/07/2023  PCP:  [facility] Providers Cardiologist:  None       Chief Complaint: Chest pain  Patient Profile: Randall Frazier is a 68 y.o. male with the complicated medical history who is being seen 11/08/2023 for the evaluation of inferior STEMI.  Interval hx:  - Telemetry with NSR today - Bedside TTE with LVEF 40%; appears to have inferoseptal/apical hypokinesis.  - No complaints; mild chest pain.    Past Medical History:  Diagnosis Date   Arthritis    shoulder- both, neck, spine, GOUT   Diabetes mellitus    Gout    H/O exercise stress test    many yrs. 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 weight loss -over 18 mon. period    Past Surgical History:  Procedure Laterality Date   ANTERIOR CERVICAL DECOMP/DISCECTOMY FUSION N/A 12/20/2012   Procedure: C5-6, C6-7 ANTERIOR CERVICAL DISCECTOMY AND FUSION, ALLOGRAFT, PLATE;  Surgeon: Oneil JAYSON Herald, MD;  Location: [facility] OR;  Service: Orthopedics;  Laterality: N/A;   BACK SURGERY     ESOPHAGOGASTRODUODENOSCOPY (EGD) WITH PROPOFOL  Left 12/16/2019   Procedure: ESOPHAGOGASTRODUODENOSCOPY (EGD) WITH PROPOFOL ;  Surgeon: Dianna Specking, MD;  Location[facility] ENDOSCOPY;  Service: Endoscopy;  Laterality: Left;   ESOPHAGOGASTRODUODENOSCOPY (EGD) WITH PROPOFOL  N/A 02/16/2021   Procedure: ESOPHAGOGASTRODUODENOSCOPY (EGD) WITH PROPOFOL ;  Surgeon: Rollin Dover, MD;  Location: [facility] ENDOSCOPY;  Service: Endoscopy;  Laterality: N/A;   HOT HEMOSTASIS N/A 02/16/2021   Procedure: HOT HEMOSTASIS (ARGON PLASMA COAGULATION/BICAP);  Surgeon: Rollin Dover, MD;  Location: [facility] ENDOSCOPY;  Service: Endoscopy;  Laterality: N/A;   KNEE SURGERY     osgood slaughter syndrome  , 18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 Authorizing Provider  aspirin  81 MG chewable tablet Chew 81 mg by mouth daily.   Yes [provider]  doxycycline (ADOXA) 100 MG tablet Take 100 mg by mouth at bedtime.   Yes [provider]  hydrocortisone (CORTEF) 10 MG tablet Take 5-10 mg by mouth See admin instructions. Take 1 tablet (10mg ) by mouth in the morning and take 1/2 tablet (5mg ) at bedtime.   Yes [provider]  Isavuconazonium Sulfate (CRESEMBA) 186 MG CAPS Take 372 mg by mouth daily.   Yes [provider]  mycophenolate (CELLCEPT) 250 MG capsule Take 250 mg by mouth 2 (two) times daily.   Yes [provider]  omeprazole (PRILOSEC) 40 MG capsule Take 40 mg by mouth daily.   Yes [provider]  sertraline (ZOLOFT) 100 MG tablet Take 100 mg by mouth daily.   Yes [provider]  sirolimus (RAPAMUNE) 1 MG tablet Take 1 mg by mouth daily.   Yes [provider]     Allergies:    Allergies  Allergen Reactions   Bromsite [Bromfenac Sodium] Other (See Comments)    Kidney disorder; serum creatinine raised.   Desyrel  [Trazodone ] Nausea Only   Lotensin [Benazepril] Cough   Mobic [Meloxicam] Other (See Comments)    Hx liver  transplant   Neurontin [Gabapentin] Swelling   Nsaids Other (See Comments)    Kidney Disorder; serum creatinine above reference range   Pork Allergy Other (See Comments)    Gout flares   Pravachol [Pravastatin] Other (See Comments)    Liver enzymes abnormal; outside reference range Myalgias   Statins Other (See Comments)    Liver enzymes abnormal; outside reference range  Myalgias Tried on Pravachol and Zocor   Toradol  [Ketorolac  Tromethamine ] Other (See Comments)    Kidney disorder; serum creatinine above reference range   Zocor [Simvastatin] Other (See Comments)    Liver enzymes  abnormal; outside reference range Myalgias   Niaspan [Niacin] Other (See Comments)    Vasomotor symptoms   Zestril [Lisinopril] Swelling, Dermatitis, Rash and Other (See Comments)    Social History:   Social History   Socioeconomic History   Marital status: Married    Spouse name: Not on file   Number of children: Not on file   Years of education: Not on file   Highest education level: Not on file  Occupational History   Not on file  Tobacco Use   Smoking status: Never   Smokeless tobacco: Never  Vaping Use   Vaping status: Never Used  Substance and Sexual Activity   Alcohol  use: Yes    Comment: 2 beers / year- very rare   Drug use: No   Sexual activity: Not on file  Other Topics Concern   Not on file  Social History Narrative   Not on file   Social Drivers of Health   Financial Resource Strain: Low Risk  (05/20/2022)   Received from [facility]   Overall Financial Resource Strain (CARDIA)    Difficulty of Paying Living Expenses: Not very hard  Food Insecurity: No Food Insecurity (11/07/2023)   Hunger Vital Sign    Worried About Running Out of Food in the Last Year: Never true    Ran Out of Food in the Last Year: Never true  Transportation Needs: No Transportation Needs (11/07/2023)   PRAPARE - Administrator, Civil Service (Medical): No    Lack of Transportation (Non-Medical): No  Physical Activity: Not on file  Stress: No Stress Concern Present (02/10/2020)   Received from [facility] - Occupational Stress Questionnaire    Feeling of Stress : Not at all  Social Connections: Unknown (11/07/2023)   Social Connection and Isolation Panel    Frequency of Communication with Friends and Family: More than three times a week    Frequency of Social Gatherings with Friends and Family: More than three times a week    Attends Religious Services: Patient declined    Database administrator or  Organizations: Patient declined    Attends Banker Meetings: Patient declined    Marital Status: Married  Catering manager Violence: Not At Risk (11/07/2023)   Humiliation, Afraid, Rape, and Kick questionnaire    Fear of Current or Ex-Partner: No    Emotionally Abused: No    Physically Abused: No    Sexually Abused: No     Family History:   The patient's family history includes Liver disease in his sister. There is no history of Diabetes.    ROS:  Please see the history of present illness.  All other ROS reviewed and negative.     Physical Exam/Data: Vitals:   11/08/23 0721 11/08/23 0800 11/08/23 0900 11/08/23 1000  BP:  94/61 ROLLEN)  97/57 (!) 95/51  Pulse: 61 62 64   Resp: 10 (!) 24 (!) 21 (!) 21  Temp: (!) 97.1 F (36.2 C)     TempSrc: Axillary     SpO2: 93% 94% 92%   Weight:      Height:        Intake/Output Summary (Last 24 hours) at 11/08/2023 1044 Last data filed at 11/08/2023 1002 Gross per 24 hour  Intake 848.63 ml  Output 800 ml  Net 48.63 ml      11/07/2023   10:00 AM 07/12/2021   11:11 AM 03/23/2021    1:06 PM  Last 3 Weights  Weight (lbs) 216 lb 215 lb 7 oz 281 lb  Weight (kg) 97.977 kg 97.722 kg 127.461 kg     Body mass index is 29.29 kg/m.  Vitals:   11/08/23 0900 11/08/23 1000  BP: (!) 97/57 (!) 95/51  Pulse: 64   Resp: (!) 21 (!) 21  Temp:    SpO2: 92%    GENERAL: NAD Lungs- CTA CARDIAC:  JVP: 6 cm          Normal rate with regular rhythm. no murmur.  Pulses 2+. no edema.  ABDOMEN: Soft, non-tender, non-distended.  EXTREMITIES: Warm and well perfused.  NEUROLOGIC: No obvious FND   EKG:  The ECG that was done in the field was personally reviewed and demonstrates sinus bradycardia, acute inferoposterior/lateral STEMI  Relevant CV Studies: TTE pending  Laboratory Data: High Sensitivity Troponin:   Recent Labs  Lab 11/07/23 0958 11/07/23 1333  TROPONINIHS 8 >24,000*      Chemistry Recent Labs  Lab 11/07/23 0958  11/07/23 1006 11/07/23 1333 11/08/23 0346  NA 137 137  --  137  K 3.9 3.8  --  4.7  CL 104 106  --  104  CO2 19*  --   --  20*  GLUCOSE 218* 215*  --  182*  BUN 24* 26*  --  23  CREATININE 2.35* 2.50* 2.37* 2.45*  CALCIUM  9.1  --   --  8.4*  GFRNONAA 29*  --  29* 28*  ANIONGAP 14  --   --  13    Recent Labs  Lab 11/07/23 0958  PROT 6.3*  ALBUMIN  3.7  AST 18  ALT 20  ALKPHOS 122  BILITOT 0.3   Lipids  Recent Labs  Lab 11/08/23 0346  CHOL 260*  TRIG 522*  HDL 24*  LDLCALC UNABLE TO CALCULATE IF TRIGLYCERIDE OVER 400 mg/dL  CHOLHDL 89.1   Hematology Recent Labs  Lab 11/07/23 1333 11/08/23 0346  WBC 4.5 4.3  RBC 3.98* 3.54*  HGB 11.4* 10.1*  HCT 33.8* 30.4*  MCV 84.9 85.9  MCH 28.6 28.5  MCHC 33.7 33.2  RDW 13.3 13.6  PLT 129* 117*   Thyroid No results for input(s): TSH, FREET4 in the last 168 hours. BNPNo results for input(s): BNP, PROBNP in the last 168 hours.  DDimer No results for input(s): DDIMER in the last 168 hours.  Radiology/Studies:  DG Chest Port 1 View Result Date: 11/07/2023 CLINICAL DATA:  stemi EXAM: PORTABLE CHEST - 1 VIEW COMPARISON:  March 23, 2021 FINDINGS: No focal airspace consolidation, pleural effusion, or pneumothorax. Mild cardiomegaly. Sternotomy wires and CABG markers. Tortuous aorta with aortic atherosclerosis. No acute fracture or destructive lesions. Multilevel thoracic osteophytosis. Cervical fusion hardware. Surgical clips along the right lung base. IMPRESSION: No acute cardiopulmonary abnormality. Electronically Signed   By: Rogelia Myers M.D.   On: 11/07/2023 13:13  Assessment and Plan: STEMI involving the RCA:  - Complex PCI by Dr. Wonda with excellent results.  - Bedside TTE with LVEF ~40%; appears to have some inferoseptal/apical hypokinesis. Official TTE pending.  - Telemetry with NSR.  - Continue Brilinta / ASA.  - CHB yesterday briefly requiring temp pacer. Will hold off on BB today.  - Transfer  to floor. Team C to follow. Likely D/C tomorrow AM.  Liver transplant - S/P liver transplant in 2022 at [facility] - Continue cellcept 250mg  BID - Hydrocortisone 10mg  - 5mg   - Rapamune 1mg   - lsavuconazonium sulfate 372.  Chronic kidney disease stage IIIb with creatinine 2.5.   - sCr 2.45 today; no med changes. Can add on tomorrow.  Lipids: Patient with hypercholesterolemia, total cholesterol 284, triglycerides 708, unable to calculate LDL.  Patient statin intolerant.  Will need to explore this with him in determine what lipid-lowering medication he can tolerate.    Bonney Ria Commander, DO  11/08/2023 10:44 AM

## 2023-11-09 ENCOUNTER — Encounter: Payer: Self-pay | Admitting: Oncology

## 2023-11-09 ENCOUNTER — Telehealth (HOSPITAL_COMMUNITY): Payer: Self-pay | Admitting: Pharmacist

## 2023-11-09 ENCOUNTER — Other Ambulatory Visit (HOSPITAL_COMMUNITY): Payer: Self-pay

## 2023-11-09 DIAGNOSIS — I2510 Atherosclerotic heart disease of native coronary artery without angina pectoris: Secondary | ICD-10-CM | POA: Diagnosis not present

## 2023-11-09 DIAGNOSIS — E785 Hyperlipidemia, unspecified: Secondary | ICD-10-CM | POA: Diagnosis not present

## 2023-11-09 DIAGNOSIS — I2111 ST elevation (STEMI) myocardial infarction involving right coronary artery: Secondary | ICD-10-CM | POA: Diagnosis not present

## 2023-11-09 DIAGNOSIS — I472 Ventricular tachycardia, unspecified: Secondary | ICD-10-CM

## 2023-11-09 LAB — CBC
HCT: 30.5 % — ABNORMAL LOW (ref 39.0–52.0)
Hemoglobin: 10.3 g/dL — ABNORMAL LOW (ref 13.0–17.0)
MCH: 29.3 pg (ref 26.0–34.0)
MCHC: 33.8 g/dL (ref 30.0–36.0)
MCV: 86.6 fL (ref 80.0–100.0)
Platelets: 118 10*3/uL — ABNORMAL LOW (ref 150–400)
RBC: 3.52 MIL/uL — ABNORMAL LOW (ref 4.22–5.81)
RDW: 13.6 % (ref 11.5–15.5)
WBC: 4.5 10*3/uL (ref 4.0–10.5)
nRBC: 0 % (ref 0.0–0.2)

## 2023-11-09 LAB — LIPOPROTEIN A (LPA): Lipoprotein (a): 274.8 nmol/L — ABNORMAL HIGH (ref <75.0)

## 2023-11-09 LAB — BASIC METABOLIC PANEL WITH GFR
Anion gap: 9 (ref 5–15)
BUN: 25 mg/dL — ABNORMAL HIGH (ref 8–23)
CO2: 22 mmol/L (ref 22–32)
Calcium: 8 mg/dL — ABNORMAL LOW (ref 8.9–10.3)
Chloride: 102 mmol/L (ref 98–111)
Creatinine, Ser: 2.41 mg/dL — ABNORMAL HIGH (ref 0.61–1.24)
GFR, Estimated: 29 mL/min — ABNORMAL LOW (ref >60)
Glucose, Bld: 189 mg/dL — ABNORMAL HIGH (ref 70–99)
Potassium: 3.9 mmol/L (ref 3.5–5.1)
Sodium: 133 mmol/L — ABNORMAL LOW (ref 135–145)

## 2023-11-09 LAB — LIDOCAINE LEVEL: Lidocaine Lvl: 1.4 ug/mL (ref 1.5–5.0)

## 2023-11-09 LAB — POCT ACTIVATED CLOTTING TIME: Activated Clotting Time: 262 s

## 2023-11-09 LAB — MAGNESIUM: Magnesium: 2.3 mg/dL (ref 1.7–2.4)

## 2023-11-09 MED ORDER — METOPROLOL SUCCINATE ER 25 MG PO TB24
25.0000 mg | ORAL_TABLET | Freq: Every day | ORAL | Status: DC
  Administered 2023-11-09 – 2023-11-11 (×3): 25 mg via ORAL
  Filled 2023-11-09 (×3): qty 1

## 2023-11-09 MED ORDER — ICOSAPENT ETHYL 1 G PO CAPS
2.0000 g | ORAL_CAPSULE | Freq: Two times a day (BID) | ORAL | Status: DC
  Administered 2023-11-09 – 2023-11-11 (×5): 2 g via ORAL
  Filled 2023-11-09 (×5): qty 2

## 2023-11-09 MED ORDER — EZETIMIBE 10 MG PO TABS
10.0000 mg | ORAL_TABLET | Freq: Every day | ORAL | Status: DC
  Administered 2023-11-09 – 2023-11-11 (×3): 10 mg via ORAL
  Filled 2023-11-09 (×3): qty 1

## 2023-11-09 MED ORDER — LIDOCAINE IN D5W 4-5 MG/ML-% IV SOLN
1.0000 mg/min | INTRAVENOUS | Status: DC
  Administered 2023-11-09: 1 mg/min via INTRAVENOUS
  Filled 2023-11-09 (×2): qty 500

## 2023-11-09 MED ORDER — MAGNESIUM SULFATE 2 GM/50ML IV SOLN
2.0000 g | Freq: Once | INTRAVENOUS | Status: AC
  Administered 2023-11-09: 2 g via INTRAVENOUS
  Filled 2023-11-09: qty 50

## 2023-11-09 MED ORDER — AMIODARONE IV BOLUS ONLY 150 MG/100ML
INTRAVENOUS | Status: AC
  Filled 2023-11-09: qty 100

## 2023-11-09 MED ORDER — POTASSIUM CHLORIDE CRYS ER 20 MEQ PO TBCR
20.0000 meq | EXTENDED_RELEASE_TABLET | Freq: Once | ORAL | Status: AC
  Administered 2023-11-09: 20 meq via ORAL
  Filled 2023-11-09: qty 1

## 2023-11-09 MED ORDER — AMIODARONE IV BOLUS ONLY 150 MG/100ML
150.0000 mg | Freq: Once | INTRAVENOUS | Status: AC
  Administered 2023-11-09: 150 mg via INTRAVENOUS

## 2023-11-09 MED ORDER — LIDOCAINE BOLUS VIA INFUSION
100.0000 mg | Freq: Once | INTRAVENOUS | Status: AC
  Administered 2023-11-09: 100 mg via INTRAVENOUS
  Filled 2023-11-09: qty 100

## 2023-11-09 MED FILL — Lidocaine HCl Local Preservative Free (PF) Inj 1%: INTRAMUSCULAR | Qty: 30 | Status: AC

## 2023-11-09 MED FILL — Nitroglycerin IV Soln 100 MCG/ML in D5W: INTRA_ARTERIAL | Qty: 10 | Status: AC

## 2023-11-09 NOTE — Progress Notes (Signed)
 I was notified by CCMD that pt was having Vtach. I went to assess  pt. Pt denied CP, SOB, palipitation, Charge nurse came into room to assist. RR called. Dr. Donnel was paged.

## 2023-11-09 NOTE — Consult Note (Addendum)
 Cardiology Consultation   Patient ID: EISA NECAISE MRN: [id number]; DOB: 04-Oct-1955  Admit date: 11/07/2023 Date of Consult: 11/09/2023  PCP:  [facility]   [facility] Providers Cardiologist:  None        Patient Profile: CAEDYN RAYGOZA is a 68 y.o. male with a hx of  liver transplant (in 2022 at [facility] on immunosupression), autoimmune disease, and chronic fungal sinusitis  DM, gout, HTN, CKD (IIIb) CAD (CABG ~ 8 years ago)  who is being seen 11/09/2023 for the evaluation of WCT at the request of Dr. Rolan.  History of Present Illness: Mr. Pettet presented with acute onset CP, diaphoresis, EMS called > EKG c/w IW STEMI bradycardic with intermittent CHB and brought directly to the cath lab Cath/PCI  Distal RCA 100% thrombotic occlusion at baseline, treated with primary PCI necessitating a guide extension catheter, with 0% residual stenosis and TIMI-3 flow after stenting with a 3.0 x 20 mm Synergy DES  2.  Severe distal left main stenosis 3.  Total occlusion of the proximal LAD with patent LIMA to LAD graft 4.  Severe proximal and mid circumflex stenoses with a patent saphenous vein graft to ramus intermedius/OM1 5.  Complete heart block complicating acute MI, necessitating temporary venous pacing but resolving after reperfusion 6.  Normal LVEDP 12 mmHg, serial lactate 1.8--->1.5  TTE w/LVEF 40%; appears to have inferoseptal/apical hypokinesis.   He did require temp wire (intra-procedurally) > quickly removed BB avoided Out of ICU 6/29 Developed WCT, felt to be slow VT overnight, largely asymptomatic Tx with amio 150 bolus, lido 100mg , and IV mag > SR Back to ICU 6/29 night  GTTs Lidocaine  @1   LABS K+ 3.9 Mag 2.3 (after mag) BUN/Creat 25/2.41 WBC 10.8 H/H 10/30 Plts 118  Admit Trops 8 , >24,000   He feels fine, denies any CP, SOB currently or last night His only indication that something had changed with the monitor ringing nonstop and Had no  palpitations or cardiac awareness of any kind   Past Medical History:  Diagnosis Date   Arthritis    shoulder- both, neck, spine, GOUT   Diabetes mellitus    Gout    H/O exercise stress test    many yrs. 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 weight loss -over 18 mon. period     Past Surgical History:  Procedure Laterality Date   ANTERIOR CERVICAL DECOMP/DISCECTOMY FUSION N/A 12/20/2012   Procedure: C5-6, C6-7 ANTERIOR CERVICAL DISCECTOMY AND FUSION, ALLOGRAFT, PLATE;  Surgeon: Oneil JAYSON Herald, MD;  Location: [facility] OR;  Service: Orthopedics;  Laterality: N/A;   BACK SURGERY     CORONARY/GRAFT ACUTE MI REVASCULARIZATION N/A 11/07/2023   Procedure: Coronary/Graft Acute MI Revascularization;  Surgeon: Wonda Sharper, MD;  Location: [facility] INVASIVE CV LAB;  Service: Cardiovascular;  Laterality: N/A;   ESOPHAGOGASTRODUODENOSCOPY (EGD) WITH PROPOFOL  Left 12/16/2019   Procedure: ESOPHAGOGASTRODUODENOSCOPY (EGD) WITH PROPOFOL ;  Surgeon: Dianna Specking, MD;  Location: [facility] ENDOSCOPY;  Service: Endoscopy;  Laterality: Left;   ESOPHAGOGASTRODUODENOSCOPY (EGD) WITH PROPOFOL  N/A 02/16/2021   Procedure: ESOPHAGOGASTRODUODENOSCOPY (EGD) WITH PROPOFOL ;  Surgeon: Rollin Dover, MD;  Location: [facility] ENDOSCOPY;  Service: Endoscopy;  Laterality: N/A;   HOT HEMOSTASIS N/A 02/16/2021   Procedure: HOT HEMOSTASIS (ARGON PLASMA  COAGULATION/BICAP);  Surgeon: Rollin Dover, MD;  Location: [facility] ENDOSCOPY;  Service: Endoscopy;  Laterality: N/A;   KNEE SURGERY     osgood slaughter syndrome , 18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 Authorizing Provider  aspirin  81 MG chewable tablet Chew 81 mg by mouth daily.   Yes [provider]  doxycycline (ADOXA) 100 MG tablet Take 100 mg by mouth at bedtime.   Yes [provider]  hydrocortisone (CORTEF) 10 MG tablet Take 5-10 mg by mouth See admin instructions. Take 1 tablet (10mg ) by mouth in the morning and take 1/2 tablet (5mg ) at bedtime.   Yes [provider]  Isavuconazonium Sulfate (CRESEMBA) 186 MG CAPS Take 372 mg by mouth daily.   Yes [provider]  mycophenolate (CELLCEPT) 250 MG capsule Take 250 mg by mouth 2 (two) times daily.   Yes [provider]  omeprazole (PRILOSEC) 40 MG capsule Take 40 mg by mouth daily.   Yes [provider]  sertraline (ZOLOFT) 100 MG tablet Take 100 mg by mouth daily.   Yes [provider]  sirolimus (RAPAMUNE) 1 MG tablet Take 1 mg by mouth daily.   Yes [provider]    Scheduled Meds:  aspirin  EC  81 mg Oral Daily   Chlorhexidine  Gluconate Cloth  6 each Topical Daily   doxycycline  100 mg Oral QHS   enoxaparin (LOVENOX) injection  40 mg Subcutaneous Q24H   hydrocortisone  10 mg Oral Daily   And   hydrocortisone  5 mg Oral QHS   Isavuconazonium Sulfate  372 mg Oral Daily   mycophenolate  250 mg Oral BID   pantoprazole   40 mg Oral Daily   sertraline  100 mg Oral Daily   Sirolimus  1 mg Oral Daily   ticagrelor  90 mg Oral BID   Continuous Infusions:  amiodarone     lidocaine  1 mg/min (11/09/23 0800)   PRN Meds: acetaminophen , amiodarone, nitroGLYCERIN, ondansetron  (ZOFRAN ) IV, polyethylene glycol  Allergies:    Allergies  Allergen Reactions   Bromsite [Bromfenac Sodium] Other (See Comments)    Kidney disorder; serum creatinine raised.   Desyrel  [Trazodone ] Nausea Only   Lotensin [Benazepril] Cough   Mobic [Meloxicam] Other (See Comments)    Hx liver transplant   Neurontin  [Gabapentin] Swelling   Nsaids Other (See Comments)    Kidney Disorder; serum creatinine above reference range   Pork Allergy Other (See Comments)    Gout flares   Pravachol [Pravastatin] Other (See Comments)    Liver enzymes abnormal; outside reference range Myalgias   Statins Other (See Comments)    Liver enzymes abnormal; outside reference range  Myalgias Tried on Pravachol and Zocor   Toradol  [Ketorolac  Tromethamine ] Other (See Comments)    Kidney disorder; serum creatinine above reference range   Zocor [Simvastatin] Other (See Comments)    Liver enzymes abnormal; outside reference range Myalgias   Niaspan [Niacin] Other (See Comments)    Vasomotor symptoms   Zestril [Lisinopril] Swelling, Dermatitis, Rash and Other (See Comments)    Social History:   Social History   Socioeconomic History   Marital status: Married  Spouse name: Not on file   Number of children: Not on file   Years of education: Not on file   Highest education level: Not on file  Occupational History   Not on file  Tobacco Use   Smoking status: Never   Smokeless tobacco: Never  Vaping Use   Vaping status: Never Used  Substance and Sexual Activity   Alcohol  use: Yes    Comment: 2 beers / year- very rare   Drug use: No   Sexual activity: Not on file  Other Topics Concern   Not on file  Social History Narrative   Not on file   Social Drivers of Health   Financial Resource Strain: Low Risk  (05/20/2022)   Received from [facility]   Overall Financial Resource Strain (CARDIA)    Difficulty of Paying Living Expenses: Not very hard  Food Insecurity: No Food Insecurity (11/07/2023)   Hunger Vital Sign    Worried About Running Out of Food in the Last Year: Never true    Ran Out of Food in the Last Year: Never true  Transportation Needs: No Transportation Needs (11/07/2023)   PRAPARE - Administrator, Civil Service (Medical): No    Lack of Transportation (Non-Medical):  No  Physical Activity: Not on file  Stress: No Stress Concern Present (02/10/2020)   Received from [facility] - Occupational Stress Questionnaire    Feeling of Stress : Not at all  Social Connections: Unknown (11/07/2023)   Social Connection and Isolation Panel    Frequency of Communication with Friends and Family: More than three times a week    Frequency of Social Gatherings with Friends and Family: More than three times a week    Attends Religious Services: Patient declined    Database administrator or Organizations: Patient declined    Attends Banker Meetings: Patient declined    Marital Status: Married  Catering manager Violence: Not At Risk (11/07/2023)   Humiliation, Afraid, Rape, and Kick questionnaire    Fear of Current or Ex-Partner: No    Emotionally Abused: No    Physically Abused: No    Sexually Abused: No    Family History:   Family History  Problem Relation Age of Onset   Liver disease Sister    Diabetes Neg Hx      ROS:  Please see the history of present illness.  All other ROS reviewed and negative.     Physical Exam/Data: Vitals:   11/09/23 0600 11/09/23 0700 11/09/23 0750 11/09/23 0800  BP: (!) 116/57 132/62  (!) 111/59  Pulse: (!) 56 69  61  Resp: 20 (!) 24  15  Temp:   97.8 F (36.6 C)   TempSrc:   Oral   SpO2: 98%   92%  Weight:      Height:        Intake/Output Summary (Last 24 hours) at 11/09/2023 0819 Last data filed at 11/09/2023 0800 Gross per 24 hour  Intake 740.72 ml  Output 1200 ml  Net -459.28 ml      11/07/2023   10:00 AM 07/12/2021   11:11 AM 03/23/2021    1:06 PM  Last 3 Weights  Weight (lbs) 216 lb 215 lb 7 oz 281 lb  Weight (kg) 97.977 kg 97.722 kg 127.461 kg     Body mass index is 29.29 kg/m.  General:  Well nourished, well developed, in no acute distress  HEENT: normal Neck: no JVD Vascular: No carotid bruits; Distal pulses 2+ bilaterally Cardiac:  RRR; no  murmur s, gallops or rubs Lungs: CTA b/l, no wheezing, rhonchi or rales  Abd: soft, nontender, no hepatomegaly  Ext: no edema Musculoskeletal:  No deformities Skin: warm and dry  Neuro:  no gross focal abnormalities noted Psych:  Normal affect   EKG:  The EKG was personally reviewed and demonstrates:    #1: SR 63bpm, inf. STEMI SB 59bpm, improving ST changes SR 62bpm, unchanged  TODAY: WCT 124bpm, RBBB morphology, transitions V4 SR 62bpm  Telemetry:  Telemetry was personally reviewed and demonstrates:   SB/SR 50's-60's >> last night started to have some intermittent NSVTs >> developed sustained VT 120's-130's > back to SR 60's  Relevant CV Studies:  11/08/23: TTE 1. There is no left ventricular thrombus (definity contrast was used).  Left ventricular ejection fraction, by estimation, is 40 to 45%. The left  ventricle has mildly decreased function. The left ventricle demonstrates  regional wall motion abnormalities  (see scoring diagram/findings for description). Left ventricular diastolic  parameters are consistent with Grade I diastolic dysfunction (impaired  relaxation). There is severe hypokinesis of the left ventricular,  mid-apical inferoseptal wall, inferior  wall and apical segment.   2. Right ventricular systolic function is moderately reduced. The right  ventricular size is moderately enlarged. The estimated right ventricular  systolic pressure is 16.1 mmHg.   3. Left atrial size was mildly dilated.   4. The mitral valve is normal in structure. Moderate mitral valve  regurgitation. No evidence of mitral stenosis.   5. The aortic valve is grossly normal. Aortic valve regurgitation is not  visualized. No aortic stenosis is present.   6. Evidence of atrial level shunting detected by color flow Doppler.   Comparison(s): Prior images reviewed side by side. Changes from prior  study are noted. The left ventricular function is worsened. The left  ventricular wall motion  new. There is interval reduction in left  ventricular function due to infarction in the  distribution of the right coronary artery.   Laboratory Data: High Sensitivity Troponin:   Recent Labs  Lab 11/07/23 0958 11/07/23 1333  TROPONINIHS 8 >24,000*     Chemistry Recent Labs  Lab 11/07/23 0958 11/07/23 1006 11/07/23 1333 11/08/23 0346 11/08/23 0930 11/09/23 0324  NA 137 137  --  137  --  133*  K 3.9 3.8  --  4.7  --  3.9  CL 104 106  --  104  --  102  CO2 19*  --   --  20*  --  22  GLUCOSE 218* 215*  --  182*  --  189*  BUN 24* 26*  --  23  --  25*  CREATININE 2.35* 2.50* 2.37* 2.45*  --  2.41*  CALCIUM  9.1  --   --  8.4*  --  8.0*  MG  --   --   --   --  1.7 2.3  GFRNONAA 29*  --  29* 28*  --  29*  ANIONGAP 14  --   --  13  --  9    Recent Labs  Lab 11/07/23 0958  PROT 6.3*  ALBUMIN  3.7  AST 18  ALT 20  ALKPHOS 122  BILITOT 0.3   Lipids  Recent Labs  Lab 11/08/23 0346  CHOL 260*  TRIG 522*  HDL 24*  LDLCALC UNABLE TO CALCULATE IF TRIGLYCERIDE OVER 400 mg/dL  CHOLHDL 89.1  Hematology Recent Labs  Lab 11/07/23 1333 11/08/23 0346 11/09/23 0324  WBC 4.5 4.3 4.5  RBC 3.98* 3.54* 3.52*  HGB 11.4* 10.1* 10.3*  HCT 33.8* 30.4* 30.5*  MCV 84.9 85.9 86.6  MCH 28.6 28.5 29.3  MCHC 33.7 33.2 33.8  RDW 13.3 13.6 13.6  PLT 129* 117* 118*   Thyroid No results for input(s): TSH, FREET4 in the last 168 hours.  BNPNo results for input(s): BNP, PROBNP in the last 168 hours.  DDimer No results for input(s): DDIMER in the last 168 hours.  Radiology/Studies:   DG Chest Port 1 View Result Date: 11/07/2023 CLINICAL DATA:  stemi EXAM: PORTABLE CHEST - 1 VIEW COMPARISON:  March 23, 2021 FINDINGS: No focal airspace consolidation, pleural effusion, or pneumothorax. Mild cardiomegaly. Sternotomy wires and CABG markers. Tortuous aorta with aortic atherosclerosis. No acute fracture or destructive lesions. Multilevel thoracic osteophytosis. Cervical fusion  hardware. Surgical clips along the right lung base. IMPRESSION: No acute cardiopulmonary abnormality. Electronically Signed   By: Rogelia Myers M.D.   On: 11/07/2023 13:13  .     Assessment and Plan: WCT C/w VT Slow and hemodynamically tolerated Remains on lido gtt  BB have been avoided given some CHB noted in the cath lab For now Gracianna Vink continue lidocaine  gtt Unclear if there is indication for ICD he is high risk given  -- immunosuppression -- inability to stop DAPT -- chronic fungal sinusitis (s/p bone grafting)  EP MD  Might consider add on low dose BB, +/- mexiletine     For questions or updates, please contact [facility] Please consult [email or web address] for contact info under    Signed, Charlies Macario Arthur, PA-C  11/09/2023 8:19 AM  I have seen and examined this patient with Charlies Arthur.  Agree with above, note added to reflect my findings.  Patient with a past medical history as above.  He is status post STEMI with RCA stenting.  Review of EKG this morning shows persistently elevated ST overnight last night, he had wide-complex tachycardia with heart rates in the 120s consistent with ventricular tachycardia.  Patient was asymptomatic.  He was alerted to his arrhythmia telemetry monitoring alarms.  He was moved back to the ICU given a bolus of amiodarone and lidocaine .  He is now on high he has had no further arrhythmias.  GEN: No acute distress.   Neck: No JVD Cardiac: RRR, no murmurs, rubs, or gallops.  Respiratory: normal BS bases bilaterally. GI: Soft, nontender, non-distended  MS: No edema; No deformity. Neuro:  Nonfocal  Skin: warm and dry Psych: Normal affect    Ventricular tachycardia: Hemodynamically tolerated.  Now on lidocaine ..  No further episodes.  Continue lidocaine  for the next 24 hours and likely switch to mexiletine.  Tahiry Spicer add beta-blocker.  He did have significant heart block and bradycardia STEMI but has AV conduction has recovered. Coronary  arteries post inferior STEMI: Post stenting.  Plan per primary team.  Jagar Lua M. Devyn Sheerin MD 11/09/2023 1:42 PM

## 2023-11-09 NOTE — Progress Notes (Addendum)
 Advanced Heart Failure Rounding Note  Cardiologist: None  Chief Complaint: Chest pain Subjective:   Bedside TTE 6/29 with LVEF 40%; appears to have inferoseptal/apical hypokinesis.   Patient with WCVT early this morning, transferred back to 2H. Started on lidocaine  gtt and amiodarone bolus ordered. Asymptomatic.   Feels good this morning. Wants to get up and move around. Denies CP/SOB.   Objective:   Weight Range: 98 kg Body mass index is 29.29 kg/m.   Vital Signs:   Temp:  [97.8 F (36.6 C)-99.5 F (37.5 C)] 97.8 F (36.6 C) (06/30 0750) Pulse Rate:  [56-128] 61 (06/30 0800) Resp:  [11-26] 15 (06/30 0800) BP: (94-132)/(47-99) 111/59 (06/30 0800) SpO2:  [89 %-98 %] 92 % (06/30 0800) Last BM Date : 11/06/23  Weight change: Filed Weights   11/07/23 1000  Weight: 98 kg   Intake/Output   Intake/Output Summary (Last 24 hours) at 11/09/2023 0809 Last data filed at 11/09/2023 0800 Gross per 24 hour  Intake 740.72 ml  Output 1200 ml  Net -459.28 ml    Physical Exam  General:  elderly appearing.  No respiratory difficulty Neck: supple. JVD flat.  Cor: PMI nondisplaced. Regular rate & rhythm. No rubs, gallops or murmurs. Lungs: clear Extremities: no cyanosis, clubbing, rash, edema  Neuro: alert & oriented x 3. Affect pleasant.   Telemetry   NSR 60s (Personally reviewed)    EKG    No new EKG to review  Labs    CBC Recent Labs    11/07/23 0958 11/07/23 1006 11/08/23 0346 11/09/23 0324  WBC 2.7*   < > 4.3 4.5  NEUTROABS 1.6*  --   --   --   HGB 11.4*   < > 10.1* 10.3*  HCT 34.0*   < > 30.4* 30.5*  MCV 84.8   < > 85.9 86.6  PLT 110*   < > 117* 118*   < > = values in this interval not displayed.   Basic Metabolic Panel Recent Labs    93/70/74 0346 11/08/23 0930 11/09/23 0324  NA 137  --  133*  K 4.7  --  3.9  CL 104  --  102  CO2 20*  --  22  GLUCOSE 182*  --  189*  BUN 23  --  25*  CREATININE 2.45*  --  2.41*  CALCIUM  8.4*  --  8.0*  MG   --  1.7 2.3   Liver Function Tests Recent Labs    11/07/23 0958  AST 18  ALT 20  ALKPHOS 122  BILITOT 0.3  PROT 6.3*  ALBUMIN  3.7   No results for input(s): LIPASE, AMYLASE in the last 72 hours. Cardiac Enzymes No results for input(s): CKTOTAL, CKMB, CKMBINDEX, TROPONINI in the last 72 hours.  BNP: BNP (last 3 results) No results for input(s): BNP in the last 8760 hours.  ProBNP (last 3 results) No results for input(s): PROBNP in the last 8760 hours.   D-Dimer No results for input(s): DDIMER in the last 72 hours. Hemoglobin A1C Recent Labs    11/08/23 0346  HGBA1C 7.9*   Fasting Lipid Panel Recent Labs    11/08/23 0346  CHOL 260*  HDL 24*  LDLCALC UNABLE TO CALCULATE IF TRIGLYCERIDE OVER 400 mg/dL  TRIG 477*  CHOLHDL 89.1  LDLDIRECT 155*   Thyroid Function Tests No results for input(s): TSH, T4TOTAL, T3FREE, THYROIDAB in the last 72 hours.  Invalid input(s): FREET3  Other results:   Imaging    ECHOCARDIOGRAM  COMPLETE Result Date: 11/08/2023    ECHOCARDIOGRAM REPORT   Patient Name:   Randall Frazier Date of Exam: 11/08/2023 Medical Rec #:  [id number]      Height:       72.0 in Accession #:    [id number]     Weight:       216.0 lb Date of Birth:  07-02-1955      BSA:          2.201 m Patient Age:    68 years       BP:           95/51 mmHg Patient Gender: M              HR:           62 bpm. Exam Location:  Inpatient Procedure: 2D Echo, Cardiac Doppler, Color Doppler and Intracardiac            Opacification Agent (Both Spectral and Color Flow Doppler were            utilized during procedure). Indications:    I25.110 Atherosclerotic heart disease of native coronary artery                 with unstable angina pectoris; 122-I22.9 Subsequent ST elevation                 (STEM) and non-ST elevation (NSTEMI) myocardial infarction  History:        Patient has prior history of Echocardiogram examinations, most                 recent 09/14/2019.  Previous Myocardial Infarction and CAD,                 Abnormal ECG, Arrythmias:Bradycardia, Signs/Symptoms:Altered                 Mental Status, Shortness of Breath, Dyspnea and Chest Pain; Risk                 Factors:Hypertension. PFO.  Sonographer:    Ellouise Mose RDCS Referring Phys: [phone] MICHAEL COOPER  Sonographer Comments: Technically difficult study due to poor echo windows. IMPRESSIONS  1. There is no left ventricular thrombus (definity contrast was used). Left ventricular ejection fraction, by estimation, is 40 to 45%. The left ventricle has mildly decreased function. The left ventricle demonstrates regional wall motion abnormalities (see scoring diagram/findings for description). Left ventricular diastolic parameters are consistent with Grade I diastolic dysfunction (impaired relaxation). There is severe hypokinesis of the left ventricular, mid-apical inferoseptal wall, inferior wall and apical segment.  2. Right ventricular systolic function is moderately reduced. The right ventricular size is moderately enlarged. The estimated right ventricular systolic pressure is 16.1 mmHg.  3. Left atrial size was mildly dilated.  4. The mitral valve is normal in structure. Moderate mitral valve regurgitation. No evidence of mitral stenosis.  5. The aortic valve is grossly normal. Aortic valve regurgitation is not visualized. No aortic stenosis is present.  6. Evidence of atrial level shunting detected by color flow Doppler. Comparison(s): Prior images reviewed side by side. Changes from prior study are noted. The left ventricular function is worsened. The left ventricular wall motion new. There is interval reduction in left ventricular function due to infarction in the distribution of the right coronary artery. Conclusion(s)/Recommendation(s): No left ventricular mural or apical thrombus/thrombi. FINDINGS  Left Ventricle: There is no left ventricular thrombus (definity contrast was used). Left ventricular ejection  fraction, by estimation, is  40 to 45%. The left ventricle has mildly decreased function. The left ventricle demonstrates regional wall motion abnormalities. Severe hypokinesis of the left ventricular, mid-apical inferoseptal wall, inferior wall and apical segment. Definity contrast agent was given IV to delineate the left ventricular endocardial borders. The left ventricular internal cavity size was normal in size. There is no left ventricular hypertrophy. Left ventricular diastolic parameters are consistent with Grade I diastolic dysfunction (impaired relaxation). Normal left ventricular filling pressure.  LV Wall Scoring: The mid and distal inferior wall, mid inferoseptal segment, apical septal segment, and apex are hypokinetic. Right Ventricle: The right ventricular size is moderately enlarged. No increase in right ventricular wall thickness. Right ventricular systolic function is moderately reduced. The tricuspid regurgitant velocity is 1.81 m/s, and with an assumed right atrial pressure of 3 mmHg, the estimated right ventricular systolic pressure is 16.1 mmHg. Left Atrium: Left atrial size was mildly dilated. Right Atrium: Right atrial size was normal in size. Pericardium: There is no evidence of pericardial effusion. Mitral Valve: The mitral valve is normal in structure. Moderate mitral valve regurgitation, with centrally-directed jet. No evidence of mitral valve stenosis. Tricuspid Valve: The tricuspid valve is normal in structure. Tricuspid valve regurgitation is trivial. No evidence of tricuspid stenosis. Aortic Valve: The aortic valve is grossly normal. Aortic valve regurgitation is not visualized. No aortic stenosis is present. Pulmonic Valve: The pulmonic valve was normal in structure. Pulmonic valve regurgitation is not visualized. No evidence of pulmonic stenosis. Aorta: The aortic root and ascending aorta are structurally normal, with no evidence of dilitation. IAS/Shunts: Evidence of atrial level  shunting detected by color flow Doppler.  LEFT VENTRICLE PLAX 2D LVIDd:         4.60 cm      Diastology LVIDs:         3.40 cm      LV e' medial:    5.87 cm/s LV PW:         1.10 cm      LV E/e' medial:  12.7 LV IVS:        1.20 cm      LV e' lateral:   11.20 cm/s LVOT diam:     2.30 cm      LV E/e' lateral: 6.7 LV SV:         85 LV SV Index:   39 LVOT Area:     4.15 cm  LV Volumes (MOD) LV vol d, MOD A2C: 81.2 ml LV vol d, MOD A4C: 113.0 ml LV vol s, MOD A2C: 45.9 ml LV vol s, MOD A4C: 66.6 ml LV SV MOD A2C:     35.3 ml LV SV MOD A4C:     113.0 ml LV SV MOD BP:      41.6 ml RIGHT VENTRICLE            IVC RV S prime:     4.90 cm/s  IVC diam: 1.30 cm TAPSE (M-mode): 0.3 cm LEFT ATRIUM             Index        RIGHT ATRIUM           Index LA diam:        4.10 cm 1.86 cm/m   RA Area:     10.70 cm LA Vol (A2C):   24.2 ml 10.99 ml/m  RA Volume:   21.80 ml  9.90 ml/m LA Vol (A4C):   40.1 ml 18.22 ml/m LA Biplane Vol: 32.6 ml 14.81  ml/m  AORTIC VALVE LVOT Vmax:   96.60 cm/s LVOT Vmean:  61.600 cm/s LVOT VTI:    0.205 m  AORTA Ao Root diam: 3.60 cm Ao Asc diam:  3.60 cm MITRAL VALVE               TRICUSPID VALVE MV Area (PHT): 3.17 cm    TR Peak grad:   13.1 mmHg MV Decel Time: 239 msec    TR Vmax:        181.00 cm/s MV E velocity: 74.50 cm/s MV A velocity: 94.60 cm/s  SHUNTS MV E/A ratio:  0.79        Systemic VTI:  0.20 m                            Systemic Diam: 2.30 cm Mihai Croitoru MD Electronically signed by Jerel Balding MD Signature Date/Time: 11/08/2023/12:16:43 PM    Final      Medications:     Scheduled Medications:  aspirin  EC  81 mg Oral Daily   Chlorhexidine  Gluconate Cloth  6 each Topical Daily   doxycycline  100 mg Oral QHS   enoxaparin (LOVENOX) injection  40 mg Subcutaneous Q24H   hydrocortisone  10 mg Oral Daily   And   hydrocortisone  5 mg Oral QHS   Isavuconazonium Sulfate  372 mg Oral Daily   mycophenolate  250 mg Oral BID   pantoprazole   40 mg Oral Daily   sertraline  100 mg  Oral Daily   Sirolimus  1 mg Oral Daily   ticagrelor  90 mg Oral BID    Infusions:  amiodarone     lidocaine  1 mg/min (11/09/23 0800)    PRN Medications: acetaminophen , amiodarone, nitroGLYCERIN, ondansetron  (ZOFRAN ) IV, polyethylene glycol  Patient Profile   Randall Frazier is a 68 y.o. male with the complicated medical history who is being seen 11/08/2023 for the evaluation of inferior STEMI.   Assessment/Plan  STEMI involving the RCA:  - Complex PCI by Dr. Wonda with excellent results.  - Bedside TTE with LVEF ~40%; appears to have some inferoseptal/apical hypokinesis.  - Echo 6/29: EF 40-45%, LV with RWMA, GIDD, severe HK of LV mid-apical inferoseptal wall, inferior wall and apical segment. RV mod reduced. LA mildly dilated Mod MR.  - Telemetry with NSR.  - Continue Brilinta / ASA.  - CHB 6/28 briefly requiring temp pacer, now removed. Will hold off on BB today.  Liver transplant - S/P liver transplant in 2022 at [facility] - Continue cellcept 250mg  BID - Hydrocortisone 10mg  - 5mg   - Rapamune 1mg   - lsavuconazonium sulfate 372.  Chronic kidney disease stage IIIb with creatinine 2.5.   - sCr 2.41 today; no med changes.  Lipids - Patient with hypercholesterolemia, total cholesterol 284, triglycerides 708, unable to calculate LDL.  Patient statin intolerant.  Will need to explore this with him in determine what lipid-lowering medication he can tolerate. - Will need referral to lipid clinic at f/u.  Wide complex tachycardia - Patient with WCT early this morning. Converted with amiodarone bolus + lidocaine  gtt. Patient asymptomatic during event.  - Consult EP this morning - Continue lidocaine  gtt, suspect he can come off later today - Check EKG - Consider Zio at discharge - Keep K>4 and Mg >2  CRITICAL CARE Performed by: Beckey LITTIE Coe   Total critical care time: 12 minutes  Critical care time was exclusive of separately billable procedures and treating other  patients.  Critical care was necessary to treat or prevent imminent or life-threatening deterioration.  Critical care was time spent personally by me on the following activities: development of treatment plan with patient and/or surrogate as well as nursing, discussions with consultants, evaluation of patient's response to treatment, examination of patient, obtaining history from patient or surrogate, ordering and performing treatments and interventions, ordering and review of laboratory studies, ordering and review of radiographic studies, pulse oximetry and re-evaluation of patient's condition.   Length of Stay: 2  Beckey LITTIE Coe, NP  11/09/2023, 8:09 AM  Advanced Heart Failure Team Pager [phone] (M-F; 7a - 5p)  Please contact [facility] for night-coverage after hours (5p -7a ) and weekends on [email or web address]   Patient seen with NP, I formulated the plan and agree with the above note.   No further VT since last night.  Now on lidocaine  gtt @ 1.   No chest pain or dyspnea.   General: NAD Neck: No JVD, no thyromegaly or thyroid nodule.  Lungs: Clear to auscultation bilaterally with normal respiratory effort. CV: Nondisplaced PMI.  Heart regular S1/S2, no S3/S4, no murmur.  No peripheral edema.   Abdomen: Soft, nontender, no hepatosplenomegaly, no distention.  Skin: Intact without lesions or rashes.  Neurologic: Alert and oriented x 3.  Psych: Normal affect. Extremities: No clubbing or cyanosis.  HEENT: Normal.   Post-MI echo with EF 40-45%, inferoseptal/inferior severe HK, moderate MR, possible PFO.  Not volume overloaded on exam.  GDMT limited by CKD stage 3, creatinine 2.41 today.  - Adding Toprol  XL 25 mg daily as above.   Patient had transient CHB peri-MI, resolved with reperfusion.  Will add Toprol  XL carefully as above.   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  Occurred within 48 hrs of acute MI.  - Continue lidocaine  today, if no further VT, to mexiletine tomorrow.  - If no  further VT, would hold off on ICD for now and repeat echo at 3 months. Would consider Lifevest, discuss with EP.  - Discussed with EP, will get cMRI as outpatient for scar burden.  - Adding BB.   Stable creatinine at 2.4, this is baseline.   Post-inferior MI, s/p DES distal RCA.  Has residual disease mid-distal LCx.  - Continue ASA/ticagrelor.  - Cannot take statins.  TGs high.  Will start Zetia and Vascepa here, aim for Repatha as outpatient.   On doxycycline for h/o tick bite.   H/o fungal sinusitis, continuing outpatient isavuconazonium.   H/o liver transplant, continue home immunosuppressants.   CRITICAL CARE Performed by: Ezra Shuck  Total critical care time: 4-0 minutes  Critical care time was exclusive of separately billable procedures and treating other patients.  Critical care was necessary to treat or prevent imminent or life-threatening deterioration.  Critical care was time spent personally by me on the following activities: development of treatment plan with patient and/or surrogate as well as nursing, discussions with consultants, evaluation of patient's response to treatment, examination of patient, obtaining history from patient or surrogate, ordering and performing treatments and interventions, ordering and review of laboratory studies, ordering and review of radiographic studies, pulse oximetry and re-evaluation of patient's condition.  Ezra Shuck 11/09/2023 10:18 AM

## 2023-11-09 NOTE — Plan of Care (Signed)
  Problem: Education: Goal: Knowledge of General Education information will improve Description: Including pain rating scale, medication(s)/side effects and non-pharmacologic comfort measures Outcome: Progressing   Problem: Clinical Measurements: Goal: Ability to maintain clinical measurements within normal limits will improve Outcome: Progressing   Problem: Activity: Goal: Risk for activity intolerance will decrease Outcome: Progressing   Problem: Elimination: Goal: Will not experience complications related to bowel motility Outcome: Progressing   Problem: Pain Managment: Goal: General experience of comfort will improve and/or be controlled Outcome: Progressing   Problem: Activity: Goal: Ability to tolerate increased activity will improve Outcome: Progressing   Problem: Cardiac: Goal: Ability to achieve and maintain adequate cardiovascular perfusion will improve Outcome: Progressing   Problem: Cardiovascular: Goal: Ability to achieve and maintain adequate cardiovascular perfusion will improve Outcome: Progressing

## 2023-11-09 NOTE — TOC Initial Note (Signed)
 Transition of Care [facility]) - Initial/Assessment Note    Patient Details  Name: Randall Frazier MRN: [id number] Date of Birth: 1955-09-28  Transition of Care [facility]) CM/SW Contact:    Justina Delcia Czar, RN Phone Number: [phone] 11/09/2023, 5:50 PM  Clinical Narrative:                 Spoke to pt and wife is his caregiver, covered by his [facility] benefits. Has RW and cane at home. Had HH in the past.  Will continue to follow for dc needs.  Expected Discharge Plan: Home w Home Health Services Barriers to Discharge: Continued Medical Work up   Patient Goals and CMS Choice Patient states their goals for this hospitalization and ongoing recovery are:: wants to get better CMS Medicare.gov Compare Post Acute Care list provided to:: Patient Choice offered to / list presented to : Patient      Expected Discharge Plan and Services   Discharge Planning Services: CM Consult Post Acute Care Choice: Home Health                                        Prior Living Arrangements/Services     Patient language and need for interpreter reviewed:: Yes Do you feel safe going back to the place where you live?: Yes      Need for Family Participation in Patient Care: Yes (Comment) Care giver support system in place?: Yes (comment)   Criminal Activity/Legal Involvement Pertinent to Current Situation/Hospitalization: No - Comment as needed  Activities of Daily Living   ADL Screening (condition at time of admission) Independently performs ADLs?: Yes (appropriate for developmental age) Is the patient deaf or have difficulty hearing?: Yes Does the patient have difficulty seeing, even when wearing glasses/contacts?: Yes Does the patient have difficulty concentrating, remembering, or making decisions?: No  Permission Sought/Granted Permission sought to share information with : Case Manager, Family Supports, PCP Permission granted to share information with : Yes, Verbal Permission  Granted  Share Information with NAME: Arsal Tappan  Permission granted to share info w AGENCY: Home Health, DME, PCP  Permission granted to share info w Relationship: wife  Permission granted to share info w Contact Information: [phone]  Emotional Assessment Appearance:: Appears stated age Attitude/Demeanor/Rapport: Engaged Affect (typically observed): Accepting Orientation: : Oriented to Self, Oriented to Place, Oriented to  Time, Oriented to Situation   Psych Involvement: No (comment)  Admission diagnosis:  STEMI (ST elevation myocardial infarction) (HCC) [I21.3] STEMI involving right coronary artery (HCC) [I21.11] ST elevation myocardial infarction involving right coronary artery (HCC) [I21.11] Patient Active Problem List   Diagnosis Date Noted   STEMI involving right coronary artery (HCC) 11/07/2023   ST elevation myocardial infarction involving right coronary artery (HCC) 11/07/2023   Failure to thrive in adult 10/03/2023   Benign essential hypertension 10/03/2023   Cirrhosis of liver (HCC) 10/03/2023   Dental root caries 10/03/2023   Depression 10/03/2023   Encounter for dental examination and cleaning without abnormal findings 10/03/2023   Family history of colonic polyps 10/03/2023   Gastroenteritis 10/03/2023   Hallux valgus with bunions 10/03/2023   Hypertension 10/03/2023   Idiopathic aseptic necrosis of bone (HCC) 10/03/2023   Trauma and stressor-related disorder 10/03/2023   Pain in joint of right shoulder 10/03/2023   Stress and adjustment reaction 10/03/2023   Acute infection of sinus 10/03/2023   Acute sinusitis, unspecified 10/03/2023  Other adrenocortical insufficiency (HCC) 10/03/2023   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   Unspecified adrenocortical insufficiency (HCC) 10/03/2023   Anemia, unspecified 10/03/2023   Arthropathy 10/03/2023   Awaiting organ transplant status 10/03/2023   Chronic renal insufficiency 10/03/2023   Chronic  kidney disease, unspecified 10/03/2023   Coagulopathy (HCC) 10/03/2023   Disease of sebaceous glands 10/03/2023   Myositis 10/03/2023   Dysphagia, unspecified 10/03/2023   Edema, unspecified 10/03/2023   Presence of other specified devices 10/03/2023   Exposure to potentially hazardous substance 10/03/2023   End stage renal disease (HCC) 10/03/2023   Gout 10/03/2023   Hepatosplenomegaly 10/03/2023   Hearing loss 10/03/2023   Other male erectile dysfunction 10/03/2023   Aspergillosis, unspecified (HCC) 10/03/2023   Other forms of aspergillosis (HCC) 10/03/2023   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   Leukopenia 10/03/2023   Major depressive disorder, single episode, moderate (HCC) 10/03/2023   Low back pain 10/03/2023   Major depressive disorder, recurrent, moderate (HCC) 10/03/2023   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   Muscle weakness (generalized) 10/03/2023   Necrotizing fasciitis (HCC) 10/03/2023   Fatty liver 10/03/2023   Need for assistance with personal care 10/03/2023   Other atopic dermatitis 10/03/2023   Morbid obesity (HCC) 10/03/2023   Other specified postprocedural states 10/03/2023   Pain in left foot 10/03/2023   Other speech disturbances 10/03/2023   Peripheral neuropathy 10/03/2023   Encounter for other preprocedural examination 10/03/2023   Encounter for screening examination for mental health and behavioral disorders, unspecified 10/03/2023   Encounter for screening for cardiovascular disorders 10/03/2023   Other specified counseling 10/03/2023   Person encountering health services to consult on behalf of another person 10/03/2023   Counseling, unspecified 10/03/2023   Problem related to unspecified psychosocial circumstances 10/03/2023   PTSD (post-traumatic stress disorder) 10/03/2023   Spinal stenosis in cervical region 10/03/2023   Transportation insecurity 10/03/2023   Unilateral primary osteoarthritis, left knee 10/03/2023   Unspecified abnormalities of  gait and mobility 10/03/2023   Unspecified blepharitis unspecified eye, unspecified eyelid 10/03/2023   Vitamin D deficiency 10/03/2023   Type 2 diabetes mellitus without complications (HCC) 10/03/2023   Myositis 10/03/2023   Nausea with vomiting, unspecified 10/03/2023   Hyperlipidemia 10/03/2023   Mixed hyperlipidemia 10/03/2023   Esophageal varices (HCC) 10/03/2023   Seborrheic dermatitis, unspecified 10/03/2023   Arteriosclerosis of coronary artery 10/03/2023   Decompensation of cirrhosis of liver (HCC) 10/03/2023   Depressive disorder 10/03/2023   S/P flap graft 02/09/2023   Adrenal insufficiency (HCC) 03/31/2022   Heart valve disease 03/31/2022   Chronic kidney disease (CKD), stage IV (severe) (HCC) 09/19/2021   Invasive fungal sinusitis 09/19/2021   Iron  deficiency anemia 07/12/2021   At risk for opportunistic infections 05/24/2021   Immunosuppression (HCC) 05/24/2021   Mouth sore 05/20/2021   Poor dentition 05/20/2021   Hypoxia 05/02/2021   Delirium 04/16/2021   Diabetes mellitus (HCC) 04/03/2021   Anemia 03/30/2021   Liver transplant recipient [facility]) 03/27/2021   Presence of transplanted liver (HCC) 03/27/2021   Liver transplant status (HCC) 03/26/2021   Hematemesis with nausea    GIB (gastrointestinal bleeding) 02/16/2021   Other cirrhosis of liver (HCC)    GI bleed 02/15/2021   Pre-transplant evaluation for liver transplant 05/09/2020   Hepatic encephalopathy (HCC) 02/19/2020   NAFLD (nonalcoholic fatty liver disease) 90/69/7978   Cirrhosis, non-alcoholic (HCC) 02/09/2020   Portal hypertension with esophageal varices (HCC) 12/16/2019   Duodenal ulcer 12/16/2019   Gastric ulcer 12/16/2019   Gastritis 12/16/2019   Type 2  diabetes mellitus (HCC) 12/15/2019   Decompensated hepatic cirrhosis (HCC) 12/15/2019   Hyperlipemia 12/15/2019   Dyspnea 12/15/2019   Thrombocytopenia (HCC) 12/15/2019   AKI (acute kidney injury) (HCC) 12/15/2019   Hypoalbuminemia 12/15/2019    Acute hepatic encephalopathy (HCC) 12/15/2019   Hyponatremia 12/15/2019   Cellulitis of right leg 12/15/2019   Melena 12/14/2019   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   Arthritis of shoulder region 07/02/2015   Chest pain syndrome 04/24/2015   Atherosclerotic heart disease of native coronary artery without angina pectoris 03/06/2015   Essential hypertension 03/06/2015   DJD of right shoulder 03/06/2015   Non morbid obesity due to excess calories 03/06/2015   Type 2 diabetes mellitus with diabetic polyneuropathy, with long-term current use of insulin  (HCC) 03/06/2015   DOE (dyspnea on exertion) 03/06/2015   Cervical spondylosis without myelopathy 12/20/2012    Class: Diagnosis of   Biliary dyskinesia 10/05/2012   PCP:  [facility]   [facility] - [place], [place] - [address] [place] [address] Phone: [phone] Fax: [phone]  [id number] - [address] [place] [address] [place] [address] Phone: [phone] Fax: [phone]  [facility] [address] [place] [address] Phone: [phone] Fax: [phone]     Social Drivers of Health (SDOH) Social History: SDOH Screenings   Food Insecurity: No Food Insecurity (11/07/2023)  Housing: Low Risk  (11/07/2023)  Transportation Needs: No Transportation Needs (11/07/2023)  Utilities: Not At Risk (11/07/2023)  Financial Resource Strain: Low Risk  (05/20/2022)   Received from [facility]  Social Connections: Unknown (11/07/2023)  Stress: No Stress Concern Present (02/10/2020)   Received from [facility]  Tobacco Use: Low Risk  (11/07/2023)   SDOH Interventions:     Readmission Risk Interventions     No data to display

## 2023-11-09 NOTE — Plan of Care (Signed)

## 2023-11-09 NOTE — Progress Notes (Signed)
 Dr. Donnel was made aware that pt was having frequent 4-7 beats of Vtach.

## 2023-11-09 NOTE — Progress Notes (Signed)
 I was called urgently by nurse for sustained episode of V. tach heart rate 120-130s  Patient completely asymptomatic, normal blood pressure, denies any chest pain or palpitations. Reviewed his history, recent inferior STEMI status post PCI to RCA, has significant need to left disease but patent LIMA to LAD and SVG to OM/ramus.SABRA Pac him IV 150 mg amiodarone did not convert. Just prior to starting 100 mg IV lidocaine  bolus he converted to normal sinus rhythm  Sustained V. tach, slow heart rate 120 bpm status post converted normal sinus rhythm after amiodarone just before the bolus lido  We will continue lidocaine  drip 1 mg/min. Transfer to ICU. Give him 2 g magnesium . Continue to monitor closely  Randall Frazier

## 2023-11-09 NOTE — Significant Event (Signed)
 Rapid Response Event Note   Reason for Call :  Sustained VT with pulse present  Initial Focused Assessment:  Pt lying in bed with eyes open, in no visible distress. He denies CP/SOB/dizziness. EKG shows Vtach with a rate of 120s, BP-122/84, RR-20, SpO2-96% on RA. Skin warm/dry.  Pt placed on zoll monitor.  EKG obtained-Wide QRS tachycardia Non-specific intra-ventricular conduction block Right ventricular hypertrophy Inferior infarct , age undetermined Anterolateral infarct , age undetermined  Dr. Donnel to bedside.   Amiod and Mag given and pt converted to SR-60s. Lido bolus and gtt started and pt txed to 2H.   Interventions:  150mg  Amiod iv bolus 2g mag IV 100mg  lidocaine  IV bolused Lidocaine  1mg /min initiatied Pt tx to 2H15.  IV team at bedside to start 2nd PIV. Plan of Care:  Per 2H.  Event Summary:   MD Notified: Dr. Donnel notified and came to bedside Call Time:0059 Arrival Time:0102 End Time:0150  Tish Graeme Piety, RN

## 2023-11-10 DIAGNOSIS — I472 Ventricular tachycardia, unspecified: Secondary | ICD-10-CM | POA: Diagnosis not present

## 2023-11-10 DIAGNOSIS — I5021 Acute systolic (congestive) heart failure: Secondary | ICD-10-CM | POA: Diagnosis not present

## 2023-11-10 DIAGNOSIS — I2510 Atherosclerotic heart disease of native coronary artery without angina pectoris: Secondary | ICD-10-CM | POA: Diagnosis not present

## 2023-11-10 DIAGNOSIS — E785 Hyperlipidemia, unspecified: Secondary | ICD-10-CM | POA: Diagnosis not present

## 2023-11-10 DIAGNOSIS — I2111 ST elevation (STEMI) myocardial infarction involving right coronary artery: Secondary | ICD-10-CM | POA: Diagnosis not present

## 2023-11-10 LAB — CBC
HCT: 32.2 % — ABNORMAL LOW (ref 39.0–52.0)
Hemoglobin: 10.8 g/dL — ABNORMAL LOW (ref 13.0–17.0)
MCH: 28.6 pg (ref 26.0–34.0)
MCHC: 33.5 g/dL (ref 30.0–36.0)
MCV: 85.4 fL (ref 80.0–100.0)
Platelets: 123 10*3/uL — ABNORMAL LOW (ref 150–400)
RBC: 3.77 MIL/uL — ABNORMAL LOW (ref 4.22–5.81)
RDW: 13.5 % (ref 11.5–15.5)
WBC: 3.9 10*3/uL — ABNORMAL LOW (ref 4.0–10.5)
nRBC: 0 % (ref 0.0–0.2)

## 2023-11-10 LAB — BASIC METABOLIC PANEL WITH GFR
Anion gap: 7 (ref 5–15)
BUN: 25 mg/dL — ABNORMAL HIGH (ref 8–23)
CO2: 24 mmol/L (ref 22–32)
Calcium: 8.5 mg/dL — ABNORMAL LOW (ref 8.9–10.3)
Chloride: 103 mmol/L (ref 98–111)
Creatinine, Ser: 2.63 mg/dL — ABNORMAL HIGH (ref 0.61–1.24)
GFR, Estimated: 26 mL/min — ABNORMAL LOW (ref >60)
Glucose, Bld: 153 mg/dL — ABNORMAL HIGH (ref 70–99)
Potassium: 4.6 mmol/L (ref 3.5–5.1)
Sodium: 134 mmol/L — ABNORMAL LOW (ref 135–145)

## 2023-11-10 LAB — MAGNESIUM: Magnesium: 2.1 mg/dL (ref 1.7–2.4)

## 2023-11-10 MED ORDER — POLYETHYLENE GLYCOL 3350 17 G PO PACK
17.0000 g | PACK | Freq: Two times a day (BID) | ORAL | Status: DC
  Administered 2023-11-10: 17 g via ORAL
  Filled 2023-11-10 (×3): qty 1

## 2023-11-10 MED ORDER — MEXILETINE HCL 250 MG PO CAPS
250.0000 mg | ORAL_CAPSULE | Freq: Two times a day (BID) | ORAL | Status: DC
  Administered 2023-11-10 – 2023-11-11 (×3): 250 mg via ORAL
  Filled 2023-11-10 (×3): qty 1

## 2023-11-10 MED ORDER — SENNOSIDES-DOCUSATE SODIUM 8.6-50 MG PO TABS
2.0000 | ORAL_TABLET | Freq: Every day | ORAL | Status: DC
  Administered 2023-11-10: 2 via ORAL
  Filled 2023-11-10: qty 2

## 2023-11-10 NOTE — Progress Notes (Signed)
 Pt was educated on STEMI, stent card, stent location, Antiplatelet and ASA use, wt restrictions, no baths/daily wash-ups, s/s of infection, ex guidelines, s/s to stop exercising, NTG use and calling 911, heart healthy diet, risk factors and CRPII. Pt received MI book and materials on exercise, diet, and CRPII. Will refer to [facility].   Garen FORBES Candy MS, ACSM-CEP 11/10/2023 2:22 PM

## 2023-11-10 NOTE — Plan of Care (Signed)

## 2023-11-10 NOTE — Progress Notes (Addendum)
 Rounding Note   Patient Name: Randall Frazier Date of Encounter: 11/10/2023  [facility] Cardiologist: [facility]  Subjective  Feels well, no CP, SOB, hopes to go home soon    Scheduled Meds:  aspirin  EC  81 mg Oral Daily   Chlorhexidine  Gluconate Cloth  6 each Topical Daily   doxycycline  100 mg Oral QHS   enoxaparin (LOVENOX) injection  40 mg Subcutaneous Q24H   ezetimibe  10 mg Oral Daily   hydrocortisone  10 mg Oral Daily   And   hydrocortisone  5 mg Oral QHS   icosapent Ethyl  2 g Oral BID   Isavuconazonium Sulfate  372 mg Oral Daily   metoprolol  succinate  25 mg Oral Daily   mexiletine  250 mg Oral Q12H   mycophenolate  250 mg Oral BID   pantoprazole   40 mg Oral Daily   sertraline  100 mg Oral Daily   Sirolimus  1 mg Oral Daily   ticagrelor  90 mg Oral BID   Continuous Infusions:  PRN Meds: acetaminophen , nitroGLYCERIN, ondansetron  (ZOFRAN ) IV, polyethylene glycol   Vital Signs  Vitals:   11/10/23 0400 11/10/23 0500 11/10/23 0600 11/10/23 0700  BP: 108/76 114/64 107/81 92/73  Pulse: (!) 58 (!) 59 (!) 57 60  Resp: 14 (!) 23 20 17   Temp: 98.4 F (36.9 C)   (!) 97.4 F (36.3 C)  TempSrc: Oral   Axillary  SpO2: 95% 91% 95% 94%  Weight:      Height:        Intake/Output Summary (Last 24 hours) at 11/10/2023 0816 Last data filed at 11/10/2023 0700 Gross per 24 hour  Intake 456.82 ml  Output 1200 ml  Net -743.18 ml      11/07/2023   10:00 AM 07/12/2021   11:11 AM 03/23/2021    1:06 PM  Last 3 Weights  Weight (lbs) 216 lb 215 lb 7 oz 281 lb  Weight (kg) 97.977 kg 97.722 kg 127.461 kg      Telemetry SR 60's, no VT, no bradycardia or heart block - Personally Reviewed  ECG  No new EKGs - Personally Reviewed  Physical Exam  GEN: No acute distress.   Neck: No JVD Cardiac: RRR, no murmurs, rubs, or gallops.  Respiratory: CTA b/l. GI: Soft, nontender, non-distended  MS: No edema; No deformity. Neuro:  Nonfocal  Psych: Normal affect   Labs High  Sensitivity Troponin:   Recent Labs  Lab 11/07/23 0958 11/07/23 1333  TROPONINIHS 8 >24,000*     Chemistry Recent Labs  Lab 11/07/23 0958 11/07/23 1006 11/08/23 0346 11/08/23 0930 11/09/23 0324 11/10/23 0423  NA 137   < > 137  --  133* 134*  K 3.9   < > 4.7  --  3.9 4.6  CL 104   < > 104  --  102 103  CO2 19*  --  20*  --  22 24  GLUCOSE 218*   < > 182*  --  189* 153*  BUN 24*   < > 23  --  25* 25*  CREATININE 2.35*   < > 2.45*  --  2.41* 2.63*  CALCIUM  9.1  --  8.4*  --  8.0* 8.5*  MG  --   --   --  1.7 2.3 2.1  PROT 6.3*  --   --   --   --   --   ALBUMIN  3.7  --   --   --   --   --  AST 18  --   --   --   --   --   ALT 20  --   --   --   --   --   ALKPHOS 122  --   --   --   --   --   BILITOT 0.3  --   --   --   --   --   GFRNONAA 29*   < > 28*  --  29* 26*  ANIONGAP 14  --  13  --  9 7   < > = values in this interval not displayed.    Lipids  Recent Labs  Lab 11/08/23 0346  CHOL 260*  TRIG 522*  HDL 24*  LDLCALC UNABLE TO CALCULATE IF TRIGLYCERIDE OVER 400 mg/dL  CHOLHDL 89.1    Hematology Recent Labs  Lab 11/08/23 0346 11/09/23 0324 11/10/23 0423  WBC 4.3 4.5 3.9*  RBC 3.54* 3.52* 3.77*  HGB 10.1* 10.3* 10.8*  HCT 30.4* 30.5* 32.2*  MCV 85.9 86.6 85.4  MCH 28.5 29.3 28.6  MCHC 33.2 33.8 33.5  RDW 13.6 13.6 13.5  PLT 117* 118* 123*   Thyroid No results for input(s): TSH, FREET4 in the last 168 hours.  BNPNo results for input(s): BNP, PROBNP in the last 168 hours.  DDimer No results for input(s): DDIMER in the last 168 hours.   Radiology    Cardiac Studies  Cath/PCI 11/07/23 Distal RCA 100% thrombotic occlusion at baseline, treated with primary PCI necessitating a guide extension catheter, with 0% residual stenosis and TIMI-3 flow after stenting with a 3.0 x 20 mm Synergy DES  2.  Severe distal left main stenosis 3.  Total occlusion of the proximal LAD with patent LIMA to LAD graft 4.  Severe proximal and mid circumflex stenoses  with a patent saphenous vein graft to ramus intermedius/OM1 5.  Complete heart block complicating acute MI, necessitating temporary venous pacing but resolving after reperfusion 6.  Normal LVEDP 12 mmHg, serial lactate 1.8--->1.5   11/08/23: TTE 1. There is no left ventricular thrombus (definity contrast was used).  Left ventricular ejection fraction, by estimation, is 40 to 45%. The left  ventricle has mildly decreased function. The left ventricle demonstrates  regional wall motion abnormalities  (see scoring diagram/findings for description). Left ventricular diastolic  parameters are consistent with Grade I diastolic dysfunction (impaired  relaxation). There is severe hypokinesis of the left ventricular,  mid-apical inferoseptal wall, inferior  wall and apical segment.   2. Right ventricular systolic function is moderately reduced. The right  ventricular size is moderately enlarged. The estimated right ventricular  systolic pressure is 16.1 mmHg.   3. Left atrial size was mildly dilated.   4. The mitral valve is normal in structure. Moderate mitral valve  regurgitation. No evidence of mitral stenosis.   5. The aortic valve is grossly normal. Aortic valve regurgitation is not  visualized. No aortic stenosis is present.   6. Evidence of atrial level shunting detected by color flow Doppler.   Comparison(s): Prior images reviewed side by side. Changes from prior  study are noted. The left ventricular function is worsened. The left  ventricular wall motion new. There is interval reduction in left  ventricular function due to infarction in the  distribution of the right coronary artery.   Patient Profile   68 y.o. male w/PMHx of  liver transplant (in 2022 at [facility] on immunosupression), autoimmune disease, and chronic fungal sinusitis  DM, gout, HTN, CKD (  IIIb) CAD (CABG ~ 8 years ago)   Admitted with IWSTEMI  Assessment & Plan   VT Slow and hemodynamically tolerated withing 48  hours of his STEMI/PCI No recurrent VT on lidocaine  Tolerating low dose BB without bradycardia/ AV block  Not felt to have indication for ICD at this junctuire he is high risk given  -- immunosuppression -- inability to stop DAPT -- chronic fungal sinusitis (s/p bone grafting)  Transition off lidocaine  gtt > mexiletine today 250mg  BID Follow tele  Dr. Inocencio has been bedside, d/w the patient plan   For questions or updates, please contact [facility] Please consult [email or web address] for contact info under     Signed, Charlies Macario Arthur, PA-C  11/10/2023, 8:16 AM    I have seen and examined this patient with Charlies Arthur.  Agree with above, note added to reflect my findings.  No further VT overnight.  Tolerating lidocaine  and metoprolol .  No evidence of heart block.  No acute complaints.  GEN: No acute distress.   Neck: No JVD Cardiac: RRR, no murmurs, rubs, or gallops.  Respiratory: normal BS bases bilaterally. GI: Soft, nontender, non-distended  MS: No edema; No deformity. Neuro:  Nonfocal  Skin: warm and dry Psych: Normal affect    Ventricular tachycardia: Has had no further episodes on IV lidocaine .  Shemica Meath stop lidocaine  today and plan for mexiletine 250 mg twice daily.  He has also been started on metoprolol  without evidence of heart block.  He does not require ICD therapy as his VT was within 48 hours of his MI.  Attempting to avoid amiodarone as he has a history of liver transplant. Coronary artery disease post inferior STEMI: Post DES to the RCA.  Significant Q waves on EKG.  Plan per primary team. Acute systolic heart failure: Ejection fraction 40%.  Suspect that his ejection fraction is actually lower.  Planning for cardiac MRI.  Medical management per heart failure cardiology.  EP to sign off.  Please call back if he has any further VT.  Braylyn Eye M. Shawne Bulow MD 11/10/2023 5:03 PM

## 2023-11-10 NOTE — Plan of Care (Signed)
  Problem: Education: Goal: Knowledge of General Education information will improve Description: Including pain rating scale, medication(s)/side effects and non-pharmacologic comfort measures Outcome: Progressing   Problem: Health Behavior/Discharge Planning: Goal: Ability to manage health-related needs will improve Outcome: Progressing   Problem: Clinical Measurements: Goal: Ability to maintain clinical measurements within normal limits will improve Outcome: Progressing Goal: Will remain free from infection Outcome: Progressing Goal: Diagnostic test results will improve Outcome: Progressing Goal: Respiratory complications will improve Outcome: Progressing Goal: Cardiovascular complication will be avoided Outcome: Progressing   Problem: Activity: Goal: Risk for activity intolerance will decrease Outcome: Progressing   Problem: Nutrition: Goal: Adequate nutrition will be maintained Outcome: Progressing   Problem: Coping: Goal: Level of anxiety will decrease Outcome: Progressing   Problem: Elimination: Goal: Will not experience complications related to bowel motility Outcome: Progressing Goal: Will not experience complications related to urinary retention Outcome: Progressing   Problem: Pain Managment: Goal: General experience of comfort will improve and/or be controlled Outcome: Progressing   Problem: Safety: Goal: Ability to remain free from injury will improve Outcome: Progressing   Problem: Skin Integrity: Goal: Risk for impaired skin integrity will decrease Outcome: Progressing   Problem: Education: Goal: Understanding of cardiac disease, CV risk reduction, and recovery process will improve Outcome: Progressing Goal: Individualized Educational Video(s) Outcome: Progressing   Problem: Activity: Goal: Ability to tolerate increased activity will improve Outcome: Progressing   Problem: Cardiac: Goal: Ability to achieve and maintain adequate cardiovascular  perfusion will improve Outcome: Progressing   Problem: Health Behavior/Discharge Planning: Goal: Ability to safely manage health-related needs after discharge will improve Outcome: Progressing   Problem: Activity: Goal: Ability to return to baseline activity level will improve Outcome: Progressing   Problem: Cardiovascular: Goal: Ability to achieve and maintain adequate cardiovascular perfusion will improve Outcome: Progressing Goal: Vascular access site(s) Level 0-1 will be maintained Outcome: Progressing   Problem: Health Behavior/Discharge Planning: Goal: Ability to safely manage health-related needs after discharge will improve Outcome: Progressing

## 2023-11-10 NOTE — Progress Notes (Addendum)
 Advanced Heart Failure Rounding Note  Cardiologist: None  Chief Complaint: Chest pain Subjective:   Bedside TTE 6/29 with LVEF 40%; appears to have inferoseptal/apical hypokinesis.   No further VT. Remains on lidocaine  gtt @ 1. Level stable yesterday.   Feels great this morning. Wants to be home by the 4th of July so that he can be home with his dogs who don't do well with fireworks. Denies CP/SOB. Has been walking around.   Objective:   Weight Range: 98 kg Body mass index is 29.29 kg/m.   Vital Signs:   Temp:  [97.4 F (36.3 C)-98.5 F (36.9 C)] 97.4 F (36.3 C) (07/01 0700) Pulse Rate:  [57-62] 60 (07/01 0700) Resp:  [12-28] 17 (07/01 0700) BP: (92-124)/(59-88) 92/73 (07/01 0700) SpO2:  [90 %-98 %] 94 % (07/01 0700) Last BM Date : 11/06/23  Weight change: Filed Weights   11/07/23 1000  Weight: 98 kg   Intake/Output   Intake/Output Summary (Last 24 hours) at 11/10/2023 0813 Last data filed at 11/10/2023 0700 Gross per 24 hour  Intake 456.82 ml  Output 1200 ml  Net -743.18 ml    Physical Exam  General:  well / elderly appearing.  No respiratory difficulty Neck: supple. JVD flat.  Cor: PMI nondisplaced. Regular rate & rhythm. No rubs, gallops or murmurs. Lungs: clear Extremities: no cyanosis, clubbing, rash, edema  Neuro: alert & oriented x 3. Moves all 4 extremities w/o difficulty. Affect pleasant.   Telemetry   NSR 60s (Personally reviewed)   EKG    No new EKG to review  Labs    CBC Recent Labs    11/07/23 0958 11/07/23 1006 11/09/23 0324 11/10/23 0423  WBC 2.7*   < > 4.5 3.9*  NEUTROABS 1.6*  --   --   --   HGB 11.4*   < > 10.3* 10.8*  HCT 34.0*   < > 30.5* 32.2*  MCV 84.8   < > 86.6 85.4  PLT 110*   < > 118* 123*   < > = values in this interval not displayed.   Basic Metabolic Panel Recent Labs    93/69/74 0324 11/10/23 0423  NA 133* 134*  K 3.9 4.6  CL 102 103  CO2 22 24  GLUCOSE 189* 153*  BUN 25* 25*  CREATININE 2.41* 2.63*   CALCIUM  8.0* 8.5*  MG 2.3 2.1   Liver Function Tests Recent Labs    11/07/23 0958  AST 18  ALT 20  ALKPHOS 122  BILITOT 0.3  PROT 6.3*  ALBUMIN  3.7   No results for input(s): LIPASE, AMYLASE in the last 72 hours. Cardiac Enzymes No results for input(s): CKTOTAL, CKMB, CKMBINDEX, TROPONINI in the last 72 hours.  BNP: BNP (last 3 results) No results for input(s): BNP in the last 8760 hours.  ProBNP (last 3 results) No results for input(s): PROBNP in the last 8760 hours.   D-Dimer No results for input(s): DDIMER in the last 72 hours. Hemoglobin A1C Recent Labs    11/08/23 0346  HGBA1C 7.9*   Fasting Lipid Panel Recent Labs    11/08/23 0346  CHOL 260*  HDL 24*  LDLCALC UNABLE TO CALCULATE IF TRIGLYCERIDE OVER 400 mg/dL  TRIG 477*  CHOLHDL 89.1  LDLDIRECT 155*   Thyroid Function Tests No results for input(s): TSH, T4TOTAL, T3FREE, THYROIDAB in the last 72 hours.  Invalid input(s): FREET3  Other results:   Imaging    No results found.    Medications:  Scheduled Medications:  aspirin  EC  81 mg Oral Daily   Chlorhexidine  Gluconate Cloth  6 each Topical Daily   doxycycline  100 mg Oral QHS   enoxaparin (LOVENOX) injection  40 mg Subcutaneous Q24H   ezetimibe  10 mg Oral Daily   hydrocortisone  10 mg Oral Daily   And   hydrocortisone  5 mg Oral QHS   icosapent Ethyl  2 g Oral BID   Isavuconazonium Sulfate  372 mg Oral Daily   metoprolol  succinate  25 mg Oral Daily   mycophenolate  250 mg Oral BID   pantoprazole   40 mg Oral Daily   sertraline  100 mg Oral Daily   Sirolimus  1 mg Oral Daily   ticagrelor  90 mg Oral BID    Infusions:  lidocaine  1 mg/min (11/10/23 0700)    PRN Medications: acetaminophen , nitroGLYCERIN, ondansetron  (ZOFRAN ) IV, polyethylene glycol  Patient Profile   Randall Frazier is a 68 y.o. male with the complicated medical history who is being seen 11/08/2023 for the evaluation of inferior  STEMI.   Assessment/Plan  STEMI involving the RCA:  - Complex PCI by Dr. Wonda with excellent results.  - Bedside TTE with LVEF ~40%; appears to have some inferoseptal/apical hypokinesis.  - Echo 6/29: EF 40-45%, LV with RWMA, GIDD, severe HK of LV mid-apical inferoseptal wall, inferior wall and apical segment. RV mod reduced. LA mildly dilated Mod MR.  - Telemetry with NSR.  - Continue Brilinta / ASA.  - CHB 6/28 briefly requiring temp pacer, now removed.  - Continue toprol  XL 25 mg daily  Liver transplant - S/P liver transplant in 2022 at [facility] - Continue cellcept 250mg  BID - Hydrocortisone 10mg  - 5mg   - Rapamune 1mg   - lsavuconazonium sulfate 372.   Chronic kidney disease stage IIIb with creatinine 2.5.   - sCr 2.63 today; no med changes.   Lipids - Patient with hypercholesterolemia, total cholesterol 284, triglycerides 708, unable to calculate LDL.  Patient statin intolerant.   - Started on Zetia and Vascepa here, plan for Repatha as outpatient.  - Will need referral to lipid clinic at f/u.   WCT/VT - Patient with VT early yesterday am. Converted with amiodarone bolus + lidocaine  gtt. Patient asymptomatic during event.  - EP following - Plan to stop lidocaine  gtt today and transition to mexiletine per EP - Continue BB - Keep K>4 and Mg >2 - If there is a reoccurrence can consider cMRI  Plan to transition to the floor today with possible discharge tomorrow.   CRITICAL CARE Performed by: Beckey LITTIE Coe  Total critical care time: 11 minutes  Critical care time was exclusive of separately billable procedures and treating other patients.  Critical care was necessary to treat or prevent imminent or life-threatening deterioration.  Critical care was time spent personally by me on the following activities: development of treatment plan with patient and/or surrogate as well as nursing, discussions with consultants, evaluation of patient's response to treatment, examination of  patient, obtaining history from patient or surrogate, ordering and performing treatments and interventions, ordering and review of laboratory studies, ordering and review of radiographic studies, pulse oximetry and re-evaluation of patient's condition.   Length of Stay: 3  Beckey LITTIE Coe, NP  11/10/2023, 8:13 AM  Advanced Heart Failure Team Pager [phone] (M-F; 7a - 5p)  Please contact [facility] for night-coverage after hours (5p -7a ) and weekends on [email or web address]   Patient seen with NP, I formulated the plan and  agree with the above note.   No further VT, remains on lidocaine  1.  Creatinine 2.4 => 2.6.  No chest pain or dyspnea.   General: NAD Neck: No JVD, no thyromegaly or thyroid nodule.  Lungs: Clear to auscultation bilaterally with normal respiratory effort. CV: Nondisplaced PMI.  Heart regular S1/S2, no S3/S4, no murmur.  No peripheral edema.   Abdomen: Soft, nontender, no hepatosplenomegaly, no distention.  Skin: Intact without lesions or rashes.  Neurologic: Alert and oriented x 3.  Psych: Normal affect. Extremities: No clubbing or cyanosis.  HEENT: Normal.   Post-MI echo with EF 40-45%, inferoseptal/inferior severe HK, moderate MR, possible PFO.  Not volume overloaded on exam.  GDMT limited by CKD stage 3, creatinine 2.41 => 2.6 today.  - Continue Toprol  XL 25 mg daily.  - No other GDMT for now.     Patient had transient CHB peri-MI, resolved with reperfusion.  No bradycardia with addition of Toprol  XL.  Continue to follow telemetry.    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  Occurred within 48 hrs of acute MI.  - Transition from lidocaine  to mexiletine 150 mg bid today.  - If no further VT, would hold off on ICD for now and repeat echo at 3 months to determine ICD need (post-PCI).  Discussed with EP, do not recommend Lifevest.  - Will get cMRI in a couple weeks as outpatient for scar burden.  - Continue Toprol  XL 25 mg daily.    Creatinine 2.4 => 2.6, this is around his  baseline.    Post-inferior MI, s/p DES distal RCA.  Has residual disease mid-distal LCx.  - Continue ASA/ticagrelor.  - Cannot take statins.  TGs high.  Have started Zetia and Vascepa here, aim for Repatha as outpatient.  - Markedly elevated Lp(a), as above will need lipid clinic referral to get started on Repatha as outpatient.    Can stop doxycycline at this point, was on for tick bite prophylaxis.    H/o fungal sinusitis, continuing outpatient isavuconazonium.    H/o liver transplant, continue home immunosuppressants.    Can return to telemetry, back to Team C.  If no further VT possibly home tomorrow.   Ezra Shuck 11/10/2023 9:08 AM

## 2023-11-11 ENCOUNTER — Encounter: Payer: Self-pay | Admitting: Oncology

## 2023-11-11 ENCOUNTER — Other Ambulatory Visit (HOSPITAL_COMMUNITY): Payer: Self-pay

## 2023-11-11 ENCOUNTER — Telehealth: Payer: Self-pay | Admitting: Cardiology

## 2023-11-11 DIAGNOSIS — I5021 Acute systolic (congestive) heart failure: Secondary | ICD-10-CM | POA: Insufficient documentation

## 2023-11-11 DIAGNOSIS — I472 Ventricular tachycardia, unspecified: Secondary | ICD-10-CM | POA: Insufficient documentation

## 2023-11-11 DIAGNOSIS — I2111 ST elevation (STEMI) myocardial infarction involving right coronary artery: Secondary | ICD-10-CM | POA: Diagnosis not present

## 2023-11-11 LAB — BASIC METABOLIC PANEL WITH GFR
Anion gap: 11 (ref 5–15)
BUN: 27 mg/dL — ABNORMAL HIGH (ref 8–23)
CO2: 22 mmol/L (ref 22–32)
Calcium: 8.6 mg/dL — ABNORMAL LOW (ref 8.9–10.3)
Chloride: 99 mmol/L (ref 98–111)
Creatinine, Ser: 2.72 mg/dL — ABNORMAL HIGH (ref 0.61–1.24)
GFR, Estimated: 25 mL/min — ABNORMAL LOW (ref >60)
Glucose, Bld: 133 mg/dL — ABNORMAL HIGH (ref 70–99)
Potassium: 4.6 mmol/L (ref 3.5–5.1)
Sodium: 132 mmol/L — ABNORMAL LOW (ref 135–145)

## 2023-11-11 LAB — MAGNESIUM: Magnesium: 2.1 mg/dL (ref 1.7–2.4)

## 2023-11-11 LAB — CBC
HCT: 32.4 % — ABNORMAL LOW (ref 39.0–52.0)
Hemoglobin: 10.8 g/dL — ABNORMAL LOW (ref 13.0–17.0)
MCH: 28.5 pg (ref 26.0–34.0)
MCHC: 33.3 g/dL (ref 30.0–36.0)
MCV: 85.5 fL (ref 80.0–100.0)
Platelets: 131 10*3/uL — ABNORMAL LOW (ref 150–400)
RBC: 3.79 MIL/uL — ABNORMAL LOW (ref 4.22–5.81)
RDW: 13.4 % (ref 11.5–15.5)
WBC: 3.8 10*3/uL — ABNORMAL LOW (ref 4.0–10.5)
nRBC: 0 % (ref 0.0–0.2)

## 2023-11-11 MED ORDER — EZETIMIBE 10 MG PO TABS
10.0000 mg | ORAL_TABLET | Freq: Every day | ORAL | 0 refills | Status: DC
  Filled 2023-11-11: qty 30, 30d supply, fill #0

## 2023-11-11 MED ORDER — METOPROLOL SUCCINATE ER 25 MG PO TB24
25.0000 mg | ORAL_TABLET | Freq: Every day | ORAL | 0 refills | Status: DC
Start: 2023-11-11 — End: 2023-11-11
  Filled 2023-11-11: qty 30, 30d supply, fill #0

## 2023-11-11 MED ORDER — EZETIMIBE 10 MG PO TABS: 10.0000 mg | ORAL_TABLET | Freq: Every day | ORAL | 0 refills | Status: DC

## 2023-11-11 MED ORDER — METOPROLOL SUCCINATE ER 25 MG PO TB24: 25.0000 mg | ORAL_TABLET | Freq: Every day | ORAL | 0 refills | Status: DC

## 2023-11-11 MED ORDER — MEXILETINE HCL 250 MG PO CAPS: 250.0000 mg | ORAL_CAPSULE | Freq: Two times a day (BID) | ORAL | 0 refills | Status: DC

## 2023-11-11 MED ORDER — NITROGLYCERIN 0.4 MG SL SUBL: 0.4000 mg | SUBLINGUAL_TABLET | SUBLINGUAL | 0 refills | Status: AC | PRN

## 2023-11-11 MED ORDER — TICAGRELOR 90 MG PO TABS
90.0000 mg | ORAL_TABLET | Freq: Two times a day (BID) | ORAL | 0 refills | Status: DC
  Filled 2023-11-11 (×2): qty 60, 30d supply, fill #0

## 2023-11-11 MED ORDER — ICOSAPENT ETHYL 1 G PO CAPS: 2.0000 g | ORAL_CAPSULE | Freq: Two times a day (BID) | ORAL | 0 refills | Status: DC

## 2023-11-11 MED ORDER — ICOSAPENT ETHYL 1 G PO CAPS
2.0000 g | ORAL_CAPSULE | Freq: Two times a day (BID) | ORAL | 0 refills | Status: DC
  Filled 2023-11-11: qty 120, 30d supply, fill #0

## 2023-11-11 MED ORDER — NITROGLYCERIN 0.4 MG SL SUBL
0.4000 mg | SUBLINGUAL_TABLET | SUBLINGUAL | 0 refills | Status: DC | PRN
  Filled 2023-11-11: qty 25, 5d supply, fill #0

## 2023-11-11 MED ORDER — TICAGRELOR 90 MG PO TABS: 90.0000 mg | ORAL_TABLET | Freq: Two times a day (BID) | ORAL | 0 refills | Status: DC

## 2023-11-11 MED ORDER — MEXILETINE HCL 250 MG PO CAPS
250.0000 mg | ORAL_CAPSULE | Freq: Two times a day (BID) | ORAL | 0 refills | Status: DC
Start: 2023-11-11 — End: 2023-11-11
  Filled 2023-11-11: qty 60, 30d supply, fill #0

## 2023-11-11 NOTE — Discharge Summary (Addendum)
 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  Acute STEMI involving the RCA complicated by complete heart block and hypotension.  Distal RCA 100% thrombotic occlusion at baseline, treated with primary PCI necessitating a guide extension catheter, with 0% residual stenosis and TIMI-3 flow after stenting with a 3.0 x 20 mm Synergy DES 2.  Severe distal left main stenosis 3.  Total occlusion of the proximal LAD with patent LIMA to LAD graft 4.  Severe proximal and mid circumflex stenoses with a patent saphenous vein graft to ramus intermedius/OM1 5.  Complete heart block complicating acute MI, necessitating temporary venous pacing but resolving after reperfusion 6.  Normal LVEDP 12 mmHg, serial lactate 1.8--->1.5   Recommendations: Gentle fluid hydration to minimize risk of AKI on background of chronic kidney disease (creatinine 2.5 today).  DAPT with aspirin  and ticagrelor 12 months without interruption.  Check 2D echo for assessment of LV function.  Favor medical therapy for residual CAD in the context of the patient's comorbid medical problems.  Diagnostic Dominance: Right  Intervention   _____________   History of Present Illness   Randall Frazier is a 68 y.o. male with a history of coronary artery disease status post CABG about 8 years ago. He has a complicated medical  history that includes liver transplant, autoimmune disease, and chronic fungal sinusitis. He was in his normal state of health the morning of admission when he experienced the acute onset of severe substernal chest discomfort and diaphoresis. This was also associated with weakness and shortness of breath. EMS was called and an EKG in the field demonstrated an acute inferior STEMI with marked bradycardia, ventricular rate in the 30s. A code STEMI was called and the patient presented directly to the cardiac catheterization lab. At the time of arrival noted to have a heart rate of about 30 to 33 bpm, appeared pale, and continued to experience chest discomfort.  Hospital Course   Consultants: EP   Inferior STEMI Prior CABG (LIMA-LAD, SVG-OM1) -- Underwent cardiac catheterization with acute distal RCA thrombotic occlusion of 100% treated with PCI/DES x1, in the setting of complete heart block necessitating pacer after reperfusion.  Recommendations for DAPT aspirin /Brilinta for at least one year -- Bedside TTE with LVEF ~40%; appeared to have some inferoseptal/apical hypokinesis.  -- Echo 6/29: EF 40-45%, LV with RWMA, GIDD, severe HK of LV mid-apical inferoseptal wall, inferior wall and apical segment. RV mod reduced. LA mildly dilated Mod MR.  -- continue ASA, Brilinta, Toprol  XL 25mg  daily,    Hx of Liver transplant -- s/p liver transplant in 2022 at [facility] -- Continue cellcept 250mg  BID, Hydrocortisone 10mg  - 5mg , Rapamune 1mg , lsavuconazonium sulfate 372.    Chronic kidney disease stage IIIb  -- baseline Cr 2.5, Cr 2.7 at discharge  HLD -- total cholesterol 284, triglycerides 708, unable to calculate LDL.  Patient statin intolerant.   -- continue on Zetia and Vascepa  that was started this admission with plan for Repatha as outpatient.  -- lipid clinic referral at discharge    Monomorphic VT, suspect scar-mediated -- developed episode this morning of 6/30 which converted with amiodarone bolus +  lidocaine  gtt. Patient asymptomatic during event.  -- seen by EP with recommendations to transition from IV lidocaine  to mexiletine  250mg  BID, likely for 3 months -- continue Toprol  XL -- no plans for ICD for now, planned for repeat echo in 3 months to determine need for ICD placement at that time. No lifevest per EP recommendations at discharge -- outpatient cMRI in a few weeks to assess scar burden  General: Well developed, well nourished, male appearing in no acute distress. Head: Normocephalic, atraumatic.  Neck: Supple without bruits, JVD. Lungs:  Resp regular and unlabored, CTA. Heart: RRR, S1, S2, no S3, S4, or murmur; no rub. Abdomen: Soft, non-tender, non-distended with normoactive bowel sounds.  Extremities: No clubbing, cyanosis, edema. Distal pedal pulses are 2+ bilaterally. Neuro: Alert and oriented X 3. Moves all extremities spontaneously. Psych: Normal affect.  Patient was seen by myself and Dr. Verlin and deemed stable for discharge home. Follow up arranged in the office, [facility] if not needed). Medications sent to the [facility] pharmacy. Educated by pharmD prior to discharge.   Did the patient have an acute coronary syndrome (MI, NSTEMI, STEMI, etc) this admission?:  Yes                               AHA/ACC ACS Clinical Performance & Quality Measures: Aspirin  prescribed? - Yes ADP Receptor Inhibitor (Plavix/Clopidogrel, Brilinta/Ticagrelor or Effient/Prasugrel) prescribed (includes medically managed patients)? - Yes Beta Blocker prescribed? - Yes High Intensity Statin (Lipitor 40-80mg  or Crestor 20-40mg ) prescribed? - No, statin intolerant EF assessed during THIS hospitalization? - Yes For EF <40%, was ACEI/ARB prescribed? - Not Applicable (EF >/= 40%) For EF <40%, Aldosterone Antagonist (Spironolactone  or Eplerenone) prescribed? - Not Applicable (EF >/= 40%) Cardiac Rehab Phase II ordered (including medically managed patients)?  - Yes   The patient will be scheduled for a TOC follow up appointment in 10-14 days.  A message has been sent to the [facility] and Scheduling Pool at the office where the patient should be seen for follow up.  _____________  Discharge Vitals Blood pressure (!) 106/59, pulse (!) 55, temperature 97.8 F (36.6 C), temperature source Oral, resp. rate 20, height 6' (1.829 m), weight 98 kg, SpO2 97%.  Filed Weights   11/07/23 1000  Weight: 98 kg    Labs & Radiologic Studies  CBC Recent Labs    11/10/23 0423 11/11/23 0500  WBC 3.9* 3.8*  HGB 10.8* 10.8*  HCT 32.2* 32.4*  MCV 85.4 85.5  PLT 123* 131*   Basic Metabolic Panel Recent Labs    92/98/74 0423 11/11/23 0500  NA 134* 132*  K 4.6 4.6  CL 103 99  CO2 24 22  GLUCOSE 153* 133*  BUN 25* 27*  CREATININE 2.63* 2.72*  CALCIUM  8.5* 8.6*  MG 2.1 2.1   Liver Function Tests No results for input(s): AST, ALT, ALKPHOS, BILITOT, PROT, ALBUMIN  in the last 72 hours. No results for input(s): LIPASE, AMYLASE in the last 72 hours. High Sensitivity Troponin:   Recent Labs  Lab 11/07/23 0958 11/07/23 1333  TROPONINIHS 8 >24,000*    No results for input(s): TRNPT in the last 720 hours.  BNP Invalid input(s): POCBNP No results for input(s): PROBNP in the last 72 hours.  No results for input(s): BNP in the last 72 hours.  D-Dimer No results for input(s): DDIMER in the last 72 hours. Hemoglobin A1C No results for input(s): HGBA1C in the last 72 hours. Fasting Lipid Panel No results for input(s): CHOL, HDL, LDLCALC, TRIG, CHOLHDL, LDLDIRECT in the last 72 hours. Lipoprotein (a)  Date/Time Value Ref Range Status  11/08/2023 03:46 AM 274.8 (H) <75.0 nmol/L Final    Comment:    (NOTE) This test was developed and its performance characteristics determined by [facility]. It has not been cleared or approved by the Food and Drug Administration. Note:  Values greater than or equal to 75.0 nmol/L  may       indicate an independent risk factor for CHD,       but must be evaluated with caution when applied       to non-Caucasian populations due to the       influence of genetic factors on Lp(a) across       ethnicities. Performed At: [facility] [address] [place], [place] [id number] Jennette Shorter MD Ey:[phone]     Thyroid Function Tests No results for input(s): TSH, T4TOTAL, T3FREE, THYROIDAB in the last 72 hours.  Invalid input(s): FREET3 _____________  ECHOCARDIOGRAM COMPLETE Result Date: 11/08/2023    ECHOCARDIOGRAM REPORT   Patient Name:   Randall Frazier Date of Exam: 11/08/2023 Medical Rec #:  [id number]      Height:       72.0 in Accession #:    [id number]     Weight:       216.0 lb Date of Birth:  14-Sep-1955      BSA:          2.201 m Patient Age:    67 years       BP:           95/51 mmHg Patient Gender: M              HR:           62 bpm. Exam Location:  Inpatient Procedure: 2D Echo, Cardiac Doppler, Color Doppler and Intracardiac            Opacification Agent (Both Spectral and Color Flow Doppler were            utilized during procedure). Indications:    I25.110 Atherosclerotic heart disease of native coronary artery                 with unstable angina pectoris; 122-I22.9 Subsequent ST elevation                 (STEM) and non-ST elevation (NSTEMI) myocardial infarction  History:        Patient has prior history of Echocardiogram examinations, most                 recent 09/14/2019. Previous Myocardial Infarction and CAD,                 Abnormal ECG, Arrythmias:Bradycardia, Signs/Symptoms:Altered                 Mental Status, Shortness of Breath, Dyspnea and Chest Pain; Risk                 Factors:Hypertension. PFO.  Sonographer:    Ellouise Mose RDCS Referring Phys: [phone] MICHAEL COOPER  Sonographer Comments: Technically difficult study due to poor echo windows.  IMPRESSIONS  1. There is no left ventricular thrombus (definity contrast was used). Left  ventricular ejection fraction, by estimation, is 40 to 45%. The left ventricle has mildly decreased function. The left ventricle demonstrates regional wall motion abnormalities (see scoring diagram/findings for description). Left ventricular diastolic parameters are consistent with Grade I diastolic dysfunction (impaired relaxation). There is severe hypokinesis of the left ventricular, mid-apical inferoseptal wall, inferior wall and apical segment.  2. Right ventricular systolic function is moderately reduced. The right ventricular size is moderately enlarged. The estimated right ventricular systolic pressure is 16.1 mmHg.  3. Left atrial size was mildly dilated.  4. The mitral valve is normal in structure. Moderate mitral valve regurgitation. No evidence of mitral stenosis.  5. The aortic valve is grossly normal. Aortic valve regurgitation is not visualized. No aortic stenosis is present.  6. Evidence of atrial level shunting detected by color flow Doppler. Comparison(s): Prior images reviewed side by side. Changes from prior study are noted. The left ventricular function is worsened. The left ventricular wall motion new. There is interval reduction in left ventricular function due to infarction in the distribution of the right coronary artery. Conclusion(s)/Recommendation(s): No left ventricular mural or apical thrombus/thrombi. FINDINGS  Left Ventricle: There is no left ventricular thrombus (definity contrast was used). Left ventricular ejection fraction, by estimation, is 40 to 45%. The left ventricle has mildly decreased function. The left ventricle demonstrates regional wall motion abnormalities. Severe hypokinesis of the left ventricular, mid-apical inferoseptal wall, inferior wall and apical segment. Definity contrast agent was given IV to delineate the left ventricular endocardial borders. The left ventricular internal cavity size was normal in size. There is no left ventricular hypertrophy. Left  ventricular diastolic parameters are consistent with Grade I diastolic dysfunction (impaired relaxation). Normal left ventricular filling pressure.  LV Wall Scoring: The mid and distal inferior wall, mid inferoseptal segment, apical septal segment, and apex are hypokinetic. Right Ventricle: The right ventricular size is moderately enlarged. No increase in right ventricular wall thickness. Right ventricular systolic function is moderately reduced. The tricuspid regurgitant velocity is 1.81 m/s, and with an assumed right atrial pressure of 3 mmHg, the estimated right ventricular systolic pressure is 16.1 mmHg. Left Atrium: Left atrial size was mildly dilated. Right Atrium: Right atrial size was normal in size. Pericardium: There is no evidence of pericardial effusion. Mitral Valve: The mitral valve is normal in structure. Moderate mitral valve regurgitation, with centrally-directed jet. No evidence of mitral valve stenosis. Tricuspid Valve: The tricuspid valve is normal in structure. Tricuspid valve regurgitation is trivial. No evidence of tricuspid stenosis. Aortic Valve: The aortic valve is grossly normal. Aortic valve regurgitation is not visualized. No aortic stenosis is present. Pulmonic Valve: The pulmonic valve was normal in structure. Pulmonic valve regurgitation is not visualized. No evidence of pulmonic stenosis. Aorta: The aortic root and ascending aorta are structurally normal, with no evidence of dilitation. IAS/Shunts: Evidence of atrial level shunting detected by color flow Doppler.  LEFT VENTRICLE PLAX 2D LVIDd:         4.60 cm      Diastology LVIDs:         3.40 cm      LV e' medial:    5.87 cm/s LV PW:         1.10 cm      LV E/e' medial:  12.7 LV IVS:        1.20 cm      LV e' lateral:   11.20 cm/s LVOT  diam:     2.30 cm      LV E/e' lateral: 6.7 LV SV:         85 LV SV Index:   39 LVOT Area:     4.15 cm  LV Volumes (MOD) LV vol d, MOD A2C: 81.2 ml LV vol d, MOD A4C: 113.0 ml LV vol s, MOD A2C:  45.9 ml LV vol s, MOD A4C: 66.6 ml LV SV MOD A2C:     35.3 ml LV SV MOD A4C:     113.0 ml LV SV MOD BP:      41.6 ml RIGHT VENTRICLE            IVC RV S prime:     4.90 cm/s  IVC diam: 1.30 cm TAPSE (M-mode): 0.3 cm LEFT ATRIUM             Index        RIGHT ATRIUM           Index LA diam:        4.10 cm 1.86 cm/m   RA Area:     10.70 cm LA Vol (A2C):   24.2 ml 10.99 ml/m  RA Volume:   21.80 ml  9.90 ml/m LA Vol (A4C):   40.1 ml 18.22 ml/m LA Biplane Vol: 32.6 ml 14.81 ml/m  AORTIC VALVE LVOT Vmax:   96.60 cm/s LVOT Vmean:  61.600 cm/s LVOT VTI:    0.205 m  AORTA Ao Root diam: 3.60 cm Ao Asc diam:  3.60 cm MITRAL VALVE               TRICUSPID VALVE MV Area (PHT): 3.17 cm    TR Peak grad:   13.1 mmHg MV Decel Time: 239 msec    TR Vmax:        181.00 cm/s MV E velocity: 74.50 cm/s MV A velocity: 94.60 cm/s  SHUNTS MV E/A ratio:  0.79        Systemic VTI:  0.20 m                            Systemic Diam: 2.30 cm Jerel Croitoru MD Electronically signed by Jerel Balding MD Signature Date/Time: 11/08/2023/12:16:43 PM    Final    DG Chest Port 1 View Result Date: 11/07/2023 CLINICAL DATA:  stemi EXAM: PORTABLE CHEST - 1 VIEW COMPARISON:  March 23, 2021 FINDINGS: No focal airspace consolidation, pleural effusion, or pneumothorax. Mild cardiomegaly. Sternotomy wires and CABG markers. Tortuous aorta with aortic atherosclerosis. No acute fracture or destructive lesions. Multilevel thoracic osteophytosis. Cervical fusion hardware. Surgical clips along the right lung base. IMPRESSION: No acute cardiopulmonary abnormality. Electronically Signed   By: Rogelia Myers M.D.   On: 11/07/2023 13:13   CARDIAC CATHETERIZATION Result Date: 11/07/2023 1.  Acute STEMI involving the RCA complicated by complete heart block and hypotension.  Distal RCA 100% thrombotic occlusion at baseline, treated with primary PCI necessitating a guide extension catheter, with 0% residual stenosis and TIMI-3 flow after stenting with a 3.0 x  20 mm Synergy DES 2.  Severe distal left main stenosis 3.  Total occlusion of the proximal LAD with patent LIMA to LAD graft 4.  Severe proximal and mid circumflex stenoses with a patent saphenous vein graft to ramus intermedius/OM1 5.  Complete heart block complicating acute MI, necessitating temporary venous pacing but resolving after reperfusion 6.  Normal LVEDP 12 mmHg, serial lactate 1.8--->1.5 Recommendations: Gentle fluid  hydration to minimize risk of AKI on background of chronic kidney disease (creatinine 2.5 today).  DAPT with aspirin  and ticagrelor 12 months without interruption.  Check 2D echo for assessment of LV function.  Favor medical therapy for residual CAD in the context of the patient's comorbid medical problems.    Disposition Pt is being discharged home today in good condition.  Follow-up Plans & Appointments  Discharge Instructions     AMB Referral to Advanced Lipid Disorders Clinic   Complete by: As directed    Needs PCSK9   Internal Lipid Clinic Referral Scheduling  Internal lipid clinic referrals are providers within [facility], who wish to refer established patients for routine management (help in starting PCSK9 inhibitor therapy) or advanced therapies.  Internal MD referral criteria:              1. All patients with LDL>190 mg/dL  2. All patients with Triglycerides >500 mg/dL  3. Patients with suspected or confirmed heterozygous familial hyperlipidemia (HeFH) or homozygous familial hyperlipidemia (HoFH)  4. Patients with family history of suspicious for genetic dyslipidemia desiring genetic testing  5. Patients refractory to standard guideline based therapy  6. Patients with statin intolerance (failed 2 statins, one of which must be a high potency statin)  7. Patients who the provider desires to be seen by MD   Internal PharmD referral criteria:   1. Follow-up patients for medication management  2. Follow-up for compliance monitoring  3. Patients for drug  education  4. Patients with statin intolerance  5. PCSK9 inhibitor education and prior authorization approvals  6. Patients with triglycerides <500 mg/dL  External Lipid Clinic Referral  External lipid clinic referrals are for providers outside of [facility], considered new clinic patients - automatically routed to MD schedule   AMB Referral to [facility] Pharm-D   Complete by: As directed    Statin intolerant; zetia + vascepa added during hospitalization   Reason For Referral: Lipids Comment - post ACS discharge- to be seen within 2 weeks   Amb Referral to Cardiac Rehabilitation   Complete by: As directed    Diagnosis:  STEMI Coronary Stents     After initial evaluation and assessments completed: Virtual Based Care may be provided alone or in conjunction with Phase 2 Cardiac Rehab based on patient barriers.: Yes   Intensive Cardiac Rehabilitation (ICR) [facility] location only OR Traditional Cardiac Rehabilitation (TCR) *If criteria for ICR are not met will enroll in TCR [facility] only): Yes   Call MD for:  difficulty breathing, headache or visual disturbances   Complete by: As directed    Call MD for:  persistant dizziness or light-headedness   Complete by: As directed    Call MD for:  redness, tenderness, or signs of infection (pain, swelling, redness, odor or green/yellow discharge around incision site)   Complete by: As directed    Diet - low sodium heart healthy   Complete by: As directed    Discharge instructions   Complete by: As directed    Groin Site Care Refer to this sheet in the next few weeks. These instructions provide you with information on caring for yourself after your procedure. Your caregiver may also give you more specific instructions. Your treatment has been planned according to current medical practices, but problems sometimes occur. Call your caregiver if you have any problems or questions after your procedure. HOME CARE INSTRUCTIONS You may shower 24 hours after the  procedure. Remove the bandage (dressing) and gently wash the site with  plain soap and water. Gently pat the site dry.  Do not apply powder or lotion to the site.  Do not sit in a bathtub, swimming pool, or whirlpool for 5 to 7 days.  No bending, squatting, or lifting anything over 10 pounds (4.5 kg) as directed by your caregiver.  Inspect the site at least twice daily.  Do not drive home if you are discharged the same day of the procedure. Have someone else drive you.  You may drive 24 hours after the procedure unless otherwise instructed by your caregiver.  What to expect: Any bruising will usually fade within 1 to 2 weeks.  Blood that collects in the tissue (hematoma) may be painful to the touch. It should usually decrease in size and tenderness within 1 to 2 weeks.  SEEK IMMEDIATE MEDICAL CARE IF: You have unusual pain at the groin site or down the affected leg.  You have redness, warmth, swelling, or pain at the groin site.  You have drainage (other than a small amount of blood on the dressing).  You have chills.  You have a fever or persistent symptoms for more than 72 hours.  You have a fever and your symptoms suddenly get worse.  Your leg becomes pale, cool, tingly, or numb.  You have heavy bleeding from the site. Hold pressure on the site. SABRA  PLEASE DO NOT MISS ANY DOSES OF YOUR BRILINTA!!!!! Also keep a log of you blood pressures and bring back to your follow up appt. Please call the office with any questions.   Patients taking blood thinners should generally stay away from medicines like ibuprofen, Advil, Motrin, naproxen, and Aleve due to risk of stomach bleeding. You may take Tylenol  as directed or talk to your primary doctor about alternatives.   PLEASE ENSURE THAT YOU DO NOT RUN OUT OF YOUR BRILINTA. This medication is very important to remain on for at least one year. IF you have issues obtaining this medication due to cost please CALL the office 3-5 business days prior to  running out in order to prevent missing doses of this medication.   Increase activity slowly   Complete by: As directed        Discharge Medications Allergies as of 11/11/2023       Reactions   Bromsite [bromfenac Sodium] Other (See Comments)   Kidney disorder; serum creatinine raised.   Desyrel  [trazodone ] Nausea Only   Lotensin [benazepril] Cough   Mobic [meloxicam] Other (See Comments)   Hx liver transplant   Neurontin [gabapentin] Swelling   Nsaids Other (See Comments)   Kidney Disorder; serum creatinine above reference range   Pork Allergy Other (See Comments)   Gout flares   Pravachol [pravastatin] Other (See Comments)   Liver enzymes abnormal; outside reference range Myalgias   Statins Other (See Comments)   Liver enzymes abnormal; outside reference range  Myalgias Tried on Pravachol and Zocor   Toradol  [ketorolac  Tromethamine ] Other (See Comments)   Kidney disorder; serum creatinine above reference range   Zocor [simvastatin] Other (See Comments)   Liver enzymes abnormal; outside reference range Myalgias   Niaspan [niacin] Other (See Comments)   Vasomotor symptoms   Zestril [lisinopril] Swelling, Dermatitis, Rash, Other (See Comments)        Medication List     STOP taking these medications    doxycycline 100 MG tablet Commonly known as: ADOXA       TAKE these medications    aspirin  81 MG chewable tablet Chew 81 mg  by mouth daily.   Cresemba 186 MG Caps Generic drug: Isavuconazonium Sulfate Take 372 mg by mouth daily.   ezetimibe 10 MG tablet Commonly known as: ZETIA Take 1 tablet (10 mg total) by mouth daily.   hydrocortisone 10 MG tablet Commonly known as: CORTEF Take 5-10 mg by mouth See admin instructions. Take 1 tablet (10mg ) by mouth in the morning and take 1/2 tablet (5mg ) at bedtime.   icosapent Ethyl 1 g capsule Commonly known as: VASCEPA Take 2 capsules (2 g total) by mouth 2 (two) times daily.   metoprolol  succinate 25 MG 24 hr  tablet Commonly known as: TOPROL -XL Take 1 tablet (25 mg total) by mouth daily.   mexiletine 250 MG capsule Commonly known as: MEXITIL Take 1 capsule (250 mg total) by mouth every 12 (twelve) hours.   mycophenolate 250 MG capsule Commonly known as: CELLCEPT Take 250 mg by mouth 2 (two) times daily.   nitroGLYCERIN 0.4 MG SL tablet Commonly known as: NITROSTAT Place 1 tablet (0.4 mg total) under the tongue every 5 (five) minutes x 3 doses as needed for chest pain.   omeprazole 40 MG capsule Commonly known as: PRILOSEC Take 40 mg by mouth daily.   sertraline 100 MG tablet Commonly known as: ZOLOFT Take 100 mg by mouth daily.   sirolimus 1 MG tablet Commonly known as: RAPAMUNE Take 1 mg by mouth daily.   ticagrelor 90 MG Tabs tablet Commonly known as: BRILINTA Take 1 tablet (90 mg total) by mouth 2 (two) times daily.         Outstanding Labs/Studies  FLP/LFTs in 8 weeks BMET at follow up cMRI in a few weeks  Duration of Discharge Encounter: APP Time: 25 minutes   Signed, Manuelita Rummer, NP 11/11/2023, 9:41 AM   I have personally seen and examined this patient. I agree with the assessment and plan as outlined above.  67 yo male admitted with acute inferior STEMI secondary to thrombotic occlusion of the RCA, complicated by CHB and hypotension. One DES placed in RCA. He had VT and was transferred to the unit. EP team following. Mexilitine and Toprol  on board.  He is doing well today. No further VT. Labs reviewed by me EKG reviewed by me: sinus My exam: NAD, RRR Clear lungs Ext: no LE edema Plan: CAD: No chest pain. Will discharge home on ASA, Brilinta and beta blocker.  VT: EP recs mexilitine and Toprol . Echo in 3 months  D/c home today  I spent 30 minutes in discharge planning, note review, lab review, EKG review, tele review, examination and plan formulation with note construction.   Lonni Cash, MD, [facility] 11/11/2023 9:53 AM

## 2023-11-11 NOTE — Telephone Encounter (Signed)
   Transition of Care Follow-up Phone Call Request    Patient Name: Randall Frazier Date of Birth: 10-Aug-1955 Date of Encounter: 11/11/2023  Primary Care Provider:  [facility] Primary Cardiologist:  Ozell Fell, MD  Randall Frazier has been scheduled for a transition of care follow up appointment with a [facility] provider:  Scot Ford 7/14  Please reach out to Randall Frazier within 48 hours of discharge to confirm appointment and review transition of care protocol questionnaire. Anticipated discharge date: 7/2  Manuelita Rummer, NP  11/11/2023, 9:25 AM

## 2023-11-11 NOTE — Plan of Care (Signed)
  Problem: Education: Goal: Knowledge of General Education information will improve Description: Including pain rating scale, medication(s)/side effects and non-pharmacologic comfort measures Outcome: Adequate for Discharge   Problem: Health Behavior/Discharge Planning: Goal: Ability to manage health-related needs will improve Outcome: Adequate for Discharge   Problem: Clinical Measurements: Goal: Ability to maintain clinical measurements within normal limits will improve Outcome: Adequate for Discharge Goal: Will remain free from infection Outcome: Adequate for Discharge Goal: Diagnostic test results will improve Outcome: Adequate for Discharge Goal: Respiratory complications will improve Outcome: Adequate for Discharge Goal: Cardiovascular complication will be avoided Outcome: Adequate for Discharge   Problem: Activity: Goal: Risk for activity intolerance will decrease Outcome: Adequate for Discharge   Problem: Nutrition: Goal: Adequate nutrition will be maintained Outcome: Adequate for Discharge   Problem: Coping: Goal: Level of anxiety will decrease Outcome: Adequate for Discharge   Problem: Elimination: Goal: Will not experience complications related to bowel motility Outcome: Adequate for Discharge Goal: Will not experience complications related to urinary retention Outcome: Adequate for Discharge   Problem: Pain Managment: Goal: General experience of comfort will improve and/or be controlled Outcome: Adequate for Discharge   Problem: Safety: Goal: Ability to remain free from injury will improve Outcome: Adequate for Discharge   Problem: Skin Integrity: Goal: Risk for impaired skin integrity will decrease Outcome: Adequate for Discharge   Problem: Education: Goal: Understanding of cardiac disease, CV risk reduction, and recovery process will improve Outcome: Adequate for Discharge Goal: Individualized Educational Video(s) Outcome: Adequate for Discharge    Problem: Activity: Goal: Ability to tolerate increased activity will improve Outcome: Adequate for Discharge   Problem: Cardiac: Goal: Ability to achieve and maintain adequate cardiovascular perfusion will improve Outcome: Adequate for Discharge   Problem: Health Behavior/Discharge Planning: Goal: Ability to safely manage health-related needs after discharge will improve Outcome: Adequate for Discharge   Problem: Education: Goal: Understanding of CV disease, CV risk reduction, and recovery process will improve Outcome: Adequate for Discharge Goal: Individualized Educational Video(s) Outcome: Adequate for Discharge   Problem: Activity: Goal: Ability to return to baseline activity level will improve Outcome: Adequate for Discharge   Problem: Cardiovascular: Goal: Ability to achieve and maintain adequate cardiovascular perfusion will improve Outcome: Adequate for Discharge Goal: Vascular access site(s) Level 0-1 will be maintained Outcome: Adequate for Discharge   Problem: Health Behavior/Discharge Planning: Goal: Ability to safely manage health-related needs after discharge will improve Outcome: Adequate for Discharge

## 2023-11-11 NOTE — Progress Notes (Signed)
 Telemetry reviewed SB/SR 50's-60's, no VT, no heart block Continue Toprol  25mg  daily Mexiletine 250mg  BID EP follow up will be arrange Anticipate Mexiletine will be temporary ~ 37mo   Charlies Arthur, PA-C

## 2023-11-11 NOTE — TOC Transition Note (Signed)
 Transition of Care [facility]) - Discharge Note   Patient Details  Name: Randall Frazier MRN: [id number] Date of Birth: 10/27/1955  [facility] CM/SW Contact:  Roxie KANDICE Stain, RN Phone Number: 11/11/2023, 10:15 AM   Clinical Narrative:    Patient stable for discharge.  No TOC needs at this time.     Barriers to Discharge: Continued Medical Work up   Patient Goals and CMS Choice Patient states their goals for this hospitalization and ongoing recovery are:: wants to get better CMS Medicare.gov Compare Post Acute Care list provided to:: Patient Choice offered to / list presented to : Patient      Discharge Placement                 Home      Discharge Plan and Services Additional resources added to the After Visit Summary for     Discharge Planning Services: CM Consult Post Acute Care Choice: Home Health                               Social Drivers of Health (SDOH) Interventions SDOH Screenings   Food Insecurity: No Food Insecurity (11/07/2023)  Housing: Low Risk  (11/07/2023)  Transportation Needs: No Transportation Needs (11/07/2023)  Utilities: Not At Risk (11/07/2023)  Financial Resource Strain: Low Risk  (05/20/2022)   Received from [facility]  Social Connections: Unknown (11/07/2023)  Stress: No Stress Concern Present (02/10/2020)   Received from [facility]  Tobacco Use: Low Risk  (11/07/2023)     Readmission Risk Interventions    11/11/2023   10:15 AM  Readmission Risk Prevention Plan  Transportation Screening Complete  PCP or Specialist Appt within 3-5 Days Complete  HRI or Home Care Consult Complete  Social Work Consult for Recovery Care Planning/Counseling Complete  Palliative Care Screening Not Applicable  Medication Review [employer]) Complete

## 2023-11-11 NOTE — Care Management Important Message (Signed)
 Important Message  Patient Details  Name: Randall Frazier MRN: [id number] Date of Birth: 06-28-1955   Important Message Given:     Patient left prior to IM delivery will mail a copy to the patient home address.    Jennalynn Rivard 11/11/2023, 2:05 PM

## 2023-11-12 NOTE — Telephone Encounter (Addendum)
 Patient contacted regarding discharge from [facility] on 11/11/23.   Patient understands to follow up with Hao Meng on 11/23/23 at 2:45 pm Patient understands discharge instructions? Yes  Patient understands medications and regiment? Yes  Patient understands to bring all medications to this visit? Yes   No Vascepa from [place] until this coming Monday. They had 10 days worth of Mexiletine for now but they will be getting more and are aware to call if need assistance w this.   Band Aids on wrist and groin site will be removed when he showers today.

## 2023-11-23 ENCOUNTER — Ambulatory Visit: Attending: Physician Assistant | Admitting: Physician Assistant

## 2023-11-23 ENCOUNTER — Telehealth (HOSPITAL_COMMUNITY): Payer: Self-pay

## 2023-11-23 ENCOUNTER — Encounter: Payer: Self-pay | Admitting: Physician Assistant

## 2023-11-23 ENCOUNTER — Ambulatory Visit (INDEPENDENT_AMBULATORY_CARE_PROVIDER_SITE_OTHER): Admitting: Pharmacist

## 2023-11-23 ENCOUNTER — Encounter: Payer: Self-pay | Admitting: Oncology

## 2023-11-23 VITALS — BP 126/81 | HR 56 | Ht 72.0 in | Wt 218.6 lb

## 2023-11-23 DIAGNOSIS — Z944 Liver transplant status: Secondary | ICD-10-CM

## 2023-11-23 DIAGNOSIS — E782 Mixed hyperlipidemia: Secondary | ICD-10-CM | POA: Diagnosis not present

## 2023-11-23 DIAGNOSIS — I25810 Atherosclerosis of coronary artery bypass graft(s) without angina pectoris: Secondary | ICD-10-CM

## 2023-11-23 DIAGNOSIS — I10 Essential (primary) hypertension: Secondary | ICD-10-CM

## 2023-11-23 DIAGNOSIS — R001 Bradycardia, unspecified: Secondary | ICD-10-CM

## 2023-11-23 DIAGNOSIS — N184 Chronic kidney disease, stage 4 (severe): Secondary | ICD-10-CM

## 2023-11-23 DIAGNOSIS — I4729 Other ventricular tachycardia: Secondary | ICD-10-CM

## 2023-11-23 DIAGNOSIS — I255 Ischemic cardiomyopathy: Secondary | ICD-10-CM

## 2023-11-23 MED ORDER — TICAGRELOR 90 MG PO TABS: 90.0000 mg | ORAL_TABLET | Freq: Two times a day (BID) | ORAL | 3 refills | Status: DC

## 2023-11-23 MED ORDER — ICOSAPENT ETHYL 1 G PO CAPS: 2.0000 g | ORAL_CAPSULE | Freq: Two times a day (BID) | ORAL | 3 refills | Status: DC

## 2023-11-23 MED ORDER — REPATHA SURECLICK 140 MG/ML ~~LOC~~ SOAJ: 1.0000 mL | SUBCUTANEOUS | 11 refills | Status: DC

## 2023-11-23 MED ORDER — EZETIMIBE 10 MG PO TABS: 10.0000 mg | ORAL_TABLET | Freq: Every day | ORAL | 3 refills | Status: DC

## 2023-11-23 MED ORDER — METOPROLOL SUCCINATE ER 25 MG PO TB24: 25.0000 mg | ORAL_TABLET | Freq: Every day | ORAL | 3 refills | Status: DC

## 2023-11-23 NOTE — Patient Instructions (Signed)
 Medication Instructions:  NO CHANGES *If you need a refill on your cardiac medications before your next appointment, please call your pharmacy*  Lab Work: FASTING LIPID PANEL AND LFT IN 6 WEEKS If you have labs (blood work) drawn today and your tests are completely normal, you will receive your results only by: MyChart Message (if you have MyChart) OR A paper copy in the mail If you have any lab test that is abnormal or we need to change your treatment, we will call you to review the results.  Testing/Procedures: NO TESTING  Follow-Up: At [facility], you and your health needs are our priority.  As part of our continuing mission to provide you with exceptional heart care, our providers are all part of one team.  This team includes your primary Cardiologist (physician) and Advanced Practice Providers or APPs (Physician Assistants and Nurse Practitioners) who all work together to provide you with the care you need, when you need it.  Your next appointment:   KEEP FOLLOW UP AUGUST 2025  Provider:   Charlies Arthur, PA-C   Then, Ozell Fell, MD will plan to see you again in 3-4 month(s).

## 2023-11-23 NOTE — Telephone Encounter (Signed)
 Outside/paper referral received by Dr. Milissa from [place]. Will pass to nurse for review.  [facility] Auth# [id number]  Attempted to call patient regarding interest in cardiac rehab- no answer, left message to call us  back. Sent MyChart message.

## 2023-11-23 NOTE — Progress Notes (Unsigned)
 Cardiology Office Note   Date:  11/24/2023  ID:  Randall, Frazier Oct 02, 1955, MRN [id number] [facility]CP: [facility] Providers Cardiologist:  Ozell Fell, MD     History of Present Illness Randall Frazier is a 68 y.o. male with PMH of CAD s/p CABG, liver transplant, autoimmune disorder, CKD stage IIIb, chronic fungal sinusitis and hyperlipidemia.  The patient was recently admitted to the hospital on 11/07/2023 due to acute onset of severe substernal chest discomfort and diaphoresis.  EKG demonstrated acute inferior STEMI with marked bradycardia and a ventricular rate down to the 30s.  Temporary pacer was placed for complete heart block with hemodynamic instability and hypotension.  Patient was also treated with norepi.  He was taken urgently to the Cath Lab.  Cardiac catheterization performed on 10/30/2023 showed 100% occluded distal RCA treated with 3.0 x 20 mm Synergy DES, severe distal left main stenosis, total occlusion of proximal LAD with patent LIMA-LAD, severe proximal and mid left circumflex lesion with patent SVG to ramus intermedius/OM1.  Complete heart block resolved after reperfusion. He was placed on aspirin  and Brilinta .  Echocardiogram obtained on 11/08/2023 showed EF 40 to 45%, regional wall motion abnormality, grade 1 DD, severe hypokinesis of the LV mid apical inferoseptal wall and the inferior wall and apical segment, RVSP 16.1 mmHg, moderate MR, evidence of atrial level shunting detected by color-flow Doppler.  There was no evidence of LV mural or apical thrombus.  Patient was seen by EP service, temporary pacing wire was removed.  Patient developed wide-complex tachycardia during the hospitalization that was felt to be slow VT, however he was asymptomatic.  He was placed on amiodarone  drip. EP did not recommend ICD at this time.  Low-dose beta-blocker was started.  Patient was placed on mexiletine 250 mg twice a day.  Based on discharge summary, it appears  mexiletine is likely going to be temporary for the next 3 months.  Triglyceride was 708, total cholesterol 284, unable to calculate LDL.  During the hospitalization, patient was also started on Zetia  and Vascepa  but plan for Repatha  as outpatient.  Patient presents today for follow-up accompanied by wife.  Both of them veterans.  Wife used to be Associate Professor.  He has been feeling well since he left the hospital.  Based on record brought in by wife, blood pressure and heart rate has been well-controlled at home.  Systolic blood pressure ranging from 100-1 20s.  Heart rate mostly in the 50s but occasionally peak up to 60s.  He has poor renal function unfortunately make him not a candidate for ACE inhibitor/ARB/ARNI, MRA and also SGLT2 inhibitor.  Given recent bradycardia, I decided to leave him on the low-dose of metoprolol  succinate instead of increasing the dosage.  He will need 6 weeks fasting lipid panel and fatigue after starting on the Repatha  which was prescribed today by our clinical pharmacist.  He will need to follow-up with the EP to decide on the duration of mexiletine.  He can follow-up with Dr. Fell in 3 to 4 months.  Will message Dr. Fell to potentially consider a repeat limited echocardiogram to reassess EF prior to follow-up.  ROS:   He denies chest pain, palpitations, dyspnea, pnd, orthopnea, n, v, dizziness, syncope, edema, weight gain, or early satiety. All other systems reviewed and are otherwise negative except as noted above.    Studies Reviewed EKG Interpretation Date/Time:  Monday November 23 2023 15:03:29 EDT Ventricular Rate:  56 PR Interval:  176 QRS Duration:  106 QT Interval:  478 QTC Calculation: 461 R Axis:   -10  Text Interpretation: Normal sinus rhythm T wave inversion in the inferolateral leads Q wave in the inferior leads Confirmed by Janene Boer [phone]) on 11/24/2023 2:49:49 PM    Cardiac Studies & Procedures    ______________________________________________________________________________________________ CARDIAC CATHETERIZATION  CARDIAC CATHETERIZATION 11/07/2023  Conclusion 1.  Acute STEMI involving the RCA complicated by complete heart block and hypotension.  Distal RCA 100% thrombotic occlusion at baseline, treated with primary PCI necessitating a guide extension catheter, with 0% residual stenosis and TIMI-3 flow after stenting with a 3.0 x 20 mm Synergy DES 2.  Severe distal left main stenosis 3.  Total occlusion of the proximal LAD with patent LIMA to LAD graft 4.  Severe proximal and mid circumflex stenoses with a patent saphenous vein graft to ramus intermedius/OM1 5.  Complete heart block complicating acute MI, necessitating temporary venous pacing but resolving after reperfusion 6.  Normal LVEDP 12 mmHg, serial lactate 1.8--->1.5  Recommendations: Gentle fluid hydration to minimize risk of AKI on background of chronic kidney disease (creatinine 2.5 today).  DAPT with aspirin  and ticagrelor  12 months without interruption.  Check 2D echo for assessment of LV function.  Favor medical therapy for residual CAD in the context of the patient's comorbid medical problems.  Findings Coronary Findings Diagnostic  Dominance: Right  Left Main Mid LM to Dist LM lesion is 90% stenosed. The lesion is severely calcified.  Left Anterior Descending Prox LAD lesion is 100% stenosed.  Left Circumflex Prox Cx to Mid Cx lesion is 80% stenosed. Dist Cx lesion is 90% stenosed.  Right Coronary Artery There is mild diffuse disease throughout the vessel. The vessel is severely calcified. The RCA is severely calcified.  The vessel has mild diffuse disease through the proximal and mid portions and then total occlusion in the distal vessel with TIMI 0 flow. Dist RCA lesion is 100% stenosed.  LIMA LIMA Graft To Mid LAD LIMA graft was visualized by angiography.  The graft exhibits no disease. The LIMA to LAD  graft is widely patent with no stenosis.  The LAD wraps around the LV apex.  Saphenous Graft To 1st Mrg SVG.  Saphenous vein graft OM1/ramus is patent with no significant stenosis.  Intervention  Dist RCA lesion Stent CATH INFINITI 5FR AL1 guide catheter was inserted. Lesion crossed with guidewire using a WIRE RUNTHROUGH IZANAI 014 180. Pre-stent angioplasty was performed using a BALLOON EMERGE MR 2.5X15. A drug-eluting stent was successfully placed using a STENT SYNERGY XD 3.0X20. Post-stent angioplasty was performed using a BALLOON [place] EMERGE MR 3.25X15. Post-Intervention Lesion Assessment The intervention was successful. Pre-interventional TIMI flow is 0. Post-intervention TIMI flow is 3. No complications occurred at this lesion. There is a 0% residual stenosis post intervention.     ECHOCARDIOGRAM  ECHOCARDIOGRAM COMPLETE 11/08/2023  Narrative ECHOCARDIOGRAM REPORT    Patient Name:   Randall Frazier Date of Exam: 11/08/2023 Medical Rec #:  [id number]      Height:       72.0 in Accession #:    [id number]     Weight:       216.0 lb Date of Birth:  1955/06/11      BSA:          2.201 m Patient Age:    67 years       BP:           95/51 mmHg Patient Gender: M  HR:           62 bpm. Exam Location:  Inpatient  Procedure: 2D Echo, Cardiac Doppler, Color Doppler and Intracardiac Opacification Agent (Both Spectral and Color Flow Doppler were utilized during procedure).  Indications:    I25.110 Atherosclerotic heart disease of native coronary artery with unstable angina pectoris; 122-I22.9 Subsequent ST elevation (STEM) and non-ST elevation (NSTEMI) myocardial infarction  History:        Patient has prior history of Echocardiogram examinations, most recent 09/14/2019. Previous Myocardial Infarction and CAD, Abnormal ECG, Arrythmias:Bradycardia, Signs/Symptoms:Altered Mental Status, Shortness of Breath, Dyspnea and Chest Pain; Risk Factors:Hypertension. PFO.  Sonographer:     Ellouise Mose RDCS Referring Phys: [phone] MICHAEL COOPER   Sonographer Comments: Technically difficult study due to poor echo windows. IMPRESSIONS   1. There is no left ventricular thrombus (definity  contrast was used). Left ventricular ejection fraction, by estimation, is 40 to 45%. The left ventricle has mildly decreased function. The left ventricle demonstrates regional wall motion abnormalities (see scoring diagram/findings for description). Left ventricular diastolic parameters are consistent with Grade I diastolic dysfunction (impaired relaxation). There is severe hypokinesis of the left ventricular, mid-apical inferoseptal wall, inferior wall and apical segment. 2. Right ventricular systolic function is moderately reduced. The right ventricular size is moderately enlarged. The estimated right ventricular systolic pressure is 16.1 mmHg. 3. Left atrial size was mildly dilated. 4. The mitral valve is normal in structure. Moderate mitral valve regurgitation. No evidence of mitral stenosis. 5. The aortic valve is grossly normal. Aortic valve regurgitation is not visualized. No aortic stenosis is present. 6. Evidence of atrial level shunting detected by color flow Doppler.  Comparison(s): Prior images reviewed side by side. Changes from prior study are noted. The left ventricular function is worsened. The left ventricular wall motion new. There is interval reduction in left ventricular function due to infarction in the distribution of the right coronary artery.  Conclusion(s)/Recommendation(s): No left ventricular mural or apical thrombus/thrombi.  FINDINGS Left Ventricle: There is no left ventricular thrombus (definity  contrast was used). Left ventricular ejection fraction, by estimation, is 40 to 45%. The left ventricle has mildly decreased function. The left ventricle demonstrates regional wall motion abnormalities. Severe hypokinesis of the left ventricular, mid-apical inferoseptal wall,  inferior wall and apical segment. Definity  contrast agent was given IV to delineate the left ventricular endocardial borders. The left ventricular internal cavity size was normal in size. There is no left ventricular hypertrophy. Left ventricular diastolic parameters are consistent with Grade I diastolic dysfunction (impaired relaxation). Normal left ventricular filling pressure.   LV Wall Scoring: The mid and distal inferior wall, mid inferoseptal segment, apical septal segment, and apex are hypokinetic.  Right Ventricle: The right ventricular size is moderately enlarged. No increase in right ventricular wall thickness. Right ventricular systolic function is moderately reduced. The tricuspid regurgitant velocity is 1.81 m/s, and with an assumed right atrial pressure of 3 mmHg, the estimated right ventricular systolic pressure is 16.1 mmHg.  Left Atrium: Left atrial size was mildly dilated.  Right Atrium: Right atrial size was normal in size.  Pericardium: There is no evidence of pericardial effusion.  Mitral Valve: The mitral valve is normal in structure. Moderate mitral valve regurgitation, with centrally-directed jet. No evidence of mitral valve stenosis.  Tricuspid Valve: The tricuspid valve is normal in structure. Tricuspid valve regurgitation is trivial. No evidence of tricuspid stenosis.  Aortic Valve: The aortic valve is grossly normal. Aortic valve regurgitation is not visualized. No aortic stenosis is present.  Pulmonic Valve: The pulmonic valve was normal in structure. Pulmonic valve regurgitation is not visualized. No evidence of pulmonic stenosis.  Aorta: The aortic root and ascending aorta are structurally normal, with no evidence of dilitation.  IAS/Shunts: Evidence of atrial level shunting detected by color flow Doppler.   LEFT VENTRICLE PLAX 2D LVIDd:         4.60 cm      Diastology LVIDs:         3.40 cm      LV e' medial:    5.87 cm/s LV PW:         1.10 cm       LV E/e' medial:  12.7 LV IVS:        1.20 cm      LV e' lateral:   11.20 cm/s LVOT diam:     2.30 cm      LV E/e' lateral: 6.7 LV SV:         85 LV SV Index:   39 LVOT Area:     4.15 cm  LV Volumes (MOD) LV vol d, MOD A2C: 81.2 ml LV vol d, MOD A4C: 113.0 ml LV vol s, MOD A2C: 45.9 ml LV vol s, MOD A4C: 66.6 ml LV SV MOD A2C:     35.3 ml LV SV MOD A4C:     113.0 ml LV SV MOD BP:      41.6 ml  RIGHT VENTRICLE            IVC RV S prime:     4.90 cm/s  IVC diam: 1.30 cm TAPSE (M-mode): 0.3 cm  LEFT ATRIUM             Index        RIGHT ATRIUM           Index LA diam:        4.10 cm 1.86 cm/m   RA Area:     10.70 cm LA Vol (A2C):   24.2 ml 10.99 ml/m  RA Volume:   21.80 ml  9.90 ml/m LA Vol (A4C):   40.1 ml 18.22 ml/m LA Biplane Vol: 32.6 ml 14.81 ml/m AORTIC VALVE LVOT Vmax:   96.60 cm/s LVOT Vmean:  61.600 cm/s LVOT VTI:    0.205 m  AORTA Ao Root diam: 3.60 cm Ao Asc diam:  3.60 cm  MITRAL VALVE               TRICUSPID VALVE MV Area (PHT): 3.17 cm    TR Peak grad:   13.1 mmHg MV Decel Time: 239 msec    TR Vmax:        181.00 cm/s MV E velocity: 74.50 cm/s MV A velocity: 94.60 cm/s  SHUNTS MV E/A ratio:  0.79        Systemic VTI:  0.20 m Systemic Diam: 2.30 cm  Mihai Croitoru MD Electronically signed by Jerel Balding MD Signature Date/Time: 11/08/2023/12:16:43 PM    Final          ______________________________________________________________________________________________      Risk Assessment/Calculations           Physical Exam VS:  BP 126/81 (BP Location: Left Arm, Patient Position: Sitting, Cuff Size: Large)   Pulse (!) 56   Ht 6' (1.829 m)   Wt 218 lb 9.6 oz (99.2 kg)   SpO2 98%   BMI 29.65 kg/m        Wt Readings from Last 3 Encounters:  11/23/23 218 lb 9.6  oz (99.2 kg)  11/07/23 216 lb (98 kg)  07/12/21 215 lb 7 oz (97.7 kg)    GEN: Well nourished, well developed in no acute distress NECK: No JVD; No carotid bruits CARDIAC:  RRR, no murmurs, rubs, gallops RESPIRATORY:  Clear to auscultation without rales, wheezing or rhonchi  ABDOMEN: Soft, non-tender, non-distended EXTREMITIES:  No edema; No deformity   ASSESSMENT AND PLAN  CAD s/p CABG: Recently had inferior STEMI with occluded distal RCA treated with DES.  Patent bypass graft.  Troponin peaked at greater than 24,000.  EF was 40 to 45%.  Ischemic cardiomyopathy: EF dropped to 40 to 45% after the recent inferior STEMI.  No ACE inhibitor, ARB, ARNI, spironolactone  and SGLT2i due to poor renal function.  Consider repeat echocardiogram on the next follow-up.  Bradycardia: Resolved, had a heart rate in the 30s and a complete heart block during inferior STEMI.  Currently on 25 mg daily of metoprolol  succinate.  No further sign of bradycardia.  Nonsustained VT:  Occurred in the setting of inferior STEMI.  On mexiletine 250 mg twice a day.  Upcoming follow-up with electrophysiology service, likely will be a short month course of mexiletine and stop after that.  History of liver transfer: On immunosuppression  Hypertension: Blood pressure stable  Hyperlipidemia: On Zetia , Vascepa  and Repatha .  Fasting lipid panel and IFT in 4 weeks.  CKD stage IV: Blood work repeated at [facility] after discharge, followed by [facility].     Cardiac Rehabilitation Eligibility Assessment  The patient is ready to start cardiac rehabilitation from a cardiac standpoint.       Dispo: Follow-up with Dr. Wonda in 3 to 4 months  Signed, Clessie Karras, PA

## 2023-11-23 NOTE — Assessment & Plan Note (Addendum)
 Assessment: LDL-C is above goal of <55 Pt has hx of statin intolerance- per chart myalgias/increase LFT Patient also reports cramping with statins Hx of liver transplant Elevated TG- just started on Vascepa . Renal function borderline for using fenofibrate Given h/x of liver transplant and myalgais/cramps to at least 2 different statins, would favor PCKS9i Discussed PCKS9i- reviewed injection technique  Plan: Will send PCKS9i rx to [facility] Will send email to Dr. Debarah and will fax note to [facility]

## 2023-11-23 NOTE — Progress Notes (Signed)
 Patient ID: Randall Frazier                 DOB: [birth year]                    MRN: [id number]      HPI: Randall Frazier is a 68 y.o. male patient referred to lipid clinic by Dr. Rolan. 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  Successful liver transplantation he has been monitored  carefully by the transplant team all his laboratory parameters are within normal  limits he still remains in stage III-IV renal insufficiency but improved  dramatically at baseline at 2.3 which is being monitored  Patient had a inferior STEMI 11/07/23. Underwent cardiac catheterization with acute distal RCA thrombotic occlusion of 100% treated with PCI/DES x1, in the setting of complete heart block necessitating pacer after reperfusion. Total cholesterol 284, triglycerides 708, unable to calculate LDL. Patient statin intolerant. LDL-C 172 at [facility] in Feb, TG 298. Vascepa  and Zetia  were started at d/c.  Patient presents today accompanied by his wife. He reports having cramps to statins in the past. His primary care MD is at the [facility].  MD Jayson Ill. [email or web address] He does have [employer]are for Life, but is 100% disabled so prefers to get meds from [facility] because they are free.   LFT on 7/9 ALT 23, AST 19 after starting ezetimibe .   Hx of high TG and LCL-C. Kidney function is very borderline for fenofibrate   Current Medications: ezetimibe  10mg  daily, Vascepa  2g twice a day Intolerances: pravastatin, simvastatin (increased LFT/myalgias/cramps) Risk Factors: progressive ASCVD, CKD LDL-C goal: <55 ApoB goal: <70   Family History:  Family History  Problem Relation Age of Onset   Liver disease Sister    Diabetes Neg Hx     Social History: no ETOH, no tobacco  Labs: Lipid Panel  06/16/23 CHOLESTEROL (ATELLICA) 268 mg/dL H 74-799          TRIGLYCERIDES (ATELLICA) 298 mg/dL H 84-850        HDL CHOLESTEROL (ATELLICA) 34.4 mg/dL L 59-39        LDL CHOLESTEROL (ATELLICA) 172.0 mg/dL H 4-00       Component Value Date/Time   CHOL 260 (H) 11/08/2023 0346   TRIG 522 (H) 11/08/2023 0346   HDL 24 (L) 11/08/2023 0346   CHOLHDL 10.8 11/08/2023 0346   VLDL UNABLE TO CALCULATE IF TRIGLYCERIDE OVER 400 mg/dL 93/70/7974 9653   LDLCALC UNABLE TO CALCULATE IF TRIGLYCERIDE OVER 400 mg/dL 93/70/7974 9653   LDLDIRECT 155 (H) 11/08/2023 0346    Past Medical History:  Diagnosis Date   Arthritis    shoulder- both, neck, spine,  GOUT   Diabetes mellitus    Gout    H/O exercise stress test    many yrs. 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 weight loss -over 18 mon. period     Current Outpatient Medications on File Prior to Visit  Medication Sig Dispense Refill   aspirin  81 MG chewable tablet Chew 81 mg by mouth daily.     ezetimibe  (ZETIA ) 10 MG tablet Take 1 tablet (10 mg total) by mouth daily. 30 tablet 0   hydrocortisone  (CORTEF ) 10 MG tablet Take 5-10 mg by mouth  See admin instructions. Take 1 tablet (10mg ) by mouth in the morning and take 1/2 tablet (5mg ) at bedtime.     icosapent  Ethyl (VASCEPA ) 1 g capsule Take 2 capsules (2 g total) by mouth 2 (two) times daily. 120 capsule 0   Isavuconazonium Sulfate  (CRESEMBA ) 186 MG CAPS Take 372 mg by mouth daily.     metoprolol  succinate (TOPROL -XL) 25 MG 24 hr tablet Take 1 tablet (25 mg total) by mouth daily. 30 tablet 0   mexiletine (MEXITIL ) 250 MG capsule Take 1 capsule (250 mg total) by mouth every 12 (twelve) hours. 60 capsule 0   mycophenolate  (CELLCEPT ) 250 MG capsule Take 250 mg by mouth 2 (two) times daily.     nitroGLYCERIN  (NITROSTAT ) 0.4 MG SL tablet Place 1 tablet (0.4 mg total) under the tongue every 5 (five) minutes x 3 doses as needed for chest pain. 25 tablet 0   omeprazole (PRILOSEC) 40 MG capsule Take 40 mg by mouth daily.     sertraline  (ZOLOFT ) 100 MG tablet Take 100 mg by mouth daily.     sirolimus  (RAPAMUNE ) 1 MG tablet Take 1 mg by mouth daily.     ticagrelor  (BRILINTA ) 90 MG TABS tablet Take 1 tablet (90 mg total) by mouth 2 (two) times daily. 60 tablet 0   No current facility-administered medications on file prior to visit.    Allergies  Allergen Reactions   Bromsite [Bromfenac Sodium] Other (See Comments)    Kidney disorder; serum creatinine raised.   Desyrel  [Trazodone ] Nausea Only   Lotensin [Benazepril] Cough   Mobic [Meloxicam] Other (See Comments)    Hx liver transplant   Neurontin [Gabapentin] Swelling   Nsaids Other (See Comments)    Kidney Disorder; serum creatinine above reference range   Pork Allergy Other (See Comments)    Gout flares   Pravachol [Pravastatin] Other (See Comments)    Liver enzymes abnormal; outside reference range Myalgias   Statins Other (See Comments)    Liver enzymes abnormal; outside reference range  Myalgias Tried on Pravachol and Zocor   Toradol  [Ketorolac  Tromethamine ] Other (See Comments)    Kidney disorder; serum creatinine above  reference range   Zocor [Simvastatin] Other (See Comments)    Liver enzymes abnormal; outside reference range Myalgias   Niaspan [Niacin] Other (See Comments)    Vasomotor symptoms   Zestril [Lisinopril] Swelling, Dermatitis, Rash and Other (See Comments)    Assessment/Plan:  1. Hyperlipidemia -  Hyperlipidemia Assessment: LDL-C is above goal of <55 Pt has hx of statin intolerance- per chart myalgias/increase LFT Patient also reports cramping with statins Hx of liver transplant Elevated TG- just started on Vascepa . Renal function borderline for  using fenofibrate Given h/x of liver transplant and myalgais/cramps to at least 2 different statins, would favor PCKS9i Discussed PCKS9i- reviewed injection technique  Plan: Will send PCKS9i rx to [facility] Will send email to Dr. Debarah and [facility]    Thank you,  Eleanor JONETTA Crews, Pharm.JONETTA SARAN, CPP [facility] of [facility] [address] [place], [place] [address]  Phone: [phone]; Fax: [phone]

## 2023-11-26 ENCOUNTER — Encounter: Payer: Self-pay | Admitting: Oncology

## 2023-11-27 ENCOUNTER — Telehealth: Payer: Self-pay | Admitting: Physician Assistant

## 2023-11-27 ENCOUNTER — Other Ambulatory Visit (HOSPITAL_COMMUNITY): Payer: Self-pay

## 2023-11-27 ENCOUNTER — Telehealth: Payer: Self-pay | Admitting: Pharmacy Technician

## 2023-11-27 ENCOUNTER — Encounter: Payer: Self-pay | Admitting: Oncology

## 2023-11-27 DIAGNOSIS — I25810 Atherosclerosis of coronary artery bypass graft(s) without angina pectoris: Secondary | ICD-10-CM

## 2023-11-27 DIAGNOSIS — E782 Mixed hyperlipidemia: Secondary | ICD-10-CM

## 2023-11-27 MED ORDER — PRALUENT 75 MG/ML ~~LOC~~ SOAJ: 75.0000 mg | SUBCUTANEOUS | 5 refills | Status: DC

## 2023-11-27 NOTE — Telephone Encounter (Signed)
 Will forward to our lipid clinic/PharmD team for further management, being they initiated this medication on the pt.

## 2023-11-27 NOTE — Telephone Encounter (Signed)
 11/27/2023 - Patient Calls: Glenice Krabbe, CPhT and others (Newest Message First)            View All Conversations on this Encounter Darrell Bruckner, [facility] to Me (Selected Message)     11/27/23  3:24 PM Praluent sent to [facility] pharmacy   Called the pt and made him aware that [facility] with lipid clinic, sent in Praluent to [facility] for him, for this is covered by his insurance carrier vs the Repatha .   Pt verbalized understanding and agrees with this plan.  Pt was appreciative for the follow-up call about this.

## 2023-11-27 NOTE — Telephone Encounter (Signed)
 Katrina Radiographer, therapeutic) with [facility] requesting last ov note faxed to [phone] or call to discuss plan for pt

## 2023-11-27 NOTE — Telephone Encounter (Signed)
 Will forward this call to our [facility] HIM POOL for further management and assistance with releasing requested records to [facility], as indicated in this message.

## 2023-11-27 NOTE — Addendum Note (Signed)
 Addended by: DARRELL BRUCKNER on: 11/27/2023 03:24 PM   Modules accepted: Orders

## 2023-11-27 NOTE — Telephone Encounter (Addendum)
   Received this from the insurance:

## 2023-11-27 NOTE — Telephone Encounter (Signed)
 Received this from the patient's insurance:

## 2023-11-30 MED ORDER — PRALUENT 150 MG/ML ~~LOC~~ SOAJ: 150.0000 mg | SUBCUTANEOUS | 11 refills | Status: AC

## 2023-11-30 NOTE — Telephone Encounter (Signed)
 Not entirely sure about his devoted plan- but lets see if we can get coverage under [employer] for Vascepa  Also lets do it under tricare. They will deny for same reason he isnt on statin, but atleast I can appeal that

## 2023-11-30 NOTE — Telephone Encounter (Signed)
 I spoke to both pt and wife and let them know that we sent for Praluent . I would prefer to use 150 so I resent the rx, but if they send 75mg  its ok.

## 2023-11-30 NOTE — Addendum Note (Signed)
 Addended by: Juanelle Trueheart D on: 11/30/2023 05:10 PM   Modules accepted: Orders

## 2023-12-01 ENCOUNTER — Other Ambulatory Visit (HOSPITAL_COMMUNITY): Payer: Self-pay

## 2023-12-01 ENCOUNTER — Telehealth: Payer: Self-pay

## 2023-12-01 ENCOUNTER — Encounter: Payer: Self-pay | Admitting: Oncology

## 2023-12-01 DIAGNOSIS — E785 Hyperlipidemia, unspecified: Secondary | ICD-10-CM

## 2023-12-01 NOTE — Telephone Encounter (Signed)
 Pharmacy Patient Advocate Encounter   Received notification from Physician's Office that prior authorization for VASCEPA  is required/requested.   Insurance verification completed.   The patient is insured through [employer] .   Per test claim: PA required; PA submitted to above mentioned insurance via [facility]/confirmation #/EOC [id number] Status is pending

## 2023-12-01 NOTE — Telephone Encounter (Signed)
 PA request has been Submitted. New Encounter has been or will be created for follow up. For additional info see Pharmacy Prior Auth telephone encounter from 12/01/23.

## 2023-12-01 NOTE — Telephone Encounter (Signed)
 View All Conversations on this Encounter Keaton, Gekeitha E to Me  (Selected Message) GK    12/01/23  9:51 AM Medical records have been faxed to [facility], thanks.

## 2023-12-02 ENCOUNTER — Other Ambulatory Visit (HOSPITAL_COMMUNITY): Payer: Self-pay

## 2023-12-02 ENCOUNTER — Telehealth (HOSPITAL_COMMUNITY): Payer: Self-pay

## 2023-12-02 NOTE — Telephone Encounter (Signed)
 Originally called patient on 7/14 to check interest- no answer, left message. Sent MyChart message.  Attempted f/u call on 7/23- no answer, left message.

## 2023-12-02 NOTE — Telephone Encounter (Signed)
 Patient called back to get scheduled in the Cardiac Rehab Program. Patient will come in for orientation on 8/28 and will attend the 10:15 exercise class.  Sent MyChart message.

## 2023-12-04 ENCOUNTER — Other Ambulatory Visit (HOSPITAL_COMMUNITY): Payer: Self-pay

## 2023-12-04 ENCOUNTER — Encounter: Payer: Self-pay | Admitting: Oncology

## 2023-12-04 MED ORDER — OMEGA-3-ACID ETHYL ESTERS 1 G PO CAPS: 2.0000 g | ORAL_CAPSULE | Freq: Two times a day (BID) | ORAL | 3 refills | Status: DC

## 2023-12-04 NOTE — Telephone Encounter (Signed)
 Per [facility] D/[facility] Vascepa  request, changed to Lovaza.

## 2023-12-04 NOTE — Telephone Encounter (Addendum)
 Called insurance to get additional info on RX coverage. [employer] is primary payer. [employer] is secondary and medical only. Drug requests should go to [employer]. But when we try and submit to [employer] we get a rejection that request needs to go to primary payer.    Could be a CMM issue. Submitting PA request using [employer] form.

## 2023-12-04 NOTE — Telephone Encounter (Addendum)
 Pharmacy Patient Advocate Encounter   Received notification from Physician's Office that prior authorization for VASCEPA  is required/requested.   Insurance verification completed.   The patient is insured through [employer] .   Per test claim:  LOVAZA is preferred by the insurance.  If suggested medication is appropriate, Please send in a new RX and discontinue this one. If not, please advise as to why it's not appropriate so that we may request a Prior Authorization. Please note, some preferred medications may still require a PA.  If the suggested medications have not been trialed and there are no contraindications to their use, the PA will not be submitted, as it will not be approved.     [employer] form says Lovaza is preferred. Per form criteria Vascepa  coverage not approved without trial and failure of Lovaza. Please advise.

## 2023-12-07 NOTE — Addendum Note (Signed)
 Addended by: Tyger Wichman D on: 12/07/2023 12:31 PM   Modules accepted: Orders

## 2023-12-07 NOTE — Telephone Encounter (Signed)
 Wife made aware we sent lovaza  over to [facility] pharmacy. Unable to get Vascepa  approved

## 2023-12-17 ENCOUNTER — Telehealth: Payer: Self-pay | Admitting: Cardiovascular Disease

## 2023-12-17 NOTE — Telephone Encounter (Signed)
 Calling back to f/u on Clearance. Please advise

## 2023-12-17 NOTE — Telephone Encounter (Signed)
 Attempted to call patient, couldn't leave a voicemail due to mailbox being full. Will try again later.

## 2023-12-17 NOTE — Telephone Encounter (Signed)
 Calling to get update. Advise we tried calling the patient but wasn't able to get him. Ask that we called the patient back. Please advise

## 2023-12-17 NOTE — Telephone Encounter (Signed)
   Pre-operative Risk Assessment    Patient Name: Randall Frazier  DOB: 01/31/1956 MRN: [id number]   Date of last office visit: 11/24/23 Date of next office visit: 12/23/23   Request for Surgical Clearance    Procedure:  ERCP  Date of Surgery:  Clearance TBD                                Surgeon:   Surgeon's Group or Practice Name:  [facility] Phone number:  [phone] Fax number:  [phone]   Type of Clearance Requested:   - Medical  - Pharmacy:  Hold Ticagrelor  (Brilinta ) TBD    Type of Anesthesia:  Not Indicated   Additional requests/questions:     [name] [name]   12/17/2023, 8:18 AM

## 2023-12-17 NOTE — Telephone Encounter (Signed)
 Upcoming visit with Renee of EP service, given the finding of slow VT recently, would prefer EP service to make the call whether the patient can drive.

## 2023-12-17 NOTE — Telephone Encounter (Signed)
   Name: Randall Frazier  DOB: 1956-05-09  MRN: [id number]  Primary Cardiologist: Ozell Fell, MD  Chart reviewed as part of pre-operative protocol coverage. Because of [name] past medical history and time since last visit, he will require a follow-up in-office visit in order to better assess preoperative cardiovascular risk.  Pre-op covering staff: - Please schedule appointment and call patient to inform them. If patient already had an upcoming appointment within acceptable timeframe, please add pre-op clearance to the appointment notes so provider is aware. - Please contact requesting surgeon's office via preferred method (i.e, phone, fax) to inform them of need for appointment prior to surgery.   DAPT with aspirin  and ticagrelor  12 months without interruption was recommended. I will send this to Dr. Fell for advice on earliest holding parameters for DAPT.  Orren LOISE Fabry, PA-C  12/17/2023, 9:20 AM

## 2023-12-18 ENCOUNTER — Telehealth: Payer: Self-pay

## 2023-12-18 NOTE — Telephone Encounter (Signed)
 Called the patient and left him another detailed message explaining he needed an in office visit for pre-op clearance.

## 2023-12-18 NOTE — Telephone Encounter (Signed)
 Patient will have an in office appointment with Charlies Arthur, PA-C on 12/23/2023. Per Orren Fabry, PA-C, we can make this his pre-op visit as well.

## 2023-12-18 NOTE — Telephone Encounter (Signed)
 The patient had a STEMI 6 weeks ago. I can't clear him to hold Brilinta  without seeing him to discuss the risks and balancing that risk with the urgency of his scheduled procedure. He needs a formal preop visit as documented by [place].

## 2023-12-20 ENCOUNTER — Other Ambulatory Visit: Payer: Self-pay

## 2023-12-20 ENCOUNTER — Encounter (HOSPITAL_COMMUNITY): Payer: Self-pay | Admitting: Family Medicine

## 2023-12-20 ENCOUNTER — Emergency Department (HOSPITAL_COMMUNITY)

## 2023-12-20 ENCOUNTER — Inpatient Hospital Stay (HOSPITAL_COMMUNITY)
Admission: EM | Admit: 2023-12-20 | Discharge: 2023-12-26 | DRG: 392 | Disposition: A | Discharge status: Home / Self Care | Attending: Family Medicine | Admitting: Family Medicine

## 2023-12-20 DIAGNOSIS — E2740 Unspecified adrenocortical insufficiency: Secondary | ICD-10-CM

## 2023-12-20 DIAGNOSIS — N184 Chronic kidney disease, stage 4 (severe): Secondary | ICD-10-CM | POA: Diagnosis present

## 2023-12-20 DIAGNOSIS — K567 Ileus, unspecified: Secondary | ICD-10-CM | POA: Diagnosis present

## 2023-12-20 DIAGNOSIS — D72819 Decreased white blood cell count, unspecified: Secondary | ICD-10-CM | POA: Diagnosis present

## 2023-12-20 DIAGNOSIS — E872 Acidosis, unspecified: Secondary | ICD-10-CM | POA: Diagnosis present

## 2023-12-20 DIAGNOSIS — E871 Hypo-osmolality and hyponatremia: Secondary | ICD-10-CM | POA: Diagnosis present

## 2023-12-20 DIAGNOSIS — Z886 Allergy status to analgesic agent status: Secondary | ICD-10-CM

## 2023-12-20 DIAGNOSIS — I442 Atrioventricular block, complete: Secondary | ICD-10-CM | POA: Diagnosis present

## 2023-12-20 DIAGNOSIS — D84821 Immunodeficiency due to drugs: Secondary | ICD-10-CM | POA: Diagnosis present

## 2023-12-20 DIAGNOSIS — I130 Hypertensive heart and chronic kidney disease with heart failure and stage 1 through stage 4 chronic kidney disease, or unspecified chronic kidney disease: Secondary | ICD-10-CM | POA: Diagnosis present

## 2023-12-20 DIAGNOSIS — M109 Gout, unspecified: Secondary | ICD-10-CM | POA: Diagnosis present

## 2023-12-20 DIAGNOSIS — Z951 Presence of aortocoronary bypass graft: Secondary | ICD-10-CM

## 2023-12-20 DIAGNOSIS — M19012 Primary osteoarthritis, left shoulder: Secondary | ICD-10-CM | POA: Diagnosis present

## 2023-12-20 DIAGNOSIS — Z944 Liver transplant status: Secondary | ICD-10-CM

## 2023-12-20 DIAGNOSIS — Z7982 Long term (current) use of aspirin: Secondary | ICD-10-CM

## 2023-12-20 DIAGNOSIS — I10 Essential (primary) hypertension: Secondary | ICD-10-CM | POA: Diagnosis present

## 2023-12-20 DIAGNOSIS — Z79624 Long term (current) use of inhibitors of nucleotide synthesis: Secondary | ICD-10-CM

## 2023-12-20 DIAGNOSIS — Z6827 Body mass index (BMI) 27.0-27.9, adult: Secondary | ICD-10-CM

## 2023-12-20 DIAGNOSIS — I255 Ischemic cardiomyopathy: Secondary | ICD-10-CM | POA: Diagnosis present

## 2023-12-20 DIAGNOSIS — K5792 Diverticulitis of intestine, part unspecified, without perforation or abscess without bleeding: Secondary | ICD-10-CM | POA: Diagnosis not present

## 2023-12-20 DIAGNOSIS — M47812 Spondylosis without myelopathy or radiculopathy, cervical region: Secondary | ICD-10-CM | POA: Diagnosis present

## 2023-12-20 DIAGNOSIS — E1169 Type 2 diabetes mellitus with other specified complication: Secondary | ICD-10-CM | POA: Diagnosis present

## 2023-12-20 DIAGNOSIS — R109 Unspecified abdominal pain: Secondary | ICD-10-CM | POA: Diagnosis present

## 2023-12-20 DIAGNOSIS — Z888 Allergy status to other drugs, medicaments and biological substances status: Secondary | ICD-10-CM

## 2023-12-20 DIAGNOSIS — E785 Hyperlipidemia, unspecified: Secondary | ICD-10-CM | POA: Diagnosis present

## 2023-12-20 DIAGNOSIS — Z7902 Long term (current) use of antithrombotics/antiplatelets: Secondary | ICD-10-CM

## 2023-12-20 DIAGNOSIS — I252 Old myocardial infarction: Secondary | ICD-10-CM

## 2023-12-20 DIAGNOSIS — Z91014 Allergy to mammalian meats: Secondary | ICD-10-CM

## 2023-12-20 DIAGNOSIS — I5022 Chronic systolic (congestive) heart failure: Secondary | ICD-10-CM | POA: Diagnosis present

## 2023-12-20 DIAGNOSIS — E119 Type 2 diabetes mellitus without complications: Secondary | ICD-10-CM

## 2023-12-20 DIAGNOSIS — Z79899 Other long term (current) drug therapy: Secondary | ICD-10-CM

## 2023-12-20 DIAGNOSIS — E876 Hypokalemia: Secondary | ICD-10-CM | POA: Diagnosis present

## 2023-12-20 DIAGNOSIS — N179 Acute kidney failure, unspecified: Secondary | ICD-10-CM | POA: Diagnosis present

## 2023-12-20 DIAGNOSIS — E663 Overweight: Secondary | ICD-10-CM | POA: Diagnosis present

## 2023-12-20 DIAGNOSIS — F32A Depression, unspecified: Secondary | ICD-10-CM

## 2023-12-20 DIAGNOSIS — Z9861 Coronary angioplasty status: Secondary | ICD-10-CM | POA: Diagnosis not present

## 2023-12-20 DIAGNOSIS — I2510 Atherosclerotic heart disease of native coronary artery without angina pectoris: Secondary | ICD-10-CM

## 2023-12-20 DIAGNOSIS — K5720 Diverticulitis of large intestine with perforation and abscess without bleeding: Principal | ICD-10-CM | POA: Diagnosis present

## 2023-12-20 DIAGNOSIS — Z7952 Long term (current) use of systemic steroids: Secondary | ICD-10-CM

## 2023-12-20 DIAGNOSIS — Z8679 Personal history of other diseases of the circulatory system: Secondary | ICD-10-CM

## 2023-12-20 DIAGNOSIS — K8050 Calculus of bile duct without cholangitis or cholecystitis without obstruction: Secondary | ICD-10-CM | POA: Diagnosis present

## 2023-12-20 DIAGNOSIS — M19011 Primary osteoarthritis, right shoulder: Secondary | ICD-10-CM | POA: Diagnosis present

## 2023-12-20 DIAGNOSIS — E1122 Type 2 diabetes mellitus with diabetic chronic kidney disease: Secondary | ICD-10-CM | POA: Diagnosis present

## 2023-12-20 DIAGNOSIS — Z8701 Personal history of pneumonia (recurrent): Secondary | ICD-10-CM

## 2023-12-20 LAB — CBC WITH DIFFERENTIAL/PLATELET
Abs Immature Granulocytes: 0.03 K/uL (ref 0.00–0.07)
Basophils Absolute: 0 K/uL (ref 0.0–0.1)
Basophils Relative: 0 %
Eosinophils Absolute: 0.1 K/uL (ref 0.0–0.5)
Eosinophils Relative: 1 %
HCT: 34.2 % — ABNORMAL LOW (ref 39.0–52.0)
Hemoglobin: 11.2 g/dL — ABNORMAL LOW (ref 13.0–17.0)
Immature Granulocytes: 0 %
Lymphocytes Relative: 10 %
Lymphs Abs: 0.8 K/uL (ref 0.7–4.0)
MCH: 28.5 pg (ref 26.0–34.0)
MCHC: 32.7 g/dL (ref 30.0–36.0)
MCV: 87 fL (ref 80.0–100.0)
Monocytes Absolute: 0.9 K/uL (ref 0.1–1.0)
Monocytes Relative: 12 %
Neutro Abs: 5.8 K/uL (ref 1.7–7.7)
Neutrophils Relative %: 77 %
Platelets: 170 K/uL (ref 150–400)
RBC: 3.93 MIL/uL — ABNORMAL LOW (ref 4.22–5.81)
RDW: 14.2 % (ref 11.5–15.5)
WBC: 7.6 K/uL (ref 4.0–10.5)
nRBC: 0 % (ref 0.0–0.2)

## 2023-12-20 LAB — URINALYSIS, ROUTINE W REFLEX MICROSCOPIC
Bacteria, UA: NONE SEEN
Bilirubin Urine: NEGATIVE
Glucose, UA: NEGATIVE mg/dL
Hgb urine dipstick: NEGATIVE
Ketones, ur: NEGATIVE mg/dL
Leukocytes,Ua: NEGATIVE
Nitrite: NEGATIVE
Protein, ur: 30 mg/dL — AB
Specific Gravity, Urine: 1.016 (ref 1.005–1.030)
pH: 5 (ref 5.0–8.0)

## 2023-12-20 LAB — COMPREHENSIVE METABOLIC PANEL WITH GFR
ALT: 40 U/L (ref 0–44)
AST: 32 U/L (ref 15–41)
Albumin: 3.5 g/dL (ref 3.5–5.0)
Alkaline Phosphatase: 312 U/L — ABNORMAL HIGH (ref 38–126)
Anion gap: 13 (ref 5–15)
BUN: 27 mg/dL — ABNORMAL HIGH (ref 8–23)
CO2: 18 mmol/L — ABNORMAL LOW (ref 22–32)
Calcium: 8.6 mg/dL — ABNORMAL LOW (ref 8.9–10.3)
Chloride: 105 mmol/L (ref 98–111)
Creatinine, Ser: 2.42 mg/dL — ABNORMAL HIGH (ref 0.61–1.24)
GFR, Estimated: 28 mL/min — ABNORMAL LOW (ref >60)
Glucose, Bld: 158 mg/dL — ABNORMAL HIGH (ref 70–99)
Potassium: 4 mmol/L (ref 3.5–5.1)
Sodium: 136 mmol/L (ref 135–145)
Total Bilirubin: 1 mg/dL (ref 0.0–1.2)
Total Protein: 6.7 g/dL (ref 6.5–8.1)

## 2023-12-20 LAB — CBG MONITORING, ED: Glucose-Capillary: 156 mg/dL — ABNORMAL HIGH (ref 70–99)

## 2023-12-20 LAB — LIPASE, BLOOD: Lipase: 19 U/L (ref 11–51)

## 2023-12-20 LAB — I-STAT CG4 LACTIC ACID, ED: Lactic Acid, Venous: 1.3 mmol/L (ref 0.5–1.9)

## 2023-12-20 MED ORDER — SODIUM CHLORIDE 0.9 % IV SOLN
2.0000 g | Freq: Once | INTRAVENOUS | Status: DC
  Filled 2023-12-20: qty 20

## 2023-12-20 MED ORDER — ACETAMINOPHEN 650 MG RE SUPP: 650.0000 mg | Freq: Four times a day (QID) | RECTAL | Status: DC | PRN

## 2023-12-20 MED ORDER — TICAGRELOR 90 MG PO TABS
90.0000 mg | ORAL_TABLET | Freq: Two times a day (BID) | ORAL | Status: DC
  Administered 2023-12-20 – 2023-12-26 (×18): 90 mg via ORAL
  Filled 2023-12-20 (×13): qty 1

## 2023-12-20 MED ORDER — PIPERACILLIN-TAZOBACTAM 3.375 G IVPB 30 MIN
3.3750 g | Freq: Once | INTRAVENOUS | Status: DC
  Administered 2023-12-20: 3.375 g via INTRAVENOUS
  Filled 2023-12-20: qty 50

## 2023-12-20 MED ORDER — SODIUM CHLORIDE 0.9% FLUSH
3.0000 mL | Freq: Two times a day (BID) | INTRAVENOUS | Status: DC
  Administered 2023-12-21 – 2023-12-26 (×13): 3 mL via INTRAVENOUS

## 2023-12-20 MED ORDER — OXYCODONE HCL 5 MG PO TABS
5.0000 mg | ORAL_TABLET | ORAL | Status: DC | PRN
  Administered 2023-12-22 – 2023-12-24 (×5): 5 mg via ORAL
  Filled 2023-12-20 (×3): qty 1

## 2023-12-20 MED ORDER — SERTRALINE HCL 100 MG PO TABS
100.0000 mg | ORAL_TABLET | Freq: Every day | ORAL | Status: DC
  Administered 2023-12-21 – 2023-12-26 (×9): 100 mg via ORAL
  Filled 2023-12-20 (×6): qty 1

## 2023-12-20 MED ORDER — PIPERACILLIN-TAZOBACTAM 3.375 G IVPB
3.3750 g | Freq: Three times a day (TID) | INTRAVENOUS | Status: DC
  Administered 2023-12-21 – 2023-12-25 (×23): 3.375 g via INTRAVENOUS
  Filled 2023-12-20 (×15): qty 50

## 2023-12-20 MED ORDER — PIPERACILLIN-TAZOBACTAM 3.375 G IVPB 30 MIN: 3.3750 g | Freq: Once | INTRAVENOUS | Status: DC

## 2023-12-20 MED ORDER — PROCHLORPERAZINE EDISYLATE 10 MG/2ML IJ SOLN
5.0000 mg | Freq: Four times a day (QID) | INTRAMUSCULAR | Status: DC | PRN
  Administered 2023-12-22 – 2023-12-23 (×8): 5 mg via INTRAVENOUS
  Filled 2023-12-20 (×5): qty 2

## 2023-12-20 MED ORDER — FENTANYL CITRATE PF 50 MCG/ML IJ SOSY
50.0000 ug | PREFILLED_SYRINGE | Freq: Once | INTRAMUSCULAR | Status: AC
  Administered 2023-12-20: 50 ug via INTRAVENOUS
  Filled 2023-12-20: qty 1

## 2023-12-20 MED ORDER — INSULIN ASPART 100 UNIT/ML IJ SOLN
0.0000 [IU] | INTRAMUSCULAR | Status: DC
  Administered 2023-12-20 – 2023-12-21 (×2): 1 [IU] via SUBCUTANEOUS
  Administered 2023-12-21 (×2): 3 [IU] via SUBCUTANEOUS
  Administered 2023-12-21 – 2023-12-24 (×14): 1 [IU] via SUBCUTANEOUS

## 2023-12-20 MED ORDER — HYDROCORTISONE 20 MG PO TABS
20.0000 mg | ORAL_TABLET | Freq: Every morning | ORAL | Status: DC
  Administered 2023-12-21 – 2023-12-22 (×4): 20 mg via ORAL
  Filled 2023-12-20 (×3): qty 1

## 2023-12-20 MED ORDER — SIROLIMUS 0.5 MG PO TABS
1.0000 mg | ORAL_TABLET | Freq: Every day | ORAL | Status: DC
  Administered 2023-12-21 – 2023-12-26 (×9): 1 mg via ORAL
  Filled 2023-12-20 (×7): qty 2

## 2023-12-20 MED ORDER — SODIUM CHLORIDE 0.9 % IV SOLN: INTRAVENOUS | Status: DC

## 2023-12-20 MED ORDER — ISAVUCONAZONIUM SULFATE 186 MG PO CAPS
372.0000 mg | ORAL_CAPSULE | Freq: Every day | ORAL | Status: DC
  Administered 2023-12-21 – 2023-12-25 (×8): 372 mg via ORAL
  Filled 2023-12-20 (×7): qty 2

## 2023-12-20 MED ORDER — MYCOPHENOLATE MOFETIL 250 MG PO CAPS
250.0000 mg | ORAL_CAPSULE | Freq: Two times a day (BID) | ORAL | Status: DC
  Administered 2023-12-20 – 2023-12-26 (×18): 250 mg via ORAL
  Filled 2023-12-20 (×13): qty 1

## 2023-12-20 MED ORDER — METOPROLOL SUCCINATE ER 25 MG PO TB24
25.0000 mg | ORAL_TABLET | Freq: Every day | ORAL | Status: DC
  Administered 2023-12-21 – 2023-12-26 (×9): 25 mg via ORAL
  Filled 2023-12-20 (×6): qty 1

## 2023-12-20 MED ORDER — HYDROCORTISONE 10 MG PO TABS
10.0000 mg | ORAL_TABLET | Freq: Every day | ORAL | Status: DC
  Administered 2023-12-21 (×2): 10 mg via ORAL
  Filled 2023-12-20 (×2): qty 1

## 2023-12-20 MED ORDER — MEXILETINE HCL 250 MG PO CAPS
250.0000 mg | ORAL_CAPSULE | Freq: Two times a day (BID) | ORAL | Status: DC
  Administered 2023-12-20 – 2023-12-26 (×18): 250 mg via ORAL
  Filled 2023-12-20 (×14): qty 1

## 2023-12-20 MED ORDER — HYDROMORPHONE HCL 1 MG/ML IJ SOLN
0.5000 mg | INTRAMUSCULAR | Status: DC | PRN
  Administered 2023-12-20 – 2023-12-22 (×19): 1 mg via INTRAVENOUS
  Filled 2023-12-20 (×10): qty 1

## 2023-12-20 MED ORDER — METRONIDAZOLE 500 MG/100ML IV SOLN
500.0000 mg | Freq: Once | INTRAVENOUS | Status: DC
  Filled 2023-12-20: qty 100

## 2023-12-20 MED ORDER — ASPIRIN 81 MG PO CHEW
81.0000 mg | CHEWABLE_TABLET | Freq: Every day | ORAL | Status: DC
  Administered 2023-12-21 – 2023-12-26 (×9): 81 mg via ORAL
  Filled 2023-12-20 (×6): qty 1

## 2023-12-20 MED ORDER — SODIUM CHLORIDE 0.9 % IV BOLUS
500.0000 mL | Freq: Once | INTRAVENOUS | Status: AC
  Administered 2023-12-20: 500 mL via INTRAVENOUS

## 2023-12-20 MED ORDER — ACETAMINOPHEN 325 MG PO TABS
650.0000 mg | ORAL_TABLET | Freq: Four times a day (QID) | ORAL | Status: DC | PRN
Start: 2023-12-20 — End: 2023-12-26
  Administered 2023-12-21 (×2): 650 mg via ORAL
  Filled 2023-12-20 (×2): qty 2

## 2023-12-20 MED ORDER — PANTOPRAZOLE SODIUM 40 MG PO TBEC
40.0000 mg | DELAYED_RELEASE_TABLET | Freq: Every day | ORAL | Status: DC
  Administered 2023-12-21 – 2023-12-22 (×4): 40 mg via ORAL
  Filled 2023-12-20 (×2): qty 1

## 2023-12-20 MED ORDER — SODIUM CHLORIDE 0.9 % IV SOLN: 1.0000 g | Freq: Once | INTRAVENOUS | Status: DC

## 2023-12-20 MED ORDER — ONDANSETRON HCL 4 MG/2ML IJ SOLN
4.0000 mg | Freq: Once | INTRAMUSCULAR | Status: AC
  Administered 2023-12-20: 4 mg via INTRAVENOUS
  Filled 2023-12-20: qty 2

## 2023-12-20 MED ORDER — HYDROCORTISONE 10 MG PO TABS
10.0000 mg | ORAL_TABLET | Freq: Every day | ORAL | Status: DC
  Administered 2023-12-21 – 2023-12-22 (×4): 10 mg via ORAL
  Filled 2023-12-20 (×2): qty 1

## 2023-12-20 MED ORDER — PIPERACILLIN-TAZOBACTAM 3.375 G IVPB: 3.3750 g | Freq: Three times a day (TID) | INTRAVENOUS | Status: DC

## 2023-12-20 NOTE — H&P (Signed)
 History and Physical    Randall Frazier FMW:[id number] DOB: 1955-07-11 DOA: 12/20/2023  PCP: [facility]   Patient coming from: Home   Chief Complaint: Abdominal pain   HPI: PAULETTE Frazier is a 68 y.o. 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  Patient reports roughly 3 days of abdominal pain which has become severe.  He denies vomiting, melena, or hematochezia.  He is not experiencing any chest pain or shortness of breath.  ED Course: Upon arrival to the ED, patient is found to be afebrile and saturating well on room air with normal HR and stable BP.  Labs are most notable for normal lactate, normal WBC, and creatinine 2.42.  CT is concerning for acute sigmoid diverticulitis with microperforation.  Surgery was consulted by the ED PA and the patient was treated with 500 mL of NS, Zofran , and fentanyl .  Review of Systems:  All other systems reviewed and apart from HPI, are negative.  Past Medical History:  Diagnosis Date   Arthritis    shoulder- both, neck, spine, GOUT   Diabetes mellitus    Gout    H/O exercise stress test    many yrs. 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 weight loss -over 18 mon. period     Past Surgical History:  Procedure Laterality Date   ANTERIOR CERVICAL DECOMP/DISCECTOMY FUSION N/A 12/20/2012   Procedure: C5-6, C6-7 ANTERIOR CERVICAL DISCECTOMY AND FUSION, ALLOGRAFT, PLATE;  Surgeon: Oneil JAYSON Herald, MD;  Location: [facility] OR;  Service: Orthopedics;  Laterality: N/A;   BACK SURGERY     CORONARY/GRAFT ACUTE MI REVASCULARIZATION N/A 11/07/2023    Procedure: Coronary/Graft Acute MI Revascularization;  Surgeon: Wonda Sharper, MD;  Location: [facility] INVASIVE CV LAB;  Service: Cardiovascular;  Laterality: N/A;   ESOPHAGOGASTRODUODENOSCOPY (EGD) WITH PROPOFOL  Left 12/16/2019   Procedure: ESOPHAGOGASTRODUODENOSCOPY (EGD) WITH PROPOFOL ;  Surgeon: Dianna Specking, MD;  Location: [facility] ENDOSCOPY;  Service: Endoscopy;  Laterality: Left;   ESOPHAGOGASTRODUODENOSCOPY (EGD) WITH PROPOFOL  N/A 02/16/2021   Procedure: ESOPHAGOGASTRODUODENOSCOPY (EGD) WITH PROPOFOL ;  Surgeon: Rollin Dover, MD;  Location: [facility] ENDOSCOPY;  Service: Endoscopy;  Laterality: N/A;   HOT HEMOSTASIS N/A 02/16/2021   Procedure: HOT HEMOSTASIS (ARGON PLASMA COAGULATION/BICAP);  Surgeon: Rollin Dover, MD;  Location: [facility] ENDOSCOPY;  Service: Endoscopy;  Laterality: N/A;   KNEE SURGERY     osgood slaughter syndrome , 18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 He has never used smokeless tobacco. He reports current alcohol  use. He reports that he does not use drugs.  Allergies  Allergen Reactions   Bromsite [Bromfenac Sodium] Other (See Comments)    Kidney disorder; serum creatinine raised.   Desyrel  [Trazodone ] Nausea Only   Lotensin [Benazepril] Cough   Mobic [Meloxicam]  Other (See Comments)    Hx liver transplant   Neurontin [Gabapentin] Swelling   Nsaids Other (See Comments)    Kidney Disorder; serum creatinine above reference range   Pork Allergy Other (See Comments)    Gout flares   Pravachol [Pravastatin] Other (See Comments)    Liver enzymes abnormal; outside reference range Myalgias   Statins Other (See Comments)    Liver enzymes abnormal; outside reference range  Myalgias Tried on Pravachol and Zocor    Toradol  [Ketorolac  Tromethamine ] Other (See Comments)    Kidney disorder; serum creatinine above reference range   Zocor [Simvastatin] Other (See Comments)    Liver enzymes abnormal; outside reference range Myalgias   Niaspan [Niacin] Other (See Comments)    Vasomotor symptoms   Zestril [Lisinopril] Swelling, Dermatitis, Rash and Other (See Comments)    Family History  Problem Relation Age of Onset   Liver disease Sister    Diabetes Neg Hx      Prior to Admission medications   Medication Sig Start Date End Date Taking? Authorizing Provider  Alirocumab  (PRALUENT ) 150 MG/ML SOAJ Inject 1 mL (150 mg total) into the skin every 14 (fourteen) days. 11/30/23   Meng, Hao, PA  aspirin  81 MG chewable tablet Chew 81 mg by mouth daily.    [provider]  ezetimibe  (ZETIA ) 10 MG tablet Take 1 tablet (10 mg total) by mouth daily. 11/23/23   Meng, Hao, PA  hydrocortisone  (CORTEF ) 10 MG tablet Take 5-10 mg by mouth See admin instructions. Take 1 tablet (10mg ) by mouth in the morning and take 1/2 tablet (5mg ) at bedtime.    [provider]  Isavuconazonium Sulfate  (CRESEMBA ) 186 MG CAPS Take 372 mg by mouth daily.    [provider]  metoprolol  succinate (TOPROL -XL) 25 MG 24 hr tablet Take 1 tablet (25 mg total) by mouth daily. 11/23/23   Meng, Hao, PA  mexiletine (MEXITIL ) 250 MG capsule Take 1 capsule (250 mg total) by mouth every 12 (twelve) hours. 11/11/23   Rolan Ezra RAMAN, MD  mycophenolate  (CELLCEPT ) 250 MG capsule Take 250 mg by mouth 2 (two) times daily.    [provider]  nitroGLYCERIN  (NITROSTAT ) 0.4 MG SL tablet Place 1 tablet (0.4 mg total) under the tongue every 5 (five) minutes x 3 doses as needed for chest pain. 11/11/23   Rolan Ezra RAMAN, MD  omega-3 acid ethyl esters (LOVAZA ) 1 g capsule Take 2 capsules (2 g total) by mouth 2 (two) times daily. 12/04/23   Cooper, Michael, MD  omeprazole (PRILOSEC) 40 MG capsule Take 40 mg by mouth daily.    [provider]  sertraline  (ZOLOFT ) 100 MG tablet Take 100 mg by mouth daily.    [provider]  sirolimus  (RAPAMUNE ) 1 MG tablet Take 1 mg by mouth daily.    [provider]  ticagrelor  (BRILINTA ) 90 MG TABS tablet Take 1 tablet (90 mg total) by mouth 2 (two) times daily. 11/23/23   Janene Boer, [place]    Physical Exam: Vitals:   12/20/23 1635 12/20/23 1636 12/20/23 1900 12/20/23 2103  BP: (!) 141/79  (!) 147/69   Pulse: 81  87   Resp: 13  13   Temp: 99.8 F (37.7 C)   (!) 101.8 F (38.8 C)  TempSrc: Oral   Oral  SpO2: 99%  100%   Weight:  92.5 kg    Height:  6' (1.829 m)      Constitutional: NAD, no pallor or diaphoresis  Eyes: PERTLA, lids and conjunctivae normal ENMT: Mucous membranes are moist. Posterior pharynx clear of any exudate or lesions.   Neck: supple, no masses  Respiratory: no wheezing, no crackles. No accessory muscle use.  Cardiovascular: S1 & S2 heard, regular rate and rhythm. No JVD.  Abdomen: Soft, LLQ tenderness. Bowel sounds active.  Musculoskeletal: no clubbing / cyanosis. No joint deformity upper and lower extremities.   Skin: no significant rashes, lesions, ulcers. Warm, dry, well-perfused. Neurologic: CN 2-12 grossly intact. Moving all extremities. Alert and oriented.  Psychiatric: Calm. Cooperative.    Labs and Imaging on Admission: I have personally reviewed following labs and imaging studies  CBC: Recent Labs  Lab 12/20/23 1705  WBC 7.6  NEUTROABS 5.8  HGB 11.2*  HCT 34.2*  MCV 87.0  PLT 170   Basic Metabolic Panel: Recent Labs  Lab 12/20/23 1638  NA 136  K 4.0  CL 105  CO2 18*  GLUCOSE 158*  BUN 27*  CREATININE 2.42*  CALCIUM  8.6*   GFR: Estimated Creatinine Clearance: 32.1 mL/min (A) (by C-G formula based on SCr of 2.42 mg/dL (H)). Liver Function Tests: Recent Labs  Lab 12/20/23 1638  AST 32  ALT 40  ALKPHOS 312*  BILITOT 1.0  PROT 6.7  ALBUMIN  3.5   Recent Labs  Lab 12/20/23 1638  LIPASE 19   No  results for input(s): AMMONIA in the last 168 hours. Coagulation Profile: No results for input(s): INR, PROTIME in the last 168 hours. Cardiac Enzymes: No results for input(s): CKTOTAL, CKMB, CKMBINDEX, TROPONINI in the last 168 hours. BNP (last 3 results) No results for input(s): PROBNP in the last 8760 hours. HbA1C: No results for input(s): HGBA1C in the last 72 hours. CBG: No results for input(s): GLUCAP in the last 168 hours. Lipid Profile: No results for input(s): CHOL, HDL, LDLCALC, TRIG, CHOLHDL, LDLDIRECT in the last 72 hours. Thyroid Function Tests: No results for input(s): TSH, T4TOTAL, FREET4, T3FREE, THYROIDAB in the last 72 hours. Anemia Panel: No results for input(s): VITAMINB12, FOLATE, FERRITIN, TIBC, IRON , RETICCTPCT in the last 72 hours. Urine analysis:    Component Value Date/Time   COLORURINE YELLOW 12/20/2023 1913   APPEARANCEUR CLEAR 12/20/2023 1913   LABSPEC 1.016 12/20/2023 1913   PHURINE 5.0 12/20/2023 1913   GLUCOSEU NEGATIVE 12/20/2023 1913   HGBUR NEGATIVE 12/20/2023 1913   BILIRUBINUR NEGATIVE 12/20/2023 1913   KETONESUR NEGATIVE 12/20/2023 1913   PROTEINUR 30 (A) 12/20/2023 1913   UROBILINOGEN 1.0 12/16/2012 1114   NITRITE NEGATIVE 12/20/2023 1913   LEUKOCYTESUR NEGATIVE 12/20/2023 1913   Sepsis Labs: @LABRCNTIP (procalcitonin:4,lacticidven:4) )No results found for this or any previous visit (from the past 240 hours).   Radiological Exams on Admission: CT ABDOMEN PELVIS WO CONTRAST Result Date: 12/20/2023 CLINICAL DATA:  Acute abdominal pain EXAM: CT ABDOMEN AND PELVIS WITHOUT CONTRAST TECHNIQUE: Multidetector CT imaging of the abdomen and pelvis was performed following the standard protocol without IV contrast. RADIATION DOSE REDUCTION: This exam was performed according to the departmental dose-optimization program which includes automated exposure control, adjustment of the mA and/or kV  according to patient size and/or use of iterative reconstruction technique. COMPARISON:  None Available. FINDINGS: Lower chest: No acute abnormality. Hepatobiliary: No focal liver abnormality is seen. There surgical clips in the region of the IVC and portal vein. Status post cholecystectomy. No biliary dilatation. Pancreas: Unremarkable. No pancreatic ductal dilatation or surrounding inflammatory changes. Spleen: Normal in size without focal abnormality. Adrenals/Urinary Tract: Adrenal glands are unremarkable. Kidneys are normal, without renal  calculi, focal lesion, or hydronephrosis. Bladder is unremarkable. Stomach/Bowel: There is moderate length segment of sigmoid colon wall thickening with marked surrounding inflammatory stranding. A few diverticula are seen in this region. Micro perforation present image 6/59. No focal abscess. No bowel obstruction. Appendix is within normal limits. Small bowel and stomach are within normal limits. Vascular/Lymphatic: Aortic atherosclerosis. No enlarged abdominal or pelvic lymph nodes. Reproductive: Prostate is unremarkable. Other: There is a small amount of free fluid in the pelvis. There is a small fat containing umbilical hernia. Musculoskeletal: Sternotomy wires are present. No acute fractures are seen. IMPRESSION: 1. Acute sigmoid colon diverticulitis versus colitis with micro perforation. No focal abscess. 2. Small amount of free fluid in the pelvis. Aortic Atherosclerosis (ICD10-I70.0). Electronically Signed   By: Greig Pique M.D.   On: 12/20/2023 19:41    EKG: Independently reviewed. Sinus rhythm, non-specific IVCD.   Assessment/Plan   1. Acute sigmoid diverticulitis with microperforation  - Continue IV antibiotics, bowel rest, supportive care, and follow-up on surgeon's recommendations    2. CAD  - Hx of CABG, STEMI treated with PCI/DES x1 on 11/07/23  - Continue ASA and Brilinta  if okay with surgery, continue metoprolol     3. Liver transplant recipient    - Continue mycophenolate , sirolimus , Cresemba     4. VT - Monomorphic VT noted following his STEMI  - Continue Mexitil , Toprol    5. CKD IV  - Appears close to baseline  - Renally-dose medications    6. Adrenal insufficiency  - Continue Cortef , increase dose for now in setting of acute illness   7. Type II DM  - Check CBGs and use low-intensity SSI for now   8. Depression  - Zoloft      DVT prophylaxis: SCDs  Code Status: Full  Level of Care: Level of care: Telemetry Medical Family Communication: wife at bedside  Disposition Plan Patient is from: Home  Anticipated d/c is to: Home  Anticipated d/c date is: 12/23/23  Patient currently: Pending clinical improvement, advancement of diet, transition to oral medications   Consults called: Surgery  Admission status: Inpatient     Evalene GORMAN Sprinkles, MD [facility]  12/20/2023, 9:15 PM

## 2023-12-20 NOTE — ED Provider Notes (Signed)
 [place] EMERGENCY DEPARTMENT AT [facility] Provider Note   CSN: [id number] Arrival date & time: 12/20/23  [id number]     Patient presents with: Abdominal Pain   Randall Frazier is a 68 y.o. male.   68 year old male with past medical history significant for CAD status post CABG and recent STEMI status post PCI, liver transplant which was done at [facility] presents today for concern of acute abdominal pain.  Pain started today.  No associated nausea or vomiting. Wife who I spoke with later also states that patient had an obstructing stone in his liver but currently waiting for determining timing with cardiology to allow him to come off of his antiplatelets to allow for surgery for the stone removal.  He primarily follows at [place] at [facility]  The history is provided by the patient and the spouse. No language interpreter was used.       Prior to Admission medications   Medication Sig Start Date End Date Taking? Authorizing Provider  Alirocumab  (PRALUENT ) 150 MG/ML SOAJ Inject 1 mL (150 mg total) into the skin every 14 (fourteen) days. 11/30/23   Meng, Hao, PA  aspirin  81 MG chewable tablet Chew 81 mg by mouth daily.    [provider]  ezetimibe  (ZETIA ) 10 MG tablet Take 1 tablet (10 mg total) by mouth daily. 11/23/23   Meng, Hao, PA  hydrocortisone  (CORTEF ) 10 MG tablet Take 5-10 mg by mouth See admin instructions. Take 1 tablet (10mg ) by mouth in the morning and take 1/2 tablet (5mg ) at bedtime.    [provider]  Isavuconazonium Sulfate  (CRESEMBA ) 186 MG CAPS Take 372 mg by mouth daily.    [provider]  metoprolol  succinate (TOPROL -XL) 25 MG 24 hr tablet Take 1 tablet (25 mg total) by mouth daily. 11/23/23   Meng, Hao, PA  mexiletine (MEXITIL ) 250 MG capsule Take 1 capsule (250 mg total) by mouth every 12 (twelve) hours. 11/11/23   Rolan Ezra RAMAN, MD  mycophenolate  (CELLCEPT ) 250 MG capsule Take 250 mg by mouth 2 (two) times daily.    [provider]   nitroGLYCERIN  (NITROSTAT ) 0.4 MG SL tablet Place 1 tablet (0.4 mg total) under the tongue every 5 (five) minutes x 3 doses as needed for chest pain. 11/11/23   Rolan Ezra RAMAN, MD  omega-3 acid ethyl esters (LOVAZA ) 1 g capsule Take 2 capsules (2 g total) by mouth 2 (two) times daily. 12/04/23   Cooper, Michael, MD  omeprazole (PRILOSEC) 40 MG capsule Take 40 mg by mouth daily.    [provider]  sertraline  (ZOLOFT ) 100 MG tablet Take 100 mg by mouth daily.    [provider]  sirolimus  (RAPAMUNE ) 1 MG tablet Take 1 mg by mouth daily.    [provider]  ticagrelor  (BRILINTA ) 90 MG TABS tablet Take 1 tablet (90 mg total) by mouth 2 (two) times daily. 11/23/23   Meng, Hao, PA    Allergies: Bromsite [bromfenac sodium], Desyrel  [trazodone ], Lotensin [benazepril], Mobic [meloxicam], Neurontin [gabapentin], Nsaids, Pork allergy, Pravachol [pravastatin], Statins, Toradol  [ketorolac  tromethamine ], Zocor [simvastatin], Niaspan [niacin], and Zestril [lisinopril]    Review of Systems  Constitutional:  Negative for chills and fever.  Respiratory:  Negative for shortness of breath.   Cardiovascular:  Negative for chest pain.  Gastrointestinal:  Positive for abdominal pain. Negative for nausea and vomiting.  Neurological:  Negative for light-headedness.  All other systems reviewed and are negative.   Updated Vital Signs BP (!) 147/69  Pulse 87   Temp 99.8 F (37.7 C) (Oral)   Resp 13   Ht 6' (1.829 m)   Wt 92.5 kg   SpO2 100%   BMI 27.67 kg/m   Physical Exam Vitals and nursing note reviewed.  Constitutional:      General: He is not in acute distress.    Appearance: Normal appearance. He is not ill-appearing.  HENT:     Head: Normocephalic and atraumatic.     Nose: Nose normal.  Eyes:     Conjunctiva/sclera: Conjunctivae normal.  Cardiovascular:     Rate and Rhythm: Normal rate and regular rhythm.  Pulmonary:     Effort: Pulmonary effort is normal. No  respiratory distress.  Abdominal:     General: There is no distension.     Palpations: Abdomen is soft.     Tenderness: There is abdominal tenderness (Tenderness in the right lower quadrant). There is no guarding.  Musculoskeletal:        General: No deformity. Normal range of motion.     Cervical back: Normal range of motion.     Right lower leg: No edema.     Left lower leg: No edema.  Skin:    Findings: No rash.  Neurological:     Mental Status: He is alert.     (all labs ordered are listed, but only abnormal results are displayed) Labs Reviewed  COMPREHENSIVE METABOLIC PANEL WITH GFR - Abnormal; Notable for the following components:      Result Value   CO2 18 (*)    Glucose, Bld 158 (*)    BUN 27 (*)    Creatinine, Ser 2.42 (*)    Calcium  8.6 (*)    Alkaline Phosphatase 312 (*)    GFR, Estimated 28 (*)    All other components within normal limits  URINALYSIS, ROUTINE W REFLEX MICROSCOPIC - Abnormal; Notable for the following components:   Protein, ur 30 (*)    All other components within normal limits  CBC WITH DIFFERENTIAL/PLATELET - Abnormal; Notable for the following components:   RBC 3.93 (*)    Hemoglobin 11.2 (*)    HCT 34.2 (*)    All other components within normal limits  LIPASE, BLOOD  I-STAT CG4 LACTIC ACID, ED    EKG: EKG Interpretation Date/Time:  Sunday December 20 2023 16:45:09 EDT Ventricular Rate:  84 PR Interval:  166 QRS Duration:  119 QT Interval:  415 QTC Calculation: 491 R Axis:   -11  Text Interpretation: Sinus rhythm Probable left atrial enlargement Nonspecific intraventricular conduction delay Inferior infarct, old No significant change since last tracing Confirmed by Charlyn Sora [phone]) on 12/20/2023 7:07:33 PM  Radiology: CT ABDOMEN PELVIS WO CONTRAST Result Date: 12/20/2023 CLINICAL DATA:  Acute abdominal pain EXAM: CT ABDOMEN AND PELVIS WITHOUT CONTRAST TECHNIQUE: Multidetector CT imaging of the abdomen and pelvis was performed  following the standard protocol without IV contrast. RADIATION DOSE REDUCTION: This exam was performed according to the departmental dose-optimization program which includes automated exposure control, adjustment of the mA and/or kV according to patient size and/or use of iterative reconstruction technique. COMPARISON:  None Available. FINDINGS: Lower chest: No acute abnormality. Hepatobiliary: No focal liver abnormality is seen. There surgical clips in the region of the IVC and portal vein. Status post cholecystectomy. No biliary dilatation. Pancreas: Unremarkable. No pancreatic ductal dilatation or surrounding inflammatory changes. Spleen: Normal in size without focal abnormality. Adrenals/Urinary Tract: Adrenal glands are unremarkable. Kidneys are normal, without renal calculi, focal lesion, or  hydronephrosis. Bladder is unremarkable. Stomach/Bowel: There is moderate length segment of sigmoid colon wall thickening with marked surrounding inflammatory stranding. A few diverticula are seen in this region. Micro perforation present image 6/59. No focal abscess. No bowel obstruction. Appendix is within normal limits. Small bowel and stomach are within normal limits. Vascular/Lymphatic: Aortic atherosclerosis. No enlarged abdominal or pelvic lymph nodes. Reproductive: Prostate is unremarkable. Other: There is a small amount of free fluid in the pelvis. There is a small fat containing umbilical hernia. Musculoskeletal: Sternotomy wires are present. No acute fractures are seen. IMPRESSION: 1. Acute sigmoid colon diverticulitis versus colitis with micro perforation. No focal abscess. 2. Small amount of free fluid in the pelvis. Aortic Atherosclerosis (ICD10-I70.0). Electronically Signed   By: Greig Pique M.D.   On: 12/20/2023 19:41     Procedures   Medications Ordered in the ED  fentaNYL  (SUBLIMAZE ) injection 50 mcg (50 mcg Intravenous Given 12/20/23 1714)  ondansetron  (ZOFRAN ) injection 4 mg (4 mg Intravenous  Given 12/20/23 1714)  sodium chloride  0.9 % bolus 500 mL (0 mLs Intravenous Stopped 12/20/23 1834)    Clinical Course as of 12/20/23 2022  Sun Dec 20, 2023  2014 Patient received Zosyn .  This was initially ordered since he is immunocompromise and has a perforation with his diverticulitis.  Per our ED order set this was the preferred regimen.  Spoke to surgeon who preferred Rocephin  and Flagyl .  However he already received Zosyn .  Will discontinue and use Rocephin  and Flagyl  for now. [AA]    Clinical Course User Index [AA] Hildegard Loge, PA-C                                 Medical Decision Making Amount and/or Complexity of Data Reviewed Labs: ordered. Radiology: ordered.  Risk Prescription drug management. Decision regarding hospitalization.   Medical Decision Making / ED Course   This patient presents to the ED for concern of abdominal pain, this involves an extensive number of treatment options, and is a complaint that carries with it a high risk of complications and morbidity.  The differential diagnosis includes diverticulitis, colitis, biliary obstruction, pancreatitis, UTI, pyelonephritis, nephrolithiasis  MDM: 68 year old male presents with abdominal pain.  He has a history of CAD with recent STEMI and prior CABG, and history of liver transplant.  Exquisitely tender on exam. Pain control ordered. Labs ordered as well as CT abdomen pelvis.  Given his CKD this was obtained as a Noncon study.  CBC without leukocytosis.  Hemoglobin at patient's baseline.  UA without evidence of UTI.  Lipase within normal.  CMP with creatinine of 2.42 which is chronic, alk phos of 312 but otherwise no abnormalities with his LFTs.  CT abdomen pelvis shows acute diverticulitis or colitis with microperforation. Spoke with general surgery and they recommend admission and they will consult.  Discussed with hospitalist will evaluate patient for admission.   Additional history obtained: -Additional  history obtained from patient's wife who was at bedside -External records from outside source obtained and reviewed including: Chart review including previous notes, labs, imaging, consultation notes   Lab Tests: -I ordered, reviewed, and interpreted labs.   The pertinent results include:   Labs Reviewed  COMPREHENSIVE METABOLIC PANEL WITH GFR - Abnormal; Notable for the following components:      Result Value   CO2 18 (*)    Glucose, Bld 158 (*)    BUN 27 (*)    Creatinine,  Ser 2.42 (*)    Calcium  8.6 (*)    Alkaline Phosphatase 312 (*)    GFR, Estimated 28 (*)    All other components within normal limits  URINALYSIS, ROUTINE W REFLEX MICROSCOPIC - Abnormal; Notable for the following components:   Protein, ur 30 (*)    All other components within normal limits  CBC WITH DIFFERENTIAL/PLATELET - Abnormal; Notable for the following components:   RBC 3.93 (*)    Hemoglobin 11.2 (*)    HCT 34.2 (*)    All other components within normal limits  LIPASE, BLOOD  I-STAT CG4 LACTIC ACID, ED      EKG  EKG Interpretation Date/Time:  Sunday December 20 2023 16:45:09 EDT Ventricular Rate:  84 PR Interval:  166 QRS Duration:  119 QT Interval:  415 QTC Calculation: 491 R Axis:   -11  Text Interpretation: Sinus rhythm Probable left atrial enlargement Nonspecific intraventricular conduction delay Inferior infarct, old No significant change since last tracing Confirmed by Charlyn Sora [phone]) on 12/20/2023 7:07:33 PM         Imaging Studies ordered: I ordered imaging studies including CT abdomen pelvis without contrast I independently visualized and interpreted imaging. I agree with the radiologist interpretation   Medicines ordered and prescription drug management: Meds ordered this encounter  Medications   fentaNYL  (SUBLIMAZE ) injection 50 mcg   ondansetron  (ZOFRAN ) injection 4 mg   sodium chloride  0.9 % bolus 500 mL   DISCONTD: piperacillin -tazobactam (ZOSYN ) IVPB 3.375 g     Antibiotic Indication::   Intra-abdominal Infection   DISCONTD: cefTRIAXone  (ROCEPHIN ) 1 g in sodium chloride  0.9 % 100 mL IVPB    Antibiotic Indication::   Intra-abdominal   DISCONTD: metroNIDAZOLE  (FLAGYL ) IVPB 500 mg    Antibiotic Indication::   Intra-abdominal Infection   DISCONTD: cefTRIAXone  (ROCEPHIN ) 2 g in sodium chloride  0.9 % 100 mL IVPB    Antibiotic Indication::   Intra-abdominal    -I have reviewed the patients home medicines and have made adjustments as needed  Critical interventions Surgery consult, IV antibiotics  Consultations Obtained: I requested consultation with the general surgery,  and discussed lab and imaging findings as well as pertinent plan - they recommend: As above   Reevaluation: After the interventions noted above, I reevaluated the patient and found that they have :improved  Co morbidities that complicate the patient evaluation  Past Medical History:  Diagnosis Date   Arthritis    shoulder- both, neck, spine, GOUT   Diabetes mellitus    Gout    H/O exercise stress test    many yrs. 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 weight loss -over 18 mon. period       Dispostion: Discussed with hospitalist.  They will evaluate patient for admission.  Final diagnoses:  Diverticulitis of colon with perforation    ED Discharge Orders     None          Hildegard Loge, DEVONNA 12/20/23 2022    Charlyn Sora, MD 12/20/23 2159

## 2023-12-20 NOTE — ED Notes (Signed)
 Patient asking pain meds. EPD notified

## 2023-12-20 NOTE — ED Triage Notes (Signed)
 Patient bib EMS from home with complaints of 10/10 abdominal pain. EMS reports it is tender to touch, and he states it feels like something is sitting on it. He had a CABG in the past also a STEMI 5 weeks ago with stents placed. Denies chest pain. Reports he has been having bowel movements normally but some diarrhea.   EMS gave 100mcg of fentanyl  enroute which help pain slightly now says it is 7/10.

## 2023-12-20 NOTE — Consult Note (Signed)
 Randall Frazier 1955/11/27  [id number].    Requesting MD: Opyd Chief Complaint/Reason for Consult: Diverticulitis w/ perforation  HPI:  68 y/o M w/ a hx of liver transplant 3 years ago for FLD, DM, CKD, HTN, and CAD s/p recent DES for STEMI 6/28 on ASA/Brilinta  w/ reduced EF (40-45%) p/w abdominal pain with CT showing diverticulitis w/ microperforation.  On exam, patient is resting in bed. NAD. He reports 3 days of pain prior to arrival. He had diarrhea a few days ago but no BM since. WBC 8, Hb 11.2. He is AF and HDS. HR 80s-90s. He reports that he had a colonoscopy prior to his transplant and denies any abnormalities.   ROS: Review of Systems  Constitutional: Negative.   HENT: Negative.    Eyes: Negative.   Respiratory: Negative.    Cardiovascular: Negative.   Gastrointestinal:  Positive for abdominal pain.  Genitourinary: Negative.   Musculoskeletal: Negative.   Skin: Negative.   Neurological: Negative.   Endo/Heme/Allergies: Negative.   Psychiatric/Behavioral: Negative.      Family History  Problem Relation Age of Onset   Liver disease Sister    Diabetes Neg Hx     Past Medical History:  Diagnosis Date   Arthritis    shoulder- both, neck, spine, GOUT   Diabetes mellitus    Gout    H/O exercise stress test    many yrs. 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 weight loss -over 18 mon. period     Past Surgical History:  Procedure Laterality Date   ANTERIOR CERVICAL DECOMP/DISCECTOMY FUSION N/A 12/20/2012   Procedure: C5-6, C6-7 ANTERIOR CERVICAL DISCECTOMY AND FUSION, ALLOGRAFT, PLATE;  Surgeon: Oneil JAYSON Herald, MD;  Location: [facility] OR;  Service: Orthopedics;  Laterality: N/A;   BACK SURGERY     CORONARY/GRAFT ACUTE MI REVASCULARIZATION N/A 11/07/2023   Procedure: Coronary/Graft Acute MI Revascularization;   Surgeon: Wonda Sharper, MD;  Location: [facility] INVASIVE CV LAB;  Service: Cardiovascular;  Laterality: N/A;   ESOPHAGOGASTRODUODENOSCOPY (EGD) WITH PROPOFOL  Left 12/16/2019   Procedure: ESOPHAGOGASTRODUODENOSCOPY (EGD) WITH PROPOFOL ;  Surgeon: Dianna Specking, MD;  Location: [facility] ENDOSCOPY;  Service: Endoscopy;  Laterality: Left;   ESOPHAGOGASTRODUODENOSCOPY (EGD) WITH PROPOFOL  N/A 02/16/2021   Procedure: ESOPHAGOGASTRODUODENOSCOPY (EGD) WITH PROPOFOL ;  Surgeon: Rollin Dover, MD;  Location: [facility] ENDOSCOPY;  Service: Endoscopy;  Laterality: N/A;   HOT HEMOSTASIS N/A 02/16/2021   Procedure: HOT HEMOSTASIS (ARGON PLASMA COAGULATION/BICAP);  Surgeon: Rollin Dover, MD;  Location: [facility] ENDOSCOPY;  Service: Endoscopy;  Laterality: N/A;   KNEE SURGERY     osgood slaughter syndrome , 18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 He has never used smokeless tobacco. He reports current alcohol  use. He reports that he does not use drugs.  Allergies:  Allergies  Allergen Reactions   Bromsite [Bromfenac Sodium] Other (See Comments)    Kidney disorder; serum creatinine raised.   Desyrel  [Trazodone ] Nausea Only   Lotensin [Benazepril] Cough  Mobic [Meloxicam] Other (See Comments)    Hx liver transplant   Neurontin [Gabapentin] Swelling   Nsaids Other (See Comments)    Kidney Disorder; serum creatinine above reference range   Pork Allergy Other (See Comments)    Gout flares   Pravachol [Pravastatin] Other (See Comments)    Liver enzymes abnormal; outside reference range Myalgias   Statins Other (See Comments)    Liver enzymes abnormal; outside reference range  Myalgias Tried on Pravachol and Zocor   Toradol  [Ketorolac  Tromethamine ] Other (See  Comments)    Kidney disorder; serum creatinine above reference range   Zocor [Simvastatin] Other (See Comments)    Liver enzymes abnormal; outside reference range Myalgias   Niaspan [Niacin] Other (See Comments)    Vasomotor symptoms   Zestril [Lisinopril] Swelling, Dermatitis, Rash and Other (See Comments)    (Not in a hospital admission)   Physical Exam: Blood pressure (!) 147/69, pulse 87, temperature 99.8 F (37.7 C), temperature source Oral, resp. rate 13, height 6' (1.829 m), weight 92.5 kg, SpO2 100%. Gen: male, NAD Abd: soft, non-distended, diffusely tender, no rebound/guarding, no peritoneal signs  Results for orders placed or performed during the hospital encounter of 12/20/23 (from the past 48 hours)  Lipase, blood     Status: None   Collection Time: 12/20/23  4:38 PM  Result Value Ref Range   Lipase 19 11 - 51 U/L    Comment: Performed at [facility], [address], [place], [place] [address]  Comprehensive metabolic panel     Status: Abnormal   Collection Time: 12/20/23  4:38 PM  Result Value Ref Range   Sodium 136 135 - 145 mmol/L   Potassium 4.0 3.5 - 5.1 mmol/L   Chloride 105 98 - 111 mmol/L   CO2 18 (L) 22 - 32 mmol/L   Glucose, Bld 158 (H) 70 - 99 mg/dL    Comment: Glucose reference range applies only to samples taken after fasting for at least 8 hours.   BUN 27 (H) 8 - 23 mg/dL   Creatinine, Ser 7.57 (H) 0.61 - 1.24 mg/dL   Calcium  8.6 (L) 8.9 - 10.3 mg/dL   Total Protein 6.7 6.5 - 8.1 g/dL   Albumin  3.5 3.5 - 5.0 g/dL   AST 32 15 - 41 U/L   ALT 40 0 - 44 U/L   Alkaline Phosphatase 312 (H) 38 - 126 U/L   Total Bilirubin 1.0 0.0 - 1.2 mg/dL   GFR, Estimated 28 (L) >60 mL/min    Comment: (NOTE) Calculated using the CKD-EPI Creatinine Equation (2021)    Anion gap 13 5 - 15    Comment: Performed at [facility], [address] [place], [place] [address]  CBC with Differential     Status: Abnormal   Collection Time: 12/20/23  5:05 PM   Result Value Ref Range   WBC 7.6 4.0 - 10.5 K/uL   RBC 3.93 (L) 4.22 - 5.81 MIL/uL   Hemoglobin 11.2 (L) 13.0 - 17.0 g/dL   HCT 65.7 (L) 60.9 - 47.9 %   MCV 87.0 80.0 - 100.0 fL   MCH 28.5 26.0 - 34.0 pg   MCHC 32.7 30.0 - 36.0 g/dL   RDW 85.7 88.4 - 84.4 %   Platelets 170 150 - 400 K/uL   nRBC 0.0 0.0 - 0.2 %   Neutrophils Relative % 77 %   Neutro Abs 5.8 1.7 - 7.7 K/uL   Lymphocytes Relative 10 %  Lymphs Abs 0.8 0.7 - 4.0 K/uL   Monocytes Relative 12 %   Monocytes Absolute 0.9 0.1 - 1.0 K/uL   Eosinophils Relative 1 %   Eosinophils Absolute 0.1 0.0 - 0.5 K/uL   Basophils Relative 0 %   Basophils Absolute 0.0 0.0 - 0.1 K/uL   Immature Granulocytes 0 %   Abs Immature Granulocytes 0.03 0.00 - 0.07 K/uL    Comment: Performed at [facility], [address] [place], [place] [address]  Urinalysis, Routine w reflex microscopic -Urine, Clean Catch     Status: Abnormal   Collection Time: 12/20/23  7:13 PM  Result Value Ref Range   Color, Urine YELLOW YELLOW   APPearance CLEAR CLEAR   Specific Gravity, Urine 1.016 1.005 - 1.030   pH 5.0 5.0 - 8.0   Glucose, UA NEGATIVE NEGATIVE mg/dL   Hgb urine dipstick NEGATIVE NEGATIVE   Bilirubin Urine NEGATIVE NEGATIVE   Ketones, ur NEGATIVE NEGATIVE mg/dL   Protein, ur 30 (A) NEGATIVE mg/dL   Nitrite NEGATIVE NEGATIVE   Leukocytes,Ua NEGATIVE NEGATIVE   RBC / HPF 0-5 0 - 5 RBC/hpf   WBC, UA 0-5 0 - 5 WBC/hpf   Bacteria, UA NONE SEEN NONE SEEN   Squamous Epithelial / HPF 0-5 0 - 5 /HPF   Mucus PRESENT    Hyaline Casts, UA PRESENT     Comment: Performed at [facility], [address] [place], [place] [address]   CT ABDOMEN PELVIS WO CONTRAST Result Date: 12/20/2023 CLINICAL DATA:  Acute abdominal pain EXAM: CT ABDOMEN AND PELVIS WITHOUT CONTRAST TECHNIQUE: Multidetector CT imaging of the abdomen and pelvis was performed following the standard protocol without IV contrast. RADIATION DOSE REDUCTION: This exam was performed  according to the departmental dose-optimization program which includes automated exposure control, adjustment of the mA and/or kV according to patient size and/or use of iterative reconstruction technique. COMPARISON:  None Available. FINDINGS: Lower chest: No acute abnormality. Hepatobiliary: No focal liver abnormality is seen. There surgical clips in the region of the IVC and portal vein. Status post cholecystectomy. No biliary dilatation. Pancreas: Unremarkable. No pancreatic ductal dilatation or surrounding inflammatory changes. Spleen: Normal in size without focal abnormality. Adrenals/Urinary Tract: Adrenal glands are unremarkable. Kidneys are normal, without renal calculi, focal lesion, or hydronephrosis. Bladder is unremarkable. Stomach/Bowel: There is moderate length segment of sigmoid colon wall thickening with marked surrounding inflammatory stranding. A few diverticula are seen in this region. Micro perforation present image 6/59. No focal abscess. No bowel obstruction. Appendix is within normal limits. Small bowel and stomach are within normal limits. Vascular/Lymphatic: Aortic atherosclerosis. No enlarged abdominal or pelvic lymph nodes. Reproductive: Prostate is unremarkable. Other: There is a small amount of free fluid in the pelvis. There is a small fat containing umbilical hernia. Musculoskeletal: Sternotomy wires are present. No acute fractures are seen. IMPRESSION: 1. Acute sigmoid colon diverticulitis versus colitis with micro perforation. No focal abscess. 2. Small amount of free fluid in the pelvis. Aortic Atherosclerosis (ICD10-I70.0). Electronically Signed   By: Greig Pique M.D.   On: 12/20/2023 19:41    Assessment/Plan 68 y/o M w/ a complicated PMH including liver transplant and recent STEMI presenting with acute diverticulitis w/ microperforation  - No indication for urgent surgical intervention.  - Admit to medicine - Rocephin /Flagyl  - Continue ASA/Brilinta  given recent MI -  NPO for now - Surgery will continue to follow. Please page the on call team with any changes in his abdominal exam or  clinical status  Cordella DELENA Polly Marlis Cheron Surgery 12/20/2023, 8:07 PM Please see Amion for pager number during day hours 7:00am-4:30pm or 7:00am -11:30am on weekends

## 2023-12-21 DIAGNOSIS — I10 Essential (primary) hypertension: Secondary | ICD-10-CM

## 2023-12-21 DIAGNOSIS — E785 Hyperlipidemia, unspecified: Secondary | ICD-10-CM

## 2023-12-21 DIAGNOSIS — E1169 Type 2 diabetes mellitus with other specified complication: Secondary | ICD-10-CM

## 2023-12-21 LAB — CBC
HCT: 37.6 % — ABNORMAL LOW (ref 39.0–52.0)
Hemoglobin: 12.6 g/dL — ABNORMAL LOW (ref 13.0–17.0)
MCH: 28.7 pg (ref 26.0–34.0)
MCHC: 33.5 g/dL (ref 30.0–36.0)
MCV: 85.6 fL (ref 80.0–100.0)
Platelets: 205 K/uL (ref 150–400)
RBC: 4.39 MIL/uL (ref 4.22–5.81)
RDW: 14.3 % (ref 11.5–15.5)
WBC: 10.9 K/uL — ABNORMAL HIGH (ref 4.0–10.5)
nRBC: 0 % (ref 0.0–0.2)

## 2023-12-21 LAB — BASIC METABOLIC PANEL WITH GFR
Anion gap: 12 (ref 5–15)
BUN: 24 mg/dL — ABNORMAL HIGH (ref 8–23)
CO2: 18 mmol/L — ABNORMAL LOW (ref 22–32)
Calcium: 8.5 mg/dL — ABNORMAL LOW (ref 8.9–10.3)
Chloride: 103 mmol/L (ref 98–111)
Creatinine, Ser: 2.01 mg/dL — ABNORMAL HIGH (ref 0.61–1.24)
GFR, Estimated: 35 mL/min — ABNORMAL LOW (ref >60)
Glucose, Bld: 194 mg/dL — ABNORMAL HIGH (ref 70–99)
Potassium: 3.5 mmol/L (ref 3.5–5.1)
Sodium: 133 mmol/L — ABNORMAL LOW (ref 135–145)

## 2023-12-21 LAB — HEPATIC FUNCTION PANEL
ALT: 40 U/L (ref 0–44)
AST: 33 U/L (ref 15–41)
Albumin: 3.3 g/dL — ABNORMAL LOW (ref 3.5–5.0)
Alkaline Phosphatase: 313 U/L — ABNORMAL HIGH (ref 38–126)
Bilirubin, Direct: 0.5 mg/dL — ABNORMAL HIGH (ref 0.0–0.2)
Indirect Bilirubin: 0.5 mg/dL (ref 0.3–0.9)
Total Bilirubin: 1 mg/dL (ref 0.0–1.2)
Total Protein: 6.6 g/dL (ref 6.5–8.1)

## 2023-12-21 LAB — CBG MONITORING, ED: Glucose-Capillary: 197 mg/dL — ABNORMAL HIGH (ref 70–99)

## 2023-12-21 LAB — GLUCOSE, CAPILLARY
Glucose-Capillary: 169 mg/dL — ABNORMAL HIGH (ref 70–99)
Glucose-Capillary: 258 mg/dL — ABNORMAL HIGH (ref 70–99)
Glucose-Capillary: 184 mg/dL — ABNORMAL HIGH (ref 70–99)
Glucose-Capillary: 155 mg/dL — ABNORMAL HIGH (ref 70–99)

## 2023-12-21 LAB — MAGNESIUM: Magnesium: 1.7 mg/dL (ref 1.7–2.4)

## 2023-12-21 MED ORDER — DEXTROSE-SODIUM CHLORIDE 5-0.9 % IV SOLN: INTRAVENOUS | Status: DC

## 2023-12-21 MED ORDER — SODIUM CHLORIDE 0.9 % IV SOLN: INTRAVENOUS | Status: DC

## 2023-12-21 MED ORDER — DOCUSATE SODIUM 100 MG PO CAPS
100.0000 mg | ORAL_CAPSULE | Freq: Two times a day (BID) | ORAL | Status: DC
  Administered 2023-12-21 – 2023-12-26 (×13): 100 mg via ORAL
  Filled 2023-12-21 (×11): qty 1

## 2023-12-21 NOTE — Assessment & Plan Note (Addendum)
 Continue hydrocortisone  to IV due to nausea and vomiting.  Continue blood pressure monitoring

## 2023-12-21 NOTE — Assessment & Plan Note (Addendum)
 Recent revascularization  Continue dual antiplatelet therapy with aspirin  and ticagrelor . Continue blood pressure monitoring   History of ventricular tachycardia, continue with mexiletine

## 2023-12-21 NOTE — Assessment & Plan Note (Addendum)
 Patient did not tolerate clears, developing nausea and vomiting.  Continue supportive medical therapy with IV fluids, isotonic saline at 100 ml per hr As needed analgesics and antiemetics.  Antiacid therapy with pantoprazole .   Antibiotic therapy with IV Zosyn .   Placing NG tube today due to nausea and vomiting  Follow up cell count and temperature curve Follow up with surgery recommendations.  Follow up with CT abdomen and pelvis

## 2023-12-21 NOTE — Plan of Care (Signed)
  Problem: Metabolic: Goal: Ability to maintain appropriate glucose levels will improve Outcome: Progressing   Problem: Education: Goal: Knowledge of General Education information will improve Description: Including pain rating scale, medication(s)/side effects and non-pharmacologic comfort measures Outcome: Progressing   Problem: Pain Managment: Goal: General experience of comfort will improve and/or be controlled Outcome: Progressing

## 2023-12-21 NOTE — Hospital Course (Addendum)
 Mr. Semmel was admitted to the hospital with perforated diverticulitis.   68 yo male with the past medical history of T2DM, hypertension, hyperlipidemia, CKD,coronary artery disease sp CABG, adrenal insufficiency, and history of liver transplant who presented with abdominal pain.    Recent hospitalization 06/28 to 11/11/23 for STEMI, involving RCA, complicated with complete heart block and hypotension. He underwent PCI with drug eluding stent and recommended to continue dual antiplatelet therapy with aspirin  and ticagrelor  for at least one year.   Reported 3 days of abdominal pain, severe in intensity with no nausea or vomiting. No chest pain or dyspnea. EMS was called, he was found in severe pain, received 100 mcg of fentanyl  and transported to the ED.  On his initial physical examination is blood pressure was 141/79, HR 81, RR 13 and 02 saturation 99% Lungs with no wheezing or rhonchi, heart with S1 and S2 present and regular, abdomen soft, with positive bowel sounds, tender to palpation left lower quadrant, no lower extremity edema.   Na 136 K 4.0 Cl 105 bicarbonate 18 glucose 158 bun 27 cr 2,42  AST 32 ALT 40 ALKP 312  Wbc 7.6 hgb 11.2 plt 170   Urine analysis SG 1,016, protein 30, negative hgb and negative leukocytes.   CT abdomen and pelvis with acute sigmoid colon diverticulitis versus colitis with micro perforation, no focal abscess.  Small amount of free fluid in the pelvis.   EKG 84 bpm, normal axis, normal intervals, qtc 491, sinus rhythm with small q waves lead II, III, aVF, V3 to V6, with no significant ST segment or T wave changes.   Patient has been placed on supportive medical therapy with IV fluids, IV analgesics and IV antibiotics Surgery has been consulted.   08/12 today with nausea and vomiting, did not tolerate advancing his diet.  NG tube will be placed.

## 2023-12-21 NOTE — Progress Notes (Signed)
 Progress Note     Subjective: Pt reports abdominal pain is a 9/10 from 10/10 yesterday, so not significantly different. Still having pain across entire lower abdomen. Not really passing flatus and no BM in a few days. Patient is very hopeful to avoid surgery in setting of recent STEMI and hopes to avoid ostomy if possible.   Objective: Vital signs in last 24 hours: Temp:  [97.8 F (36.6 C)-102.9 F (39.4 C)] 97.8 F (36.6 C) (08/11 0800) Pulse Rate:  [81-107] 88 (08/11 0800) Resp:  [13-28] 18 (08/11 0800) BP: (134-147)/(69-79) 145/76 (08/11 0800) SpO2:  [93 %-100 %] 93 % (08/11 0800) FiO2 (%):  [21 %] 21 % (08/10 2028) Weight:  [92.5 kg-92.9 kg] 92.9 kg (08/11 0355) Last BM Date : 12/17/23 (per pt)  Intake/Output from previous day: 08/10 0701 - 08/11 0700 In: 150 [P.O.:150] Out: -  Intake/Output this shift: Total I/O In: 220 [P.O.:220] Out: 180 [Urine:180]  PE: General: pleasant, WD, overweight male who is laying in bed, appears uncomfortable Heart: regular, rate, and rhythm.  Palpable radial and pedal pulses bilaterally Lungs: Respiratory effort nonlabored Abd: soft, TTP across lower abdomen with voluntary guarding, mild distention Psych: A&Ox3 with an appropriate affect.    Lab Results:  Recent Labs    12/20/23 1705 12/21/23 0118  WBC 7.6 10.9*  HGB 11.2* 12.6*  HCT 34.2* 37.6*  PLT 170 205   BMET Recent Labs    12/20/23 1638 12/21/23 0118  NA 136 133*  K 4.0 3.5  CL 105 103  CO2 18* 18*  GLUCOSE 158* 194*  BUN 27* 24*  CREATININE 2.42* 2.01*  CALCIUM  8.6* 8.5*   PT/INR No results for input(s): LABPROT, INR in the last 72 hours. CMP     Component Value Date/Time   NA 133 (L) 12/21/2023 0118   K 3.5 12/21/2023 0118   CL 103 12/21/2023 0118   CO2 18 (L) 12/21/2023 0118   GLUCOSE 194 (H) 12/21/2023 0118   BUN 24 (H) 12/21/2023 0118   CREATININE 2.01 (H) 12/21/2023 0118   CALCIUM  8.5 (L) 12/21/2023 0118   PROT 6.6 12/21/2023 0118    ALBUMIN  3.3 (L) 12/21/2023 0118   AST 33 12/21/2023 0118   ALT 40 12/21/2023 0118   ALKPHOS 313 (H) 12/21/2023 0118   BILITOT 1.0 12/21/2023 0118   GFRNONAA 35 (L) 12/21/2023 0118   GFRAA >60 12/19/2019 0309   Lipase     Component Value Date/Time   LIPASE 19 12/20/2023 1638       Studies/Results: CT ABDOMEN PELVIS WO CONTRAST Result Date: 12/20/2023 CLINICAL DATA:  Acute abdominal pain EXAM: CT ABDOMEN AND PELVIS WITHOUT CONTRAST TECHNIQUE: Multidetector CT imaging of the abdomen and pelvis was performed following the standard protocol without IV contrast. RADIATION DOSE REDUCTION: This exam was performed according to the departmental dose-optimization program which includes automated exposure control, adjustment of the mA and/or kV according to patient size and/or use of iterative reconstruction technique. COMPARISON:  None Available. FINDINGS: Lower chest: No acute abnormality. Hepatobiliary: No focal liver abnormality is seen. There surgical clips in the region of the IVC and portal vein. Status post cholecystectomy. No biliary dilatation. Pancreas: Unremarkable. No pancreatic ductal dilatation or surrounding inflammatory changes. Spleen: Normal in size without focal abnormality. Adrenals/Urinary Tract: Adrenal glands are unremarkable. Kidneys are normal, without renal calculi, focal lesion, or hydronephrosis. Bladder is unremarkable. Stomach/Bowel: There is moderate length segment of sigmoid colon wall thickening with marked surrounding inflammatory stranding. A few diverticula are seen  in this region. Micro perforation present image 6/59. No focal abscess. No bowel obstruction. Appendix is within normal limits. Small bowel and stomach are within normal limits. Vascular/Lymphatic: Aortic atherosclerosis. No enlarged abdominal or pelvic lymph nodes. Reproductive: Prostate is unremarkable. Other: There is a small amount of free fluid in the pelvis. There is a small fat containing umbilical  hernia. Musculoskeletal: Sternotomy wires are present. No acute fractures are seen. IMPRESSION: 1. Acute sigmoid colon diverticulitis versus colitis with micro perforation. No focal abscess. 2. Small amount of free fluid in the pelvis. Aortic Atherosclerosis (ICD10-I70.0). Electronically Signed   By: Greig Pique M.D.   On: 12/20/2023 19:41    Anti-infectives: Anti-infectives (From admission, onward)    Start     Dose/Rate Route Frequency Ordered Stop   12/21/23 1000  Isavuconazonium Sulfate  CAPS 372 mg        372 mg Oral Daily 12/20/23 2030     12/21/23 0300  piperacillin -tazobactam (ZOSYN ) IVPB 3.375 g  Status:  Discontinued       Placed in Followed by Linked Group   3.375 g 12.5 mL/hr over 240 Minutes Intravenous Every 8 hours 12/20/23 2035 12/20/23 2053   12/21/23 0300  piperacillin -tazobactam (ZOSYN ) IVPB 3.375 g        3.375 g 12.5 mL/hr over 240 Minutes Intravenous Every 8 hours 12/20/23 2053     12/20/23 2045  piperacillin -tazobactam (ZOSYN ) IVPB 3.375 g  Status:  Discontinued       Placed in Followed by Linked Group   3.375 g 100 mL/hr over 30 Minutes Intravenous  Once 12/20/23 2035 12/20/23 2053   12/20/23 2015  cefTRIAXone  (ROCEPHIN ) 2 g in sodium chloride  0.9 % 100 mL IVPB  Status:  Discontinued        2 g 200 mL/hr over 30 Minutes Intravenous  Once 12/20/23 2009 12/20/23 2014   12/20/23 2000  piperacillin -tazobactam (ZOSYN ) IVPB 3.375 g  Status:  Discontinued        3.375 g 100 mL/hr over 30 Minutes Intravenous  Once 12/20/23 1945 12/20/23 1954   12/20/23 2000  cefTRIAXone  (ROCEPHIN ) 1 g in sodium chloride  0.9 % 100 mL IVPB  Status:  Discontinued        1 g 200 mL/hr over 30 Minutes Intravenous  Once 12/20/23 1954 12/20/23 2009   12/20/23 2000  metroNIDAZOLE  (FLAGYL ) IVPB 500 mg  Status:  Discontinued        500 mg 100 mL/hr over 60 Minutes Intravenous  Once 12/20/23 1954 12/20/23 2014        Assessment/Plan  Acute sigmoid diverticulitis w/ microperf - CT 8/10  with above, no focal abscess, small amt free fluid in pelvis - WBC 10.9 from 7. Tmax 102.9, hemodynamics improving  - significant TTP on exam still - would recommend NPO with ice chips and sips for meds only - mobilize as able - continue IV abx - no indication for emergent surgical intervention but will almost certainly need repeat imaging in a few days. 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  This care required moderate level of medical decision making.    Burnard JONELLE Louder, [facility] Surgery 12/21/2023, 9:32 AM Please see Amion for pager number during day hours 7:00am-4:30pm

## 2023-12-21 NOTE — Assessment & Plan Note (Signed)
Continue with sertraline  

## 2023-12-21 NOTE — Assessment & Plan Note (Addendum)
 Hyponatremia  Renal function with serum cr at 2,54 with K at 4,2 and serum bicarbonate at 20  Na 135   Plan to continue IV fluids with isotonic saline and follow up renal function and electrolytes in am.

## 2023-12-21 NOTE — Assessment & Plan Note (Addendum)
 Fasting glucose today 146 mg/dl    Plan to continue insulin  sliding scale for glucose cover and monitoring

## 2023-12-21 NOTE — Progress Notes (Signed)
 Progress Note   Patient: Randall Frazier FMW:[id number] DOB: [birth year] DOA: 12/20/2023     1 DOS: the patient was seen and examined on 12/21/2023   Brief hospital course: Randall Frazier was admitted to the hospital with perforated diverticulitis.   68 yo male with the past medical history of T2DM, hypertension, hyperlipidemia, CKD,coronary artery disease sp CABG, adrenal insufficiency, and history of liver transplant who presented with abdominal pain.    Recent hospitalization 06/28 to 11/11/23 for STEMI, involving RCA, complicated with complete heart block and hypotension. He underwent PCI with drug eluding stent and recommended to continue dual antiplatelet therapy with aspirin  and ticagrelor  for at least one year.   Reported 3 days of abdominal pain, severe in intensity with no nausea or vomiting. No chest pain or dyspnea. EMS was called, he was found in severe pain, received 100 mcg of fentanyl  and transported to the ED.  On his initial physical examination is blood pressure was 141/79, HR 81, RR 13 and 02 saturation 99% Lungs with no wheezing or rhonchi, heart with S1 and S2 present and regular, abdomen soft, with positive bowel sounds, tender to palpation left lower quadrant, no lower extremity edema.   Na 136 K 4.0 Cl 105 bicarbonate 18 glucose 158 bun 27 cr 2,42  AST 32 ALT 40 ALKP 312  Wbc 7.6 hgb 11.2 plt 170   Urine analysis SG 1,016, protein 30, negative hgb and negative leukocytes.   CT abdomen and pelvis with acute sigmoid colon diverticulitis versus colitis with micro perforation, no focal abscess.  Small amount of free fluid in the pelvis.   EKG 84 bpm, normal axis, normal intervals, qtc 491, sinus rhythm with small q waves lead II, III, aVF, V3 to V6, with no significant ST segment or T wave changes.   Patient has been placed on supportive medical therapy with IV fluids, IV analgesics and IV antibiotics Surgery has been consulted.   Assessment and Plan: * Acute  diverticulitis Plan to continue supportive medical therapy with IV fluids, isotonic saline at 100 ml per hr As needed analgesics and antiemetics.  Antiacid therapy with pantoprazole .   Antibiotic therapy with Zosyn .   Follow up cell count and temperature curve Follow up with surgery recommendations.  Follow up with CT abdomen and pelvis   CAD S/P percutaneous coronary angioplasty Recent revascularization  Continue dual antiplatelet therapy with aspirin  and ticagrelor . Continue blood pressure monitoring   History of ventricular tachycardia, continue with mexiletine   Essential hypertension Continue blood pressure control with metoprolol  succinate   Chronic kidney disease (CKD), stage IV (severe) (HCC) Hyponatremia  Renal function with serum cr at 2.0 with K at 3,5 and serum bicarbonate at 18  Na 133 and Mg 1.7   Plan to continue IV fluids with isotonic saline and follow up renal function and electrolytes in am.   Adrenal insufficiency (HCC) Continue hydrocortisone   Continue blood pressure monitoring   Type 2 diabetes mellitus with hyperlipidemia (HCC) Fasting glucose today 194,   Plan to continue insulin  sliding scale for glucose cover and monitoring   Liver transplant recipient [name]) Continue immunosuppressive therapy with siroliums mycophenolate    Depression Continue with sertraline       Subjective: Patient continue to have lower abdominal pain, intermittent, moderate to severe in intensity, improved with analgesics, no nausea or vomiting, no bowel movement or flatus.   Physical Exam: Vitals:   12/21/23 0355 12/21/23 0615 12/21/23 0800 12/21/23 1115  BP: 138/74 (!) 145/77 (!) 145/76 (!) 142/79  Pulse: (!) 105 92 88 84  Resp: 18 20 18 18   Temp: (!) 101 F (38.3 C) 98.4 F (36.9 C) 97.8 F (36.6 C) 97.7 F (36.5 C)  TempSrc: Oral Oral Oral Oral  SpO2: 94% 95% 93% 92%  Weight: 92.9 kg     Height: 6' (1.829 m)      Neurology awake and alert ENT with mild  pallor with no icterus Cardiovascular with S1 and S2 present and regular with no gallops, rubs or murmurs Respiratory with no rales or wheezing, no rhonchi  Abdomen with no distention, soft to palpation, tender to deep palpation at the lower zones with no guarding or rebound.   No lower extremity edema.  Data Reviewed:    Family Communication: no family at the bedside   Disposition: Status is: Inpatient Remains inpatient appropriate because: IV fluids and IV antibiotics   Planned Discharge Destination: Home     Author: Elidia Toribio Furnace, MD 12/21/2023 12:26 PM  For on call review [email or web address].

## 2023-12-21 NOTE — Assessment & Plan Note (Signed)
 Continue blood pressure control with metoprolol  succinate

## 2023-12-21 NOTE — Assessment & Plan Note (Signed)
 Continue immunosuppressive therapy with siroliums mycophenolate 

## 2023-12-22 ENCOUNTER — Inpatient Hospital Stay (HOSPITAL_COMMUNITY)

## 2023-12-22 LAB — BASIC METABOLIC PANEL WITH GFR
Anion gap: 12 (ref 5–15)
BUN: 26 mg/dL — ABNORMAL HIGH (ref 8–23)
CO2: 20 mmol/L — ABNORMAL LOW (ref 22–32)
Calcium: 8.5 mg/dL — ABNORMAL LOW (ref 8.9–10.3)
Chloride: 103 mmol/L (ref 98–111)
Creatinine, Ser: 2.54 mg/dL — ABNORMAL HIGH (ref 0.61–1.24)
GFR, Estimated: 27 mL/min — ABNORMAL LOW (ref >60)
Glucose, Bld: 146 mg/dL — ABNORMAL HIGH (ref 70–99)
Potassium: 4.2 mmol/L (ref 3.5–5.1)
Sodium: 135 mmol/L (ref 135–145)

## 2023-12-22 LAB — CBC
HCT: 28.8 % — ABNORMAL LOW (ref 39.0–52.0)
Hemoglobin: 9.6 g/dL — ABNORMAL LOW (ref 13.0–17.0)
MCH: 28.7 pg (ref 26.0–34.0)
MCHC: 33.3 g/dL (ref 30.0–36.0)
MCV: 86.2 fL (ref 80.0–100.0)
Platelets: 154 K/uL (ref 150–400)
RBC: 3.34 MIL/uL — ABNORMAL LOW (ref 4.22–5.81)
RDW: 14.2 % (ref 11.5–15.5)
WBC: 8.9 K/uL (ref 4.0–10.5)
nRBC: 0 % (ref 0.0–0.2)

## 2023-12-22 LAB — GLUCOSE, CAPILLARY
Glucose-Capillary: 120 mg/dL — ABNORMAL HIGH (ref 70–99)
Glucose-Capillary: 132 mg/dL — ABNORMAL HIGH (ref 70–99)
Glucose-Capillary: 141 mg/dL — ABNORMAL HIGH (ref 70–99)
Glucose-Capillary: 146 mg/dL — ABNORMAL HIGH (ref 70–99)
Glucose-Capillary: 146 mg/dL — ABNORMAL HIGH (ref 70–99)
Glucose-Capillary: 146 mg/dL — ABNORMAL HIGH (ref 70–99)
Glucose-Capillary: 148 mg/dL — ABNORMAL HIGH (ref 70–99)
Glucose-Capillary: 149 mg/dL — ABNORMAL HIGH (ref 70–99)

## 2023-12-22 MED ORDER — PANTOPRAZOLE SODIUM 40 MG IV SOLR
40.0000 mg | INTRAVENOUS | Status: DC
  Administered 2023-12-23 – 2023-12-26 (×5): 40 mg via INTRAVENOUS
  Filled 2023-12-22 (×4): qty 10

## 2023-12-22 MED ORDER — SODIUM CHLORIDE 0.9 % IV SOLN: INTRAVENOUS | Status: AC

## 2023-12-22 MED ORDER — HYDROCORTISONE SOD SUC (PF) 100 MG IJ SOLR
20.0000 mg | Freq: Three times a day (TID) | INTRAMUSCULAR | Status: DC
  Administered 2023-12-22 – 2023-12-23 (×6): 20 mg via INTRAVENOUS
  Filled 2023-12-22 (×6): qty 0.4

## 2023-12-22 NOTE — Progress Notes (Signed)
 This RN and another RN attempted NGT placement in both nostrils. Resistance met very quickly on both sides. 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 Burnard PA notified.

## 2023-12-22 NOTE — Progress Notes (Signed)
 Progress Note     Subjective: Pt reports abdominal pain is still present but definitely improved from yesterday. Passing small amount flatus. Denies nausea or vomiting. Was not able to get up or mobilize at all yesterday. Pain is still primarily across lower abdomen.   Objective: Vital signs in last 24 hours: Temp:  [97.2 F (36.2 C)-99.3 F (37.4 C)] 97.2 F (36.2 C) (08/12 0700) Pulse Rate:  [70-84] 71 (08/12 0942) Resp:  [16-18] 18 (08/12 0800) BP: (123-142)/(62-84) 123/84 (08/12 0800) SpO2:  [90 %-95 %] 94 % (08/12 0800) Weight:  [94.3 kg] 94.3 kg (08/12 0405) Last BM Date :  (12/17/2023)  Intake/Output from previous day: 08/11 0701 - 08/12 0700 In: 220 [P.O.:220] Out: 555 [Urine:555] Intake/Output this shift: Total I/O In: 1965.8 [I.V.:1765.8; IV Piggyback:200] Out: -   PE: General: pleasant, WD, overweight male who is laying in bed, NAD Heart: regular, rate, and rhythm.  Lungs: Respiratory effort nonlabored Abd: soft, moderately ttp across lower abdomen, mild distention Psych: A&Ox3 with an appropriate affect.    Lab Results:  Recent Labs    12/21/23 0118 12/22/23 0217  WBC 10.9* 8.9  HGB 12.6* 9.6*  HCT 37.6* 28.8*  PLT 205 154   BMET Recent Labs    12/21/23 0118 12/22/23 0217  NA 133* 135  K 3.5 4.2  CL 103 103  CO2 18* 20*  GLUCOSE 194* 146*  BUN 24* 26*  CREATININE 2.01* 2.54*  CALCIUM  8.5* 8.5*   PT/INR No results for input(s): LABPROT, INR in the last 72 hours. CMP     Component Value Date/Time   NA 135 12/22/2023 0217   K 4.2 12/22/2023 0217   CL 103 12/22/2023 0217   CO2 20 (L) 12/22/2023 0217   GLUCOSE 146 (H) 12/22/2023 0217   BUN 26 (H) 12/22/2023 0217   CREATININE 2.54 (H) 12/22/2023 0217   CALCIUM  8.5 (L) 12/22/2023 0217   PROT 6.6 12/21/2023 0118   ALBUMIN  3.3 (L) 12/21/2023 0118   AST 33 12/21/2023 0118   ALT 40 12/21/2023 0118   ALKPHOS 313 (H) 12/21/2023 0118   BILITOT 1.0 12/21/2023 0118   GFRNONAA 27 (L)  12/22/2023 0217   GFRAA >60 12/19/2019 0309   Lipase     Component Value Date/Time   LIPASE 19 12/20/2023 1638       Studies/Results: CT ABDOMEN PELVIS WO CONTRAST Result Date: 12/20/2023 CLINICAL DATA:  Acute abdominal pain EXAM: CT ABDOMEN AND PELVIS WITHOUT CONTRAST TECHNIQUE: Multidetector CT imaging of the abdomen and pelvis was performed following the standard protocol without IV contrast. RADIATION DOSE REDUCTION: This exam was performed according to the departmental dose-optimization program which includes automated exposure control, adjustment of the mA and/or kV according to patient size and/or use of iterative reconstruction technique. COMPARISON:  None Available. FINDINGS: Lower chest: No acute abnormality. Hepatobiliary: No focal liver abnormality is seen. There surgical clips in the region of the IVC and portal vein. Status post cholecystectomy. No biliary dilatation. Pancreas: Unremarkable. No pancreatic ductal dilatation or surrounding inflammatory changes. Spleen: Normal in size without focal abnormality. Adrenals/Urinary Tract: Adrenal glands are unremarkable. Kidneys are normal, without renal calculi, focal lesion, or hydronephrosis. Bladder is unremarkable. Stomach/Bowel: There is moderate length segment of sigmoid colon wall thickening with marked surrounding inflammatory stranding. A few diverticula are seen in this region. Micro perforation present image 6/59. No focal abscess. No bowel obstruction. Appendix is within normal limits. Small bowel and stomach are within normal limits. Vascular/Lymphatic: Aortic atherosclerosis. No enlarged  abdominal or pelvic lymph nodes. Reproductive: Prostate is unremarkable. Other: There is a small amount of free fluid in the pelvis. There is a small fat containing umbilical hernia. Musculoskeletal: Sternotomy wires are present. No acute fractures are seen. IMPRESSION: 1. Acute sigmoid colon diverticulitis versus colitis with micro perforation.  No focal abscess. 2. Small amount of free fluid in the pelvis. Aortic Atherosclerosis (ICD10-I70.0). Electronically Signed   By: Greig Pique M.D.   On: 12/20/2023 19:41    Anti-infectives: Anti-infectives (From admission, onward)    Start     Dose/Rate Route Frequency Ordered Stop   12/21/23 1000  Isavuconazonium Sulfate  CAPS 372 mg        372 mg Oral Daily 12/20/23 2030     12/21/23 0300  piperacillin -tazobactam (ZOSYN ) IVPB 3.375 g  Status:  Discontinued       Placed in Followed by Linked Group   3.375 g 12.5 mL/hr over 240 Minutes Intravenous Every 8 hours 12/20/23 2035 12/20/23 2053   12/21/23 0300  piperacillin -tazobactam (ZOSYN ) IVPB 3.375 g        3.375 g 12.5 mL/hr over 240 Minutes Intravenous Every 8 hours 12/20/23 2053     12/20/23 2045  piperacillin -tazobactam (ZOSYN ) IVPB 3.375 g  Status:  Discontinued       Placed in Followed by Linked Group   3.375 g 100 mL/hr over 30 Minutes Intravenous  Once 12/20/23 2035 12/20/23 2053   12/20/23 2015  cefTRIAXone  (ROCEPHIN ) 2 g in sodium chloride  0.9 % 100 mL IVPB  Status:  Discontinued        2 g 200 mL/hr over 30 Minutes Intravenous  Once 12/20/23 2009 12/20/23 2014   12/20/23 2000  piperacillin -tazobactam (ZOSYN ) IVPB 3.375 g  Status:  Discontinued        3.375 g 100 mL/hr over 30 Minutes Intravenous  Once 12/20/23 1945 12/20/23 1954   12/20/23 2000  cefTRIAXone  (ROCEPHIN ) 1 g in sodium chloride  0.9 % 100 mL IVPB  Status:  Discontinued        1 g 200 mL/hr over 30 Minutes Intravenous  Once 12/20/23 1954 12/20/23 2009   12/20/23 2000  metroNIDAZOLE  (FLAGYL ) IVPB 500 mg  Status:  Discontinued        500 mg 100 mL/hr over 60 Minutes Intravenous  Once 12/20/23 1954 12/20/23 2014        Assessment/Plan  Acute sigmoid diverticulitis w/ microperf - CT 8/10 with above, no focal abscess, small amt free fluid in pelvis - WBC 8.9. afebrile and HD stable  - TTP on exam is improved slightly today - ok to have CLD today, would  not advance past this today - mobilize as able - continue IV abx - no indication for emergent surgical intervention but will almost certainly need repeat imaging in a few days. 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  This care required moderate level of medical decision making.    Burnard  JONELLE Louder, [facility] Surgery 12/22/2023, 9:46 AM Please see Amion for pager number during day hours 7:00am-4:30pm

## 2023-12-22 NOTE — Progress Notes (Signed)
 Progress Note   Patient: Randall Frazier FMW:[id number] DOB: 04/09/1956 DOA: 12/20/2023     2 DOS: the patient was seen and examined on 12/22/2023   Brief hospital course: Randall Frazier was admitted to the hospital with perforated diverticulitis.   68 yo male with the past medical history of T2DM, hypertension, hyperlipidemia, CKD,coronary artery disease sp CABG, adrenal insufficiency, and history of liver transplant who presented with abdominal pain.    Recent hospitalization 06/28 to 11/11/23 for STEMI, involving RCA, complicated with complete heart block and hypotension. He underwent PCI with drug eluding stent and recommended to continue dual antiplatelet therapy with aspirin  and ticagrelor  for at least one year.   Reported 3 days of abdominal pain, severe in intensity with no nausea or vomiting. No chest pain or dyspnea. EMS was called, he was found in severe pain, received 100 mcg of fentanyl  and transported to the ED.  On his initial physical examination is blood pressure was 141/79, HR 81, RR 13 and 02 saturation 99% Lungs with no wheezing or rhonchi, heart with S1 and S2 present and regular, abdomen soft, with positive bowel sounds, tender to palpation left lower quadrant, no lower extremity edema.   Na 136 K 4.0 Cl 105 bicarbonate 18 glucose 158 bun 27 cr 2,42  AST 32 ALT 40 ALKP 312  Wbc 7.6 hgb 11.2 plt 170   Urine analysis SG 1,016, protein 30, negative hgb and negative leukocytes.   CT abdomen and pelvis with acute sigmoid colon diverticulitis versus colitis with micro perforation, no focal abscess.  Small amount of free fluid in the pelvis.   EKG 84 bpm, normal axis, normal intervals, qtc 491, sinus rhythm with small q waves lead II, III, aVF, V3 to V6, with no significant ST segment or T wave changes.   Patient has been placed on supportive medical therapy with IV fluids, IV analgesics and IV antibiotics Surgery has been consulted.   08/12 today with nausea and vomiting, did  not tolerate advancing his diet.  NG tube will be placed.   Assessment and Plan: * Acute diverticulitis Patient did not tolerate clears, developing nausea and vomiting.  Continue supportive medical therapy with IV fluids, isotonic saline at 100 ml per hr As needed analgesics and antiemetics.  Antiacid therapy with pantoprazole .   Antibiotic therapy with IV Zosyn .   Placing NG tube today due to nausea and vomiting  Follow up cell count and temperature curve Follow up with surgery recommendations.  Follow up with CT abdomen and pelvis   CAD S/P percutaneous coronary angioplasty Recent revascularization  Continue dual antiplatelet therapy with aspirin  and ticagrelor . Continue blood pressure monitoring   History of ventricular tachycardia, continue with mexiletine   Essential hypertension Continue blood pressure control with metoprolol  succinate   Chronic kidney disease (CKD), stage IV (severe) (HCC) Hyponatremia  Renal function with serum cr at 2,54 with K at 4,2 and serum bicarbonate at 20  Na 135   Plan to continue IV fluids with isotonic saline and follow up renal function and electrolytes in am.   Adrenal insufficiency (HCC) Continue hydrocortisone  to IV due to nausea and vomiting.  Continue blood pressure monitoring   Type 2 diabetes mellitus with hyperlipidemia (HCC) Fasting glucose today 146 mg/dl    Plan to continue insulin  sliding scale for glucose cover and monitoring   Liver transplant recipient [name] Continue immunosuppressive therapy with siroliums mycophenolate    Depression Continue with sertraline       Subjective: patient with nausea and vomiting  after trying clear liquids, abdominal pain is controlled with analgesics   Physical Exam: Vitals:   12/22/23 0700 12/22/23 0800 12/22/23 0942 12/22/23 1140  BP: 123/84 123/84  111/72  Pulse: 71 71 71 66  Resp: 16 18  17   Temp: (!) 97.2 F (36.2 C)   98 F (36.7 C)  TempSrc: Axillary   Oral  SpO2:  94% 94%  93%  Weight:      Height:       Neurology awake and alert, deconditioned and ill looking appearing ENT with mild pallor no icterus Cardiovascular with S1 and S2 present and regular with no gallops, rubs or murmurs Respiratory with no rales or wheezing, no rhonchi  Abdomen with mild distention, soft, not tender to superficial palpation with no rebound or guarding No lower extremity edema  Data Reviewed:    Family Communication: I spoke with Randall Frazier at the bedside, we talked in detail about Randall condition, plan of care and prognosis and all questions were addressed.  Disposition: Status is: Inpatient Remains inpatient appropriate because: supportive medical care   Planned Discharge Destination: Home     Author: Elidia Toribio Furnace, MD 12/22/2023 2:43 PM  For on call review [email or web address].

## 2023-12-22 NOTE — TOC Initial Note (Signed)
 Transition of Care [facility]) - Initial/Assessment Note    Patient Details  Name: Randall Frazier MRN: [id number] Date of Birth: 1955/06/16  Transition of [facility] CM/SW Contact:    Waddell Barnie Rama, RN Phone Number: 12/22/2023, 4:26 PM  Clinical Narrative:                 From home with spouse, has PCP- [facility], Dr. Milissa  and insurance on file, states has no [facility] services in place at this time , has walker, cane, leg vibration wraps at home.  States family member , ( wife) will transport them home at [employer] and family is support system, states gets medications from [facility].  Pta self ambulatory.   There are no ICM needs identified  at this time.  Please place consult for ICM needs.    Expected Discharge Plan: Home/Self Care Barriers to Discharge: Continued Medical Work up   Patient Goals and CMS Choice Patient states their goals for this hospitalization and ongoing recovery are:: return home   Choice offered to / list presented to : NA      Expected Discharge Plan and Services In-house Referral: NA Discharge Planning Services: CM Consult Post Acute Care Choice: NA Living arrangements for the past 2 months: Single Family Home                 DME Arranged: N/A DME Agency: NA       HH Arranged: NA          Prior Living Arrangements/Services Living arrangements for the past 2 months: Single Family Home Lives with:: Spouse Patient language and need for interpreter reviewed:: Yes Do you feel safe going back to the place where you live?: Yes      Need for Family Participation in Patient Care: Yes (Comment) Care giver support system in place?: Yes (comment) Current home services: DME (walker, cane, leg vibration wraps) Criminal Activity/Legal Involvement Pertinent to Current Situation/Hospitalization: No - Comment as needed  Activities of Daily Living   ADL Screening (condition at time of admission) Independently performs ADLs?: Yes (appropriate for  developmental age) Is the patient deaf or have difficulty hearing?: No Does the patient have difficulty seeing, even when wearing glasses/contacts?: No Does the patient have difficulty concentrating, remembering, or making decisions?: No  Permission Sought/Granted Permission sought to share information with : Case Manager Permission granted to share information with : Yes, Verbal Permission Granted  Share Information with NAME: wife           Emotional Assessment Appearance:: Appears stated age     Orientation: : Oriented to Self, Oriented to Place, Oriented to  Time, Oriented to Situation Alcohol  / Substance Use: Not Applicable Psych Involvement: No (comment)  Admission diagnosis:  Diverticulitis of colon with perforation [K57.20] Acute diverticulitis [K57.92] Patient Active Problem List   Diagnosis Date Noted   Acute diverticulitis 12/20/2023   VT (ventricular tachycardia) (HCC) 11/11/2023   Acute heart failure with mildly reduced ejection fraction (HFmrEF, 41-49%) (HCC) 11/11/2023   STEMI involving right coronary artery (HCC) 11/07/2023   ST elevation myocardial infarction involving right coronary artery (HCC) 11/07/2023   Failure to thrive in adult 10/03/2023   Benign essential hypertension 10/03/2023   Cirrhosis of liver (HCC) 10/03/2023   Dental root caries 10/03/2023   Depression 10/03/2023   Encounter for dental examination and cleaning without abnormal findings 10/03/2023   Family history of colonic polyps 10/03/2023   Gastroenteritis 10/03/2023   Hallux valgus with bunions 10/03/2023  Hypertension 10/03/2023   Idiopathic aseptic necrosis of bone (HCC) 10/03/2023   Trauma and stressor-related disorder 10/03/2023   Pain in joint of right shoulder 10/03/2023   Stress and adjustment reaction 10/03/2023   Acute infection of sinus 10/03/2023   Acute sinusitis, unspecified 10/03/2023   Other adrenocortical insufficiency (HCC) 10/03/2023   Adrenomedullary  hypofunction (HCC) 10/03/2023   Unspecified adrenocortical insufficiency (HCC) 10/03/2023   Anemia, unspecified 10/03/2023   Arthropathy 10/03/2023   Awaiting organ transplant status 10/03/2023   Chronic renal insufficiency 10/03/2023   Chronic kidney disease, unspecified 10/03/2023   Coagulopathy (HCC) 10/03/2023   Disease of sebaceous glands 10/03/2023   Myositis 10/03/2023   Dysphagia, unspecified 10/03/2023   Edema, unspecified 10/03/2023   Presence of other specified devices 10/03/2023   Exposure to potentially hazardous substance 10/03/2023   End stage renal disease (HCC) 10/03/2023   Gout 10/03/2023   Hepatosplenomegaly 10/03/2023   Hearing loss 10/03/2023   Other male erectile dysfunction 10/03/2023   Aspergillosis, unspecified (HCC) 10/03/2023   Other forms of aspergillosis (HCC) 10/03/2023   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   Leukopenia 10/03/2023   Major depressive disorder, single episode, moderate (HCC) 10/03/2023   Low back pain 10/03/2023   Major depressive disorder, recurrent, moderate (HCC) 10/03/2023   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   Muscle weakness (generalized) 10/03/2023   Necrotizing fasciitis (HCC) 10/03/2023   Fatty liver 10/03/2023   Need for assistance with personal care 10/03/2023   Other atopic dermatitis 10/03/2023   Morbid obesity (HCC) 10/03/2023   Other specified postprocedural states 10/03/2023   Pain in left foot 10/03/2023   Other speech disturbances 10/03/2023   Peripheral neuropathy 10/03/2023   Encounter for other preprocedural examination 10/03/2023   Encounter for screening examination for mental health and behavioral disorders, unspecified 10/03/2023   Encounter for screening for cardiovascular disorders 10/03/2023   Other specified counseling 10/03/2023   Person encountering health services to consult on behalf of another person 10/03/2023   Counseling, unspecified 10/03/2023   Problem related to unspecified psychosocial  circumstances 10/03/2023   PTSD (post-traumatic stress disorder) 10/03/2023   Spinal stenosis in cervical region 10/03/2023   Transportation insecurity 10/03/2023   Unilateral primary osteoarthritis, left knee 10/03/2023   Unspecified abnormalities of gait and mobility 10/03/2023   Unspecified blepharitis unspecified eye, unspecified eyelid 10/03/2023   Vitamin D deficiency 10/03/2023   Type 2 diabetes mellitus without complications (HCC) 10/03/2023   Myositis 10/03/2023   Nausea with vomiting, unspecified 10/03/2023   Hyperlipidemia 10/03/2023   Mixed hyperlipidemia 10/03/2023   Esophageal varices (HCC) 10/03/2023   Seborrheic dermatitis, unspecified 10/03/2023   CAD S/P percutaneous coronary angioplasty 10/03/2023   Decompensation of cirrhosis of liver (HCC) 10/03/2023   Depressive disorder 10/03/2023   S/P flap graft 02/09/2023   Adrenal insufficiency (HCC) 03/31/2022   Heart valve disease 03/31/2022   Chronic kidney disease (CKD), stage IV (severe) (HCC) 09/19/2021   Invasive fungal sinusitis 09/19/2021   Iron  deficiency anemia 07/12/2021   At risk for opportunistic infections 05/24/2021   Immunosuppression (HCC) 05/24/2021   Mouth sore 05/20/2021   Poor dentition 05/20/2021   Hypoxia 05/02/2021   Delirium 04/16/2021   Diabetes mellitus (HCC) 04/03/2021   Anemia 03/30/2021   Liver transplant recipient [facility]) 03/27/2021   Presence of transplanted liver (HCC) 03/27/2021   Liver transplant status (HCC) 03/26/2021   Hematemesis with nausea    GIB (gastrointestinal bleeding) 02/16/2021   Other cirrhosis of liver (HCC)    GI bleed 02/15/2021   Pre-transplant evaluation for  liver transplant 05/09/2020   Hepatic encephalopathy (HCC) 02/19/2020   NAFLD (nonalcoholic fatty liver disease) 90/69/7978   Cirrhosis, non-alcoholic (HCC) 02/09/2020   Portal hypertension with esophageal varices (HCC) 12/16/2019   Duodenal ulcer 12/16/2019   Gastric ulcer 12/16/2019   Gastritis  12/16/2019   Type 2 diabetes mellitus with hyperlipidemia (HCC) 12/15/2019   Decompensated hepatic cirrhosis (HCC) 12/15/2019   Hyperlipemia 12/15/2019   Dyspnea 12/15/2019   Thrombocytopenia (HCC) 12/15/2019   AKI (acute kidney injury) (HCC) 12/15/2019   Hypoalbuminemia 12/15/2019   Acute hepatic encephalopathy (HCC) 12/15/2019   Hyponatremia 12/15/2019   Cellulitis of right leg 12/15/2019   Melena 12/14/2019   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   Arthritis of shoulder region 07/02/2015   Chest pain syndrome 04/24/2015   Atherosclerotic heart disease of native coronary artery without angina pectoris 03/06/2015   Essential hypertension 03/06/2015   DJD of right shoulder 03/06/2015   Non morbid obesity due to excess calories 03/06/2015   Type 2 diabetes mellitus with diabetic polyneuropathy, with long-term current use of insulin  (HCC) 03/06/2015   DOE (dyspnea on exertion) 03/06/2015   Cervical spondylosis without myelopathy 12/20/2012    Class: Diagnosis of   Biliary dyskinesia 10/05/2012   PCP:  [facility]:   [facility] - [place], [place] - [address] [place] [address] Phone: [phone] Fax: [phone]     Social Drivers of Health (SDOH) Social History: SDOH Screenings   Food Insecurity: No Food Insecurity (12/21/2023)  Housing: Low Risk  (12/21/2023)  Transportation Needs: No Transportation Needs (12/21/2023)  Utilities: Not At Risk (12/21/2023)  Financial Resource Strain: Low Risk  (05/20/2022)   [facility]  Social Connections: Unknown (12/21/2023)  Stress: No Stress Concern Present (02/10/2020)   Received from [facility]  Tobacco Use: Low Risk  (12/20/2023)   SDOH Interventions:     Readmission Risk Interventions    11/11/2023   10:15 AM  Readmission Risk Prevention Plan  Transportation  Screening Complete  PCP or Specialist Appt within 3-5 Days Complete  HRI or Home Care Consult Complete  Social Work Consult for Recovery Care Planning/Counseling Complete  Palliative Care Screening Not Applicable  Medication Review Oceanographer) Complete

## 2023-12-23 ENCOUNTER — Ambulatory Visit: Admitting: Physician Assistant

## 2023-12-23 LAB — GLUCOSE, CAPILLARY
Glucose-Capillary: 165 mg/dL — ABNORMAL HIGH (ref 70–99)
Glucose-Capillary: 124 mg/dL — ABNORMAL HIGH (ref 70–99)
Glucose-Capillary: 128 mg/dL — ABNORMAL HIGH (ref 70–99)
Glucose-Capillary: 161 mg/dL — ABNORMAL HIGH (ref 70–99)
Glucose-Capillary: 224 mg/dL — ABNORMAL HIGH (ref 70–99)
Glucose-Capillary: 182 mg/dL — ABNORMAL HIGH (ref 70–99)

## 2023-12-23 LAB — BASIC METABOLIC PANEL WITH GFR
Anion gap: 14 (ref 5–15)
BUN: 34 mg/dL — ABNORMAL HIGH (ref 8–23)
CO2: 14 mmol/L — ABNORMAL LOW (ref 22–32)
Calcium: 8.5 mg/dL — ABNORMAL LOW (ref 8.9–10.3)
Chloride: 109 mmol/L (ref 98–111)
Creatinine, Ser: 2.75 mg/dL — ABNORMAL HIGH (ref 0.61–1.24)
GFR, Estimated: 24 mL/min — ABNORMAL LOW (ref >60)
Glucose, Bld: 149 mg/dL — ABNORMAL HIGH (ref 70–99)
Potassium: 3 mmol/L — ABNORMAL LOW (ref 3.5–5.1)
Sodium: 137 mmol/L (ref 135–145)

## 2023-12-23 LAB — CBC
HCT: 30.9 % — ABNORMAL LOW (ref 39.0–52.0)
Hemoglobin: 10.3 g/dL — ABNORMAL LOW (ref 13.0–17.0)
MCH: 28.9 pg (ref 26.0–34.0)
MCHC: 33.3 g/dL (ref 30.0–36.0)
MCV: 86.6 fL (ref 80.0–100.0)
Platelets: 186 K/uL (ref 150–400)
RBC: 3.57 MIL/uL — ABNORMAL LOW (ref 4.22–5.81)
RDW: 14.6 % (ref 11.5–15.5)
WBC: 6.8 K/uL (ref 4.0–10.5)
nRBC: 0 % (ref 0.0–0.2)

## 2023-12-23 LAB — MAGNESIUM: Magnesium: 2 mg/dL (ref 1.7–2.4)

## 2023-12-23 MED ORDER — HYDROCORTISONE 5 MG PO TABS: 5.0000 mg | ORAL_TABLET | ORAL | Status: DC

## 2023-12-23 MED ORDER — POTASSIUM CHLORIDE 10 MEQ/100ML IV SOLN
10.0000 meq | INTRAVENOUS | Status: AC
  Administered 2023-12-23 (×6): 10 meq via INTRAVENOUS
  Filled 2023-12-23 (×2): qty 100

## 2023-12-23 MED ORDER — SODIUM BICARBONATE 8.4 % IV SOLN: INTRAVENOUS | Status: DC

## 2023-12-23 MED ORDER — STERILE WATER FOR INJECTION IV SOLN
INTRAVENOUS | Status: DC
  Filled 2023-12-23 (×2): qty 1000
  Filled 2023-12-23: qty 150
  Filled 2023-12-23 (×2): qty 1000

## 2023-12-23 MED ORDER — HYDROCORTISONE 10 MG PO TABS
10.0000 mg | ORAL_TABLET | Freq: Every day | ORAL | Status: DC
  Administered 2023-12-23 – 2023-12-26 (×5): 10 mg via ORAL
  Filled 2023-12-23 (×4): qty 1

## 2023-12-23 MED ORDER — HYDROCORTISONE 5 MG PO TABS
5.0000 mg | ORAL_TABLET | Freq: Every day | ORAL | Status: DC
  Administered 2023-12-23 – 2023-12-25 (×4): 5 mg via ORAL
  Filled 2023-12-23 (×4): qty 1

## 2023-12-23 NOTE — Progress Notes (Signed)
 Progress Note     Subjective: Pain improved, just a little in the suprapubic area.  Nausea resolved.  Large bowel movement  Objective: Vital signs in last 24 hours: Temp:  [97.7 F (36.5 C)-99 F (37.2 C)] 99 F (37.2 C) (08/13 0750) Pulse Rate:  [66-79] 76 (08/13 0750) Resp:  [14-18] 18 (08/13 0750) BP: (111-143)/(72-96) 140/77 (08/13 0750) SpO2:  [92 %-98 %] 92 % (08/13 0750) Last BM Date : 12/23/23  Intake/Output from previous day: 08/12 0701 - 08/13 0700 In: 3510.4 [I.V.:3221.3; IV Piggyback:289.2] Out: 300 [Urine:300] Intake/Output this shift: No intake/output data recorded.  PE: General: pleasant, WD, overweight male who is laying in bed, NAD Heart: regular, rate, and rhythm.  Lungs: Respiratory effort nonlabored Abd: soft, mild suprapubic tenderness Psych: A&Ox3 with an appropriate affect.    Lab Results:  Recent Labs    12/22/23 0217 12/23/23 0501  WBC 8.9 6.8  HGB 9.6* 10.3*  HCT 28.8* 30.9*  PLT 154 186   BMET Recent Labs    12/22/23 0217 12/23/23 0501  NA 135 137  K 4.2 3.0*  CL 103 109  CO2 20* 14*  GLUCOSE 146* 149*  BUN 26* 34*  CREATININE 2.54* 2.75*  CALCIUM  8.5* 8.5*   PT/INR No results for input(s): LABPROT, INR in the last 72 hours. CMP     Component Value Date/Time   NA 137 12/23/2023 0501   K 3.0 (L) 12/23/2023 0501   CL 109 12/23/2023 0501   CO2 14 (L) 12/23/2023 0501   GLUCOSE 149 (H) 12/23/2023 0501   BUN 34 (H) 12/23/2023 0501   CREATININE 2.75 (H) 12/23/2023 0501   CALCIUM  8.5 (L) 12/23/2023 0501   PROT 6.6 12/21/2023 0118   ALBUMIN  3.3 (L) 12/21/2023 0118   AST 33 12/21/2023 0118   ALT 40 12/21/2023 0118   ALKPHOS 313 (H) 12/21/2023 0118   BILITOT 1.0 12/21/2023 0118   GFRNONAA 24 (L) 12/23/2023 0501   GFRAA >60 12/19/2019 0309   Lipase     Component Value Date/Time   LIPASE 19 12/20/2023 1638       Studies/Results: DG Abd Portable 1V Result Date: 12/22/2023 CLINICAL DATA:  Nausea and  vomiting. EXAM: PORTABLE ABDOMEN - 1 VIEW COMPARISON:  CT 12/20/2023 FINDINGS: Increasing gaseous distention of large and small bowel. No convincing pneumoperitoneum on supine views. Micro perforation on CT is not assessed by radiograph. Right upper quadrant surgical clips. IMPRESSION: Increasing gaseous distention of large and small bowel, favor ileus. Electronically Signed   By: Andrea Gasman M.D.   On: 12/22/2023 14:27    Anti-infectives: Anti-infectives (From admission, onward)    Start     Dose/Rate Route Frequency Ordered Stop   12/21/23 1000  Isavuconazonium Sulfate  CAPS 372 mg        372 mg Oral Daily 12/20/23 2030     12/21/23 0300  piperacillin -tazobactam (ZOSYN ) IVPB 3.375 g  Status:  Discontinued       Placed in Followed by Linked Group   3.375 g 12.5 mL/hr over 240 Minutes Intravenous Every 8 hours 12/20/23 2035 12/20/23 2053   12/21/23 0300  piperacillin -tazobactam (ZOSYN ) IVPB 3.375 g        3.375 g 12.5 mL/hr over 240 Minutes Intravenous Every 8 hours 12/20/23 2053     12/20/23 2045  piperacillin -tazobactam (ZOSYN ) IVPB 3.375 g  Status:  Discontinued       Placed in Followed by Linked Group   3.375 g 100 mL/hr over 30 Minutes Intravenous  Once  12/20/23 2035 12/20/23 2053   12/20/23 2015  cefTRIAXone  (ROCEPHIN ) 2 g in sodium chloride  0.9 % 100 mL IVPB  Status:  Discontinued        2 g 200 mL/hr over 30 Minutes Intravenous  Once 12/20/23 2009 12/20/23 2014   12/20/23 2000  piperacillin -tazobactam (ZOSYN ) IVPB 3.375 g  Status:  Discontinued        3.375 g 100 mL/hr over 30 Minutes Intravenous  Once 12/20/23 1945 12/20/23 1954   12/20/23 2000  cefTRIAXone  (ROCEPHIN ) 1 g in sodium chloride  0.9 % 100 mL IVPB  Status:  Discontinued        1 g 200 mL/hr over 30 Minutes Intravenous  Once 12/20/23 1954 12/20/23 2009   12/20/23 2000  metroNIDAZOLE  (FLAGYL ) IVPB 500 mg  Status:  Discontinued        500 mg 100 mL/hr over 60 Minutes Intravenous  Once 12/20/23 1954 12/20/23  2014        Assessment/Plan  Acute sigmoid diverticulitis w/ microperf - CT 8/10 with above, no focal abscess, small amt free fluid in pelvis - WBC normal. afebrile and HD stable  - Large BM - Retry clear liquid diet today  FEN: CLD, IVF per [facility] VTE: Brilinta /SCDs ID: Zosyn  8/10>>  - per [facility] -  Hx of liver transplant 3 years ago for FLD T2DM Adrenal insufficiency CKD IV HTN CAD s/p recent DES for STEMI on 11/07/23 on DAPT HFrEF (40-45%)   LOS: 3 days   I reviewed hospitalist notes, last 24 h vitals and pain scores, last 48 h intake and output, last 24 h labs and trends, and last 24 h imaging results.  This care required moderate level of medical decision making.    Deward JINNY Foy, MD  [facility] Surgery 12/23/2023, 8:01 AM Please see Amion for pager number during day hours 7:00am-4:30pm

## 2023-12-23 NOTE — Progress Notes (Signed)
 [facility]   ROUNDING   NOTE Randall Frazier FMW:[id number]  DOB: May 30, 1955  DOA: 12/20/2023  [facility]: [facility]  12/23/2023,7:47 AM  LOS: 3 days    Code Status: Full code     from: Home current Dispo: Unclear    68 year old white male 11/07/2023 STEMI with RCA and complete heart block + PPM + DAPT aspirin  Brilinta  1 year--monomorphic VT at that time treated with amnio lidocaine  per cardiology-not a LifeVest candidate and not a plan for ICD per EP at that time--last EF 40-45% Previous liver transplant 2022 [facility] on CellCept  250 twice daily hydrocortisone  variable dosing Rapamune  1 mg, isavuconazonium Chronic adrenal insufficiency  8/10 admit ABD pain + severe symptoms-Rx Zofran  and fentanyl  NS-white count never elevated Sodium 136 potassium 4.0 bicarb 18 BUN/creatinine 27/2.4 alk phos 312--WBC 7.6 hemoglobin 11.2 platelet 170 UA clean  General Surgery consulted 8/12 NG tube attempted but not placed  Plan  Diverticulitis complicated with ileus 1 episode of vomiting today of medications passed large stool has not had any further flatus today Some slight abdominal discomfort Symptomatic management per general surgery continues Zosyn  Q8  STEMI 10/18/2023 on DAPT-ischemic cardiomyopathy EF 40-45% Continue Toprol -XL 25 aspirin  81 Brilinta  90 twice daily Add back Zetia  10 Will need to continue Praluent  150 q. 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 at 100 cc/H, added potassium chloride  3 runs and recheck labs in morning  with magnesium     Data Reviewed:   Potassium 3.0 CO2 14 BUN/creatinine 30/2.7 WBC 6.8 hemoglobin 10.3 platelet 186  CBGs ranging 140-190  DVT prophylaxis: On ticagrelor   Status is: Inpatient Remains inpatient appropriate because:   Requires resolution of ileus     Subjective: Looks well no vomiting beyond this morning meds and he is about to get them again No chest pain No fever Not passing flatus currently     Objective + exam Vitals:   12/22/23 1740 12/22/23 2023 12/22/23 2349 12/23/23 0412  BP: 132/73 139/80 (!) 139/96 (!) 143/80  Pulse: 68 72 79 79  Resp:  18 18 18   Temp: 98.2 F (36.8 C) 98 F (36.7 C) 98.5 F (36.9 C) 98.5 F (36.9 C)  TempSrc: Oral Oral Oral Oral  SpO2: 93% 92% 96% 98%  Weight:      Height:       Filed Weights   12/20/23 1636 12/21/23 0355 12/22/23 0405  Weight: 92.5 kg 92.9 kg 94.3 kg     Examination: EOMI NCAT postop changes to face Neck soft supple No thyromegaly S1-S2 no murmur Abdomen soft slight tenderness in right upper quadrant ROM intact Neuro intact no focal deficit     Scheduled Meds:  aspirin   81 mg Oral Daily   docusate sodium   100 mg Oral BID   hydrocortisone  sod succinate (SOLU-CORTEF ) inj  20 mg Intravenous TID   insulin  aspart  0-6 Units Subcutaneous Q4H   Isavuconazonium Sulfate   372 mg Oral Daily   metoprolol  succinate  25 mg Oral Daily  mexiletine  250 mg Oral Q12H   mycophenolate   250 mg Oral BID   pantoprazole  (PROTONIX ) IV  40 mg Intravenous Q24H   sertraline   100 mg Oral Daily   Sirolimus   1 mg Oral Daily   sodium chloride  flush  3 mL Intravenous Q12H   ticagrelor   90 mg Oral BID   Continuous Infusions:  sodium chloride  75 mL/hr at 12/23/23 0422   piperacillin -tazobactam (ZOSYN )  IV 3.375 g (12/23/23 0556)    Time 47  Colen Grimes, MD  [facility]

## 2023-12-23 NOTE — Plan of Care (Signed)

## 2023-12-24 LAB — GLUCOSE, CAPILLARY
Glucose-Capillary: 110 mg/dL — ABNORMAL HIGH (ref 70–99)
Glucose-Capillary: 101 mg/dL — ABNORMAL HIGH (ref 70–99)
Glucose-Capillary: 129 mg/dL — ABNORMAL HIGH (ref 70–99)
Glucose-Capillary: 153 mg/dL — ABNORMAL HIGH (ref 70–99)
Glucose-Capillary: 181 mg/dL — ABNORMAL HIGH (ref 70–99)

## 2023-12-24 LAB — MAGNESIUM: Magnesium: 1.9 mg/dL (ref 1.7–2.4)

## 2023-12-24 LAB — BASIC METABOLIC PANEL WITH GFR
Anion gap: 10 (ref 5–15)
BUN: 35 mg/dL — ABNORMAL HIGH (ref 8–23)
CO2: 20 mmol/L — ABNORMAL LOW (ref 22–32)
Calcium: 8.3 mg/dL — ABNORMAL LOW (ref 8.9–10.3)
Chloride: 107 mmol/L (ref 98–111)
Creatinine, Ser: 2.51 mg/dL — ABNORMAL HIGH (ref 0.61–1.24)
GFR, Estimated: 27 mL/min — ABNORMAL LOW (ref >60)
Glucose, Bld: 102 mg/dL — ABNORMAL HIGH (ref 70–99)
Potassium: 2.5 mmol/L — CL (ref 3.5–5.1)
Sodium: 137 mmol/L (ref 135–145)

## 2023-12-24 MED ORDER — MAGNESIUM SULFATE 2 GM/50ML IV SOLN
2.0000 g | Freq: Once | INTRAVENOUS | Status: AC
  Administered 2023-12-24: 2 g via INTRAVENOUS
  Filled 2023-12-24: qty 50

## 2023-12-24 MED ORDER — POTASSIUM CHLORIDE CRYS ER 20 MEQ PO TBCR
40.0000 meq | EXTENDED_RELEASE_TABLET | Freq: Two times a day (BID) | ORAL | Status: DC
  Administered 2023-12-24 – 2023-12-26 (×4): 40 meq via ORAL
  Filled 2023-12-24 (×3): qty 2

## 2023-12-24 MED ORDER — POTASSIUM CHLORIDE 10 MEQ/100ML IV SOLN
10.0000 meq | INTRAVENOUS | Status: AC
  Administered 2023-12-24 (×4): 10 meq via INTRAVENOUS
  Filled 2023-12-24 (×4): qty 100

## 2023-12-24 NOTE — Progress Notes (Signed)
 [facility]   ROUNDING   NOTE KELIJAH TOWRY FMW:[id number]  DOB: 03-Mar-1956  DOA: 12/20/2023  PCP: [facility]  12/24/2023,4:32 PM  LOS: 4 days    Code Status: Full code     from: Home current Dispo: Unclear    68 year old white male 11/07/2023 STEMI with RCA and complete heart block + PPM + DAPT aspirin  Brilinta  1 year--monomorphic VT at that time treated with amnio lidocaine  per cardiology-not a LifeVest candidate and not a plan for ICD per EP at that time--last EF 40-45% Previous liver transplant 2022 [facility] on CellCept  250 twice daily hydrocortisone  variable dosing Rapamune  1 mg, isavuconazonium Chronic adrenal insufficiency  8/10 admit ABD pain + severe symptoms-Rx Zofran  and fentanyl  NS-white count never elevated Sodium 136 potassium 4.0 bicarb 18 BUN/creatinine 27/2.4 alk phos 312--WBC 7.6 hemoglobin 11.2 platelet 170 UA clean  General Surgery consulted 8/12 NG tube attempted but not placed  Plan  Diverticulitis complicated with ileus Continue Zosyn  every 8 and continues on full liquid diet at this time-rest as per surgery Continues oxycodone  5 every 4 as needed Dilaudid  0.5-1 every 4 as needed  STEMI 10/18/2023 on DAPT-ischemic cardiomyopathy EF 40-45% Continue Toprol -XL 25 aspirin  81 Brilinta  90 twice daily Add back Zetia  10 Praluent  150 q. 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 at 100 cc/H,--- acidosis improving slowly    Data  Reviewed:   Potassium 2.5 BUN/creatinine 35/2.5 down from 34/2.7 Magnesium  1.9  DVT prophylaxis: On ticagrelor   Status is: Inpatient Remains inpatient appropriate because:   Requires resolution of ileus and ability to take p.o. before discharging home     Subjective: Looks well no vomiting beyond this morning meds and he is about to get them again No chest pain No fever Not passing flatus currently     Objective + exam Vitals:   12/23/23 1521 12/23/23 2010 12/24/23 0531 12/24/23 0750  BP: 137/70 (!) 140/65 132/79 129/74  Pulse: (!) 101 70 69 73  Resp: 18 18 17    Temp: 98.7 F (37.1 C) 98.3 F (36.8 C) 97.8 F (36.6 C) 98 F (36.7 C)  TempSrc: Oral  Oral   SpO2: 93% 96% 92% 93%  Weight:      Height:       Filed Weights   12/20/23 1636 12/21/23 0355 12/22/23 0405  Weight: 92.5 kg 92.9 kg 94.3 kg     Examination:  Awake coherent alert in nad No distress Cta b no wheeze rales rhonchi Abd slight distension no rebound no guard  No Le edema Rom intact     Scheduled Meds:  aspirin   81 mg Oral Daily   docusate sodium   100 mg Oral BID   hydrocortisone   10 mg Oral Daily   And   hydrocortisone   5 mg Oral QHS   insulin  aspart  0-6 Units Subcutaneous Q4H   Isavuconazonium Sulfate   372 mg Oral Daily   metoprolol  succinate  25 mg Oral Daily  mexiletine  250 mg Oral Q12H   mycophenolate   250 mg Oral BID   pantoprazole  (PROTONIX ) IV  40 mg Intravenous Q24H   potassium chloride   40 mEq Oral BID   sertraline   100 mg Oral Daily   Sirolimus   1 mg Oral Daily   sodium chloride  flush  3 mL Intravenous Q12H   ticagrelor   90 mg Oral BID   Continuous Infusions:  piperacillin -tazobactam (ZOSYN )  IV 3.375 g (12/24/23 1627)   sodium bicarbonate  150 mEq in sterile water  1,150 mL infusion 100 mL/hr at 12/23/23 2240    Time 33  Jai-Gurmukh Kenia Teagarden, MD  [facility]

## 2023-12-24 NOTE — Progress Notes (Signed)
 Progress Note     Subjective: Abdominal pain resolved. Tolerating cld. No n/v. BM yesterday.   Afebrile. WBC wnl on last check.  Objective: Vital signs in last 24 hours: Temp:  [97.8 F (36.6 C)-98.7 F (37.1 C)] 98 F (36.7 C) (08/14 0750) Pulse Rate:  [69-101] 73 (08/14 0750) Resp:  [17-19] 17 (08/14 0531) BP: (129-140)/(65-79) 129/74 (08/14 0750) SpO2:  [92 %-96 %] 93 % (08/14 0750) Last BM Date : 12/23/23  Intake/Output from previous day: 08/13 0701 - 08/14 0700 In: 691.6 [P.O.:480; I.V.:0.7; IV Piggyback:210.9] Out: -  Intake/Output this shift: No intake/output data recorded.  PE: General: pleasant, NAD Abd: soft, ND, NT  Lab Results:  Recent Labs    12/22/23 0217 12/23/23 0501  WBC 8.9 6.8  HGB 9.6* 10.3*  HCT 28.8* 30.9*  PLT 154 186   BMET Recent Labs    12/23/23 0501 12/24/23 0530  NA 137 137  K 3.0* 2.5*  CL 109 107  CO2 14* 20*  GLUCOSE 149* 102*  BUN 34* 35*  CREATININE 2.75* 2.51*  CALCIUM  8.5* 8.3*   PT/INR No results for input(s): LABPROT, INR in the last 72 hours. CMP     Component Value Date/Time   NA 137 12/24/2023 0530   K 2.5 (LL) 12/24/2023 0530   CL 107 12/24/2023 0530   CO2 20 (L) 12/24/2023 0530   GLUCOSE 102 (H) 12/24/2023 0530   BUN 35 (H) 12/24/2023 0530   CREATININE 2.51 (H) 12/24/2023 0530   CALCIUM  8.3 (L) 12/24/2023 0530   PROT 6.6 12/21/2023 0118   ALBUMIN  3.3 (L) 12/21/2023 0118   AST 33 12/21/2023 0118   ALT 40 12/21/2023 0118   ALKPHOS 313 (H) 12/21/2023 0118   BILITOT 1.0 12/21/2023 0118   GFRNONAA 27 (L) 12/24/2023 0530   GFRAA >60 12/19/2019 0309   Lipase     Component Value Date/Time   LIPASE 19 12/20/2023 1638       Studies/Results: DG Abd Portable 1V Result Date: 12/22/2023 CLINICAL DATA:  Nausea and vomiting. EXAM: PORTABLE ABDOMEN - 1 VIEW COMPARISON:  CT 12/20/2023 FINDINGS: Increasing gaseous distention of large and small bowel. No convincing pneumoperitoneum on supine views.  Micro perforation on CT is not assessed by radiograph. Right upper quadrant surgical clips. IMPRESSION: Increasing gaseous distention of large and small bowel, favor ileus. Electronically Signed   By: Andrea Gasman M.D.   On: 12/22/2023 14:27    Anti-infectives: Anti-infectives (From admission, onward)    Start     Dose/Rate Route Frequency Ordered Stop   12/21/23 1000  Isavuconazonium Sulfate  CAPS 372 mg        372 mg Oral Daily 12/20/23 2030     12/21/23 0300  piperacillin -tazobactam (ZOSYN ) IVPB 3.375 g  Status:  Discontinued       Placed in Followed by Linked Group   3.375 g 12.5 mL/hr over 240 Minutes Intravenous Every 8 hours 12/20/23 2035 12/20/23 2053   12/21/23 0300  piperacillin -tazobactam (ZOSYN ) IVPB 3.375 g        3.375 g 12.5 mL/hr over 240 Minutes Intravenous Every 8 hours 12/20/23 2053     12/20/23 2045  piperacillin -tazobactam (ZOSYN ) IVPB 3.375 g  Status:  Discontinued       Placed in Followed by Linked Group   3.375 g 100 mL/hr over 30 Minutes Intravenous  Once 12/20/23 2035 12/20/23 2053   12/20/23 2015  cefTRIAXone  (ROCEPHIN ) 2 g in sodium chloride  0.9 % 100 mL IVPB  Status:  Discontinued  2 g 200 mL/hr over 30 Minutes Intravenous  Once 12/20/23 2009 12/20/23 2014   12/20/23 2000  piperacillin -tazobactam (ZOSYN ) IVPB 3.375 g  Status:  Discontinued        3.375 g 100 mL/hr over 30 Minutes Intravenous  Once 12/20/23 1945 12/20/23 1954   12/20/23 2000  cefTRIAXone  (ROCEPHIN ) 1 g in sodium chloride  0.9 % 100 mL IVPB  Status:  Discontinued        1 g 200 mL/hr over 30 Minutes Intravenous  Once 12/20/23 1954 12/20/23 2009   12/20/23 2000  metroNIDAZOLE  (FLAGYL ) IVPB 500 mg  Status:  Discontinued        500 mg 100 mL/hr over 60 Minutes Intravenous  Once 12/20/23 1954 12/20/23 2014        Assessment/Plan Acute sigmoid diverticulitis w/ microperf - CT 8/10 with above, no focal abscess, small amt free fluid in pelvis - WBC normal. 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  This care required moderate level of medical decision making.    Ozell CHRISTELLA Shaper, [facility] 12/24/2023, 10:33 AM Please see Amion for pager number during day hours 7:00am-4:30pm

## 2023-12-25 LAB — CBC WITH DIFFERENTIAL/PLATELET
Abs Immature Granulocytes: 0.03 K/uL (ref 0.00–0.07)
Basophils Absolute: 0 K/uL (ref 0.0–0.1)
Basophils Relative: 1 %
Eosinophils Absolute: 0.1 K/uL (ref 0.0–0.5)
Eosinophils Relative: 4 %
HCT: 28.3 % — ABNORMAL LOW (ref 39.0–52.0)
Hemoglobin: 9.8 g/dL — ABNORMAL LOW (ref 13.0–17.0)
Immature Granulocytes: 1 %
Lymphocytes Relative: 16 %
Lymphs Abs: 0.5 K/uL — ABNORMAL LOW (ref 0.7–4.0)
MCH: 28.9 pg (ref 26.0–34.0)
MCHC: 34.6 g/dL (ref 30.0–36.0)
MCV: 83.5 fL (ref 80.0–100.0)
Monocytes Absolute: 0.4 K/uL (ref 0.1–1.0)
Monocytes Relative: 13 %
Neutro Abs: 2.1 K/uL (ref 1.7–7.7)
Neutrophils Relative %: 65 %
Platelets: 191 K/uL (ref 150–400)
RBC: 3.39 MIL/uL — ABNORMAL LOW (ref 4.22–5.81)
RDW: 14.3 % (ref 11.5–15.5)
WBC: 3.3 K/uL — ABNORMAL LOW (ref 4.0–10.5)
nRBC: 0 % (ref 0.0–0.2)

## 2023-12-25 LAB — COMPREHENSIVE METABOLIC PANEL WITH GFR
ALT: 15 U/L (ref 0–44)
AST: 15 U/L (ref 15–41)
Albumin: 2.5 g/dL — ABNORMAL LOW (ref 3.5–5.0)
Alkaline Phosphatase: 187 U/L — ABNORMAL HIGH (ref 38–126)
Anion gap: 18 — ABNORMAL HIGH (ref 5–15)
BUN: 30 mg/dL — ABNORMAL HIGH (ref 8–23)
CO2: 21 mmol/L — ABNORMAL LOW (ref 22–32)
Calcium: 8 mg/dL — ABNORMAL LOW (ref 8.9–10.3)
Chloride: 102 mmol/L (ref 98–111)
Creatinine, Ser: 2.35 mg/dL — ABNORMAL HIGH (ref 0.61–1.24)
GFR, Estimated: 29 mL/min — ABNORMAL LOW (ref >60)
Glucose, Bld: 99 mg/dL (ref 70–99)
Potassium: 2.5 mmol/L — CL (ref 3.5–5.1)
Sodium: 141 mmol/L (ref 135–145)
Total Bilirubin: 0.7 mg/dL (ref 0.0–1.2)
Total Protein: 5.3 g/dL — ABNORMAL LOW (ref 6.5–8.1)

## 2023-12-25 LAB — GLUCOSE, CAPILLARY
Glucose-Capillary: 107 mg/dL — ABNORMAL HIGH (ref 70–99)
Glucose-Capillary: 127 mg/dL — ABNORMAL HIGH (ref 70–99)
Glucose-Capillary: 132 mg/dL — ABNORMAL HIGH (ref 70–99)
Glucose-Capillary: 141 mg/dL — ABNORMAL HIGH (ref 70–99)
Glucose-Capillary: 177 mg/dL — ABNORMAL HIGH (ref 70–99)
Glucose-Capillary: 98 mg/dL (ref 70–99)

## 2023-12-25 LAB — MAGNESIUM: Magnesium: 1.9 mg/dL (ref 1.7–2.4)

## 2023-12-25 MED ORDER — POTASSIUM CHLORIDE 20 MEQ PO PACK
40.0000 meq | PACK | Freq: Once | ORAL | Status: AC
  Administered 2023-12-25: 40 meq via ORAL
  Filled 2023-12-25: qty 2

## 2023-12-25 MED ORDER — INSULIN ASPART 100 UNIT/ML IJ SOLN: 0.0000 [IU] | Freq: Every day | INTRAMUSCULAR | Status: DC

## 2023-12-25 MED ORDER — MAGNESIUM SULFATE 2 GM/50ML IV SOLN
2.0000 g | Freq: Once | INTRAVENOUS | Status: AC
  Administered 2023-12-25: 2 g via INTRAVENOUS
  Filled 2023-12-25: qty 50

## 2023-12-25 MED ORDER — AMOXICILLIN-POT CLAVULANATE 875-125 MG PO TABS
1.0000 | ORAL_TABLET | Freq: Two times a day (BID) | ORAL | Status: DC
  Administered 2023-12-26: 1 via ORAL
  Filled 2023-12-25: qty 1

## 2023-12-25 MED ORDER — INSULIN ASPART 100 UNIT/ML IJ SOLN: 0.0000 [IU] | Freq: Three times a day (TID) | INTRAMUSCULAR | Status: DC

## 2023-12-25 MED ORDER — POTASSIUM CHLORIDE IN NACL 40-0.9 MEQ/L-% IV SOLN
INTRAVENOUS | Status: DC
  Filled 2023-12-25 (×5): qty 1000

## 2023-12-25 MED ORDER — PIPERACILLIN-TAZOBACTAM 3.375 G IVPB
3.3750 g | Freq: Three times a day (TID) | INTRAVENOUS | Status: AC
  Administered 2023-12-25 – 2023-12-26 (×2): 3.375 g via INTRAVENOUS
  Filled 2023-12-25 (×2): qty 50

## 2023-12-25 NOTE — Progress Notes (Signed)
 Progress Note     Subjective: No abdominal pain. Tolerating FLD. No n/v. BM today. He wants to go home.   Afebrile. WBC non-elevated on last check.   Objective: Vital signs in last 24 hours: Temp:  [98 F (36.7 C)-98.7 F (37.1 C)] 98 F (36.7 C) (08/15 0803) Pulse Rate:  [69-76] 69 (08/15 0803) Resp:  [16-18] 16 (08/15 0450) BP: (117-134)/(70-82) 117/76 (08/15 0803) SpO2:  [91 %-96 %] 91 % (08/15 0803) Last BM Date : 12/25/23  Intake/Output from previous day: 08/14 0701 - 08/15 0700 In: 100 [IV Piggyback:100] Out: 350 [Urine:350] Intake/Output this shift: No intake/output data recorded.  PE: General: pleasant, NAD Abd: soft, ND, NT  Lab Results:  Recent Labs    12/23/23 0501 12/25/23 0420  WBC 6.8 3.3*  HGB 10.3* 9.8*  HCT 30.9* 28.3*  PLT 186 191   BMET Recent Labs    12/24/23 0530 12/25/23 0420  NA 137 141  K 2.5* 2.5*  CL 107 102  CO2 20* 21*  GLUCOSE 102* 99  BUN 35* 30*  CREATININE 2.51* 2.35*  CALCIUM  8.3* 8.0*   PT/INR No results for input(s): LABPROT, INR in the last 72 hours. CMP     Component Value Date/Time   NA 141 12/25/2023 0420   K 2.5 (LL) 12/25/2023 0420   CL 102 12/25/2023 0420   CO2 21 (L) 12/25/2023 0420   GLUCOSE 99 12/25/2023 0420   BUN 30 (H) 12/25/2023 0420   CREATININE 2.35 (H) 12/25/2023 0420   CALCIUM  8.0 (L) 12/25/2023 0420   PROT 5.3 (L) 12/25/2023 0420   ALBUMIN  2.5 (L) 12/25/2023 0420   AST 15 12/25/2023 0420   ALT 15 12/25/2023 0420   ALKPHOS 187 (H) 12/25/2023 0420   BILITOT 0.7 12/25/2023 0420   GFRNONAA 29 (L) 12/25/2023 0420   GFRAA >60 12/19/2019 0309   Lipase     Component Value Date/Time   LIPASE 19 12/20/2023 1638       Studies/Results: No results found.   Anti-infectives: Anti-infectives (From admission, onward)    Start     Dose/Rate Route Frequency Ordered Stop   12/21/23 1000  Isavuconazonium Sulfate  CAPS 372 mg        372 mg Oral Daily 12/20/23 2030     12/21/23 0300   piperacillin -tazobactam (ZOSYN ) IVPB 3.375 g  Status:  Discontinued       Placed in Followed by Linked Group   3.375 g 12.5 mL/hr over 240 Minutes Intravenous Every 8 hours 12/20/23 2035 12/20/23 2053   12/21/23 0300  piperacillin -tazobactam (ZOSYN ) IVPB 3.375 g        3.375 g 12.5 mL/hr over 240 Minutes Intravenous Every 8 hours 12/20/23 2053     12/20/23 2045  piperacillin -tazobactam (ZOSYN ) IVPB 3.375 g  Status:  Discontinued       Placed in Followed by Linked Group   3.375 g 100 mL/hr over 30 Minutes Intravenous  Once 12/20/23 2035 12/20/23 2053   12/20/23 2015  cefTRIAXone  (ROCEPHIN ) 2 g in sodium chloride  0.9 % 100 mL IVPB  Status:  Discontinued        2 g 200 mL/hr over 30 Minutes Intravenous  Once 12/20/23 2009 12/20/23 2014   12/20/23 2000  piperacillin -tazobactam (ZOSYN ) IVPB 3.375 g  Status:  Discontinued        3.375 g 100 mL/hr over 30 Minutes Intravenous  Once 12/20/23 1945 12/20/23 1954   12/20/23 2000  cefTRIAXone  (ROCEPHIN ) 1 g in sodium chloride  0.9 % 100  mL IVPB  Status:  Discontinued        1 g 200 mL/hr over 30 Minutes Intravenous  Once 12/20/23 1954 12/20/23 2009   12/20/23 2000  metroNIDAZOLE  (FLAGYL ) IVPB 500 mg  Status:  Discontinued        500 mg 100 mL/hr over 60 Minutes Intravenous  Once 12/20/23 1954 12/20/23 2014        Assessment/Plan Acute sigmoid diverticulitis w/ microperf - CT 8/10 with above, no focal abscess, small amt free fluid in pelvis - WBC non-elevated. Afebrile and HD stable  - No indication for emergent surgical intervention - Advance to soft diet - If tolerates diet advancement, okay for discharge from our standpoint on oral abx. Would recommend general surgery follow up with transplant center for his diverticulitis. He reports he already has follow up with Gastroenterology [facility] well at [facility].   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  This care required moderate level of medical decision making.    Ozell CHRISTELLA Shaper, [facility] 12/25/2023, 10:55 AM Please see Amion for pager number during day hours 7:00am-4:30pm

## 2023-12-25 NOTE — Plan of Care (Signed)
   Problem: Education: Goal: Ability to describe self-care measures that may prevent or decrease complications (Diabetes Survival Skills Education) will improve Outcome: Progressing Goal: Individualized Educational Video(s) Outcome: Progressing

## 2023-12-25 NOTE — Plan of Care (Signed)
  Problem: Education: Goal: Ability to describe self-care measures that may prevent or decrease complications (Diabetes Survival Skills Education) will improve Outcome: Progressing   Problem: Coping: Goal: Ability to adjust to condition or change in health will improve Outcome: Progressing   Problem: Skin Integrity: Goal: Risk for impaired skin integrity will decrease Outcome: Progressing   Problem: Tissue Perfusion: Goal: Adequacy of tissue perfusion will improve Outcome: Progressing   Problem: Elimination: Goal: Will not experience complications related to bowel motility Outcome: Progressing   Problem: Nutrition: Goal: Adequate nutrition will be maintained Outcome: Progressing   Problem: Activity: Goal: Risk for activity intolerance will decrease Outcome: Progressing

## 2023-12-25 NOTE — Progress Notes (Signed)
 [facility]   ROUNDING   NOTE GLORIA LAMBERTSON FMW:[id number]  DOB: 1955-07-14  DOA: 12/20/2023  [facility]: [facility]  12/25/2023,3:56 PM  LOS: 5 days    Code Status: Full code     from: Home current Dispo: Likely home in 34 hours   68 year old white male 11/07/2023 STEMI with RCA and complete heart block + PPM + DAPT aspirin  Brilinta  1 year--monomorphic VT at that time treated with amnio lidocaine  per cardiology-not a LifeVest candidate and not a plan for ICD per EP at that time--last EF 40-45% Previous liver transplant 2022 [facility] on CellCept  250 twice daily hydrocortisone  variable dosing Rapamune  1 mg, isavuconazonium Chronic adrenal insufficiency  8/10 admit ABD pain + severe symptoms-Rx Zofran  and fentanyl  NS-white count never elevated Sodium 136 potassium 4.0 bicarb 18 BUN/creatinine 27/2.4 alk phos 312--WBC 7.6 hemoglobin 11.2 platelet 170 UA clean  General Surgery consulted 8/12 NG tube attempted but not placed  Continues to slowly improve  Plan  Diverticulitis complicated with ileus Continue Zosyn  every 8 + soft diet currently we will switch antibiotics tomorrow to oral Augmentin  to complete 14 days therapy EOT 8/23 Pain controlled Oxycodone  5 every 4 as needed Dilaudid  0.5-1 every 4 as needed  STEMI 10/18/2023 on DAPT-ischemic cardiomyopathy EF 40-45% Continue Toprol -XL 25 aspirin  81 Brilinta  90 twice daily Add back Zetia  10 Praluent  150 q. 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 Recheck labs in a.m.  hypokalemia + AKI superimposed on CKD IV Hypomagnesemia Fluids  changed to NaCl 100 cc/H with 40 of K given significant hypokalemia still--- continue K-Dur 40 twice daily patient given 3 g of IV mag  Recheck labs in the morning   Data Reviewed:   Sodium 141 potassium 2.5 magnesium  1.9 Bicarb 21 BUN/creatinine 30/2.3  WBC 3.3 hemoglobin 9.8 platelet 191  DVT prophylaxis: On ticagrelor   Status is: Inpatient Remains inpatient appropriate because:   Requires resolution of electrolyte abnormalities and probably can then discharge     Subjective: Looks well no vomiting beyond this morning meds and he is about to get them again No chest pain No fever Not passing flatus currently  Objective + exam Vitals:   12/24/23 1738 12/24/23 1932 12/25/23 0450 12/25/23 0803  BP: 131/78 134/70 133/82 117/76  Pulse: 70 76 69 69  Resp:  18 16   Temp: 98 F (36.7 C) 98.7 F (37.1 C) 98.3 F (36.8 C) 98 F (36.7 C)  TempSrc:  Oral Oral   SpO2: 92% 96% 95% 91%  Weight:      Height:       Filed Weights   12/20/23 1636 12/21/23 0355 12/22/23 0405  Weight: 92.5 kg 92.9 kg 94.3 kg     Examination:  Awake coherent alert in nad No distress Cta b no wheeze rales rhonchi Abd slight distension no rebound no guard  No Le edema Rom intact     Scheduled Meds:  aspirin   81 mg Oral Daily   docusate sodium   100 mg Oral BID   hydrocortisone   10 mg Oral Daily   And   hydrocortisone   5  mg Oral QHS   insulin  aspart  0-6 Units Subcutaneous Q4H   Isavuconazonium Sulfate   372 mg Oral Daily   metoprolol  succinate  25 mg Oral Daily   mexiletine  250 mg Oral Q12H   mycophenolate   250 mg Oral BID   pantoprazole  (PROTONIX ) IV  40 mg Intravenous Q24H   potassium chloride   40 mEq Oral BID   sertraline   100 mg Oral Daily   Sirolimus   1 mg Oral Daily   sodium chloride  flush  3 mL Intravenous Q12H   ticagrelor   90 mg Oral BID   Continuous Infusions:  0.9 % NaCl with KCl 40 mEq / L 100 mL/hr at 12/25/23 9092   piperacillin -tazobactam (ZOSYN )  IV 3.375 g  (12/25/23 1456)    Time 33  Jai-Gurmukh Bralyn Folkert, MD  [facility]

## 2023-12-26 LAB — CBC
HCT: 28 % — ABNORMAL LOW (ref 39.0–52.0)
Hemoglobin: 9.6 g/dL — ABNORMAL LOW (ref 13.0–17.0)
MCH: 29.2 pg (ref 26.0–34.0)
MCHC: 34.3 g/dL (ref 30.0–36.0)
MCV: 85.1 fL (ref 80.0–100.0)
Platelets: 193 K/uL (ref 150–400)
RBC: 3.29 MIL/uL — ABNORMAL LOW (ref 4.22–5.81)
RDW: 14.2 % (ref 11.5–15.5)
WBC: 3.7 K/uL — ABNORMAL LOW (ref 4.0–10.5)
nRBC: 0 % (ref 0.0–0.2)

## 2023-12-26 LAB — COMPREHENSIVE METABOLIC PANEL WITH GFR
ALT: 13 U/L (ref 0–44)
AST: 15 U/L (ref 15–41)
Albumin: 2.7 g/dL — ABNORMAL LOW (ref 3.5–5.0)
Alkaline Phosphatase: 170 U/L — ABNORMAL HIGH (ref 38–126)
Anion gap: 8 (ref 5–15)
BUN: 23 mg/dL (ref 8–23)
CO2: 20 mmol/L — ABNORMAL LOW (ref 22–32)
Calcium: 8.1 mg/dL — ABNORMAL LOW (ref 8.9–10.3)
Chloride: 111 mmol/L (ref 98–111)
Creatinine, Ser: 2.23 mg/dL — ABNORMAL HIGH (ref 0.61–1.24)
GFR, Estimated: 31 mL/min — ABNORMAL LOW (ref >60)
Glucose, Bld: 138 mg/dL — ABNORMAL HIGH (ref 70–99)
Potassium: 3.8 mmol/L (ref 3.5–5.1)
Sodium: 139 mmol/L (ref 135–145)
Total Bilirubin: 0.5 mg/dL (ref 0.0–1.2)
Total Protein: 5.3 g/dL — ABNORMAL LOW (ref 6.5–8.1)

## 2023-12-26 LAB — MAGNESIUM: Magnesium: 2.2 mg/dL (ref 1.7–2.4)

## 2023-12-26 LAB — GLUCOSE, CAPILLARY
Glucose-Capillary: 122 mg/dL — ABNORMAL HIGH (ref 70–99)
Glucose-Capillary: 146 mg/dL — ABNORMAL HIGH (ref 70–99)

## 2023-12-26 MED ORDER — MYCOPHENOLATE MOFETIL 250 MG PO CAPS: 250.0000 mg | ORAL_CAPSULE | Freq: Two times a day (BID) | ORAL | Status: AC

## 2023-12-26 MED ORDER — AMOXICILLIN-POT CLAVULANATE 875-125 MG PO TABS: 1.0000 | ORAL_TABLET | Freq: Two times a day (BID) | ORAL | 0 refills | Status: AC

## 2023-12-26 MED ORDER — POTASSIUM CHLORIDE CRYS ER 20 MEQ PO TBCR: 40.0000 meq | EXTENDED_RELEASE_TABLET | Freq: Two times a day (BID) | ORAL | 0 refills | Status: DC

## 2023-12-26 NOTE — Discharge Summary (Signed)
 Physician Discharge Summary  Randall Frazier FMW:[id number] DOB: April 15, 1956 DOA: 12/20/2023  PCP: [facility]  Admit date: 12/20/2023 Discharge date: 12/26/2023  Time spent: 26 minutes  Recommendations for Outpatient Follow-up:  Recommend completion of Augmentin  for diverticulitis Recommend Chem-7 CBC 1 week and close follow-up with Dr. Dalene of nephrology who manages his CKD Will need transplant labs done in the near future for sirolimus /CellCept  in the outpatient setting per his transplant team-he probably needs a discussion about whether he should remain on antifungals long-term once he sees them   Discharge Diagnoses:  MAIN problem for hospitalization \  Diverticulitis complicated by ileus  Please see below for itemized issues addressed in HOpsital- refer to other progress notes for clarity if needed  Discharge Condition: Improved  Diet recommendation: Renal heart healthy  Filed Weights   12/20/23 1636 12/21/23 0355 12/22/23 0405  Weight: 92.5 kg 92.9 kg 94.3 kg    History of present illness:  68 year old white male 11/07/2023 STEMI with RCA and complete heart block + PPM + DAPT aspirin  Brilinta  1 year--monomorphic VT at that time treated with amnio lidocaine  per cardiology-not a LifeVest candidate and not a plan for ICD per EP at that time--last EF 40-45% Previous liver transplant 2022 [facility] on CellCept  250 twice daily hydrocortisone  variable dosing Rapamune  1 mg, isavuconazonium Chronic adrenal insufficiency   8/10 admit ABD pain + severe symptoms-Rx Zofran  and fentanyl  NS-white count never elevated Sodium 136 potassium 4.0 bicarb 18 BUN/creatinine 27/2.4 alk phos 312--WBC 7.6 hemoglobin 11.2 platelet 170 UA clean   General Surgery consulted 8/12 NG tube attempted but not placed  Imp to the point of being able to discharge on 8/16   Plan   Diverticulitis complicated with ileus Continue Zosyn  every 8 until 8/15 and was switched to oral Augmentin  to complete 14  days therapy EOT 8/23-he is tolerating this well without significant fever or chills has a little bit of diarrhea but this is his baseline No pain meds recommended at discharge he is not in pain   STEMI 10/18/2023 on DAPT-ischemic cardiomyopathy EF 40-45% Continue Toprol -XL 25 aspirin  81 Brilinta  90 twice daily and Zetia  Praluent  150 q. 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 Vinton had renal insufficiency after his liver transplant which did not completely recover which is the etiology of this] Hypomagnesemia IV fluids with 40 of K and he was significantly hypokalemic so we discharged him on a small amount of K-Dur 40 twice daily at discharge replaced of magnesium  aggressively  ?  CBD stone Apparently going for ERCP at some point in the near future per his other physicians--from my end he is stable but I have told him to follow-up closely  Mild leukopenia ?  Etiology-no fevers no other issues and isolated- Differential done showed slightly decreased lymphocytes which could be seen with  his transplant meds-needs follow-up labs in the outpatient  Discharge Exam: Vitals:   12/26/23 0818 12/26/23 1257  BP: 130/75 133/64  Pulse: 63 60  Resp: 18 18  Temp: 99 F (37.2 C) 98.6 F (37 C)  SpO2: 100%  96%    Subj on day of d/c   EOMI NCAT no focal deficit no icterus no pallor no wheeze no rales no rhonchi Looks well feels fair eating and drinking well  General Exam on discharge  EOMI NCAT thick neck Mallampati 4 postop changes to face Neck soft supple S1-S2 no murmur no rub no gallop Abdomen soft no rebound no guarding ROM intact Power 5/5   neuro intact   Discharge Instructions   Discharge Instructions     Diet - low sodium heart healthy   Complete by: As directed    Discharge instructions   Complete by: As directed    You should follow-up in the outpatient setting with your transplant physicians-they may want to repeat levels of various drugs that you are on Please ask them about the antifungal that you are on as it seems like you are on this for suppressive therapy It would be a good idea for you to finish all of the Augmentin  that we have prescribed-if you have abdominal pain fever chills or inability to pass stool please notify me immediately Please make an appointment to follow-up with Dr. Dalene of nephrology he will check your kidney function etc.-this does not need to be immediate, you can follow-up with your primary physician for routine labs but you will need a potassium level in the next 5 to 7 days Keep your other appointments with your cardiologist and gastroenterologist for other issues that you mentioned Best of luck   Increase activity slowly   Complete by: As directed       Allergies as of 12/26/2023       Reactions   Bromsite [bromfenac Sodium] Other (See Comments)   Kidney disorder; serum creatinine raised.   Desyrel  [trazodone ] Nausea Only   Lotensin [benazepril] Cough   Mobic [meloxicam] Other (See Comments)   Hx liver transplant   Neurontin [gabapentin] Swelling   Nsaids Other (See Comments)   Kidney Disorder; serum creatinine above reference range   Pork Allergy Other (See Comments)   Gout flares   Pravachol [pravastatin] Other (See  Comments)   Liver enzymes abnormal; outside reference range Myalgias   Statins Other (See Comments)   Liver enzymes abnormal; outside reference range  Myalgias Tried on Pravachol and Zocor   Toradol  [ketorolac  Tromethamine ] Other (See Comments)   Kidney disorder; serum creatinine above reference range   Zocor [simvastatin] Other (See Comments)   Liver enzymes abnormal; outside reference range Myalgias   Niaspan [niacin] Other (See Comments)   Vasomotor symptoms   Zestril [lisinopril] Swelling, Dermatitis, Rash, Other (See Comments)        Medication List     TAKE these medications    amoxicillin -clavulanate 875-125 MG tablet Commonly known as: AUGMENTIN  Take 1 tablet by mouth every 12 (twelve) hours for 7 days.   aspirin  81 MG chewable tablet Chew 81 mg by mouth daily.   Cresemba  186 MG Caps Generic drug: Isavuconazonium Sulfate  Take 372 mg by mouth daily.   ezetimibe  10 MG tablet Commonly known as: ZETIA  Take 1 tablet (10 mg total) by mouth daily.   hydrocortisone  10 MG tablet Commonly known as: CORTEF  Take 5-10 mg by mouth See admin instructions. Take 1 tablet (10mg ) by mouth in  the morning and take 1/2 tablet (5mg ) at bedtime.   lidocaine  4 % Place 1 patch onto the skin daily as needed (pain).   metoprolol  succinate 25 MG 24 hr tablet Commonly known as: TOPROL -XL Take 1 tablet (25 mg total) by mouth daily.   mexiletine 250 MG capsule Commonly known as: MEXITIL  Take 1 capsule (250 mg total) by mouth every 12 (twelve) hours.   mycophenolate  250 MG capsule Commonly known as: CELLCEPT  Take 1 capsule (250 mg total) by mouth 2 (two) times daily. What changed: how much to take   nitroGLYCERIN  0.4 MG SL tablet Commonly known as: NITROSTAT  Place 1 tablet (0.4 mg total) under the tongue every 5 (five) minutes x 3 doses as needed for chest pain.   omega-3 acid ethyl esters 1 g capsule Commonly known as: LOVAZA  Take 2 capsules (2 g total) by mouth 2 (two) times  daily.   omeprazole 40 MG capsule Commonly known as: PRILOSEC Take 40 mg by mouth daily.   polyethylene glycol powder 17 GM/SCOOP powder Commonly known as: GLYCOLAX /MIRALAX  Take 17 g by mouth daily as needed for severe constipation.   potassium chloride  SA 20 MEQ tablet Commonly known as: KLOR-CON  M Take 2 tablets (40 mEq total) by mouth 2 (two) times daily.   Praluent  150 MG/ML Soaj Generic drug: Alirocumab  Inject 1 mL (150 mg total) into the skin every 14 (fourteen) days.   sertraline  100 MG tablet Commonly known as: ZOLOFT  Take 100 mg by mouth daily.   sirolimus  1 MG tablet Commonly known as: RAPAMUNE  Take 1 mg by mouth daily.   ticagrelor  90 MG Tabs tablet Commonly known as: BRILINTA  Take 1 tablet (90 mg total) by mouth 2 (two) times daily.       Allergies  Allergen Reactions   Bromsite [Bromfenac Sodium] Other (See Comments)    Kidney disorder; serum creatinine raised.   Desyrel  [Trazodone ] Nausea Only   Lotensin [Benazepril] Cough   Mobic [Meloxicam] Other (See Comments)    Hx liver transplant   Neurontin [Gabapentin] Swelling   Nsaids Other (See Comments)    Kidney Disorder; serum creatinine above reference range   Pork Allergy Other (See Comments)    Gout flares   Pravachol [Pravastatin] Other (See Comments)    Liver enzymes abnormal; outside reference range Myalgias   Statins Other (See Comments)    Liver enzymes abnormal; outside reference range  Myalgias Tried on Pravachol and Zocor   Toradol  [Ketorolac  Tromethamine ] Other (See Comments)    Kidney disorder; serum creatinine above reference range   Zocor [Simvastatin] Other (See Comments)    Liver enzymes abnormal; outside reference range Myalgias   Niaspan [Niacin] Other (See Comments)    Vasomotor symptoms   Zestril [Lisinopril] Swelling, Dermatitis, Rash and Other (See Comments)      The results of significant diagnostics from this hospitalization (including imaging, microbiology,  ancillary and laboratory) are listed below for reference.    Significant Diagnostic Studies: DG Abd Portable 1V Result Date: 12/22/2023 CLINICAL DATA:  Nausea and vomiting. EXAM: PORTABLE ABDOMEN - 1 VIEW COMPARISON:  CT 12/20/2023 FINDINGS: Increasing gaseous distention of large and small bowel. No convincing pneumoperitoneum on supine views. Micro perforation on CT is not assessed by radiograph. Right upper quadrant surgical clips. IMPRESSION: Increasing gaseous distention of large and small bowel, favor ileus. Electronically Signed   By: Andrea Gasman M.D.   On: 12/22/2023 14:27   CT ABDOMEN PELVIS WO CONTRAST Result Date: 12/20/2023 CLINICAL DATA:  Acute abdominal pain EXAM:  CT ABDOMEN AND PELVIS WITHOUT CONTRAST TECHNIQUE: Multidetector CT imaging of the abdomen and pelvis was performed following the standard protocol without IV contrast. RADIATION DOSE REDUCTION: This exam was performed according to the departmental dose-optimization program which includes automated exposure control, adjustment of the mA and/or kV according to patient size and/or use of iterative reconstruction technique. COMPARISON:  None Available. FINDINGS: Lower chest: No acute abnormality. Hepatobiliary: No focal liver abnormality is seen. There surgical clips in the region of the IVC and portal vein. Status post cholecystectomy. No biliary dilatation. Pancreas: Unremarkable. No pancreatic ductal dilatation or surrounding inflammatory changes. Spleen: Normal in size without focal abnormality. Adrenals/Urinary Tract: Adrenal glands are unremarkable. Kidneys are normal, without renal calculi, focal lesion, or hydronephrosis. Bladder is unremarkable. Stomach/Bowel: There is moderate length segment of sigmoid colon wall thickening with marked surrounding inflammatory stranding. A few diverticula are seen in this region. Micro perforation present image 6/59. No focal abscess. No bowel obstruction. Appendix is within normal limits.  Small bowel and stomach are within normal limits. Vascular/Lymphatic: Aortic atherosclerosis. No enlarged abdominal or pelvic lymph nodes. Reproductive: Prostate is unremarkable. Other: There is a small amount of free fluid in the pelvis. There is a small fat containing umbilical hernia. Musculoskeletal: Sternotomy wires are present. No acute fractures are seen. IMPRESSION: 1. Acute sigmoid colon diverticulitis versus colitis with micro perforation. No focal abscess. 2. Small amount of free fluid in the pelvis. Aortic Atherosclerosis (ICD10-I70.0). Electronically Signed   By: Greig Pique M.D.   On: 12/20/2023 19:41    Microbiology: No results found for this or any previous visit (from the past 240 hours).   Labs: Basic Metabolic Panel: Recent Labs  Lab 12/21/23 0118 12/22/23 0217 12/23/23 0501 12/24/23 0530 12/24/23 0850 12/25/23 0420 12/26/23 0504  NA 133* 135 137 137  --  141 139  K 3.5 4.2 3.0* 2.5*  --  2.5* 3.8  CL 103 103 109 107  --  102 111  CO2 18* 20* 14* 20*  --  21* 20*  GLUCOSE 194* 146* 149* 102*  --  99 138*  BUN 24* 26* 34* 35*  --  30* 23  CREATININE 2.01* 2.54* 2.75* 2.51*  --  2.35* 2.23*  CALCIUM  8.5* 8.5* 8.5* 8.3*  --  8.0* 8.1*  MG 1.7  --  2.0  --  1.9 1.9 2.2   Liver Function Tests: Recent Labs  Lab 12/20/23 1638 12/21/23 0118 12/25/23 0420 12/26/23 0504  AST 32 33 15 15  ALT 40 40 15 13  ALKPHOS 312* 313* 187* 170*  BILITOT 1.0 1.0 0.7 0.5  PROT 6.7 6.6 5.3* 5.3*  ALBUMIN  3.5 3.3* 2.5* 2.7*   Recent Labs  Lab 12/20/23 1638  LIPASE 19   No results for input(s): AMMONIA in the last 168 hours. CBC: Recent Labs  Lab 12/20/23 1705 12/21/23 0118 12/22/23 0217 12/23/23 0501 12/25/23 0420 12/26/23 0504  WBC 7.6 10.9* 8.9 6.8 3.3* 3.7*  NEUTROABS 5.8  --   --   --  2.1  --   HGB 11.2* 12.6* 9.6* 10.3* 9.8* 9.6*  HCT 34.2* 37.6* 28.8* 30.9* 28.3* 28.0*  MCV 87.0 85.6 86.2 86.6 83.5 85.1  PLT 170 205 154 186 191 193   Cardiac  Enzymes: No results for input(s): CKTOTAL, CKMB, CKMBINDEX, TROPONINI in the last 168 hours. BNP: BNP (last 3 results) No results for input(s): BNP in the last 8760 hours.  ProBNP (last 3 results) No results for input(s): PROBNP in the last  8760 hours.  CBG: Recent Labs  Lab 12/25/23 1201 12/25/23 1651 12/25/23 2126 12/26/23 0815 12/26/23 1254  GLUCAP 132* 141* 177* 122* 146*    Signed:  Colen Grimes MD   [facility] 12/26/2023, 2:39 PM

## 2023-12-26 NOTE — Plan of Care (Signed)

## 2024-01-01 NOTE — Telephone Encounter (Signed)
 Spoke with Woodie Sharps, NP with liver transplant team to confirm that patient may not hold Brilinta  for surgery. Must stay on this for one year. STEMI was on 11/07/2023.  She verbalized understanding and will notify surgeon concerning our recommendation.

## 2024-01-01 NOTE — Telephone Encounter (Signed)
 I will forward this call the preop APP as the notes state wanting to s/w the preop provider.

## 2024-01-01 NOTE — Telephone Encounter (Signed)
 Woodie Sharps NP - [facility] called, she would like to speak with preop provider to discuss the patient's upcoming procedure. She said to call her back at [phone]

## 2024-01-03 NOTE — Progress Notes (Unsigned)
 Cardiology Office Note:  .   Date:  01/03/2024  ID:  Amin, Fornwalt 1956/01/21, MRN [id number] PCP: [facility]  [facility] Providers Cardiologist:  Ozell Fell, MD {  History of Present Illness: .   Randall Frazier is a 68 y.o. male w/PMHx of  liver transplant (in 2022 at [facility] on immunosupression), autoimmune disease, and chronic fungal sinusitis  DM, gout, HTN, CKD (IIIb) CAD (CABG ~ 8 years ago) STEMI > PCI w/DES to RCA 11/07/23  Admitted 11/07/23  with acute onset CP, diaphoresis, EMS called > EKG c/w IW STEMI bradycardic with intermittent CHB and brought directly to the cath lab Cath/PCI  Distal RCA 100% thrombotic occlusion at baseline, treated with primary PCI necessitating a guide extension catheter, with 0% residual stenosis and TIMI-3 flow after stenting with a 3.0 x 20 mm Synergy DES  2.  Severe distal left main stenosis 3.  Total occlusion of the proximal LAD with patent LIMA to LAD graft 4.  Severe proximal and mid circumflex stenoses with a patent saphenous vein graft to ramus intermedius/OM1 5.  Complete heart block complicating acute MI, necessitating temporary venous pacing but resolving after reperfusion 6.  Normal LVEDP 12 mmHg, serial lactate 1.8--->1.5   TTE w/LVEF 40%; appears to have inferoseptal/apical hypokinesis.  He did require temp wire (intra-procedurally) > quickly removed BB avoided Out of ICU 6/29 Developed WCT, felt to be slow VT overnight, largely asymptomatic Tx with amio 150 bolus, lido 100mg , and IV mag > SR EP brought on board Lidocaine  > mexiletine VT occurred withing 48 hours of his STEMI/PCI and not indicated for ICD Also noting increased risk for implant given -- immunosuppression -- inability to stop DAPT -- chronic fungal sinusitis (s/p bone grafting) Planned for EP f/u in ~ 3 mo > likely to stop mexilletine at that visit Discharged 11/11/23  Saw Hao 11/23/23, feeling well, his BB dose reduced given reports of  bradycardia HRs 50's at home  Pre-op Pool with discussion pending ERCP and request for Dr. Fell did NOT clear to hold brlinita,, and would need an in  clinic visit to discuss  > to continue uninterrupted for 12 mo post intervention/STEMI  Admitted 12/20/23 Diverticulitis complicated with ileus  AKI on CKD Followed by nephrology as well hypokalemic > home with K+ Discharged 12/26/23   Today's visit is scheduled as a post hospital visit ROS:   He comes today with his wife (she did  med tech/corpsman in the [employer]) They report his GI/hepatologist/transplant team have cancelled his ERCP > plans to try a medication.  Still a bit sluggish since his diverticulitis hospitalization, but improving  No CP, symptoms of his MI No palpitations or cardiac awareness No rest SOB, some mild DOE No dizzy spells, near syncope or syncope  He took mexiletine only for a month (no refills), had been off it ~ 1 mo  He has seen his kidney specialist post discharge and labs/GFR have improved some   Arrhythmia/AAD hx Slow HD tolerated VT post STEMI June 2025 > lidocaine  gtt > mexiletine Given occurred w/I 48hors of STEMI/intervention not felt to have indication for ICD  Studies Reviewed: SABRA    EKG done today and reviewed by myself SR 71bpm, T changes appear similar to prior  Cath/PCI 11/07/23 Distal RCA 100% thrombotic occlusion at baseline, treated with primary PCI necessitating a guide extension catheter, with 0% residual stenosis and TIMI-3 flow after stenting with a 3.0 x 20 mm Synergy DES  2.  Severe distal left main stenosis 3.  Total occlusion of the proximal LAD with patent LIMA to LAD graft 4.  Severe proximal and mid circumflex stenoses with a patent saphenous vein graft to ramus intermedius/OM1 5.  Complete heart block complicating acute MI, necessitating temporary venous pacing but resolving after reperfusion 6.  Normal LVEDP 12 mmHg, serial lactate 1.8--->1.5   11/08/23:  TTE 1. There is no left ventricular thrombus (definity  contrast was used).  Left ventricular ejection fraction, by estimation, is 40 to 45%. The left  ventricle has mildly decreased function. The left ventricle demonstrates  regional wall motion abnormalities  (see scoring diagram/findings for description). Left ventricular diastolic  parameters are consistent with Grade I diastolic dysfunction (impaired  relaxation). There is severe hypokinesis of the left ventricular,  mid-apical inferoseptal wall, inferior  wall and apical segment.   2. Right ventricular systolic function is moderately reduced. The right  ventricular size is moderately enlarged. The estimated right ventricular  systolic pressure is 16.1 mmHg.   3. Left atrial size was mildly dilated.   4. The mitral valve is normal in structure. Moderate mitral valve  regurgitation. No evidence of mitral stenosis.   5. The aortic valve is grossly normal. Aortic valve regurgitation is not  visualized. No aortic stenosis is present.   6. Evidence of atrial level shunting detected by color flow Doppler.   Comparison(s): Prior images reviewed side by side. Changes from prior  study are noted. The left ventricular function is worsened. The left  ventricular wall motion new. There is interval reduction in left  ventricular function due to infarction in the  distribution of the right coronary artery.      Risk Assessment/Calculations:    Physical Exam:   VS:  There were no vitals taken for this visit.   Wt Readings from Last 3 Encounters:  12/22/23 207 lb 14.3 oz (94.3 kg)  11/23/23 218 lb 9.6 oz (99.2 kg)  11/07/23 216 lb (98 kg)    GEN: Well nourished, well developed in no acute distress NECK: No JVD; No carotid bruits CARDIAC: RRR, no murmurs, rubs, gallops RESPIRATORY:   CTA b/l without rales, wheezing or rhonchi  ABDOMEN: Soft, non-tender, non-distended EXTREMITIES: trace edema; No deformity   ASSESSMENT AND PLAN: .     VT Was slow and HD tolerated, occurred w/I 48 hours of STEMI/interventoin Not felt to have indication for ICD LVEF 40's No symptoms of arrhythmia He has been off mexiletine for a month, was intended to likely be a short term treatment > will not restart it  2. CAD 3. ICM Has Dr. Wonda follow up in place   Dispo: I think he can see EP PRN, keep Dr. Margurite visit, sooner if needed  Signed, Randall Macario Arthur, PA-C

## 2024-01-06 ENCOUNTER — Ambulatory Visit: Attending: Physician Assistant | Admitting: Physician Assistant

## 2024-01-06 ENCOUNTER — Encounter: Payer: Self-pay | Admitting: Physician Assistant

## 2024-01-06 VITALS — BP 134/76 | HR 71 | Ht 72.0 in | Wt 209.2 lb

## 2024-01-06 DIAGNOSIS — I2510 Atherosclerotic heart disease of native coronary artery without angina pectoris: Secondary | ICD-10-CM | POA: Diagnosis not present

## 2024-01-06 DIAGNOSIS — I255 Ischemic cardiomyopathy: Secondary | ICD-10-CM | POA: Diagnosis not present

## 2024-01-06 DIAGNOSIS — I472 Ventricular tachycardia, unspecified: Secondary | ICD-10-CM | POA: Diagnosis not present

## 2024-01-06 NOTE — Patient Instructions (Signed)
 Medication Instructions:   Your physician recommends that you continue on your current medications as directed. Please refer to the Current Medication list given to you today.  *If you need a refill on your cardiac medications before your next appointment, please call your pharmacy*  Lab Work: NONE ORDERED  TODAY    If you have labs (blood work) drawn today and your tests are completely normal, you will receive your results only by: MyChart Message (if you have MyChart) OR A paper copy in the mail If you have any lab test that is abnormal or we need to change your treatment, we will call you to review the results.  Testing/Procedures: NONE ORDERED  TODAY   Follow-Up: At [facility], you and your health needs are our priority.  As part of our continuing mission to provide you with exceptional heart care, our providers are all part of one team.  This team includes your primary Cardiologist (physician) and Advanced Practice Providers or APPs (Physician Assistants and Nurse Practitioners) who all work together to provide you with the care you need, when you need it.  Your next appointment: CONTACT [facility] [phone] AS NEEDED FOR  ANY CARDIAC RELATED SYMPTOMS   We recommend signing up for the patient portal called MyChart.  Sign up information is provided on this After Visit Summary.  MyChart is used to connect with patients for Virtual Visits (Telemedicine).  Patients are able to view lab/test results, encounter notes, upcoming appointments, etc.  Non-urgent messages can be sent to your provider as well.   To learn more about what you can do with MyChart, go to [email or web address].   Other Instructions

## 2024-01-07 ENCOUNTER — Encounter (HOSPITAL_COMMUNITY)
Admission: RE | Admit: 2024-01-07 | Discharge: 2024-01-07 | Disposition: A | Discharge status: Home / Self Care | Source: Ambulatory Visit | Attending: Cardiovascular Disease | Admitting: Cardiovascular Disease

## 2024-01-07 VITALS — BP 130/70 | HR 64 | Ht 72.0 in | Wt 208.6 lb

## 2024-01-07 DIAGNOSIS — Z955 Presence of coronary angioplasty implant and graft: Secondary | ICD-10-CM | POA: Insufficient documentation

## 2024-01-07 DIAGNOSIS — I2111 ST elevation (STEMI) myocardial infarction involving right coronary artery: Secondary | ICD-10-CM | POA: Diagnosis present

## 2024-01-07 NOTE — Progress Notes (Signed)
 Cardiac Rehab Medication Review   Does the patient  feel that his/her medications are working for him/her? Yes    Has the patient been experiencing any side effects to the medications prescribed? No  Does the patient measure his/her own blood pressure or blood glucose at home?   Yes  Does the patient have any problems obtaining medications due to transportation or finances?   No  Understanding of regimen: good Understanding of indications: good Potential of compliance: good    Comments: Octaviano understands his medications/regime well. His wife also helps him manage it. He checks his BP every other day, CBG occasionally.    Con KATHEE Pereyra, MS, ACSM-CEP 01/07/2024 11:17 AM

## 2024-01-07 NOTE — Progress Notes (Signed)
 Cardiac Individual Treatment Plan  Patient Details  Name: Randall Frazier MRN: [id number] Date of Birth: 06-01-1955 Referring Provider:   Flowsheet Row INTENSIVE CARDIAC REHAB ORIENT from 01/07/2024 in [facility]  Referring Provider Ozell Fell, MD    Initial Encounter Date:  Flowsheet Row INTENSIVE CARDIAC REHAB ORIENT from 01/07/2024 in [facility]  Date 01/07/24    Visit Diagnosis: 11/07/23 STEMI, DES RCA  11/07/23 DES RCA  Patient's Home Medications on Admission:  Current Outpatient Medications:    Alirocumab  (PRALUENT ) 150 MG/ML SOAJ, Inject 1 mL (150 mg total) into the skin every 14 (fourteen) days., Disp: 2 mL, Rfl: 11   Artificial Tears ophthalmic solution, Place 1 drop into both eyes as needed for dry eyes., Disp: , Rfl:    aspirin  81 MG chewable tablet, Chew 81 mg by mouth daily., Disp: , Rfl:    coal tar (NEUTROGENA T-GEL) 0.5 % shampoo, Apply 1 Application topically at bedtime as needed (psorasis)., Disp: , Rfl:    doxazosin (CARDURA) 1 MG tablet, Take 1 mg by mouth daily., Disp: , Rfl:    ezetimibe  (ZETIA ) 10 MG tablet, Take 1 tablet (10 mg total) by mouth daily. (Patient taking differently: Take 1 tablet (10 mg total) by mouth daily.), Disp: 90 tablet, Rfl: 3   hydrocortisone  (CORTEF ) 10 MG tablet, Take 5-10 mg by mouth See admin instructions. Take 1 tablet (10mg ) by mouth in the morning and take 1/2 tablet (5mg ) at bedtime., Disp: , Rfl:    Isavuconazonium Sulfate  (CRESEMBA ) 186 MG CAPS, Take 372 mg by mouth daily., Disp: , Rfl:    lidocaine  4 %, Place 1 patch onto the skin daily as needed (pain)., Disp: , Rfl:    metoprolol  succinate (TOPROL -XL) 25 MG 24 hr tablet, Take 1 tablet (25 mg total) by mouth daily., Disp: 180 tablet, Rfl: 3   mycophenolate  (CELLCEPT ) 250 MG capsule, Take 1 capsule (250 mg total) by mouth 2 (two) times daily., Disp: , Rfl:     nitroGLYCERIN  (NITROSTAT ) 0.4 MG SL tablet, Place 1 tablet (0.4 mg total) under the tongue every 5 (five) minutes x 3 doses as needed for chest pain., Disp: 25 tablet, Rfl: 0   omeprazole (PRILOSEC) 40 MG capsule, Take 40 mg by mouth daily., Disp: , Rfl:    polyethylene glycol powder (GLYCOLAX /MIRALAX ) 17 GM/SCOOP powder, Take 17 g by mouth daily as needed for severe constipation., Disp: , Rfl:    sertraline  (ZOLOFT ) 100 MG tablet, Take 100 mg by mouth daily., Disp: , Rfl:    sildenafil (VIAGRA) 100 MG tablet, Take 100 mg by mouth daily as needed for erectile dysfunction., Disp: , Rfl:    silver sulfADIAZINE (SILVADENE) 1 % cream, Apply 1 Application topically daily., Disp: , Rfl:    ticagrelor  (BRILINTA ) 90 MG TABS tablet, Take 1 tablet (90 mg total) by mouth 2 (two) times daily., Disp: 180 tablet, Rfl: 3   triamcinolone cream (KENALOG) 0.1 %, Apply 1 Application topically as needed (rash arms)., Disp: , Rfl:    mexiletine (MEXITIL ) 250 MG capsule, Take 1 capsule (250 mg total) by mouth every 12 (twelve) hours. (Patient not taking: Reported on 01/07/2024), Disp: 60 capsule, Rfl: 0   omega-3 acid ethyl esters (LOVAZA ) 1 g capsule, Take 2 capsules (2 g total) by mouth 2 (two) times daily. (Patient not taking: Reported on 01/07/2024), Disp: 360 capsule, Rfl: 3   potassium chloride  SA (  KLOR-CON  M) 20 MEQ tablet, Take 2 tablets (40 mEq total) by mouth 2 (two) times daily. (Patient not taking: Reported on 01/07/2024), Disp: 6 tablet, Rfl: 0   sirolimus  (RAPAMUNE ) 1 MG tablet, Take 1 mg by mouth daily. (Patient not taking: Reported on 01/07/2024), Disp: , Rfl:   Past Medical History: Past Medical History:  Diagnosis Date   Arthritis    shoulder- both, neck, spine, GOUT   Diabetes mellitus    Gout    H/O exercise stress test    many yrs. ago, no need for f/u with cardiac    Hx of CABG    x2   Hypertension    Liver transplant recipient 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 weight loss -over 18 mon. period     Tobacco Use: Social History   Tobacco Use  Smoking Status Never  Smokeless Tobacco Never    Labs: Review Flowsheet  More data exists      Latest Ref Rng & Units 12/15/2019 02/15/2021 03/23/2021 11/07/2023 11/08/2023  Labs for ITP Cardiac and Pulmonary Rehab  Cholestrol 0 - 200 mg/dL 876  - - 715  739   LDL (calc) 0 - 99 mg/dL 84  - - UNABLE TO CALCULATE IF TRIGLYCERIDE OVER 400 mg/dL  UNABLE TO CALCULATE IF TRIGLYCERIDE OVER 400 mg/dL   Direct LDL 0 - 99 mg/dL - - - 839  844   HDL-C >40 mg/dL 16  - - 28  24   Trlycerides <150 mg/dL 884  - - 291  477   Hemoglobin A1c 4.8 - 5.6 % 6.0  - - - 7.9   TCO2 22 - 32 mmol/L - 23  19  20   -    Capillary Blood Glucose: Lab Results  Component Value Date   GLUCAP 146 (H) 12/26/2023   GLUCAP 122 (H) 12/26/2023   GLUCAP 177 (H) 12/25/2023   GLUCAP 141 (H) 12/25/2023   GLUCAP 132 (H) 12/25/2023     Exercise Target Goals: Exercise Program Goal: Individual exercise prescription set using results from initial 6 min walk test and THRR while considering  patient's activity barriers and safety.   Exercise Prescription Goal: Initial exercise prescription builds to 30-45 minutes a day of aerobic activity, 2-3 days per week.  Home exercise guidelines will be given to patient during program as part of exercise prescription that the participant will acknowledge.  Activity Barriers & Risk Stratification:   6 Minute Walk:  6 Minute Walk     Row Name 01/07/24 1436         6 Minute Walk   Phase Initial     Distance 1410 feet     Walk Time 6 minutes     # of Rest Breaks 0     MPH 2.67     METS 3.17     RPE 12     Perceived Dyspnea  0     VO2 Peak 11.1     Symptoms Yes (comment)     Comments a little winded, no other s/sx     Resting HR 64 bpm     Resting BP 130/70     Resting Oxygen Saturation  100 %     Exercise Oxygen Saturation  during 6 min walk 100 %     Max Ex. HR  92 bpm     Max Ex. BP 142/70  2 Minute Post BP 128/74        Oxygen Initial Assessment:   Oxygen Re-Evaluation:   Oxygen Discharge (Final Oxygen Re-Evaluation):   Initial Exercise Prescription:  Initial Exercise Prescription - 01/07/24 1400       Date of Initial Exercise RX and Referring Provider   Date 01/07/24    Referring Provider Ozell Fell, MD    Expected Discharge Date 04/02/23      NuStep   Level 1    SPM 70    Minutes 15    METs 2      Track   Laps 15    Minutes 15    METs 2.4      Prescription Details   Frequency (times per week) 3    Duration Progress to 30 minutes of continuous aerobic without signs/symptoms of physical distress      Intensity   THRR 40-80% of Max Heartrate 61-122    Ratings of Perceived Exertion 11-13    Perceived Dyspnea 0-4      Progression   Progression Continue progressive overload as per policy without signs/symptoms or physical distress.      Resistance Training   Training Prescription Yes    Weight 2    Reps 10-15          Perform Capillary Blood Glucose checks as needed.  Exercise Prescription Changes:   Exercise Comments:   Exercise Goals and Review:   Exercise Goals     Row Name 01/07/24 1447             Exercise Goals   Increase Physical Activity Yes       Intervention Provide advice, education, support and counseling about physical activity/exercise needs.;Develop an individualized exercise prescription for aerobic and resistive training based on initial evaluation findings, risk stratification, comorbidities and participant's personal goals.       Expected Outcomes Short Term: Attend rehab on a regular basis to increase amount of physical activity.;Long Term: Exercising regularly at least 3-5 days a week.;Long Term: Add in home exercise to make exercise part of routine and to increase amount of physical activity.       Increase Strength and Stamina Yes       Intervention Provide advice,  education, support and counseling about physical activity/exercise needs.;Develop an individualized exercise prescription for aerobic and resistive training based on initial evaluation findings, risk stratification, comorbidities and participant's personal goals.       Expected Outcomes Short Term: Increase workloads from initial exercise prescription for resistance, speed, and METs.;Short Term: Perform resistance training exercises routinely during rehab and add in resistance training at home;Long Term: Improve cardiorespiratory fitness, muscular endurance and strength as measured by increased METs and functional capacity ( )       Able to understand and use rate of perceived exertion (RPE) scale Yes       Intervention Provide education and explanation on how to use RPE scale       Expected Outcomes Short Term: Able to use RPE daily in rehab to express subjective intensity level;Long Term:  Able to use RPE to guide intensity level when exercising independently       Knowledge and understanding of Target Heart Rate Range (THRR) Yes       Intervention Provide education and explanation of THRR including how the numbers were predicted and where they are located for reference       Expected Outcomes Long Term: Able to use THRR to govern intensity when exercising independently;Short  Term: Able to state/look up THRR;Short Term: Able to use daily as guideline for intensity in rehab       Understanding of Exercise Prescription Yes       Intervention Provide education, explanation, and written materials on patient's individual exercise prescription       Expected Outcomes Short Term: Able to explain program exercise prescription;Long Term: Able to explain home exercise prescription to exercise independently          Exercise Goals Re-Evaluation :   Discharge Exercise Prescription (Final Exercise Prescription Changes):   Nutrition:  Target Goals: Understanding of nutrition guidelines, daily intake of  sodium 1500mg , cholesterol 200mg , calories 30% from fat and 7% or less from saturated fats, daily to have 5 or more servings of fruits and vegetables.  Biometrics:  Pre Biometrics - 01/07/24 1416       Pre Biometrics   Waist Circumference 40 inches    Hip Circumference 40.5 inches    Waist to Hip Ratio 0.99 %    Triceps Skinfold 26 mm    % Body Fat 29.7 %    Grip Strength 24 kg    Flexibility 0 in   not done   Single Leg Stand 3.5 seconds           Nutrition Therapy Plan and Nutrition Goals:   Nutrition Assessments:  MEDIFICTS Score Key: >=70 Need to make dietary changes  40-70 Heart Healthy Diet <= 40 Therapeutic Level Cholesterol Diet    Picture Your Plate Scores: <59 Unhealthy dietary pattern with much room for improvement. 41-50 Dietary pattern unlikely to meet recommendations for good health and room for improvement. 51-60 More healthful dietary pattern, with some room for improvement.  >60 Healthy dietary pattern, although there may be some specific behaviors that could be improved.    Nutrition Goals Re-Evaluation:   Nutrition Goals Re-Evaluation:   Nutrition Goals Discharge (Final Nutrition Goals Re-Evaluation):   Psychosocial: Target Goals: Acknowledge presence or absence of significant depression and/or stress, maximize coping skills, provide positive support system. Participant is able to verbalize types and ability to use techniques and skills needed for reducing stress and depression.  Initial Review & Psychosocial Screening:  Initial Psych Review & Screening - 01/07/24 1448       Initial Review   Current issues with Current Depression;Current Anxiety/Panic      Family Dynamics   Good Support System? Yes   wife   Comments Octaviano shared that he has medium levels of depression due to several recent health issues. He is eager to get better and focused on longevitiy, however questioning his longevity due to his recent health issues. He has PTSD as  well, he is familiar with triggers and has seen a counselor in the past. Is not interested in seeing one currently, stating he likes his quiet life.      Barriers   Psychosocial barriers to participate in program The patient should benefit from training in stress management and relaxation.      Screening Interventions   Interventions Encouraged to exercise;To provide support and resources with identified psychosocial needs;Provide feedback about the scores to participant    Expected Outcomes Short Term goal: Utilizing psychosocial counselor, staff and physician to assist with identification of specific Stressors or current issues interfering with healing process. Setting desired goal for each stressor or current issue identified.;Long Term Goal: Stressors or current issues are controlled or eliminated.;Short Term goal: Identification and review with participant of any Quality of Life or Depression concerns  found by scoring the questionnaire.;Long Term goal: The participant improves quality of Life and PHQ9 Scores as seen by post scores and/or verbalization of changes          Quality of Life Scores:  Quality of Life - 01/07/24 1455       Quality of Life   Select Quality of Life      Quality of Life Scores   Health/Function Pre 28.73 %    Socioeconomic Pre 30 %    Psych/Spiritual Pre 27.57 %    Family Pre 30 %    GLOBAL Pre 28.94 %         Scores of 19 and below usually indicate a poorer quality of life in these areas.  A difference of  2-3 points is a clinically meaningful difference.  A difference of 2-3 points in the total score of the Quality of Life Index has been associated with significant improvement in overall quality of life, self-image, physical symptoms, and general health in studies assessing change in quality of life.  PHQ-9: Review Flowsheet       01/07/2024  Depression screen PHQ 2/9  Decreased Interest 1  Down, Depressed, Hopeless 1  PHQ - 2 Score 2  Altered  sleeping 0  Tired, decreased energy 0  Change in appetite 0  Feeling bad or failure about yourself  1  Trouble concentrating 0  Moving slowly or fidgety/restless 0  Suicidal thoughts 0  PHQ-9 Score 3  Difficult doing work/chores Somewhat difficult   Interpretation of Total Score  Total Score Depression Severity:  1-4 = Minimal depression, 5-9 = Mild depression, 10-14 = Moderate depression, 15-19 = Moderately severe depression, 20-27 = Severe depression   Psychosocial Evaluation and Intervention:   Psychosocial Re-Evaluation:   Psychosocial Discharge (Final Psychosocial Re-Evaluation):   Vocational Rehabilitation: Provide vocational rehab assistance to qualifying candidates.   Vocational Rehab Evaluation & Intervention:  Vocational Rehab - 01/07/24 1457       Initial Vocational Rehab Evaluation & Intervention   Assessment shows need for Vocational Rehabilitation No   retired         Education: Education Goals: Education classes will be provided on a weekly basis, covering required topics. Participant will state understanding/return demonstration of topics presented.     Core Videos: Exercise    Move It!  Clinical staff conducted group or individual video education with verbal and written material and guidebook.  Patient learns the recommended Pritikin exercise program. Exercise with the goal of living a long, healthy life. Some of the health benefits of exercise include controlled diabetes, healthier blood pressure levels, improved cholesterol levels, improved heart and lung capacity, improved sleep, and better body composition. Everyone should speak with their doctor before starting or changing an exercise routine.  Biomechanical Limitations Clinical staff conducted group or individual video education with verbal and written material and guidebook.  Patient learns how biomechanical limitations can impact exercise and how we can mitigate and possibly overcome  limitations to have an impactful and balanced exercise routine.  Body Composition Clinical staff conducted group or individual video education with verbal and written material and guidebook.  Patient learns that body composition (ratio of muscle mass to fat mass) is a key component to assessing overall fitness, rather than body weight alone. Increased fat mass, especially visceral belly fat, can put us  at increased risk for metabolic syndrome, type 2 diabetes, heart disease, and even death. It is recommended to combine diet and exercise (cardiovascular and resistance training) to  improve your body composition. Seek guidance from your physician and exercise physiologist before implementing an exercise routine.  Exercise Action Plan Clinical staff conducted group or individual video education with verbal and written material and guidebook.  Patient learns the recommended strategies to achieve and enjoy long-term exercise adherence, including variety, self-motivation, self-efficacy, and positive decision making. Benefits of exercise include fitness, good health, weight management, more energy, better sleep, less stress, and overall well-being.  Medical   Heart Disease Risk Reduction Clinical staff conducted group or individual video education with verbal and written material and guidebook.  Patient learns our heart is our most vital organ as it circulates oxygen, nutrients, white blood cells, and hormones throughout the entire body, and carries waste away. Data supports a plant-based eating plan like the Pritikin Program for its effectiveness in slowing progression of and reversing heart disease. The video provides a number of recommendations to address heart disease.   Metabolic Syndrome and Belly Fat  Clinical staff conducted group or individual video education with verbal and written material and guidebook.  Patient learns what metabolic syndrome is, how it leads to heart disease, and how one can  reverse it and keep it from coming back. You have metabolic syndrome if you have 3 of the following 5 criteria: abdominal obesity, high blood pressure, high triglycerides, low HDL cholesterol, and high blood sugar.  Hypertension and Heart Disease Clinical staff conducted group or individual video education with verbal and written material and guidebook.  Patient learns that high blood pressure, or hypertension, is very common in the [place] . Hypertension is largely due to excessive salt intake, but other important risk factors include being overweight, physical inactivity, drinking too much alcohol , smoking, and not eating enough potassium from fruits and vegetables. High blood pressure is a leading risk factor for heart attack, stroke, congestive heart failure, dementia, kidney failure, and premature death. Long-term effects of excessive salt intake include stiffening of the arteries and thickening of heart muscle and organ damage. Recommendations include ways to reduce hypertension and the risk of heart disease.  Diseases of Our Time - Focusing on Diabetes Clinical staff conducted group or individual video education with verbal and written material and guidebook.  Patient learns why the best way to stop diseases of our time is prevention, through food and other lifestyle changes. Medicine (such as prescription pills and surgeries) is often only a Band-Aid on the problem, not a long-term solution. Most common diseases of our time include obesity, type 2 diabetes, hypertension, heart disease, and cancer. The Pritikin Program is recommended and has been proven to help reduce, reverse, and/or prevent the damaging effects of metabolic syndrome.  Nutrition   Overview of the Pritikin Eating Plan  Clinical staff conducted group or individual video education with verbal and written material and guidebook.  Patient learns about the Pritikin Eating Plan for disease risk reduction. The Pritikin Eating Plan  emphasizes a wide variety of unrefined, minimally-processed carbohydrates, like fruits, vegetables, whole grains, and legumes. Go, Caution, and Stop food choices are explained. Plant-based and lean animal proteins are emphasized. Rationale provided for low sodium intake for blood pressure control, low added sugars for blood sugar stabilization, and low added fats and oils for coronary artery disease risk reduction and weight management.  Calorie Density  Clinical staff conducted group or individual video education with verbal and written material and guidebook.  Patient learns about calorie density and how it impacts the Pritikin Eating Plan. Knowing the characteristics of the food you  choose will help you decide whether those foods will lead to weight gain or weight loss, and whether you want to consume more or less of them. Weight loss is usually a side effect of the Pritikin Eating Plan because of its focus on low calorie-dense foods.  Label Reading  Clinical staff conducted group or individual video education with verbal and written material and guidebook.  Patient learns about the Pritikin recommended label reading guidelines and corresponding recommendations regarding calorie density, added sugars, sodium content, and whole grains.  Dining Out - Part 1  Clinical staff conducted group or individual video education with verbal and written material and guidebook.  Patient learns that restaurant meals can be sabotaging because they can be so high in calories, fat, sodium, and/or sugar. Patient learns recommended strategies on how to positively address this and avoid unhealthy pitfalls.  Facts on Fats  Clinical staff conducted group or individual video education with verbal and written material and guidebook.  Patient learns that lifestyle modifications can be just as effective, if not more so, as many medications for lowering your risk of heart disease. A Pritikin lifestyle can help to reduce your  risk of inflammation and atherosclerosis (cholesterol build-up, or plaque, in the artery walls). Lifestyle interventions such as dietary choices and physical activity address the cause of atherosclerosis. A review of the types of fats and their impact on blood cholesterol levels, along with dietary recommendations to reduce fat intake is also included.  Nutrition Action Plan  Clinical staff conducted group or individual video education with verbal and written material and guidebook.  Patient learns how to incorporate Pritikin recommendations into their lifestyle. Recommendations include planning and keeping personal health goals in mind as an important part of their success.  Healthy Mind-Set    Healthy Minds, Bodies, Hearts  Clinical staff conducted group or individual video education with verbal and written material and guidebook.  Patient learns how to identify when they are stressed. Video will discuss the impact of that stress, as well as the many benefits of stress management. Patient will also be introduced to stress management techniques. The way we think, act, and feel has an impact on our hearts.  How Our Thoughts Can Heal Our Hearts  Clinical staff conducted group or individual video education with verbal and written material and guidebook.  Patient learns that negative thoughts can cause depression and anxiety. This can result in negative lifestyle behavior and serious health problems. Cognitive behavioral therapy is an effective method to help control our thoughts in order to change and improve our emotional outlook.  Additional Videos:  Exercise    Improving Performance  Clinical staff conducted group or individual video education with verbal and written material and guidebook.  Patient learns to use a non-linear approach by alternating intensity levels and lengths of time spent exercising to help burn more calories and lose more body fat. Cardiovascular exercise helps improve heart  health, metabolism, hormonal balance, blood sugar control, and recovery from fatigue. Resistance training improves strength, endurance, balance, coordination, reaction time, metabolism, and muscle mass. Flexibility exercise improves circulation, posture, and balance. Seek guidance from your physician and exercise physiologist before implementing an exercise routine and learn your capabilities and proper form for all exercise.  Introduction to Yoga  Clinical staff conducted group or individual video education with verbal and written material and guidebook.  Patient learns about yoga, a discipline of the coming together of mind, breath, and body. The benefits of yoga include improved flexibility, improved  range of motion, better posture and core strength, increased lung function, weight loss, and positive self-image. Yoga's heart health benefits include lowered blood pressure, healthier heart rate, decreased cholesterol and triglyceride levels, improved immune function, and reduced stress. Seek guidance from your physician and exercise physiologist before implementing an exercise routine and learn your capabilities and proper form for all exercise.  Medical   Aging: Enhancing Your Quality of Life  Clinical staff conducted group or individual video education with verbal and written material and guidebook.  Patient learns key strategies and recommendations to stay in good physical health and enhance quality of life, such as prevention strategies, having an advocate, securing a Health Care Proxy and Power of Attorney, and keeping a list of medications and system for tracking them. It also discusses how to avoid risk for bone loss.  Biology of Weight Control  Clinical staff conducted group or individual video education with verbal and written material and guidebook.  Patient learns that weight gain occurs because we consume more calories than we burn (eating more, moving less). Even if your body weight is  normal, you may have higher ratios of fat compared to muscle mass. Too much body fat puts you at increased risk for cardiovascular disease, heart attack, stroke, type 2 diabetes, and obesity-related cancers. In addition to exercise, following the Pritikin Eating Plan can help reduce your risk.  Decoding Lab Results  Clinical staff conducted group or individual video education with verbal and written material and guidebook.  Patient learns that lab test reflects one measurement whose values change over time and are influenced by many factors, including medication, stress, sleep, exercise, food, hydration, pre-existing medical conditions, and more. It is recommended to use the knowledge from this video to become more involved with your lab results and evaluate your numbers to speak with your doctor.   Diseases of Our Time - Overview  Clinical staff conducted group or individual video education with verbal and written material and guidebook.  Patient learns that according to the CDC, 50% to 70% of chronic diseases (such as obesity, type 2 diabetes, elevated lipids, hypertension, and heart disease) are avoidable through lifestyle improvements including healthier food choices, listening to satiety cues, and increased physical activity.  Sleep Disorders Clinical staff conducted group or individual video education with verbal and written material and guidebook.  Patient learns how good quality and duration of sleep are important to overall health and well-being. Patient also learns about sleep disorders and how they impact health along with recommendations to address them, including discussing with a physician.  Nutrition  Dining Out - Part 2 Clinical staff conducted group or individual video education with verbal and written material and guidebook.  Patient learns how to plan ahead and communicate in order to maximize their dining experience in a healthy and nutritious manner. Included are recommended  food choices based on the type of restaurant the patient is visiting.   Fueling a Banker conducted group or individual video education with verbal and written material and guidebook.  There is a strong connection between our food choices and our health. Diseases like obesity and type 2 diabetes are very prevalent and are in large-part due to lifestyle choices. The Pritikin Eating Plan provides plenty of food and hunger-curbing satisfaction. It is easy to follow, affordable, and helps reduce health risks.  Menu Workshop  Clinical staff conducted group or individual video education with verbal and written material and guidebook.  Patient learns that restaurant meals  can sabotage health goals because they are often packed with calories, fat, sodium, and sugar. Recommendations include strategies to plan ahead and to communicate with the manager, chef, or server to help order a healthier meal.  Planning Your Eating Strategy  Clinical staff conducted group or individual video education with verbal and written material and guidebook.  Patient learns about the Pritikin Eating Plan and its benefit of reducing the risk of disease. The Pritikin Eating Plan does not focus on calories. Instead, it emphasizes high-quality, nutrient-rich foods. By knowing the characteristics of the foods, we choose, we can determine their calorie density and make informed decisions.  Targeting Your Nutrition Priorities  Clinical staff conducted group or individual video education with verbal and written material and guidebook.  Patient learns that lifestyle habits have a tremendous impact on disease risk and progression. This video provides eating and physical activity recommendations based on your personal health goals, such as reducing LDL cholesterol, losing weight, preventing or controlling type 2 diabetes, and reducing high blood pressure.  Vitamins and Minerals  Clinical staff conducted group or  individual video education with verbal and written material and guidebook.  Patient learns different ways to obtain key vitamins and minerals, including through a recommended healthy diet. It is important to discuss all supplements you take with your doctor.   Healthy Mind-Set    Smoking Cessation  Clinical staff conducted group or individual video education with verbal and written material and guidebook.  Patient learns that cigarette smoking and tobacco addiction pose a serious health risk which affects millions of people. Stopping smoking will significantly reduce the risk of heart disease, lung disease, and many forms of cancer. Recommended strategies for quitting are covered, including working with your doctor to develop a successful plan.  Culinary   Becoming a Set designer conducted group or individual video education with verbal and written material and guidebook.  Patient learns that cooking at home can be healthy, cost-effective, quick, and puts them in control. Keys to cooking healthy recipes will include looking at your recipe, assessing your equipment needs, planning ahead, making it simple, choosing cost-effective seasonal ingredients, and limiting the use of added fats, salts, and sugars.  Cooking - Breakfast and Snacks  Clinical staff conducted group or individual video education with verbal and written material and guidebook.  Patient learns how important breakfast is to satiety and nutrition through the entire day. Recommendations include key foods to eat during breakfast to help stabilize blood sugar levels and to prevent overeating at meals later in the day. Planning ahead is also a key component.  Cooking - Educational psychologist conducted group or individual video education with verbal and written material and guidebook.  Patient learns eating strategies to improve overall health, including an approach to cook more at home. Recommendations include  thinking of animal protein as a side on your plate rather than center stage and focusing instead on lower calorie dense options like vegetables, fruits, whole grains, and plant-based proteins, such as beans. Making sauces in large quantities to freeze for later and leaving the skin on your vegetables are also recommended to maximize your experience.  Cooking - Healthy Salads and Dressing Clinical staff conducted group or individual video education with verbal and written material and guidebook.  Patient learns that vegetables, fruits, whole grains, and legumes are the foundations of the Pritikin Eating Plan. Recommendations include how to incorporate each of these in flavorful and healthy salads, and how to  create homemade salad dressings. Proper handling of ingredients is also covered. Cooking - Soups and [employer] - Soups and Desserts Clinical staff conducted group or individual video education with verbal and written material and guidebook.  Patient learns that Pritikin soups and desserts make for easy, nutritious, and delicious snacks and meal components that are low in sodium, fat, sugar, and calorie density, while high in vitamins, minerals, and filling fiber. Recommendations include simple and healthy ideas for soups and desserts.   Overview     The Pritikin Solution Program Overview Clinical staff conducted group or individual video education with verbal and written material and guidebook.  Patient learns that the results of the Pritikin Program have been documented in more than 100 articles published in peer-reviewed journals, and the benefits include reducing risk factors for (and, in some cases, even reversing) high cholesterol, high blood pressure, type 2 diabetes, obesity, and more! An overview of the three key pillars of the Pritikin Program will be covered: eating well, doing regular exercise, and having a healthy mind-set.  WORKSHOPS  Exercise: Exercise Basics: Building Your  Action Plan Clinical staff led group instruction and group discussion with PowerPoint presentation and patient guidebook. To enhance the learning environment the use of posters, models and videos may be added. At the conclusion of this workshop, patients will comprehend the difference between physical activity and exercise, as well as the benefits of incorporating both, into their routine. Patients will understand the FITT (Frequency, Intensity, Time, and Type) principle and how to use it to build an exercise action plan. In addition, safety concerns and other considerations for exercise and cardiac rehab will be addressed by the presenter. The purpose of this lesson is to promote a comprehensive and effective weekly exercise routine in order to improve patients' overall level of fitness.   Managing Heart Disease: Your Path to a Healthier Heart Clinical staff led group instruction and group discussion with PowerPoint presentation and patient guidebook. To enhance the learning environment the use of posters, models and videos may be added.At the conclusion of this workshop, patients will understand the anatomy and physiology of the heart. Additionally, they will understand how Pritikin's three pillars impact the risk factors, the progression, and the management of heart disease.  The purpose of this lesson is to provide a high-level overview of the heart, heart disease, and how the Pritikin lifestyle positively impacts risk factors.  Exercise Biomechanics Clinical staff led group instruction and group discussion with PowerPoint presentation and patient guidebook. To enhance the learning environment the use of posters, models and videos may be added. Patients will learn how the structural parts of their bodies function and how these functions impact their daily activities, movement, and exercise. Patients will learn how to promote a neutral spine, learn how to manage pain, and identify ways to  improve their physical movement in order to promote healthy living. The purpose of this lesson is to expose patients to common physical limitations that impact physical activity. Participants will learn practical ways to adapt and manage aches and pains, and to minimize their effect on regular exercise. Patients will learn how to maintain good posture while sitting, walking, and lifting.  Balance Training and Fall Prevention  Clinical staff led group instruction and group discussion with PowerPoint presentation and patient guidebook. To enhance the learning environment the use of posters, models and videos may be added. At the conclusion of this workshop, patients will understand the importance of their sensorimotor skills (vision, proprioception, and  the vestibular system) in maintaining their ability to balance as they age. Patients will apply a variety of balancing exercises that are appropriate for their current level of function. Patients will understand the common causes for poor balance, possible solutions to these problems, and ways to modify their physical environment in order to minimize their fall risk. The purpose of this lesson is to teach patients about the importance of maintaining balance as they age and ways to minimize their risk of falling.  WORKSHOPS   Nutrition:  Fueling a Ship broker led group instruction and group discussion with PowerPoint presentation and patient guidebook. To enhance the learning environment the use of posters, models and videos may be added. Patients will review the foundational principles of the Pritikin Eating Plan and understand what constitutes a serving size in each of the food groups. Patients will also learn Pritikin-friendly foods that are better choices when away from home and review make-ahead meal and snack options. Calorie density will be reviewed and applied to three nutrition priorities: weight maintenance, weight loss, and  weight gain. The purpose of this lesson is to reinforce (in a group setting) the key concepts around what patients are recommended to eat and how to apply these guidelines when away from home by planning and selecting Pritikin-friendly options. Patients will understand how calorie density may be adjusted for different weight management goals.  Mindful Eating  Clinical staff led group instruction and group discussion with PowerPoint presentation and patient guidebook. To enhance the learning environment the use of posters, models and videos may be added. Patients will briefly review the concepts of the Pritikin Eating Plan and the importance of low-calorie dense foods. The concept of mindful eating will be introduced as well as the importance of paying attention to internal hunger signals. Triggers for non-hunger eating and techniques for dealing with triggers will be explored. The purpose of this lesson is to provide patients with the opportunity to review the basic principles of the Pritikin Eating Plan, discuss the value of eating mindfully and how to measure internal cues of hunger and fullness using the Hunger Scale. Patients will also discuss reasons for non-hunger eating and learn strategies to use for controlling emotional eating.  Targeting Your Nutrition Priorities Clinical staff led group instruction and group discussion with PowerPoint presentation and patient guidebook. To enhance the learning environment the use of posters, models and videos may be added. Patients will learn how to determine their genetic susceptibility to disease by reviewing their family history. Patients will gain insight into the importance of diet as part of an overall healthy lifestyle in mitigating the impact of genetics and other environmental insults. The purpose of this lesson is to provide patients with the opportunity to assess their personal nutrition priorities by looking at their family history, their own health  history and current risk factors. Patients will also be able to discuss ways of prioritizing and modifying the Pritikin Eating Plan for their highest risk areas  Menu  Clinical staff led group instruction and group discussion with PowerPoint presentation and patient guidebook. To enhance the learning environment the use of posters, models and videos may be added. Using menus brought in from [employer], or printed from [employer], patients will apply the Pritikin dining out guidelines that were presented in the [employer] video. Patients will also be able to practice these guidelines in a variety of provided scenarios. The purpose of this lesson is to provide patients with the opportunity  to practice hands-on learning of the [employer] guidelines with actual menus and practice scenarios.  Label Reading Clinical staff led group instruction and group discussion with PowerPoint presentation and patient guidebook. To enhance the learning environment the use of posters, models and videos may be added. Patients will review and discuss the Pritikin label reading guidelines presented in Pritikin's Label Reading Educational series video. Using fool labels brought in from local grocery stores and markets, patients will apply the label reading guidelines and determine if the packaged food meet the Pritikin guidelines. The purpose of this lesson is to provide patients with the opportunity to review, discuss, and practice hands-on learning of the Pritikin Label Reading guidelines with actual packaged food labels. Cooking School  Pritikin's [employer] are designed to teach patients ways to prepare quick, simple, and affordable recipes at home. The importance of nutrition's role in chronic disease risk reduction is reflected in its emphasis in the overall Pritikin program. By learning how to prepare essential core Pritikin Eating Plan recipes, patients will increase  control over what they eat; be able to customize the flavor of foods without the use of added salt, sugar, or fat; and improve the quality of the food they consume. By learning a set of core recipes which are easily assembled, quickly prepared, and affordable, patients are more likely to prepare more healthy foods at home. These workshops focus on convenient breakfasts, simple entres, side dishes, and desserts which can be prepared with minimal effort and are consistent with nutrition recommendations for cardiovascular risk reduction. Cooking [employer] are taught by a Armed forces logistics/support/administrative officer (RD) who has been trained by the [employer]. The chef or RD has a clear understanding of the importance of minimizing - if not completely eliminating - added fat, sugar, and sodium in recipes. Throughout the series of Cooking School Workshop sessions, patients will learn about healthy ingredients and efficient methods of cooking to build confidence in their capability to prepare    Cooking School weekly topics:  Adding Flavor- Sodium-Free  Fast and Healthy Breakfasts  Powerhouse Plant-Based Proteins  Satisfying Salads and Dressings  Simple Sides and Sauces  International Cuisine-Spotlight on the [employer] Zones  Delicious Desserts  Savory Soups  [employer] - Meals in a Astronomer Appetizers and Snacks  Comforting Weekend Breakfasts  One-Pot Wonders   Fast Evening Meals  Landscape architect Your Pritikin Plate  WORKSHOPS   Healthy Mindset (Psychosocial):  Focused Goals, Sustainable Changes Clinical staff led group instruction and group discussion with PowerPoint presentation and patient guidebook. To enhance the learning environment the use of posters, models and videos may be added. Patients will be able to apply effective goal setting strategies to establish at least one personal goal, and then take consistent, meaningful action toward that goal. They will  learn to identify common barriers to achieving personal goals and develop strategies to overcome them. Patients will also gain an understanding of how our mind-set can impact our ability to achieve goals and the importance of cultivating a positive and growth-oriented mind-set. The purpose of this lesson is to provide patients with a deeper understanding of how to set and achieve personal goals, as well as the tools and strategies needed to overcome common obstacles which may arise along the way.  From Head to Heart: The Power of a Healthy Outlook  Clinical staff led group instruction and group discussion with PowerPoint presentation and patient guidebook. To enhance the learning environment  the use of posters, models and videos may be added. Patients will be able to recognize and describe the impact of emotions and mood on physical health. They will discover the importance of self-care and explore self-care practices which may work for them. Patients will also learn how to utilize the 4 C's to cultivate a healthier outlook and better manage stress and challenges. The purpose of this lesson is to demonstrate to patients how a healthy outlook is an essential part of maintaining good health, especially as they continue their cardiac rehab journey.  Healthy Sleep for a Healthy Heart Clinical staff led group instruction and group discussion with PowerPoint presentation and patient guidebook. To enhance the learning environment the use of posters, models and videos may be added. At the conclusion of this workshop, patients will be able to demonstrate knowledge of the importance of sleep to overall health, well-being, and quality of life. They will understand the symptoms of, and treatments for, common sleep disorders. Patients will also be able to identify daytime and nighttime behaviors which impact sleep, and they will be able to apply these tools to help manage sleep-related challenges. The purpose of this  lesson is to provide patients with a general overview of sleep and outline the importance of quality sleep. Patients will learn about a few of the most common sleep disorders. Patients will also be introduced to the concept of "sleep hygiene," and discover ways to self-manage certain sleeping problems through simple daily behavior changes. Finally, the workshop will motivate patients by clarifying the links between quality sleep and their goals of heart-healthy living.   Recognizing and Reducing Stress Clinical staff led group instruction and group discussion with PowerPoint presentation and patient guidebook. To enhance the learning environment the use of posters, models and videos may be added. At the conclusion of this workshop, patients will be able to understand the types of stress reactions, differentiate between acute and chronic stress, and recognize the impact that chronic stress has on their health. They will also be able to apply different coping mechanisms, such as reframing negative self-talk. Patients will have the opportunity to practice a variety of stress management techniques, such as deep abdominal breathing, progressive muscle relaxation, and/or guided imagery.  The purpose of this lesson is to educate patients on the role of stress in their lives and to provide healthy techniques for coping with it.  Learning Barriers/Preferences:  Learning Barriers/Preferences - 01/07/24 1456       Learning Barriers/Preferences   Learning Barriers Sight;Hearing   glasses/hearing aids   Learning Preferences Computer/Internet;Group Instruction;Individual Instruction;Skilled Demonstration;Verbal Instruction;Video;Written Material;Pictoral          Education Topics:  Knowledge Questionnaire Score:  Knowledge Questionnaire Score - 01/07/24 1456       Knowledge Questionnaire Score   Pre Score 21/24          Core Components/Risk Factors/Patient Goals at Admission:  Personal Goals and  Risk Factors at Admission - 01/07/24 1457       Core Components/Risk Factors/Patient Goals on Admission    Weight Management Yes    Intervention Weight Management: Develop a combined nutrition and exercise program designed to reach desired caloric intake, while maintaining appropriate intake of nutrient and fiber, sodium and fats, and appropriate energy expenditure required for the weight goal.;Weight Management: Provide education and appropriate resources to help participant work on and attain dietary goals.    Expected Outcomes Short Term: Continue to assess and modify interventions until short term weight is achieved;Long Term:  Adherence to nutrition and physical activity/exercise program aimed toward attainment of established weight goal;Understanding recommendations for meals to include 15-35% energy as protein, 25-35% energy from fat, 35-60% energy from carbohydrates, less than 200mg  of dietary cholesterol, 20-35 gm of total fiber daily;Understanding of distribution of calorie intake throughout the day with the consumption of 4-5 meals/snacks    Diabetes Yes    Intervention Provide education about proper nutrition, including hydration, and aerobic/resistive exercise prescription along with prescribed medications to achieve blood glucose in normal ranges: Fasting glucose 65-99 mg/dL;Provide education about signs/symptoms and action to take for hypo/hyperglycemia.    Expected Outcomes Long Term: Attainment of HbA1C < 7%.;Short Term: Participant verbalizes understanding of the signs/symptoms and immediate care of hyper/hypoglycemia, proper foot care and importance of medication, aerobic/resistive exercise and nutrition plan for blood glucose control.    Hypertension Yes    Intervention Monitor prescription use compliance.;Provide education on lifestyle modifcations including regular physical activity/exercise, weight management, moderate sodium restriction and increased consumption of fresh fruit,  vegetables, and low fat dairy, alcohol  moderation, and smoking cessation.    Expected Outcomes Long Term: Maintenance of blood pressure at goal levels.;Short Term: Continued assessment and intervention until BP is < 140/15mm HG in hypertensive participants. < 130/66mm HG in hypertensive participants with diabetes, heart failure or chronic kidney disease.    Lipids Yes    Intervention Provide education and support for participant on nutrition & aerobic/resistive exercise along with prescribed medications to achieve LDL 70mg , HDL >40mg .    Expected Outcomes Long Term: Cholesterol controlled with medications as prescribed, with individualized exercise RX and with personalized nutrition plan. Value goals: LDL < 70mg , HDL > 40 mg.;Short Term: Participant states understanding of desired cholesterol values and is compliant with medications prescribed. Participant is following exercise prescription and nutrition guidelines.    Stress Yes    Intervention Refer participants experiencing significant psychosocial distress to appropriate mental health specialists for further evaluation and treatment. When possible, include family members and significant others in education/counseling sessions.;Offer individual and/or small group education and counseling on adjustment to heart disease, stress management and health-related lifestyle change. Teach and support self-help strategies.    Expected Outcomes Long Term: Emotional wellbeing is indicated by absence of clinically significant psychosocial distress or social isolation.;Short Term: Participant demonstrates changes in health-related behavior, relaxation and other stress management skills, ability to obtain effective social support, and compliance with psychotropic medications if prescribed.          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 Introduction to [facility] education/intensive cardiac rehab. Initial orientation packet reviewed with patient.          Comments: Participant attended orientation for the cardiac rehabilitation program on  01/07/2024  to perform initial intake and exercise walk test. Patient introduced to the [facility] education and orientation packet was reviewed. Completed 6-minute walk test, measurements, initial ITP, and exercise prescription. Vital signs stable. Telemetry-normal sinus rhythm PVCs, asymptomatic.   Service time was from 1044 to 1215.  Con KATHEE Pereyra, MS, ACSM-CEP 01/07/2024 2:58 PM

## 2024-01-13 ENCOUNTER — Encounter (HOSPITAL_COMMUNITY)
Admission: RE | Admit: 2024-01-13 | Discharge: 2024-01-13 | Disposition: A | Discharge status: Home / Self Care | Source: Ambulatory Visit | Attending: Cardiovascular Disease | Admitting: Cardiovascular Disease

## 2024-01-13 ENCOUNTER — Telehealth (HOSPITAL_COMMUNITY): Payer: Self-pay | Admitting: *Deleted

## 2024-01-13 DIAGNOSIS — Z955 Presence of coronary angioplasty implant and graft: Secondary | ICD-10-CM | POA: Insufficient documentation

## 2024-01-13 DIAGNOSIS — I2111 ST elevation (STEMI) myocardial infarction involving right coronary artery: Secondary | ICD-10-CM | POA: Insufficient documentation

## 2024-01-13 NOTE — Progress Notes (Signed)
 Incomplete Session Note  Patient Details  Name: Randall Frazier MRN: [id number] Date of Birth: 04/28/1956 Referring Provider:   Flowsheet Row INTENSIVE CARDIAC REHAB ORIENT from 01/07/2024 in [facility]  Referring Provider Ozell Fell, MD    Beryl LELON Fire did not complete his rehab session.  Patient was in the hospital over the weekend at the [facility]. Patient's wife said that Mr Falzone was discharged on 01/10/24. I called [facility] and spoke with a [place]. The patient will be set up for an office visit and clearance to start exercise at cardiac rehab. Patient and wife state understanding.Hadassah Elpidio Quan RN BSN

## 2024-01-13 NOTE — Telephone Encounter (Signed)
 Spoke with the patient. Will cancel appointments for the rest of the week. Patient states understanding.Hadassah Elpidio Quan RN BSN

## 2024-01-14 ENCOUNTER — Telehealth (HOSPITAL_COMMUNITY): Payer: Self-pay

## 2024-01-14 ENCOUNTER — Telehealth (HOSPITAL_COMMUNITY): Payer: Self-pay | Admitting: *Deleted

## 2024-01-14 NOTE — Telephone Encounter (Signed)
 Left message to call cardiac rehab.Hadassah Elpidio Quan RN BSN

## 2024-01-14 NOTE — Telephone Encounter (Signed)
 Clearance for cardiac rehab letter received from Dr. Marcey Hoe from [facility].

## 2024-01-15 ENCOUNTER — Encounter (HOSPITAL_COMMUNITY)

## 2024-01-15 ENCOUNTER — Encounter (HOSPITAL_COMMUNITY)
Admission: RE | Admit: 2024-01-15 | Source: Ambulatory Visit | Attending: Cardiovascular Disease | Admitting: Cardiovascular Disease

## 2024-01-18 ENCOUNTER — Encounter (HOSPITAL_COMMUNITY): Admission: RE | Admit: 2024-01-18 | Source: Ambulatory Visit

## 2024-01-19 NOTE — Progress Notes (Signed)
 Cardiac Individual Treatment Plan  Patient Details  Name: Randall Frazier MRN: [id number] Date of Birth: 01-17-1956 Referring Provider:   Flowsheet Row INTENSIVE CARDIAC REHAB ORIENT from 01/07/2024 in [facility]  Referring Provider Randall Fell, MD    Initial Encounter Date:  Flowsheet Row INTENSIVE CARDIAC REHAB ORIENT from 01/07/2024 in [facility]  Date 01/07/24    Visit Diagnosis: 11/07/23 STEMI, DES RCA  11/07/23 DES RCA  Patient's Home Medications on Admission:  Current Outpatient Medications:    Alirocumab  (PRALUENT ) 150 MG/ML SOAJ, Inject 1 mL (150 mg total) into the skin every 14 (fourteen) days., Disp: 2 mL, Rfl: 11   Artificial Tears ophthalmic solution, Place 1 drop into both eyes as needed for dry eyes., Disp: , Rfl:    aspirin  81 MG chewable tablet, Chew 81 mg by mouth daily., Disp: , Rfl:    coal tar (NEUTROGENA T-GEL) 0.5 % shampoo, Apply 1 Application topically at bedtime as needed (psorasis)., Disp: , Rfl:    doxazosin (CARDURA) 1 MG tablet, Take 1 mg by mouth daily., Disp: , Rfl:    ezetimibe  (ZETIA ) 10 MG tablet, Take 1 tablet (10 mg total) by mouth daily. (Patient taking differently: Take 1 tablet (10 mg total) by mouth daily.), Disp: 90 tablet, Rfl: 3   hydrocortisone  (CORTEF ) 10 MG tablet, Take 5-10 mg by mouth See admin instructions. Take 1 tablet (10mg ) by mouth in the morning and take 1/2 tablet (5mg ) at bedtime., Disp: , Rfl:    Isavuconazonium Sulfate  (CRESEMBA ) 186 MG CAPS, Take 372 mg by mouth daily., Disp: , Rfl:    lidocaine  4 %, Place 1 patch onto the skin daily as needed (pain)., Disp: , Rfl:    metoprolol  succinate (TOPROL -XL) 25 MG 24 hr tablet, Take 1 tablet (25 mg total) by mouth daily., Disp: 180 tablet, Rfl: 3   mexiletine (MEXITIL ) 250 MG capsule, Take 1 capsule (250 mg total) by mouth every 12 (twelve) hours. (Patient not taking:  Reported on 01/07/2024), Disp: 60 capsule, Rfl: 0   mycophenolate  (CELLCEPT ) 250 MG capsule, Take 1 capsule (250 mg total) by mouth 2 (two) times daily., Disp: , Rfl:    nitroGLYCERIN  (NITROSTAT ) 0.4 MG SL tablet, Place 1 tablet (0.4 mg total) under the tongue every 5 (five) minutes x 3 doses as needed for chest pain., Disp: 25 tablet, Rfl: 0   omega-3 acid ethyl esters (LOVAZA ) 1 g capsule, Take 2 capsules (2 g total) by mouth 2 (two) times daily. (Patient not taking: Reported on 01/07/2024), Disp: 360 capsule, Rfl: 3   omeprazole (PRILOSEC) 40 MG capsule, Take 40 mg by mouth daily., Disp: , Rfl:    polyethylene glycol powder (GLYCOLAX /MIRALAX ) 17 GM/SCOOP powder, Take 17 g by mouth daily as needed for severe constipation., Disp: , Rfl:    potassium chloride  SA (KLOR-CON  M) 20 MEQ tablet, Take 2 tablets (40 mEq total) by mouth 2 (two) times daily. (Patient not taking: Reported on 01/07/2024), Disp: 6 tablet, Rfl: 0   sertraline  (ZOLOFT ) 100 MG tablet, Take 100 mg by mouth daily., Disp: , Rfl:    sildenafil (VIAGRA) 100 MG tablet, Take 100 mg by mouth daily as needed for erectile dysfunction., Disp: , Rfl:    silver sulfADIAZINE (SILVADENE) 1 % cream, Apply 1 Application topically daily., Disp: , Rfl:    sirolimus  (RAPAMUNE ) 1 MG tablet, Take 1 mg by mouth daily. (Patient not taking:  Reported on 01/07/2024), Disp: , Rfl:    ticagrelor  (BRILINTA ) 90 MG TABS tablet, Take 1 tablet (90 mg total) by mouth 2 (two) times daily., Disp: 180 tablet, Rfl: 3   triamcinolone cream (KENALOG) 0.1 %, Apply 1 Application topically as needed (rash arms)., Disp: , Rfl:   Past Medical History: Past Medical History:  Diagnosis Date   Arthritis    shoulder- both, neck, spine, GOUT   Diabetes mellitus    Gout    H/O exercise stress test    many yrs. 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 weight loss -over 18 mon. period     Tobacco Use: Social History   Tobacco Use  Smoking Status Never  Smokeless Tobacco Never    Labs: Review Flowsheet  More data exists      Latest Ref Rng & Units 12/15/2019 02/15/2021 03/23/2021 11/07/2023 11/08/2023  Labs for ITP Cardiac and Pulmonary Rehab  Cholestrol 0 - 200 mg/dL 876  - - 715  739   LDL (calc) 0 - 99 mg/dL 84  - - UNABLE TO CALCULATE IF TRIGLYCERIDE OVER 400 mg/dL  UNABLE TO CALCULATE IF TRIGLYCERIDE OVER 400 mg/dL   Direct LDL 0 - 99 mg/dL - - - 839  844   HDL-C >40 mg/dL 16  - - 28  24   Trlycerides <150 mg/dL 884  - - 291  477   Hemoglobin A1c 4.8 - 5.6 % 6.0  - - - 7.9   TCO2 22 - 32 mmol/L - 23  19  20   -    Capillary Blood Glucose: Lab Results  Component Value Date   GLUCAP 146 (H) 12/26/2023   GLUCAP 122 (H) 12/26/2023   GLUCAP 177 (H) 12/25/2023   GLUCAP 141 (H) 12/25/2023   GLUCAP 132 (H) 12/25/2023     Exercise Target Goals: Exercise Program Goal: Individual exercise prescription set using results from initial 6 min walk test and THRR while considering  patient's activity barriers and safety.   Exercise Prescription Goal: Initial exercise prescription builds to 30-45 minutes a day of aerobic activity, 2-3 days per week.  Home exercise guidelines will be given to patient during program as part of exercise prescription that the participant will acknowledge.  Activity Barriers & Risk Stratification:   6 Minute Walk:  6 Minute Walk     Row Name 01/07/24 1436         6 Minute Walk   Phase Initial     Distance 1410 feet     Walk Time 6 minutes     # of Rest Breaks 0     MPH 2.67     METS 3.17     RPE 12     Perceived Dyspnea  0     VO2 Peak 11.1     Symptoms Yes (comment)     Comments a little winded, no other s/sx     Resting HR 64 bpm     Resting BP 130/70     Resting Oxygen Saturation  100 %     Exercise Oxygen Saturation  during 6 min walk 100 %     Max Ex. HR  92 bpm     Max Ex. BP 142/70  2 Minute Post BP 128/74        Oxygen Initial Assessment:   Oxygen Re-Evaluation:   Oxygen Discharge (Final Oxygen Re-Evaluation):   Initial Exercise Prescription:  Initial Exercise Prescription - 01/07/24 1400       Date of Initial Exercise RX and Referring Provider   Date 01/07/24    Referring Provider Randall Fell, MD    Expected Discharge Date 04/02/23      NuStep   Level 1    SPM 70    Minutes 15    METs 2      Track   Laps 15    Minutes 15    METs 2.4      Prescription Details   Frequency (times per week) 3    Duration Progress to 30 minutes of continuous aerobic without signs/symptoms of physical distress      Intensity   THRR 40-80% of Max Heartrate 61-122    Ratings of Perceived Exertion 11-13    Perceived Dyspnea 0-4      Progression   Progression Continue progressive overload as per policy without signs/symptoms or physical distress.      Resistance Training   Training Prescription Yes    Weight 2    Reps 10-15          Perform Capillary Blood Glucose checks as needed.  Exercise Prescription Changes:   Exercise Comments:   Exercise Goals and Review:   Exercise Goals     Row Name 01/07/24 1447             Exercise Goals   Increase Physical Activity Yes       Intervention Provide advice, education, support and counseling about physical activity/exercise needs.;Develop an individualized exercise prescription for aerobic and resistive training based on initial evaluation findings, risk stratification, comorbidities and participant's personal goals.       Expected Outcomes Short Term: Attend rehab on a regular basis to increase amount of physical activity.;Long Term: Exercising regularly at least 3-5 days a week.;Long Term: Add in home exercise to make exercise part of routine and to increase amount of physical activity.       Increase Strength and Stamina Yes       Intervention Provide advice,  education, support and counseling about physical activity/exercise needs.;Develop an individualized exercise prescription for aerobic and resistive training based on initial evaluation findings, risk stratification, comorbidities and participant's personal goals.       Expected Outcomes Short Term: Increase workloads from initial exercise prescription for resistance, speed, and METs.;Short Term: Perform resistance training exercises routinely during rehab and add in resistance training at home;Long Term: Improve cardiorespiratory fitness, muscular endurance and strength as measured by increased METs and functional capacity ( )       Able to understand and use rate of perceived exertion (RPE) scale Yes       Intervention Provide education and explanation on how to use RPE scale       Expected Outcomes Short Term: Able to use RPE daily in rehab to express subjective intensity level;Long Term:  Able to use RPE to guide intensity level when exercising independently       Knowledge and understanding of Target Heart Rate Range (THRR) Yes       Intervention Provide education and explanation of THRR including how the numbers were predicted and where they are located for reference       Expected Outcomes Long Term: Able to use THRR to govern intensity when exercising independently;Short  Term: Able to state/look up THRR;Short Term: Able to use daily as guideline for intensity in rehab       Understanding of Exercise Prescription Yes       Intervention Provide education, explanation, and written materials on patient's individual exercise prescription       Expected Outcomes Short Term: Able to explain program exercise prescription;Long Term: Able to explain home exercise prescription to exercise independently          Exercise Goals Re-Evaluation :   Discharge Exercise Prescription (Final Exercise Prescription Changes):   Nutrition:  Target Goals: Understanding of nutrition guidelines, daily intake of  sodium 1500mg , cholesterol 200mg , calories 30% from fat and 7% or less from saturated fats, daily to have 5 or more servings of fruits and vegetables.  Biometrics:  Pre Biometrics - 01/07/24 1416       Pre Biometrics   Waist Circumference 40 inches    Hip Circumference 40.5 inches    Waist to Hip Ratio 0.99 %    Triceps Skinfold 26 mm    % Body Fat 29.7 %    Grip Strength 24 kg    Flexibility 0 in   not done   Single Leg Stand 3.5 seconds           Nutrition Therapy Plan and Nutrition Goals:   Nutrition Assessments:  MEDIFICTS Score Key: >=70 Need to make dietary changes  40-70 Heart Healthy Diet <= 40 Therapeutic Level Cholesterol Diet    Picture Your Plate Scores: <59 Unhealthy dietary pattern with much room for improvement. 41-50 Dietary pattern unlikely to meet recommendations for good health and room for improvement. 51-60 More healthful dietary pattern, with some room for improvement.  >60 Healthy dietary pattern, although there may be some specific behaviors that could be improved.    Nutrition Goals Re-Evaluation:   Nutrition Goals Re-Evaluation:   Nutrition Goals Discharge (Final Nutrition Goals Re-Evaluation):   Psychosocial: Target Goals: Acknowledge presence or absence of significant depression and/or stress, maximize coping skills, provide positive support system. Participant is able to verbalize types and ability to use techniques and skills needed for reducing stress and depression.  Initial Review & Psychosocial Screening:  Initial Psych Review & Screening - 01/07/24 1448       Initial Review   Current issues with Current Depression;Current Anxiety/Panic      Family Dynamics   Good Support System? Yes   wife   Comments Octaviano shared that he has medium levels of depression due to several recent health issues. He is eager to get better and focused on longevitiy, however questioning his longevity due to his recent health issues. He has PTSD as  well, he is familiar with triggers and has seen a counselor in the past. Is not interested in seeing one currently, stating he likes his quiet life.      Barriers   Psychosocial barriers to participate in program The patient should benefit from training in stress management and relaxation.      Screening Interventions   Interventions Encouraged to exercise;To provide support and resources with identified psychosocial needs;Provide feedback about the scores to participant    Expected Outcomes Short Term goal: Utilizing psychosocial counselor, staff and physician to assist with identification of specific Stressors or current issues interfering with healing process. Setting desired goal for each stressor or current issue identified.;Long Term Goal: Stressors or current issues are controlled or eliminated.;Short Term goal: Identification and review with participant of any Quality of Life or Depression concerns  found by scoring the questionnaire.;Long Term goal: The participant improves quality of Life and PHQ9 Scores as seen by post scores and/or verbalization of changes          Quality of Life Scores:  Quality of Life - 01/07/24 1455       Quality of Life   Select Quality of Life      Quality of Life Scores   Health/Function Pre 28.73 %    Socioeconomic Pre 30 %    Psych/Spiritual Pre 27.57 %    Family Pre 30 %    GLOBAL Pre 28.94 %         Scores of 19 and below usually indicate a poorer quality of life in these areas.  A difference of  2-3 points is a clinically meaningful difference.  A difference of 2-3 points in the total score of the Quality of Life Index has been associated with significant improvement in overall quality of life, self-image, physical symptoms, and general health in studies assessing change in quality of life.  PHQ-9: Review Flowsheet       01/07/2024  Depression screen PHQ 2/9  Decreased Interest 1  Down, Depressed, Hopeless 1  PHQ - 2 Score 2  Altered  sleeping 0  Tired, decreased energy 0  Change in appetite 0  Feeling bad or failure about yourself  1  Trouble concentrating 0  Moving slowly or fidgety/restless 0  Suicidal thoughts 0  PHQ-9 Score 3  Difficult doing work/chores Somewhat difficult   Interpretation of Total Score  Total Score Depression Severity:  1-4 = Minimal depression, 5-9 = Mild depression, 10-14 = Moderate depression, 15-19 = Moderately severe depression, 20-27 = Severe depression   Psychosocial Evaluation and Intervention:   Psychosocial Re-Evaluation:   Psychosocial Discharge (Final Psychosocial Re-Evaluation):   Vocational Rehabilitation: Provide vocational rehab assistance to qualifying candidates.   Vocational Rehab Evaluation & Intervention:  Vocational Rehab - 01/07/24 1457       Initial Vocational Rehab Evaluation & Intervention   Assessment shows need for Vocational Rehabilitation No   retired         Education: Education Goals: Education classes will be provided on a weekly basis, covering required topics. Participant will state understanding/return demonstration of topics presented.     Core Videos: Exercise    Move It!  Clinical staff conducted group or individual video education with verbal and written material and guidebook.  Patient learns the recommended Pritikin exercise program. Exercise with the goal of living a long, healthy life. Some of the health benefits of exercise include controlled diabetes, healthier blood pressure levels, improved cholesterol levels, improved heart and lung capacity, improved sleep, and better body composition. Everyone should speak with their doctor before starting or changing an exercise routine.  Biomechanical Limitations Clinical staff conducted group or individual video education with verbal and written material and guidebook.  Patient learns how biomechanical limitations can impact exercise and how we can mitigate and possibly overcome  limitations to have an impactful and balanced exercise routine.  Body Composition Clinical staff conducted group or individual video education with verbal and written material and guidebook.  Patient learns that body composition (ratio of muscle mass to fat mass) is a key component to assessing overall fitness, rather than body weight alone. Increased fat mass, especially visceral belly fat, can put us  at increased risk for metabolic syndrome, type 2 diabetes, heart disease, and even death. It is recommended to combine diet and exercise (cardiovascular and resistance training) to  improve your body composition. Seek guidance from your physician and exercise physiologist before implementing an exercise routine.  Exercise Action Plan Clinical staff conducted group or individual video education with verbal and written material and guidebook.  Patient learns the recommended strategies to achieve and enjoy long-term exercise adherence, including variety, self-motivation, self-efficacy, and positive decision making. Benefits of exercise include fitness, good health, weight management, more energy, better sleep, less stress, and overall well-being.  Medical   Heart Disease Risk Reduction Clinical staff conducted group or individual video education with verbal and written material and guidebook.  Patient learns our heart is our most vital organ as it circulates oxygen, nutrients, white blood cells, and hormones throughout the entire body, and carries waste away. Data supports a plant-based eating plan like the Pritikin Program for its effectiveness in slowing progression of and reversing heart disease. The video provides a number of recommendations to address heart disease.   Metabolic Syndrome and Belly Fat  Clinical staff conducted group or individual video education with verbal and written material and guidebook.  Patient learns what metabolic syndrome is, how it leads to heart disease, and how one can  reverse it and keep it from coming back. You have metabolic syndrome if you have 3 of the following 5 criteria: abdominal obesity, high blood pressure, high triglycerides, low HDL cholesterol, and high blood sugar.  Hypertension and Heart Disease Clinical staff conducted group or individual video education with verbal and written material and guidebook.  Patient learns that high blood pressure, or hypertension, is very common in the [place] . Hypertension is largely due to excessive salt intake, but other important risk factors include being overweight, physical inactivity, drinking too much alcohol , smoking, and not eating enough potassium from fruits and vegetables. High blood pressure is a leading risk factor for heart attack, stroke, congestive heart failure, dementia, kidney failure, and premature death. Long-term effects of excessive salt intake include stiffening of the arteries and thickening of heart muscle and organ damage. Recommendations include ways to reduce hypertension and the risk of heart disease.  Diseases of Our Time - Focusing on Diabetes Clinical staff conducted group or individual video education with verbal and written material and guidebook.  Patient learns why the best way to stop diseases of our time is prevention, through food and other lifestyle changes. Medicine (such as prescription pills and surgeries) is often only a Band-Aid on the problem, not a long-term solution. Most common diseases of our time include obesity, type 2 diabetes, hypertension, heart disease, and cancer. The Pritikin Program is recommended and has been proven to help reduce, reverse, and/or prevent the damaging effects of metabolic syndrome.  Nutrition   Overview of the Pritikin Eating Plan  Clinical staff conducted group or individual video education with verbal and written material and guidebook.  Patient learns about the Pritikin Eating Plan for disease risk reduction. The Pritikin Eating Plan  emphasizes a wide variety of unrefined, minimally-processed carbohydrates, like fruits, vegetables, whole grains, and legumes. Go, Caution, and Stop food choices are explained. Plant-based and lean animal proteins are emphasized. Rationale provided for low sodium intake for blood pressure control, low added sugars for blood sugar stabilization, and low added fats and oils for coronary artery disease risk reduction and weight management.  Calorie Density  Clinical staff conducted group or individual video education with verbal and written material and guidebook.  Patient learns about calorie density and how it impacts the Pritikin Eating Plan. Knowing the characteristics of the food you  choose will help you decide whether those foods will lead to weight gain or weight loss, and whether you want to consume more or less of them. Weight loss is usually a side effect of the Pritikin Eating Plan because of its focus on low calorie-dense foods.  Label Reading  Clinical staff conducted group or individual video education with verbal and written material and guidebook.  Patient learns about the Pritikin recommended label reading guidelines and corresponding recommendations regarding calorie density, added sugars, sodium content, and whole grains.  Dining Out - Part 1  Clinical staff conducted group or individual video education with verbal and written material and guidebook.  Patient learns that restaurant meals can be sabotaging because they can be so high in calories, fat, sodium, and/or sugar. Patient learns recommended strategies on how to positively address this and avoid unhealthy pitfalls.  Facts on Fats  Clinical staff conducted group or individual video education with verbal and written material and guidebook.  Patient learns that lifestyle modifications can be just as effective, if not more so, as many medications for lowering your risk of heart disease. A Pritikin lifestyle can help to reduce your  risk of inflammation and atherosclerosis (cholesterol build-up, or plaque, in the artery walls). Lifestyle interventions such as dietary choices and physical activity address the cause of atherosclerosis. A review of the types of fats and their impact on blood cholesterol levels, along with dietary recommendations to reduce fat intake is also included.  Nutrition Action Plan  Clinical staff conducted group or individual video education with verbal and written material and guidebook.  Patient learns how to incorporate Pritikin recommendations into their lifestyle. Recommendations include planning and keeping personal health goals in mind as an important part of their success.  Healthy Mind-Set    Healthy Minds, Bodies, Hearts  Clinical staff conducted group or individual video education with verbal and written material and guidebook.  Patient learns how to identify when they are stressed. Video will discuss the impact of that stress, as well as the many benefits of stress management. Patient will also be introduced to stress management techniques. The way we think, act, and feel has an impact on our hearts.  How Our Thoughts Can Heal Our Hearts  Clinical staff conducted group or individual video education with verbal and written material and guidebook.  Patient learns that negative thoughts can cause depression and anxiety. This can result in negative lifestyle behavior and serious health problems. Cognitive behavioral therapy is an effective method to help control our thoughts in order to change and improve our emotional outlook.  Additional Videos:  Exercise    Improving Performance  Clinical staff conducted group or individual video education with verbal and written material and guidebook.  Patient learns to use a non-linear approach by alternating intensity levels and lengths of time spent exercising to help burn more calories and lose more body fat. Cardiovascular exercise helps improve heart  health, metabolism, hormonal balance, blood sugar control, and recovery from fatigue. Resistance training improves strength, endurance, balance, coordination, reaction time, metabolism, and muscle mass. Flexibility exercise improves circulation, posture, and balance. Seek guidance from your physician and exercise physiologist before implementing an exercise routine and learn your capabilities and proper form for all exercise.  Introduction to Yoga  Clinical staff conducted group or individual video education with verbal and written material and guidebook.  Patient learns about yoga, a discipline of the coming together of mind, breath, and body. The benefits of yoga include improved flexibility, improved  range of motion, better posture and core strength, increased lung function, weight loss, and positive self-image. Yoga's heart health benefits include lowered blood pressure, healthier heart rate, decreased cholesterol and triglyceride levels, improved immune function, and reduced stress. Seek guidance from your physician and exercise physiologist before implementing an exercise routine and learn your capabilities and proper form for all exercise.  Medical   Aging: Enhancing Your Quality of Life  Clinical staff conducted group or individual video education with verbal and written material and guidebook.  Patient learns key strategies and recommendations to stay in good physical health and enhance quality of life, such as prevention strategies, having an advocate, securing a Health Care Proxy and Power of Attorney, and keeping a list of medications and system for tracking them. It also discusses how to avoid risk for bone loss.  Biology of Weight Control  Clinical staff conducted group or individual video education with verbal and written material and guidebook.  Patient learns that weight gain occurs because we consume more calories than we burn (eating more, moving less). Even if your body weight is  normal, you may have higher ratios of fat compared to muscle mass. Too much body fat puts you at increased risk for cardiovascular disease, heart attack, stroke, type 2 diabetes, and obesity-related cancers. In addition to exercise, following the Pritikin Eating Plan can help reduce your risk.  Decoding Lab Results  Clinical staff conducted group or individual video education with verbal and written material and guidebook.  Patient learns that lab test reflects one measurement whose values change over time and are influenced by many factors, including medication, stress, sleep, exercise, food, hydration, pre-existing medical conditions, and more. It is recommended to use the knowledge from this video to become more involved with your lab results and evaluate your numbers to speak with your doctor.   Diseases of Our Time - Overview  Clinical staff conducted group or individual video education with verbal and written material and guidebook.  Patient learns that according to the CDC, 50% to 70% of chronic diseases (such as obesity, type 2 diabetes, elevated lipids, hypertension, and heart disease) are avoidable through lifestyle improvements including healthier food choices, listening to satiety cues, and increased physical activity.  Sleep Disorders Clinical staff conducted group or individual video education with verbal and written material and guidebook.  Patient learns how good quality and duration of sleep are important to overall health and well-being. Patient also learns about sleep disorders and how they impact health along with recommendations to address them, including discussing with a physician.  Nutrition  Dining Out - Part 2 Clinical staff conducted group or individual video education with verbal and written material and guidebook.  Patient learns how to plan ahead and communicate in order to maximize their dining experience in a healthy and nutritious manner. Included are recommended  food choices based on the type of restaurant the patient is visiting.   Fueling a Banker conducted group or individual video education with verbal and written material and guidebook.  There is a strong connection between our food choices and our health. Diseases like obesity and type 2 diabetes are very prevalent and are in large-part due to lifestyle choices. The Pritikin Eating Plan provides plenty of food and hunger-curbing satisfaction. It is easy to follow, affordable, and helps reduce health risks.  Menu Workshop  Clinical staff conducted group or individual video education with verbal and written material and guidebook.  Patient learns that restaurant meals  can sabotage health goals because they are often packed with calories, fat, sodium, and sugar. Recommendations include strategies to plan ahead and to communicate with the manager, chef, or server to help order a healthier meal.  Planning Your Eating Strategy  Clinical staff conducted group or individual video education with verbal and written material and guidebook.  Patient learns about the Pritikin Eating Plan and its benefit of reducing the risk of disease. The Pritikin Eating Plan does not focus on calories. Instead, it emphasizes high-quality, nutrient-rich foods. By knowing the characteristics of the foods, we choose, we can determine their calorie density and make informed decisions.  Targeting Your Nutrition Priorities  Clinical staff conducted group or individual video education with verbal and written material and guidebook.  Patient learns that lifestyle habits have a tremendous impact on disease risk and progression. This video provides eating and physical activity recommendations based on your personal health goals, such as reducing LDL cholesterol, losing weight, preventing or controlling type 2 diabetes, and reducing high blood pressure.  Vitamins and Minerals  Clinical staff conducted group or  individual video education with verbal and written material and guidebook.  Patient learns different ways to obtain key vitamins and minerals, including through a recommended healthy diet. It is important to discuss all supplements you take with your doctor.   Healthy Mind-Set    Smoking Cessation  Clinical staff conducted group or individual video education with verbal and written material and guidebook.  Patient learns that cigarette smoking and tobacco addiction pose a serious health risk which affects millions of people. Stopping smoking will significantly reduce the risk of heart disease, lung disease, and many forms of cancer. Recommended strategies for quitting are covered, including working with your doctor to develop a successful plan.  Culinary   Becoming a Set designer conducted group or individual video education with verbal and written material and guidebook.  Patient learns that cooking at home can be healthy, cost-effective, quick, and puts them in control. Keys to cooking healthy recipes will include looking at your recipe, assessing your equipment needs, planning ahead, making it simple, choosing cost-effective seasonal ingredients, and limiting the use of added fats, salts, and sugars.  Cooking - Breakfast and Snacks  Clinical staff conducted group or individual video education with verbal and written material and guidebook.  Patient learns how important breakfast is to satiety and nutrition through the entire day. Recommendations include key foods to eat during breakfast to help stabilize blood sugar levels and to prevent overeating at meals later in the day. Planning ahead is also a key component.  Cooking - Educational psychologist conducted group or individual video education with verbal and written material and guidebook.  Patient learns eating strategies to improve overall health, including an approach to cook more at home. Recommendations include  thinking of animal protein as a side on your plate rather than center stage and focusing instead on lower calorie dense options like vegetables, fruits, whole grains, and plant-based proteins, such as beans. Making sauces in large quantities to freeze for later and leaving the skin on your vegetables are also recommended to maximize your experience.  Cooking - Healthy Salads and Dressing Clinical staff conducted group or individual video education with verbal and written material and guidebook.  Patient learns that vegetables, fruits, whole grains, and legumes are the foundations of the Pritikin Eating Plan. Recommendations include how to incorporate each of these in flavorful and healthy salads, and how to  create homemade salad dressings. Proper handling of ingredients is also covered. Cooking - Soups and [employer] - Soups and Desserts Clinical staff conducted group or individual video education with verbal and written material and guidebook.  Patient learns that Pritikin soups and desserts make for easy, nutritious, and delicious snacks and meal components that are low in sodium, fat, sugar, and calorie density, while high in vitamins, minerals, and filling fiber. Recommendations include simple and healthy ideas for soups and desserts.   Overview     The Pritikin Solution Program Overview Clinical staff conducted group or individual video education with verbal and written material and guidebook.  Patient learns that the results of the Pritikin Program have been documented in more than 100 articles published in peer-reviewed journals, and the benefits include reducing risk factors for (and, in some cases, even reversing) high cholesterol, high blood pressure, type 2 diabetes, obesity, and more! An overview of the three key pillars of the Pritikin Program will be covered: eating well, doing regular exercise, and having a healthy mind-set.  WORKSHOPS  Exercise: Exercise Basics: Building Your  Action Plan Clinical staff led group instruction and group discussion with PowerPoint presentation and patient guidebook. To enhance the learning environment the use of posters, models and videos may be added. At the conclusion of this workshop, patients will comprehend the difference between physical activity and exercise, as well as the benefits of incorporating both, into their routine. Patients will understand the FITT (Frequency, Intensity, Time, and Type) principle and how to use it to build an exercise action plan. In addition, safety concerns and other considerations for exercise and cardiac rehab will be addressed by the presenter. The purpose of this lesson is to promote a comprehensive and effective weekly exercise routine in order to improve patients' overall level of fitness.   Managing Heart Disease: Your Path to a Healthier Heart Clinical staff led group instruction and group discussion with PowerPoint presentation and patient guidebook. To enhance the learning environment the use of posters, models and videos may be added.At the conclusion of this workshop, patients will understand the anatomy and physiology of the heart. Additionally, they will understand how Pritikin's three pillars impact the risk factors, the progression, and the management of heart disease.  The purpose of this lesson is to provide a high-level overview of the heart, heart disease, and how the Pritikin lifestyle positively impacts risk factors.  Exercise Biomechanics Clinical staff led group instruction and group discussion with PowerPoint presentation and patient guidebook. To enhance the learning environment the use of posters, models and videos may be added. Patients will learn how the structural parts of their bodies function and how these functions impact their daily activities, movement, and exercise. Patients will learn how to promote a neutral spine, learn how to manage pain, and identify ways to  improve their physical movement in order to promote healthy living. The purpose of this lesson is to expose patients to common physical limitations that impact physical activity. Participants will learn practical ways to adapt and manage aches and pains, and to minimize their effect on regular exercise. Patients will learn how to maintain good posture while sitting, walking, and lifting.  Balance Training and Fall Prevention  Clinical staff led group instruction and group discussion with PowerPoint presentation and patient guidebook. To enhance the learning environment the use of posters, models and videos may be added. At the conclusion of this workshop, patients will understand the importance of their sensorimotor skills (vision, proprioception, and  the vestibular system) in maintaining their ability to balance as they age. Patients will apply a variety of balancing exercises that are appropriate for their current level of function. Patients will understand the common causes for poor balance, possible solutions to these problems, and ways to modify their physical environment in order to minimize their fall risk. The purpose of this lesson is to teach patients about the importance of maintaining balance as they age and ways to minimize their risk of falling.  WORKSHOPS   Nutrition:  Fueling a Ship broker led group instruction and group discussion with PowerPoint presentation and patient guidebook. To enhance the learning environment the use of posters, models and videos may be added. Patients will review the foundational principles of the Pritikin Eating Plan and understand what constitutes a serving size in each of the food groups. Patients will also learn Pritikin-friendly foods that are better choices when away from home and review make-ahead meal and snack options. Calorie density will be reviewed and applied to three nutrition priorities: weight maintenance, weight loss, and  weight gain. The purpose of this lesson is to reinforce (in a group setting) the key concepts around what patients are recommended to eat and how to apply these guidelines when away from home by planning and selecting Pritikin-friendly options. Patients will understand how calorie density may be adjusted for different weight management goals.  Mindful Eating  Clinical staff led group instruction and group discussion with PowerPoint presentation and patient guidebook. To enhance the learning environment the use of posters, models and videos may be added. Patients will briefly review the concepts of the Pritikin Eating Plan and the importance of low-calorie dense foods. The concept of mindful eating will be introduced as well as the importance of paying attention to internal hunger signals. Triggers for non-hunger eating and techniques for dealing with triggers will be explored. The purpose of this lesson is to provide patients with the opportunity to review the basic principles of the Pritikin Eating Plan, discuss the value of eating mindfully and how to measure internal cues of hunger and fullness using the Hunger Scale. Patients will also discuss reasons for non-hunger eating and learn strategies to use for controlling emotional eating.  Targeting Your Nutrition Priorities Clinical staff led group instruction and group discussion with PowerPoint presentation and patient guidebook. To enhance the learning environment the use of posters, models and videos may be added. Patients will learn how to determine their genetic susceptibility to disease by reviewing their family history. Patients will gain insight into the importance of diet as part of an overall healthy lifestyle in mitigating the impact of genetics and other environmental insults. The purpose of this lesson is to provide patients with the opportunity to assess their personal nutrition priorities by looking at their family history, their own health  history and current risk factors. Patients will also be able to discuss ways of prioritizing and modifying the Pritikin Eating Plan for their highest risk areas  Menu  Clinical staff led group instruction and group discussion with PowerPoint presentation and patient guidebook. To enhance the learning environment the use of posters, models and videos may be added. Using menus brought in from [employer], or printed from [employer], patients will apply the Pritikin dining out guidelines that were presented in the [employer] video. Patients will also be able to practice these guidelines in a variety of provided scenarios. The purpose of this lesson is to provide patients with the opportunity  to practice hands-on learning of the [employer] guidelines with actual menus and practice scenarios.  Label Reading Clinical staff led group instruction and group discussion with PowerPoint presentation and patient guidebook. To enhance the learning environment the use of posters, models and videos may be added. Patients will review and discuss the Pritikin label reading guidelines presented in Pritikin's Label Reading Educational series video. Using fool labels brought in from local grocery stores and markets, patients will apply the label reading guidelines and determine if the packaged food meet the Pritikin guidelines. The purpose of this lesson is to provide patients with the opportunity to review, discuss, and practice hands-on learning of the Pritikin Label Reading guidelines with actual packaged food labels. Cooking School  Pritikin's [employer] are designed to teach patients ways to prepare quick, simple, and affordable recipes at home. The importance of nutrition's role in chronic disease risk reduction is reflected in its emphasis in the overall Pritikin program. By learning how to prepare essential core Pritikin Eating Plan recipes, patients will increase  control over what they eat; be able to customize the flavor of foods without the use of added salt, sugar, or fat; and improve the quality of the food they consume. By learning a set of core recipes which are easily assembled, quickly prepared, and affordable, patients are more likely to prepare more healthy foods at home. These workshops focus on convenient breakfasts, simple entres, side dishes, and desserts which can be prepared with minimal effort and are consistent with nutrition recommendations for cardiovascular risk reduction. Cooking [employer] are taught by a Armed forces logistics/support/administrative officer (RD) who has been trained by the [employer]. The chef or RD has a clear understanding of the importance of minimizing - if not completely eliminating - added fat, sugar, and sodium in recipes. Throughout the series of Cooking School Workshop sessions, patients will learn about healthy ingredients and efficient methods of cooking to build confidence in their capability to prepare    Cooking School weekly topics:  Adding Flavor- Sodium-Free  Fast and Healthy Breakfasts  Powerhouse Plant-Based Proteins  Satisfying Salads and Dressings  Simple Sides and Sauces  International Cuisine-Spotlight on the [employer] Zones  Delicious Desserts  Savory Soups  [employer] - Meals in a Astronomer Appetizers and Snacks  Comforting Weekend Breakfasts  One-Pot Wonders   Fast Evening Meals  Landscape architect Your Pritikin Plate  WORKSHOPS   Healthy Mindset (Psychosocial):  Focused Goals, Sustainable Changes Clinical staff led group instruction and group discussion with PowerPoint presentation and patient guidebook. To enhance the learning environment the use of posters, models and videos may be added. Patients will be able to apply effective goal setting strategies to establish at least one personal goal, and then take consistent, meaningful action toward that goal. They will  learn to identify common barriers to achieving personal goals and develop strategies to overcome them. Patients will also gain an understanding of how our mind-set can impact our ability to achieve goals and the importance of cultivating a positive and growth-oriented mind-set. The purpose of this lesson is to provide patients with a deeper understanding of how to set and achieve personal goals, as well as the tools and strategies needed to overcome common obstacles which may arise along the way.  From Head to Heart: The Power of a Healthy Outlook  Clinical staff led group instruction and group discussion with PowerPoint presentation and patient guidebook. To enhance the learning environment  the use of posters, models and videos may be added. Patients will be able to recognize and describe the impact of emotions and mood on physical health. They will discover the importance of self-care and explore self-care practices which may work for them. Patients will also learn how to utilize the 4 C's to cultivate a healthier outlook and better manage stress and challenges. The purpose of this lesson is to demonstrate to patients how a healthy outlook is an essential part of maintaining good health, especially as they continue their cardiac rehab journey.  Healthy Sleep for a Healthy Heart Clinical staff led group instruction and group discussion with PowerPoint presentation and patient guidebook. To enhance the learning environment the use of posters, models and videos may be added. At the conclusion of this workshop, patients will be able to demonstrate knowledge of the importance of sleep to overall health, well-being, and quality of life. They will understand the symptoms of, and treatments for, common sleep disorders. Patients will also be able to identify daytime and nighttime behaviors which impact sleep, and they will be able to apply these tools to help manage sleep-related challenges. The purpose of this  lesson is to provide patients with a general overview of sleep and outline the importance of quality sleep. Patients will learn about a few of the most common sleep disorders. Patients will also be introduced to the concept of "sleep hygiene," and discover ways to self-manage certain sleeping problems through simple daily behavior changes. Finally, the workshop will motivate patients by clarifying the links between quality sleep and their goals of heart-healthy living.   Recognizing and Reducing Stress Clinical staff led group instruction and group discussion with PowerPoint presentation and patient guidebook. To enhance the learning environment the use of posters, models and videos may be added. At the conclusion of this workshop, patients will be able to understand the types of stress reactions, differentiate between acute and chronic stress, and recognize the impact that chronic stress has on their health. They will also be able to apply different coping mechanisms, such as reframing negative self-talk. Patients will have the opportunity to practice a variety of stress management techniques, such as deep abdominal breathing, progressive muscle relaxation, and/or guided imagery.  The purpose of this lesson is to educate patients on the role of stress in their lives and to provide healthy techniques for coping with it.  Learning Barriers/Preferences:  Learning Barriers/Preferences - 01/07/24 1456       Learning Barriers/Preferences   Learning Barriers Sight;Hearing   glasses/hearing aids   Learning Preferences Computer/Internet;Group Instruction;Individual Instruction;Skilled Demonstration;Verbal Instruction;Video;Written Material;Pictoral          Education Topics:  Knowledge Questionnaire Score:  Knowledge Questionnaire Score - 01/07/24 1456       Knowledge Questionnaire Score   Pre Score 21/24          Core Components/Risk Factors/Patient Goals at Admission:  Personal Goals and  Risk Factors at Admission - 01/07/24 1457       Core Components/Risk Factors/Patient Goals on Admission    Weight Management Yes    Intervention Weight Management: Develop a combined nutrition and exercise program designed to reach desired caloric intake, while maintaining appropriate intake of nutrient and fiber, sodium and fats, and appropriate energy expenditure required for the weight goal.;Weight Management: Provide education and appropriate resources to help participant work on and attain dietary goals.    Expected Outcomes Short Term: Continue to assess and modify interventions until short term weight is achieved;Long Term:  Adherence to nutrition and physical activity/exercise program aimed toward attainment of established weight goal;Understanding recommendations for meals to include 15-35% energy as protein, 25-35% energy from fat, 35-60% energy from carbohydrates, less than 200mg  of dietary cholesterol, 20-35 gm of total fiber daily;Understanding of distribution of calorie intake throughout the day with the consumption of 4-5 meals/snacks    Diabetes Yes    Intervention Provide education about proper nutrition, including hydration, and aerobic/resistive exercise prescription along with prescribed medications to achieve blood glucose in normal ranges: Fasting glucose 65-99 mg/dL;Provide education about signs/symptoms and action to take for hypo/hyperglycemia.    Expected Outcomes Long Term: Attainment of HbA1C < 7%.;Short Term: Participant verbalizes understanding of the signs/symptoms and immediate care of hyper/hypoglycemia, proper foot care and importance of medication, aerobic/resistive exercise and nutrition plan for blood glucose control.    Hypertension Yes    Intervention Monitor prescription use compliance.;Provide education on lifestyle modifcations including regular physical activity/exercise, weight management, moderate sodium restriction and increased consumption of fresh fruit,  vegetables, and low fat dairy, alcohol  moderation, and smoking cessation.    Expected Outcomes Long Term: Maintenance of blood pressure at goal levels.;Short Term: Continued assessment and intervention until BP is < 140/65mm HG in hypertensive participants. < 130/42mm HG in hypertensive participants with diabetes, heart failure or chronic kidney disease.    Lipids Yes    Intervention Provide education and support for participant on nutrition & aerobic/resistive exercise along with prescribed medications to achieve LDL 70mg , HDL >40mg .    Expected Outcomes Long Term: Cholesterol controlled with medications as prescribed, with individualized exercise RX and with personalized nutrition plan. Value goals: LDL < 70mg , HDL > 40 mg.;Short Term: Participant states understanding of desired cholesterol values and is compliant with medications prescribed. Participant is following exercise prescription and nutrition guidelines.    Stress Yes    Intervention Refer participants experiencing significant psychosocial distress to appropriate mental health specialists for further evaluation and treatment. When possible, include family members and significant others in education/counseling sessions.;Offer individual and/or small group education and counseling on adjustment to heart disease, stress management and health-related lifestyle change. Teach and support self-help strategies.    Expected Outcomes Long Term: Emotional wellbeing is indicated by absence of clinically significant psychosocial distress or social isolation.;Short Term: Participant demonstrates changes in health-related behavior, relaxation and other stress management skills, ability to obtain effective social support, and compliance with psychotropic medications if prescribed.          Core Components/Risk Factors/Patient Goals Review:    Core Components/Risk Factors/Patient Goals at Discharge (Final Review):    ITP Comments:  ITP Comments      Row Name 01/07/24 1416 01/14/24 0916         ITP Comments Dr. Wilbert Bihari medical director. Introduction to pritikin education/intensive cardiac rehab. Initial orientation packet reviewed with patient. 30 Day ITP Review. Bob's exercise is currently on hold as Octaviano was admitted to the [facility] and was discharged on 01/10/24         Comments: See ITP Comments

## 2024-01-20 ENCOUNTER — Telehealth (HOSPITAL_COMMUNITY): Payer: Self-pay

## 2024-01-20 ENCOUNTER — Encounter (HOSPITAL_COMMUNITY): Admission: RE | Admit: 2024-01-20 | Source: Ambulatory Visit

## 2024-01-20 NOTE — Telephone Encounter (Signed)
 Patient c/o for 10:15 CR class, states he is going to conference and will not return to class until 9/29. Cancelled 6 classes.

## 2024-01-22 ENCOUNTER — Encounter (HOSPITAL_COMMUNITY)

## 2024-01-25 ENCOUNTER — Encounter (HOSPITAL_COMMUNITY)

## 2024-01-27 ENCOUNTER — Encounter (HOSPITAL_COMMUNITY)

## 2024-01-29 ENCOUNTER — Encounter (HOSPITAL_COMMUNITY)

## 2024-02-01 ENCOUNTER — Encounter (HOSPITAL_COMMUNITY)

## 2024-02-03 ENCOUNTER — Encounter (HOSPITAL_COMMUNITY)

## 2024-02-05 ENCOUNTER — Encounter (HOSPITAL_COMMUNITY): Admission: RE | Admit: 2024-02-05 | Source: Ambulatory Visit

## 2024-02-08 ENCOUNTER — Encounter (HOSPITAL_COMMUNITY): Admission: RE | Admit: 2024-02-08

## 2024-02-10 ENCOUNTER — Telehealth (HOSPITAL_COMMUNITY): Payer: Self-pay

## 2024-02-10 ENCOUNTER — Encounter (HOSPITAL_COMMUNITY): Admission: RE | Admit: 2024-02-10

## 2024-02-10 NOTE — Telephone Encounter (Signed)
 Welfare check.  Pt stated he would be out until 9/29, but did not call/show on 9/29 or today.  Requested pt to return call and inform us  if/when he will be able to return and start his 1st cardiac rehab session (0 attended since orientation).

## 2024-02-12 ENCOUNTER — Encounter (HOSPITAL_COMMUNITY): Admission: RE | Admit: 2024-02-12 | Source: Ambulatory Visit

## 2024-02-15 ENCOUNTER — Encounter (HOSPITAL_COMMUNITY): Admission: RE | Admit: 2024-02-15

## 2024-02-16 ENCOUNTER — Encounter (HOSPITAL_COMMUNITY): Payer: Self-pay | Admitting: *Deleted

## 2024-02-16 DIAGNOSIS — I2111 ST elevation (STEMI) myocardial infarction involving right coronary artery: Secondary | ICD-10-CM

## 2024-02-16 DIAGNOSIS — Z955 Presence of coronary angioplasty implant and graft: Secondary | ICD-10-CM

## 2024-02-16 NOTE — Progress Notes (Addendum)
 Patient attended orientation on 01/07/24. Patient came to exercise at cardiac rehab but did not exercise due to hospital admission over the previous weekend.Mr Reamer did not return to begin exercise.  Patient has been discharged due to nonattendance.Hadassah Elpidio Quan RN BSN

## 2024-02-17 ENCOUNTER — Encounter (HOSPITAL_COMMUNITY): Admission: RE | Admit: 2024-02-17

## 2024-02-19 ENCOUNTER — Encounter (HOSPITAL_COMMUNITY)

## 2024-02-22 ENCOUNTER — Encounter (HOSPITAL_COMMUNITY)

## 2024-02-24 ENCOUNTER — Encounter (HOSPITAL_COMMUNITY)

## 2024-02-26 ENCOUNTER — Encounter (HOSPITAL_COMMUNITY)

## 2024-02-29 ENCOUNTER — Encounter (HOSPITAL_COMMUNITY)

## 2024-03-02 ENCOUNTER — Encounter (HOSPITAL_COMMUNITY)

## 2024-03-04 ENCOUNTER — Encounter (HOSPITAL_COMMUNITY)

## 2024-03-07 ENCOUNTER — Encounter (HOSPITAL_COMMUNITY)

## 2024-03-09 ENCOUNTER — Encounter (HOSPITAL_COMMUNITY)

## 2024-03-11 ENCOUNTER — Encounter (HOSPITAL_COMMUNITY)

## 2024-03-14 ENCOUNTER — Encounter (HOSPITAL_COMMUNITY)

## 2024-03-16 ENCOUNTER — Encounter (HOSPITAL_COMMUNITY)

## 2024-03-18 ENCOUNTER — Encounter (HOSPITAL_COMMUNITY)

## 2024-03-21 ENCOUNTER — Encounter (HOSPITAL_COMMUNITY)

## 2024-03-23 ENCOUNTER — Encounter (HOSPITAL_COMMUNITY)

## 2024-03-25 ENCOUNTER — Encounter (HOSPITAL_COMMUNITY)

## 2024-03-28 ENCOUNTER — Encounter: Payer: Self-pay | Admitting: Cardiovascular Disease

## 2024-03-28 ENCOUNTER — Ambulatory Visit: Attending: Cardiovascular Disease | Admitting: Cardiovascular Disease

## 2024-03-28 VITALS — BP 120/80 | HR 65 | Ht 72.0 in | Wt 216.6 lb

## 2024-03-28 DIAGNOSIS — I472 Ventricular tachycardia, unspecified: Secondary | ICD-10-CM | POA: Diagnosis not present

## 2024-03-28 DIAGNOSIS — E782 Mixed hyperlipidemia: Secondary | ICD-10-CM

## 2024-03-28 DIAGNOSIS — N184 Chronic kidney disease, stage 4 (severe): Secondary | ICD-10-CM

## 2024-03-28 DIAGNOSIS — I10 Essential (primary) hypertension: Secondary | ICD-10-CM | POA: Diagnosis not present

## 2024-03-28 DIAGNOSIS — I2510 Atherosclerotic heart disease of native coronary artery without angina pectoris: Secondary | ICD-10-CM | POA: Diagnosis not present

## 2024-03-28 MED ORDER — METOPROLOL SUCCINATE ER 25 MG PO TB24: 25.0000 mg | ORAL_TABLET | Freq: Every day | ORAL | 3 refills | Status: AC

## 2024-03-28 MED ORDER — DOXAZOSIN MESYLATE 1 MG PO TABS: 1.0000 mg | ORAL_TABLET | Freq: Every day | ORAL | 3 refills | Status: AC

## 2024-03-28 MED ORDER — TICAGRELOR 90 MG PO TABS: 90.0000 mg | ORAL_TABLET | Freq: Two times a day (BID) | ORAL | 3 refills | Status: AC

## 2024-03-28 MED ORDER — EZETIMIBE 10 MG PO TABS: 10.0000 mg | ORAL_TABLET | Freq: Every day | ORAL | 3 refills | Status: AC

## 2024-03-28 NOTE — Progress Notes (Signed)
 Cardiology Office Note:    Date:  03/28/2024   ID:  Randall Frazier, Randall Frazier 1955/06/04, MRN [id number]  PCP:  [facility]   [facility] Providers Cardiologist:  Ozell Fell, MD     Referring MD: [facility]   Chief Complaint  Patient presents with   Coronary Artery Disease    History of Present Illness:    Randall Frazier is a 68 y.o. male presenting for follow-up of CAD.  The patient has a history of multivessel CAD and prior CABG.  He has had a complicated medical history that includes liver transplantation, autoimmune disease, and chronic fungal sinusitis.  He had an acute inferior STEMI in June 2025 complicated by the presence of complete heart block.  He was found to have total occlusion of the distal RCA and was treated with PCI.  His LIMA to LAD graft was patent.  His saphenous vein graft to the ramus intermedius/OM1 was patent.  Temporary pacing was necessary during the procedure, but his heart block resolved with reperfusion.  An echocardiogram that same admission demonstrated LVEF 40 to 45% with severe hypokinesis of the inferior wall, apical inferoseptum, and apical walls.  There was moderate reduction of RV systolic function.  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  He was ultimately transition from lidocaine  to mexiletine and underwent formal evaluation by EP.  When he was seen for EP follow-up, he had been off of mexiletine with no further symptoms of arrhythmia and was felt to have no long-term indication for antiarrhythmic drug or indication for ICD.  The patient is here with his wife today.  He reports that he has been doing really well.  He denies any recent history of chest pain, chest pressure, or shortness of breath.  He denies leg swelling, heart palpitations, orthopnea, or PND.  He reports stable follow-up with his other medical providers.   Current  Medications: Current Meds  Medication Sig   Alirocumab  (PRALUENT ) 150 MG/ML SOAJ Inject 1 mL (150 mg total) into the skin every 14 (fourteen) days.   Artificial Tears ophthalmic solution Place 1 drop into both eyes as needed for dry eyes.   aspirin  81 MG chewable tablet Chew 81 mg by mouth daily.   coal tar (NEUTROGENA T-GEL) 0.5 % shampoo Apply 1 Application topically at bedtime as needed (psorasis).   hydrocortisone  (CORTEF ) 10 MG tablet Take 5-10 mg by mouth See admin instructions. Take 1 tablet (10mg ) by mouth in the morning and take 1/2 tablet (5mg ) at bedtime.   Isavuconazonium Sulfate  (CRESEMBA ) 186 MG CAPS Take 372 mg by mouth daily.   lidocaine  4 % Place 1 patch onto the skin daily as needed (pain).   mycophenolate  (CELLCEPT ) 250 MG capsule Take 1 capsule (250 mg total) by mouth 2 (two) times daily.   nitroGLYCERIN  (NITROSTAT ) 0.4 MG SL tablet Place 1 tablet (0.4 mg total) under the tongue every 5 (five) minutes x 3 doses as needed for chest pain.   omeprazole (PRILOSEC) 40 MG capsule Take 40 mg by mouth daily.   polyethylene glycol powder (GLYCOLAX /MIRALAX ) 17 GM/SCOOP powder Take 17 g by mouth daily as needed for severe constipation.   sertraline  (ZOLOFT ) 100 MG tablet Take 100 mg by mouth daily.   sildenafil (VIAGRA) 100 MG tablet Take 100 mg by mouth daily as needed for erectile dysfunction.   silver sulfADIAZINE (SILVADENE) 1 % cream Apply 1 Application topically daily.  triamcinolone cream (KENALOG) 0.1 % Apply 1 Application topically as needed (rash arms).   [DISCONTINUED] doxazosin (CARDURA) 1 MG tablet Take 1 mg by mouth daily.   [DISCONTINUED] ezetimibe  (ZETIA ) 10 MG tablet Take 1 tablet (10 mg total) by mouth daily. (Patient taking differently: Take 1 tablet (10 mg total) by mouth daily.)   [DISCONTINUED] metoprolol  succinate (TOPROL -XL) 25 MG 24 hr tablet Take 1 tablet (25 mg total) by mouth daily.   [DISCONTINUED] mexiletine (MEXITIL ) 250 MG capsule Take 1 capsule (250 mg  total) by mouth every 12 (twelve) hours.   [DISCONTINUED] omega-3 acid ethyl esters (LOVAZA ) 1 g capsule Take 2 capsules (2 g total) by mouth 2 (two) times daily.   [DISCONTINUED] potassium chloride  SA (KLOR-CON  M) 20 MEQ tablet Take 2 tablets (40 mEq total) by mouth 2 (two) times daily.   [DISCONTINUED] sirolimus  (RAPAMUNE ) 1 MG tablet Take 1 mg by mouth daily.   [DISCONTINUED] ticagrelor  (BRILINTA ) 90 MG TABS tablet Take 1 tablet (90 mg total) by mouth 2 (two) times daily.     Allergies:   Bromsite [bromfenac sodium], Desyrel  [trazodone ], Lotensin [benazepril], Mobic [meloxicam], Neurontin [gabapentin], Nsaids, Pork allergy, Pravachol [pravastatin], Statins, Toradol  [ketorolac  tromethamine ], Zocor [simvastatin], Niaspan [niacin], and Zestril [lisinopril]   ROS:   Please see the history of present illness.    All other systems reviewed and are negative.  EKGs/Labs/Other Studies Reviewed:    The following studies were reviewed today: Cardiac Studies & Procedures   ______________________________________________________________________________________________ CARDIAC CATHETERIZATION  CARDIAC CATHETERIZATION 11/07/2023  Conclusion 1.  Acute STEMI involving the RCA complicated by complete heart block and hypotension.  Distal RCA 100% thrombotic occlusion at baseline, treated with primary PCI necessitating a guide extension catheter, with 0% residual stenosis and TIMI-3 flow after stenting with a 3.0 x 20 mm Synergy DES 2.  Severe distal left main stenosis 3.  Total occlusion of the proximal LAD with patent LIMA to LAD graft 4.  Severe proximal and mid circumflex stenoses with a patent saphenous vein graft to ramus intermedius/OM1 5.  Complete heart block complicating acute MI, necessitating temporary venous pacing but resolving after reperfusion 6.  Normal LVEDP 12 mmHg, serial lactate 1.8--->1.5  Recommendations: Gentle fluid hydration to minimize risk of AKI on background of chronic kidney  disease (creatinine 2.5 today).  DAPT with aspirin  and ticagrelor  12 months without interruption.  Check 2D echo for assessment of LV function.  Favor medical therapy for residual CAD in the context of the patient's comorbid medical problems.  Findings Coronary Findings Diagnostic  Dominance: Right  Left Main Mid LM to Dist LM lesion is 90% stenosed. The lesion is severely calcified.  Left Anterior Descending Prox LAD lesion is 100% stenosed.  Left Circumflex Prox Cx to Mid Cx lesion is 80% stenosed. Dist Cx lesion is 90% stenosed.  Right Coronary Artery There is mild diffuse disease throughout the vessel. The vessel is severely calcified. The RCA is severely calcified.  The vessel has mild diffuse disease through the proximal and mid portions and then total occlusion in the distal vessel with TIMI 0 flow. Dist RCA lesion is 100% stenosed.  LIMA LIMA Graft To Mid LAD LIMA graft was visualized by angiography.  The graft exhibits no disease. The LIMA to LAD graft is widely patent with no stenosis.  The LAD wraps around the LV apex.  Saphenous Graft To 1st Mrg SVG.  Saphenous vein graft OM1/ramus is patent with no significant stenosis.  Intervention  Dist RCA lesion Stent CATH INFINITI 5FR  AL1 guide catheter was inserted. Lesion crossed with guidewire using a WIRE RUNTHROUGH IZANAI 014 180. Pre-stent angioplasty was performed using a BALLOON EMERGE MR 2.5X15. A drug-eluting stent was successfully placed using a STENT SYNERGY XD 3.0X20. Post-stent angioplasty was performed using a BALLOON [place] EMERGE MR 3.25X15. Post-Intervention Lesion Assessment The intervention was successful. Pre-interventional TIMI flow is 0. Post-intervention TIMI flow is 3. No complications occurred at this lesion. There is a 0% residual stenosis post intervention.     ECHOCARDIOGRAM  ECHOCARDIOGRAM COMPLETE 11/08/2023  Narrative ECHOCARDIOGRAM REPORT    Patient Name:   Randall Frazier Date of Exam:  11/08/2023 Medical Rec #:  [id number]      Height:       72.0 in Accession #:    [id number]     Weight:       216.0 lb Date of Birth:  April 13, 1956      BSA:          2.201 m Patient Age:    67 years       BP:           95/51 mmHg Patient Gender: M              HR:           62 bpm. Exam Location:  Inpatient  Procedure: 2D Echo, Cardiac Doppler, Color Doppler and Intracardiac Opacification Agent (Both Spectral and Color Flow Doppler were utilized during procedure).  Indications:    I25.110 Atherosclerotic heart disease of native coronary artery with unstable angina pectoris; 122-I22.9 Subsequent ST elevation (STEM) and non-ST elevation (NSTEMI) myocardial infarction  History:        Patient has prior history of Echocardiogram examinations, most recent 09/14/2019. Previous Myocardial Infarction and CAD, Abnormal ECG, Arrythmias:Bradycardia, Signs/Symptoms:Altered Mental Status, Shortness of Breath, Dyspnea and Chest Pain; Risk Factors:Hypertension. PFO.  Sonographer:    Ellouise Mose RDCS Referring Phys: [phone] Bryna Razavi   Sonographer Comments: Technically difficult study due to poor echo windows. IMPRESSIONS   1. There is no left ventricular thrombus (definity  contrast was used). Left ventricular ejection fraction, by estimation, is 40 to 45%. The left ventricle has mildly decreased function. The left ventricle demonstrates regional wall motion abnormalities (see scoring diagram/findings for description). Left ventricular diastolic parameters are consistent with Grade I diastolic dysfunction (impaired relaxation). There is severe hypokinesis of the left ventricular, mid-apical inferoseptal wall, inferior wall and apical segment. 2. Right ventricular systolic function is moderately reduced. The right ventricular size is moderately enlarged. The estimated right ventricular systolic pressure is 16.1 mmHg. 3. Left atrial size was mildly dilated. 4. The mitral valve is normal in structure.  Moderate mitral valve regurgitation. No evidence of mitral stenosis. 5. The aortic valve is grossly normal. Aortic valve regurgitation is not visualized. No aortic stenosis is present. 6. Evidence of atrial level shunting detected by color flow Doppler.  Comparison(s): Prior images reviewed side by side. Changes from prior study are noted. The left ventricular function is worsened. The left ventricular wall motion new. There is interval reduction in left ventricular function due to infarction in the distribution of the right coronary artery.  Conclusion(s)/Recommendation(s): No left ventricular mural or apical thrombus/thrombi.  FINDINGS Left Ventricle: There is no left ventricular thrombus (definity  contrast was used). Left ventricular ejection fraction, by estimation, is 40 to 45%. The left ventricle has mildly decreased function. The left ventricle demonstrates regional wall motion abnormalities. Severe hypokinesis of the left ventricular, mid-apical inferoseptal wall, inferior wall and apical segment.  Definity  contrast agent was given IV to delineate the left ventricular endocardial borders. The left ventricular internal cavity size was normal in size. There is no left ventricular hypertrophy. Left ventricular diastolic parameters are consistent with Grade I diastolic dysfunction (impaired relaxation). Normal left ventricular filling pressure.   LV Wall Scoring: The mid and distal inferior wall, mid inferoseptal segment, apical septal segment, and apex are hypokinetic.  Right Ventricle: The right ventricular size is moderately enlarged. No increase in right ventricular wall thickness. Right ventricular systolic function is moderately reduced. The tricuspid regurgitant velocity is 1.81 m/s, and with an assumed right atrial pressure of 3 mmHg, the estimated right ventricular systolic pressure is 16.1 mmHg.  Left Atrium: Left atrial size was mildly dilated.  Right Atrium: Right atrial size  was normal in size.  Pericardium: There is no evidence of pericardial effusion.  Mitral Valve: The mitral valve is normal in structure. Moderate mitral valve regurgitation, with centrally-directed jet. No evidence of mitral valve stenosis.  Tricuspid Valve: The tricuspid valve is normal in structure. Tricuspid valve regurgitation is trivial. No evidence of tricuspid stenosis.  Aortic Valve: The aortic valve is grossly normal. Aortic valve regurgitation is not visualized. No aortic stenosis is present.  Pulmonic Valve: The pulmonic valve was normal in structure. Pulmonic valve regurgitation is not visualized. No evidence of pulmonic stenosis.  Aorta: The aortic root and ascending aorta are structurally normal, with no evidence of dilitation.  IAS/Shunts: Evidence of atrial level shunting detected by color flow Doppler.   LEFT VENTRICLE PLAX 2D LVIDd:         4.60 cm      Diastology LVIDs:         3.40 cm      LV e' medial:    5.87 cm/s LV PW:         1.10 cm      LV E/e' medial:  12.7 LV IVS:        1.20 cm      LV e' lateral:   11.20 cm/s LVOT diam:     2.30 cm      LV E/e' lateral: 6.7 LV SV:         85 LV SV Index:   39 LVOT Area:     4.15 cm  LV Volumes (MOD) LV vol d, MOD A2C: 81.2 ml LV vol d, MOD A4C: 113.0 ml LV vol s, MOD A2C: 45.9 ml LV vol s, MOD A4C: 66.6 ml LV SV MOD A2C:     35.3 ml LV SV MOD A4C:     113.0 ml LV SV MOD BP:      41.6 ml  RIGHT VENTRICLE            IVC RV S prime:     4.90 cm/s  IVC diam: 1.30 cm TAPSE (M-mode): 0.3 cm  LEFT ATRIUM             Index        RIGHT ATRIUM           Index LA diam:        4.10 cm 1.86 cm/m   RA Area:     10.70 cm LA Vol (A2C):   24.2 ml 10.99 ml/m  RA Volume:   21.80 ml  9.90 ml/m LA Vol (A4C):   40.1 ml 18.22 ml/m LA Biplane Vol: 32.6 ml 14.81 ml/m AORTIC VALVE LVOT Vmax:   96.60 cm/s LVOT Vmean:  61.600 cm/s LVOT VTI:  0.205 m  AORTA Ao Root diam: 3.60 cm Ao Asc diam:  3.60 cm  MITRAL VALVE                TRICUSPID VALVE MV Area (PHT): 3.17 cm    TR Peak grad:   13.1 mmHg MV Decel Time: 239 msec    TR Vmax:        181.00 cm/s MV E velocity: 74.50 cm/s MV A velocity: 94.60 cm/s  SHUNTS MV E/A ratio:  0.79        Systemic VTI:  0.20 m Systemic Diam: 2.30 cm  Jerel Balding MD Electronically signed by Jerel Balding MD Signature Date/Time: 11/08/2023/12:16:43 PM    Final          ______________________________________________________________________________________________      EKG:        Recent Labs: 12/26/2023: ALT 13; BUN 23; Creatinine, Ser 2.23; Hemoglobin 9.6; Magnesium  2.2; Platelets 193; Potassium 3.8; Sodium 139  Recent Lipid Panel    Component Value Date/Time   CHOL 260 (H) 11/08/2023 0346   TRIG 522 (H) 11/08/2023 0346   HDL 24 (L) 11/08/2023 0346   CHOLHDL 10.8 11/08/2023 0346   VLDL UNABLE TO CALCULATE IF TRIGLYCERIDE OVER 400 mg/dL 93/70/7974 9653   LDLCALC UNABLE TO CALCULATE IF TRIGLYCERIDE OVER 400 mg/dL 93/70/7974 9653   LDLDIRECT 155 (H) 11/08/2023 0346     Risk Assessment/Calculations:                Physical Exam:    VS:  BP 120/80 (BP Location: Right Arm, Patient Position: Sitting, Cuff Size: Normal)   Pulse 65   Ht 6' (1.829 m)   Wt 216 lb 9.6 oz (98.2 kg)   SpO2 98%   BMI 29.38 kg/m     Wt Readings from Last 3 Encounters:  03/28/24 216 lb 9.6 oz (98.2 kg)  01/07/24 208 lb 8.9 oz (94.6 kg)  01/06/24 209 lb 3.2 oz (94.9 kg)     GEN:  Well nourished, well developed in no acute distress HEENT: Normal NECK: No JVD; No carotid bruits LYMPHATICS: No lymphadenopathy CARDIAC: RRR, no murmurs, rubs, gallops RESPIRATORY:  Clear to auscultation without rales, wheezing or rhonchi  ABDOMEN: Soft, non-tender, non-distended MUSCULOSKELETAL:  No edema; No deformity  SKIN: Warm and dry NEUROLOGIC:  Alert and oriented x 3 PSYCHIATRIC:  Normal affect   Assessment & Plan Coronary artery disease involving native coronary artery of  native heart without angina pectoris The patient is clinically stable on DAPT with aspirin  and ticagrelor  as well as a PCSK9 inhibitor.  No changes are made in his medical regimen today.  I am going to refer him back to cardiac rehab as he was eager to do this, but then developed acute diverticulitis and was unable to continue with it.  I think he just went to the orientation session but never was really able to get started.  Will have him return for follow-up in June 2026 when he is out to 1 year from his MI.  As long as he is stable at that time, he can discontinue aspirin  and remain on ticagrelor . VT (ventricular tachycardia) (HCC) No recurrence.  Reviewed EP notes.  Not requiring any medical therapy at present.  Continue metoprolol  succinate 25 mg daily. Mixed hyperlipidemia Treated with Praluent .  Patient due for labs.  His last lipids were at the time of his MI when his LDL cholesterol is 155.  Will schedule him for lipids and LFTs. Essential hypertension Blood pressure is  controlled on metoprolol  succinate. Chronic kidney disease (CKD), stage IV (severe) (HCC) Most recent creatinine is 2.59.  Followed closely by his other medical providers.     Cardiac Rehabilitation Eligibility Assessment  The patient is ready to start cardiac rehabilitation from a cardiac standpoint.          Medication Adjustments/Labs and Tests Ordered: Current medicines are reviewed at length with the patient today.  Concerns regarding medicines are outlined above.  Orders Placed This Encounter  Procedures   Comprehensive metabolic panel with GFR   Lipid panel   AMB referral to cardiac rehabilitation   Meds ordered this encounter  Medications   metoprolol  succinate (TOPROL -XL) 25 MG 24 hr tablet    Sig: Take 1 tablet (25 mg total) by mouth daily.    Dispense:  180 tablet    Refill:  3   ticagrelor  (BRILINTA ) 90 MG TABS tablet    Sig: Take 1 tablet (90 mg total) by mouth 2 (two) times daily.     Dispense:  180 tablet    Refill:  3   doxazosin (CARDURA) 1 MG tablet    Sig: Take 1 tablet (1 mg total) by mouth daily.    Dispense:  90 tablet    Refill:  3   ezetimibe  (ZETIA ) 10 MG tablet    Sig: Take 1 tablet (10 mg total) by mouth daily.    Dispense:  90 tablet    Refill:  3    Patient Instructions  Medication Instructions:  Your physician recommends that you continue on your current medications as directed. Please refer to the Current Medication list given to you today.  *If you need a refill on your cardiac medications before your next appointment, please call your pharmacy*  Lab Work: Fasting lipids and CMET - please go to [facility] to have these drawn. You do not need an appointment.  Follow-Up: At [facility], you and your health needs are our priority.  As part of our continuing mission to provide you with exceptional heart care, our providers are all part of one team.  This team includes your primary Cardiologist (physician) and Advanced Practice Providers or APPs (Physician Assistants and Nurse Practitioners) who all work together to provide you with the care you need, when you need it.  Your next appointment:  June 2026  Provider:   Ozell Fell, MD  or APP  You have been referred back to cardiac rehab      Signed, Ozell Fell, MD  03/28/2024 12:39 PM    [facility]

## 2024-03-28 NOTE — Assessment & Plan Note (Signed)
 Blood pressure is controlled on metoprolol  succinate.

## 2024-03-28 NOTE — Patient Instructions (Signed)
 Medication Instructions:  Your physician recommends that you continue on your current medications as directed. Please refer to the Current Medication list given to you today.  *If you need a refill on your cardiac medications before your next appointment, please call your pharmacy*  Lab Work: Fasting lipids and CMET - please go to [facility] to have these drawn. You do not need an appointment.  Follow-Up: At [facility], you and your health needs are our priority.  As part of our continuing mission to provide you with exceptional heart care, our providers are all part of one team.  This team includes your primary Cardiologist (physician) and Advanced Practice Providers or APPs (Physician Assistants and Nurse Practitioners) who all work together to provide you with the care you need, when you need it.  Your next appointment:  June 2026  Provider:   Ozell Fell, MD  or APP  You have been referred back to cardiac rehab

## 2024-03-28 NOTE — Assessment & Plan Note (Signed)
 Treated with Praluent .  Patient due for labs.  His last lipids were at the time of his MI when his LDL cholesterol is 155.  Will schedule him for lipids and LFTs.

## 2024-03-28 NOTE — Assessment & Plan Note (Signed)
 No recurrence.  Reviewed EP notes.  Not requiring any medical therapy at present.  Continue metoprolol  succinate 25 mg daily.

## 2024-03-28 NOTE — Assessment & Plan Note (Signed)
 Most recent creatinine is 2.59.  Followed closely by his other medical providers.

## 2024-03-29 ENCOUNTER — Encounter: Payer: Self-pay | Admitting: Oncology

## 2024-03-30 ENCOUNTER — Encounter (HOSPITAL_COMMUNITY)

## 2024-03-31 ENCOUNTER — Encounter: Payer: Self-pay | Admitting: Pharmacist

## 2024-04-01 ENCOUNTER — Encounter (HOSPITAL_COMMUNITY)

## 2024-04-05 ENCOUNTER — Encounter (HOSPITAL_COMMUNITY): Payer: Self-pay

## 2024-04-05 ENCOUNTER — Telehealth (HOSPITAL_COMMUNITY): Payer: Self-pay

## 2024-04-05 NOTE — Telephone Encounter (Signed)
 Pt will need a new auth from the [place] for cardiac rehab if he would like to use his [facility] insurance. Left message for pt to call back if he is interested in the cardiac rehab program..   Sent letter

## 2024-04-11 ENCOUNTER — Telehealth (HOSPITAL_COMMUNITY): Payer: Self-pay

## 2024-04-11 NOTE — Telephone Encounter (Signed)
 Patient called back and stated that he wanted to do Cardiac Rehab.  Advised that referral was closed and that he would need to get new referral from [place] and have them fax it over to us .  Patient said that he would do that.

## 2024-04-19 ENCOUNTER — Telehealth (HOSPITAL_COMMUNITY): Payer: Self-pay

## 2024-04-19 NOTE — Telephone Encounter (Signed)
 F/u call regarding cardiac rehab. Patient is interested in returning, said he spoke to [place] last week regarding getting new authorization.

## 2024-04-25 ENCOUNTER — Encounter: Payer: Self-pay | Admitting: Pharmacist

## 2024-04-26 ENCOUNTER — Telehealth (HOSPITAL_COMMUNITY): Payer: Self-pay

## 2024-04-26 NOTE — Telephone Encounter (Signed)
 Received [facility] authorization for cardiac rehab for pt.   [id number]

## 2024-05-03 ENCOUNTER — Telehealth (HOSPITAL_COMMUNITY): Payer: Self-pay

## 2024-05-03 NOTE — Telephone Encounter (Signed)
 Attempted to call patient to schedule cardiac rehab- no answer, left message. Sent MyChart message.

## 2024-05-03 NOTE — Telephone Encounter (Signed)
 Patient called back to get scheduled in the Cardiac Rehab Program. Patient will come in for orientation on 1/06 and will attend the 10:15 exercise class.  Sent MyChart message.

## 2024-05-16 ENCOUNTER — Telehealth (HOSPITAL_COMMUNITY): Payer: Self-pay

## 2024-05-16 NOTE — Telephone Encounter (Signed)
 Confirmed Cardiac Rehab appt for 1/6. Completed nursing pre-assessment with pt. Instructions/directions provided. Pt asking about mental health options. Will talk to nurse navigator about information.  [id number] Returned call and information given for [facility] [phone]

## 2024-05-17 ENCOUNTER — Encounter: Payer: Self-pay | Admitting: Oncology

## 2024-05-17 ENCOUNTER — Encounter (HOSPITAL_COMMUNITY)
Admission: RE | Admit: 2024-05-17 | Discharge: 2024-05-17 | Disposition: A | Discharge status: Home / Self Care | Source: Ambulatory Visit | Attending: Cardiovascular Disease | Admitting: Cardiovascular Disease

## 2024-05-17 VITALS — BP 120/78 | HR 59 | Ht 72.0 in | Wt 217.4 lb

## 2024-05-17 DIAGNOSIS — Z5189 Encounter for other specified aftercare: Secondary | ICD-10-CM | POA: Diagnosis present

## 2024-05-17 DIAGNOSIS — I2111 ST elevation (STEMI) myocardial infarction involving right coronary artery: Secondary | ICD-10-CM

## 2024-05-17 DIAGNOSIS — Z955 Presence of coronary angioplasty implant and graft: Secondary | ICD-10-CM

## 2024-05-17 DIAGNOSIS — I252 Old myocardial infarction: Secondary | ICD-10-CM | POA: Diagnosis not present

## 2024-05-17 LAB — COMPREHENSIVE METABOLIC PANEL WITH GFR
ALT: 8 IU/L (ref 0–44)
AST: 14 IU/L (ref 0–40)
Albumin: 4.7 g/dL (ref 3.9–4.9)
Alkaline Phosphatase: 100 IU/L (ref 47–123)
BUN/Creatinine Ratio: 15 (ref 10–24)
BUN: 45 mg/dL — ABNORMAL HIGH (ref 8–27)
Bilirubin Total: 0.4 mg/dL (ref 0.0–1.2)
CO2: 18 mmol/L — ABNORMAL LOW (ref 20–29)
Calcium: 9 mg/dL (ref 8.6–10.2)
Chloride: 101 mmol/L (ref 96–106)
Creatinine, Ser: 2.99 mg/dL — ABNORMAL HIGH (ref 0.76–1.27)
Globulin, Total: 2.2 g/dL (ref 1.5–4.5)
Glucose: 232 mg/dL — ABNORMAL HIGH (ref 70–99)
Potassium: 4.5 mmol/L (ref 3.5–5.2)
Sodium: 134 mmol/L (ref 134–144)
Total Protein: 6.9 g/dL (ref 6.0–8.5)
eGFR: 22 mL/min/1.73 — ABNORMAL LOW

## 2024-05-17 LAB — LIPID PANEL
Chol/HDL Ratio: 4.6 ratio (ref 0.0–5.0)
Cholesterol, Total: 142 mg/dL (ref 100–199)
HDL: 31 mg/dL — ABNORMAL LOW
LDL Chol Calc (NIH): 60 mg/dL (ref 0–99)
Triglycerides: 328 mg/dL — ABNORMAL HIGH (ref 0–149)
VLDL Cholesterol Cal: 51 mg/dL — ABNORMAL HIGH (ref 5–40)

## 2024-05-17 LAB — GLUCOSE, CAPILLARY: Glucose-Capillary: 197 mg/dL — ABNORMAL HIGH (ref 70–99)

## 2024-05-17 NOTE — Progress Notes (Signed)
 Cardiac Rehab Medication Review   Does the patient  feel that his/her medications are working for him/her?  yes  Has the patient been experiencing any side effects to the medications prescribed?  yes  Does the patient measure his/her own blood pressure or blood glucose at home?  Checks blood pressure and blood sugar three times/week   Does the patient have any problems obtaining medications due to transportation or finances?   no  Understanding of regimen: excellent Understanding of indications: excellent Potential of compliance: excellent    Comments: Updated dosage of Alirocumab  and added Empagliflozin to list. Sildenafil discontinued.    Arnoldo Gal 05/17/2024 1:52 PM

## 2024-05-17 NOTE — Progress Notes (Signed)
 Cardiac Individual Treatment Plan  Patient Details  Name: Randall Frazier MRN: [id number] Date of Birth: [birth year] Referring Provider:   Flowsheet Row INTENSIVE CARDIAC REHAB ORIENT from 01/07/2024 in [facility]  Referring Provider Ozell Fell, MD    Initial Encounter Date:  Flowsheet Row INTENSIVE CARDIAC REHAB ORIENT from 01/07/2024 in [facility] for Heart, Vascular, & Lung Health  Date 01/07/24    Visit Diagnosis: 11/07/23 STEMI, DES RCA  11/07/23 DES RCA  Patient's Home Medications on Admission: Current Medications[1]  Past Medical History: Past Medical History:  Diagnosis Date   Arthritis    shoulder- both, neck, spine, GOUT   Diabetes mellitus    Gout    H/O exercise stress test    many yrs. 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 weight loss -over 18 mon. period     Tobacco Use: Tobacco Use History[2]  Labs: Review Flowsheet  More data exists      Latest Ref Rng & Units 12/15/2019 02/15/2021 03/23/2021 11/07/2023 11/08/2023  Labs for ITP Cardiac and Pulmonary Rehab  Cholestrol 0 - 200 mg/dL 876  - - 715  739   LDL (calc) 0 - 99 mg/dL 84  - - UNABLE TO CALCULATE IF TRIGLYCERIDE OVER 400 mg/dL  UNABLE TO CALCULATE IF TRIGLYCERIDE OVER 400 mg/dL   Direct LDL 0 - 99 mg/dL - - - 839  844   HDL-C >40 mg/dL 16  - - 28  24   Trlycerides <150 mg/dL 884  - - 291  477   Hemoglobin A1c 4.8 - 5.6 % 6.0  - - - 7.9   TCO2 22 - 32 mmol/L - 23  19  20   -    Capillary Blood Glucose: Lab Results  Component Value Date   GLUCAP 146 (H) 12/26/2023   GLUCAP 122 (H) 12/26/2023   GLUCAP 177 (H) 12/25/2023   GLUCAP 141 (H) 12/25/2023   GLUCAP 132 (H) 12/25/2023     Exercise Target Goals: Exercise Program Goal: Individual exercise  prescription set using results from initial 6 min walk test and THRR while considering  patients activity barriers and safety.   Exercise Prescription Goal: Initial exercise prescription builds to 30-45 minutes a day of aerobic activity, 2-3 days per week.  Home exercise guidelines will be given to patient during program as part of exercise prescription that the participant will acknowledge.  Activity Barriers & Risk Stratification:  Activity Barriers & Cardiac Risk Stratification - 05/17/24 1355       Activity Barriers & Cardiac Risk Stratification   Activity Barriers Back Problems;Balance Concerns;Assistive Device;Other (comment);Arthritis    Comments Gets gel shots in both knees, prior back/ cervical spine surgery-limited neck mobility, neuropathy in both feet.    Cardiac Risk Stratification High          6 Minute Walk:   Oxygen Initial Assessment:   Oxygen Re-Evaluation:   Oxygen Discharge (Final Oxygen Re-Evaluation):   Initial Exercise Prescription:   Perform Capillary Blood Glucose checks as needed.  Exercise Prescription Changes:   Exercise Comments:   Exercise Goals and Review:   Exercise Goals     Row Name 05/17/24 1357  Exercise Goals   Increase Physical Activity Yes       Intervention Provide advice, education, support and counseling about physical activity/exercise needs.;Develop an individualized exercise prescription for aerobic and resistive training based on initial evaluation findings, risk stratification, comorbidities and participant's personal goals.       Expected Outcomes Short Term: Attend rehab on a regular basis to increase amount of physical activity.;Long Term: Exercising regularly at least 3-5 days a week.;Long Term: Add in home exercise to make exercise part of routine and to increase amount of physical activity.       Increase Strength and Stamina Yes       Intervention Provide advice, education, support and counseling  about physical activity/exercise needs.;Develop an individualized exercise prescription for aerobic and resistive training based on initial evaluation findings, risk stratification, comorbidities and participant's personal goals.       Expected Outcomes Short Term: Increase workloads from initial exercise prescription for resistance, speed, and METs.;Short Term: Perform resistance training exercises routinely during rehab and add in resistance training at home;Long Term: Improve cardiorespiratory fitness, muscular endurance and strength as measured by increased METs and functional capacity ( )       Able to understand and use rate of perceived exertion (RPE) scale Yes       Intervention Provide education and explanation on how to use RPE scale       Expected Outcomes Short Term: Able to use RPE daily in rehab to express subjective intensity level;Long Term:  Able to use RPE to guide intensity level when exercising independently       Knowledge and understanding of Target Heart Rate Range (THRR) Yes       Intervention Provide education and explanation of THRR including how the numbers were predicted and where they are located for reference       Expected Outcomes Short Term: Able to state/look up THRR;Long Term: Able to use THRR to govern intensity when exercising independently;Short Term: Able to use daily as guideline for intensity in rehab       Understanding of Exercise Prescription Yes       Intervention Provide education, explanation, and written materials on patient's individual exercise prescription       Expected Outcomes Short Term: Able to explain program exercise prescription;Long Term: Able to explain home exercise prescription to exercise independently          Exercise Goals Re-Evaluation :   Discharge Exercise Prescription (Final Exercise Prescription Changes):   Nutrition:  Target Goals: Understanding of nutrition guidelines, daily intake of sodium 1500mg , cholesterol 200mg ,  calories 30% from fat and 7% or less from saturated fats, daily to have 5 or more servings of fruits and vegetables.  Biometrics:    Nutrition Therapy Plan and Nutrition Goals:   Nutrition Assessments:  MEDIFICTS Score Key: >=70 Need to make dietary changes  40-70 Heart Healthy Diet <= 40 Therapeutic Level Cholesterol Diet    Picture Your Plate Scores: <59 Unhealthy dietary pattern with much room for improvement. 41-50 Dietary pattern unlikely to meet recommendations for good health and room for improvement. 51-60 More healthful dietary pattern, with some room for improvement.  >60 Healthy dietary pattern, although there may be some specific behaviors that could be improved.    Nutrition Goals Re-Evaluation:   Nutrition Goals Re-Evaluation:   Nutrition Goals Discharge (Final Nutrition Goals Re-Evaluation):   Psychosocial: Target Goals: Acknowledge presence or absence of significant depression and/or stress, maximize coping skills, provide positive support system. Participant is able  to verbalize types and ability to use techniques and skills needed for reducing stress and depression.  Initial Review & Psychosocial Screening:  Initial Psych Review & Screening - 05/17/24 1358       Initial Review   Current issues with Current Depression;Current Anxiety/Panic;Current Stress Concerns    Source of Stress Concerns Family;Chronic Illness    Comments Father passed away last 2024-09-26.      Family Dynamics   Good Support System? Yes    Comments Brentlee has great support from his wife.      Barriers   Psychosocial barriers to participate in program The patient should benefit from training in stress management and relaxation.;Psychosocial barriers identified (see note)      Screening Interventions   Interventions Encouraged to exercise;Provide feedback about the scores to participant    Expected Outcomes Short Term goal: Utilizing psychosocial counselor, staff and physician to  assist with identification of specific Stressors or current issues interfering with healing process. Setting desired goal for each stressor or current issue identified.;Long Term Goal: Stressors or current issues are controlled or eliminated.;Short Term goal: Identification and review with participant of any Quality of Life or Depression concerns found by scoring the questionnaire.;Long Term goal: The participant improves quality of Life and PHQ9 Scores as seen by post scores and/or verbalization of changes          Quality of Life Scores:  Scores of 19 and below usually indicate a poorer quality of life in these areas.  A difference of  2-3 points is a clinically meaningful difference.  A difference of 2-3 points in the total score of the Quality of Life Index has been associated with significant improvement in overall quality of life, self-image, physical symptoms, and general health in studies assessing change in quality of life.  PHQ-9: Review Flowsheet       01/07/2024  Depression screen PHQ 2/9  Decreased Interest 1  Down, Depressed, Hopeless 1  PHQ - 2 Score 2  Altered sleeping 0  Tired, decreased energy 0  Change in appetite 0  Feeling bad or failure about yourself  1  Trouble concentrating 0  Moving slowly or fidgety/restless 0  Suicidal thoughts 0  PHQ-9 Score 3   Difficult doing work/chores Somewhat difficult    Details       Data saved with a previous flowsheet row definition        Interpretation of Total Score  Total Score Depression Severity:  1-4 = Minimal depression, 5-9 = Mild depression, 10-14 = Moderate depression, 15-19 = Moderately severe depression, 20-27 = Severe depression   Psychosocial Evaluation and Intervention:   Psychosocial Re-Evaluation:   Psychosocial Discharge (Final Psychosocial Re-Evaluation):   Vocational Rehabilitation: Provide vocational rehab assistance to qualifying candidates.   Vocational Rehab Evaluation &  Intervention:  Vocational Rehab - 05/17/24 1401       Initial Vocational Rehab Evaluation & Intervention   Assessment shows need for Vocational Rehabilitation No      Vocational Rehab Re-Evaulation   Comments Retired. No VR needs.          Education: Education Goals: Education classes will be provided on a weekly basis, covering required topics. Participant will state understanding/return demonstration of topics presented.     Core Videos: Exercise    Move It!  Clinical staff conducted group or individual video education with verbal and written material and guidebook.  Patient learns the recommended Pritikin exercise program. Exercise with the goal of living a long, healthy  life. Some of the health benefits of exercise include controlled diabetes, healthier blood pressure levels, improved cholesterol levels, improved heart and lung capacity, improved sleep, and better body composition. Everyone should speak with their doctor before starting or changing an exercise routine.  Biomechanical Limitations Clinical staff conducted group or individual video education with verbal and written material and guidebook.  Patient learns how biomechanical limitations can impact exercise and how we can mitigate and possibly overcome limitations to have an impactful and balanced exercise routine.  Body Composition Clinical staff conducted group or individual video education with verbal and written material and guidebook.  Patient learns that body composition (ratio of muscle mass to fat mass) is a key component to assessing overall fitness, rather than body weight alone. Increased fat mass, especially visceral belly fat, can put us  at increased risk for metabolic syndrome, type 2 diabetes, heart disease, and even death. It is recommended to combine diet and exercise (cardiovascular and resistance training) to improve your body composition. Seek guidance from your physician and exercise physiologist  before implementing an exercise routine.  Exercise Action Plan Clinical staff conducted group or individual video education with verbal and written material and guidebook.  Patient learns the recommended strategies to achieve and enjoy long-term exercise adherence, including variety, self-motivation, self-efficacy, and positive decision making. Benefits of exercise include fitness, good health, weight management, more energy, better sleep, less stress, and overall well-being.  Medical   Heart Disease Risk Reduction Clinical staff conducted group or individual video education with verbal and written material and guidebook.  Patient learns our heart is our most vital organ as it circulates oxygen, nutrients, white blood cells, and hormones throughout the entire body, and carries waste away. Data supports a plant-based eating plan like the Pritikin Program for its effectiveness in slowing progression of and reversing heart disease. The video provides a number of recommendations to address heart disease.   Metabolic Syndrome and Belly Fat  Clinical staff conducted group or individual video education with verbal and written material and guidebook.  Patient learns what metabolic syndrome is, how it leads to heart disease, and how one can reverse it and keep it from coming back. You have metabolic syndrome if you have 3 of the following 5 criteria: abdominal obesity, high blood pressure, high triglycerides, low HDL cholesterol, and high blood sugar.  Hypertension and Heart Disease Clinical staff conducted group or individual video education with verbal and written material and guidebook.  Patient learns that high blood pressure, or hypertension, is very common in the [place] . Hypertension is largely due to excessive salt intake, but other important risk factors include being overweight, physical inactivity, drinking too much alcohol , smoking, and not eating enough potassium from fruits and  vegetables. High blood pressure is a leading risk factor for heart attack, stroke, congestive heart failure, dementia, kidney failure, and premature death. Long-term effects of excessive salt intake include stiffening of the arteries and thickening of heart muscle and organ damage. Recommendations include ways to reduce hypertension and the risk of heart disease.  Diseases of Our Time - Focusing on Diabetes Clinical staff conducted group or individual video education with verbal and written material and guidebook.  Patient learns why the best way to stop diseases of our time is prevention, through food and other lifestyle changes. Medicine (such as prescription pills and surgeries) is often only a Band-Aid on the problem, not a long-term solution. Most common diseases of our time include obesity, type 2 diabetes, hypertension, heart disease,  and cancer. The Pritikin Program is recommended and has been proven to help reduce, reverse, and/or prevent the damaging effects of metabolic syndrome.  Nutrition   Overview of the Pritikin Eating Plan  Clinical staff conducted group or individual video education with verbal and written material and guidebook.  Patient learns about the Pritikin Eating Plan for disease risk reduction. The Pritikin Eating Plan emphasizes a wide variety of unrefined, minimally-processed carbohydrates, like fruits, vegetables, whole grains, and legumes. Go, Caution, and Stop food choices are explained. Plant-based and lean animal proteins are emphasized. Rationale provided for low sodium intake for blood pressure control, low added sugars for blood sugar stabilization, and low added fats and oils for coronary artery disease risk reduction and weight management.  Calorie Density  Clinical staff conducted group or individual video education with verbal and written material and guidebook.  Patient learns about calorie density and how it impacts the Pritikin Eating Plan. Knowing the  characteristics of the food you choose will help you decide whether those foods will lead to weight gain or weight loss, and whether you want to consume more or less of them. Weight loss is usually a side effect of the Pritikin Eating Plan because of its focus on low calorie-dense foods.  Label Reading  Clinical staff conducted group or individual video education with verbal and written material and guidebook.  Patient learns about the Pritikin recommended label reading guidelines and corresponding recommendations regarding calorie density, added sugars, sodium content, and whole grains.  Dining Out - Part 1  Clinical staff conducted group or individual video education with verbal and written material and guidebook.  Patient learns that restaurant meals can be sabotaging because they can be so high in calories, fat, sodium, and/or sugar. Patient learns recommended strategies on how to positively address this and avoid unhealthy pitfalls.  Facts on Fats  Clinical staff conducted group or individual video education with verbal and written material and guidebook.  Patient learns that lifestyle modifications can be just as effective, if not more so, as many medications for lowering your risk of heart disease. A Pritikin lifestyle can help to reduce your risk of inflammation and atherosclerosis (cholesterol build-up, or plaque, in the artery walls). Lifestyle interventions such as dietary choices and physical activity address the cause of atherosclerosis. A review of the types of fats and their impact on blood cholesterol levels, along with dietary recommendations to reduce fat intake is also included.  Nutrition Action Plan  Clinical staff conducted group or individual video education with verbal and written material and guidebook.  Patient learns how to incorporate Pritikin recommendations into their lifestyle. Recommendations include planning and keeping personal health goals in mind as an important  part of their success.  Healthy Mind-Set    Healthy Minds, Bodies, Hearts  Clinical staff conducted group or individual video education with verbal and written material and guidebook.  Patient learns how to identify when they are stressed. Video will discuss the impact of that stress, as well as the many benefits of stress management. Patient will also be introduced to stress management techniques. The way we think, act, and feel has an impact on our hearts.  How Our Thoughts Can Heal Our Hearts  Clinical staff conducted group or individual video education with verbal and written material and guidebook.  Patient learns that negative thoughts can cause depression and anxiety. This can result in negative lifestyle behavior and serious health problems. Cognitive behavioral therapy is an effective method to help control our  thoughts in order to change and improve our emotional outlook.  Additional Videos:  Exercise    Improving Performance  Clinical staff conducted group or individual video education with verbal and written material and guidebook.  Patient learns to use a non-linear approach by alternating intensity levels and lengths of time spent exercising to help burn more calories and lose more body fat. Cardiovascular exercise helps improve heart health, metabolism, hormonal balance, blood sugar control, and recovery from fatigue. Resistance training improves strength, endurance, balance, coordination, reaction time, metabolism, and muscle mass. Flexibility exercise improves circulation, posture, and balance. Seek guidance from your physician and exercise physiologist before implementing an exercise routine and learn your capabilities and proper form for all exercise.  Introduction to Yoga  Clinical staff conducted group or individual video education with verbal and written material and guidebook.  Patient learns about yoga, a discipline of the coming together of mind, breath, and body. The  benefits of yoga include improved flexibility, improved range of motion, better posture and core strength, increased lung function, weight loss, and positive self-image. Yogas heart health benefits include lowered blood pressure, healthier heart rate, decreased cholesterol and triglyceride levels, improved immune function, and reduced stress. Seek guidance from your physician and exercise physiologist before implementing an exercise routine and learn your capabilities and proper form for all exercise.  Medical   Aging: Enhancing Your Quality of Life  Clinical staff conducted group or individual video education with verbal and written material and guidebook.  Patient learns key strategies and recommendations to stay in good physical health and enhance quality of life, such as prevention strategies, having an advocate, securing a Health Care Proxy and Power of Attorney, and keeping a list of medications and system for tracking them. It also discusses how to avoid risk for bone loss.  Biology of Weight Control  Clinical staff conducted group or individual video education with verbal and written material and guidebook.  Patient learns that weight gain occurs because we consume more calories than we burn (eating more, moving less). Even if your body weight is normal, you may have higher ratios of fat compared to muscle mass. Too much body fat puts you at increased risk for cardiovascular disease, heart attack, stroke, type 2 diabetes, and obesity-related cancers. In addition to exercise, following the Pritikin Eating Plan can help reduce your risk.  Decoding Lab Results  Clinical staff conducted group or individual video education with verbal and written material and guidebook.  Patient learns that lab test reflects one measurement whose values change over time and are influenced by many factors, including medication, stress, sleep, exercise, food, hydration, pre-existing medical conditions, and more. It is  recommended to use the knowledge from this video to become more involved with your lab results and evaluate your numbers to speak with your doctor.   Diseases of Our Time - Overview  Clinical staff conducted group or individual video education with verbal and written material and guidebook.  Patient learns that according to the CDC, 50% to 70% of chronic diseases (such as obesity, type 2 diabetes, elevated lipids, hypertension, and heart disease) are avoidable through lifestyle improvements including healthier food choices, listening to satiety cues, and increased physical activity.  Sleep Disorders Clinical staff conducted group or individual video education with verbal and written material and guidebook.  Patient learns how good quality and duration of sleep are important to overall health and well-being. Patient also learns about sleep disorders and how they impact health along with recommendations to  address them, including discussing with a physician.  Nutrition  Dining Out - Part 2 Clinical staff conducted group or individual video education with verbal and written material and guidebook.  Patient learns how to plan ahead and communicate in order to maximize their dining experience in a healthy and nutritious manner. Included are recommended food choices based on the type of restaurant the patient is visiting.   Fueling a Banker conducted group or individual video education with verbal and written material and guidebook.  There is a strong connection between our food choices and our health. Diseases like obesity and type 2 diabetes are very prevalent and are in large-part due to lifestyle choices. The Pritikin Eating Plan provides plenty of food and hunger-curbing satisfaction. It is easy to follow, affordable, and helps reduce health risks.  Menu Workshop  Clinical staff conducted group or individual video education with verbal and written material and guidebook.   Patient learns that restaurant meals can sabotage health goals because they are often packed with calories, fat, sodium, and sugar. Recommendations include strategies to plan ahead and to communicate with the manager, chef, or server to help order a healthier meal.  Planning Your Eating Strategy  Clinical staff conducted group or individual video education with verbal and written material and guidebook.  Patient learns about the Pritikin Eating Plan and its benefit of reducing the risk of disease. The Pritikin Eating Plan does not focus on calories. Instead, it emphasizes high-quality, nutrient-rich foods. By knowing the characteristics of the foods, we choose, we can determine their calorie density and make informed decisions.  Targeting Your Nutrition Priorities  Clinical staff conducted group or individual video education with verbal and written material and guidebook.  Patient learns that lifestyle habits have a tremendous impact on disease risk and progression. This video provides eating and physical activity recommendations based on your personal health goals, such as reducing LDL cholesterol, losing weight, preventing or controlling type 2 diabetes, and reducing high blood pressure.  Vitamins and Minerals  Clinical staff conducted group or individual video education with verbal and written material and guidebook.  Patient learns different ways to obtain key vitamins and minerals, including through a recommended healthy diet. It is important to discuss all supplements you take with your doctor.   Healthy Mind-Set    Smoking Cessation  Clinical staff conducted group or individual video education with verbal and written material and guidebook.  Patient learns that cigarette smoking and tobacco addiction pose a serious health risk which affects millions of people. Stopping smoking will significantly reduce the risk of heart disease, lung disease, and many forms of cancer. Recommended  strategies for quitting are covered, including working with your doctor to develop a successful plan.  Culinary   Becoming a Set Designer conducted group or individual video education with verbal and written material and guidebook.  Patient learns that cooking at home can be healthy, cost-effective, quick, and puts them in control. Keys to cooking healthy recipes will include looking at your recipe, assessing your equipment needs, planning ahead, making it simple, choosing cost-effective seasonal ingredients, and limiting the use of added fats, salts, and sugars.  Cooking - Breakfast and Snacks  Clinical staff conducted group or individual video education with verbal and written material and guidebook.  Patient learns how important breakfast is to satiety and nutrition through the entire day. Recommendations include key foods to eat during breakfast to help stabilize blood sugar levels and to prevent  overeating at meals later in the day. Planning ahead is also a key component.  Cooking - Educational Psychologist conducted group or individual video education with verbal and written material and guidebook.  Patient learns eating strategies to improve overall health, including an approach to cook more at home. Recommendations include thinking of animal protein as a side on your plate rather than center stage and focusing instead on lower calorie dense options like vegetables, fruits, whole grains, and plant-based proteins, such as beans. Making sauces in large quantities to freeze for later and leaving the skin on your vegetables are also recommended to maximize your experience.  Cooking - Healthy Salads and Dressing Clinical staff conducted group or individual video education with verbal and written material and guidebook.  Patient learns that vegetables, fruits, whole grains, and legumes are the foundations of the Pritikin Eating Plan. Recommendations include how to  incorporate each of these in flavorful and healthy salads, and how to create homemade salad dressings. Proper handling of ingredients is also covered. Cooking - Soups and [employer] - Soups and Desserts Clinical staff conducted group or individual video education with verbal and written material and guidebook.  Patient learns that Pritikin soups and desserts make for easy, nutritious, and delicious snacks and meal components that are low in sodium, fat, sugar, and calorie density, while high in vitamins, minerals, and filling fiber. Recommendations include simple and healthy ideas for soups and desserts.   Overview     The Pritikin Solution Program Overview Clinical staff conducted group or individual video education with verbal and written material and guidebook.  Patient learns that the results of the Pritikin Program have been documented in more than 100 articles published in peer-reviewed journals, and the benefits include reducing risk factors for (and, in some cases, even reversing) high cholesterol, high blood pressure, type 2 diabetes, obesity, and more! An overview of the three key pillars of the Pritikin Program will be covered: eating well, doing regular exercise, and having a healthy mind-set.  WORKSHOPS  Exercise: Exercise Basics: Building Your Action Plan Clinical staff led group instruction and group discussion with PowerPoint presentation and patient guidebook. To enhance the learning environment the use of posters, models and videos may be added. At the conclusion of this workshop, patients will comprehend the difference between physical activity and exercise, as well as the benefits of incorporating both, into their routine. Patients will understand the FITT (Frequency, Intensity, Time, and Type) principle and how to use it to build an exercise action plan. In addition, safety concerns and other considerations for exercise and cardiac rehab will be addressed by the  presenter. The purpose of this lesson is to promote a comprehensive and effective weekly exercise routine in order to improve patients overall level of fitness.   Managing Heart Disease: Your Path to a Healthier Heart Clinical staff led group instruction and group discussion with PowerPoint presentation and patient guidebook. To enhance the learning environment the use of posters, models and videos may be added.At the conclusion of this workshop, patients will understand the anatomy and physiology of the heart. Additionally, they will understand how Pritikins three pillars impact the risk factors, the progression, and the management of heart disease.  The purpose of this lesson is to provide a high-level overview of the heart, heart disease, and how the Pritikin lifestyle positively impacts risk factors.  Exercise Biomechanics Clinical staff led group instruction and group discussion with PowerPoint presentation and patient guidebook. To enhance  the learning environment the use of posters, models and videos may be added. Patients will learn how the structural parts of their bodies function and how these functions impact their daily activities, movement, and exercise. Patients will learn how to promote a neutral spine, learn how to manage pain, and identify ways to improve their physical movement in order to promote healthy living. The purpose of this lesson is to expose patients to common physical limitations that impact physical activity. Participants will learn practical ways to adapt and manage aches and pains, and to minimize their effect on regular exercise. Patients will learn how to maintain good posture while sitting, walking, and lifting.  Balance Training and Fall Prevention  Clinical staff led group instruction and group discussion with PowerPoint presentation and patient guidebook. To enhance the learning environment the use of posters, models and videos may be added. At the  conclusion of this workshop, patients will understand the importance of their sensorimotor skills (vision, proprioception, and the vestibular system) in maintaining their ability to balance as they age. Patients will apply a variety of balancing exercises that are appropriate for their current level of function. Patients will understand the common causes for poor balance, possible solutions to these problems, and ways to modify their physical environment in order to minimize their fall risk. The purpose of this lesson is to teach patients about the importance of maintaining balance as they age and ways to minimize their risk of falling.  WORKSHOPS   Nutrition:  Fueling a Ship Broker led group instruction and group discussion with PowerPoint presentation and patient guidebook. To enhance the learning environment the use of posters, models and videos may be added. Patients will review the foundational principles of the Pritikin Eating Plan and understand what constitutes a serving size in each of the food groups. Patients will also learn Pritikin-friendly foods that are better choices when away from home and review make-ahead meal and snack options. Calorie density will be reviewed and applied to three nutrition priorities: weight maintenance, weight loss, and weight gain. The purpose of this lesson is to reinforce (in a group setting) the key concepts around what patients are recommended to eat and how to apply these guidelines when away from home by planning and selecting Pritikin-friendly options. Patients will understand how calorie density may be adjusted for different weight management goals.  Mindful Eating  Clinical staff led group instruction and group discussion with PowerPoint presentation and patient guidebook. To enhance the learning environment the use of posters, models and videos may be added. Patients will briefly review the concepts of the Pritikin Eating Plan and the  importance of low-calorie dense foods. The concept of mindful eating will be introduced as well as the importance of paying attention to internal hunger signals. Triggers for non-hunger eating and techniques for dealing with triggers will be explored. The purpose of this lesson is to provide patients with the opportunity to review the basic principles of the Pritikin Eating Plan, discuss the value of eating mindfully and how to measure internal cues of hunger and fullness using the Hunger Scale. Patients will also discuss reasons for non-hunger eating and learn strategies to use for controlling emotional eating.  Targeting Your Nutrition Priorities Clinical staff led group instruction and group discussion with PowerPoint presentation and patient guidebook. To enhance the learning environment the use of posters, models and videos may be added. Patients will learn how to determine their genetic susceptibility to disease by reviewing their family history. Patients  will gain insight into the importance of diet as part of an overall healthy lifestyle in mitigating the impact of genetics and other environmental insults. The purpose of this lesson is to provide patients with the opportunity to assess their personal nutrition priorities by looking at their family history, their own health history and current risk factors. Patients will also be able to discuss ways of prioritizing and modifying the Pritikin Eating Plan for their highest risk areas  Menu  Clinical staff led group instruction and group discussion with PowerPoint presentation and patient guidebook. To enhance the learning environment the use of posters, models and videos may be added. Using menus brought in from [employer], or printed from [employer], patients will apply the Pritikin dining out guidelines that were presented in the [employer] video. Patients will also be able to practice these guidelines in a variety of  provided scenarios. The purpose of this lesson is to provide patients with the opportunity to practice hands-on learning of the Pritikin Dining Out guidelines with actual menus and practice scenarios.  Label Reading Clinical staff led group instruction and group discussion with PowerPoint presentation and patient guidebook. To enhance the learning environment the use of posters, models and videos may be added. Patients will review and discuss the Pritikin label reading guidelines presented in Pritikins Label Reading Educational series video. Using fool labels brought in from local grocery stores and markets, patients will apply the label reading guidelines and determine if the packaged food meet the Pritikin guidelines. The purpose of this lesson is to provide patients with the opportunity to review, discuss, and practice hands-on learning of the Pritikin Label Reading guidelines with actual packaged food labels. Cooking School  Pritikins [employer] are designed to teach patients ways to prepare quick, simple, and affordable recipes at home. The importance of nutritions role in chronic disease risk reduction is reflected in its emphasis in the overall Pritikin program. By learning how to prepare essential core Pritikin Eating Plan recipes, patients will increase control over what they eat; be able to customize the flavor of foods without the use of added salt, sugar, or fat; and improve the quality of the food they consume. By learning a set of core recipes which are easily assembled, quickly prepared, and affordable, patients are more likely to prepare more healthy foods at home. These workshops focus on convenient breakfasts, simple entres, side dishes, and desserts which can be prepared with minimal effort and are consistent with nutrition recommendations for cardiovascular risk reduction. Cooking [employer] are taught by a armed forces logistics/support/administrative officer (RD) who has been trained by the  [employer]. The chef or RD has a clear understanding of the importance of minimizing - if not completely eliminating - added fat, sugar, and sodium in recipes. Throughout the series of Cooking School Workshop sessions, patients will learn about healthy ingredients and efficient methods of cooking to build confidence in their capability to prepare    Cooking School weekly topics:  Adding Flavor- Sodium-Free  Fast and Healthy Breakfasts  Powerhouse Plant-Based Proteins  Satisfying Salads and Dressings  Simple Sides and Sauces  International Cuisine-Spotlight on the [employer] Zones  Delicious Desserts  Savory Soups  [employer] - Meals in a Astronomer Appetizers and Snacks  Comforting Weekend Breakfasts  One-Pot Wonders   Fast Evening Meals  Landscape Architect Your Pritikin Plate  WORKSHOPS   Healthy Mindset (Psychosocial):  Focused Goals, Sustainable Changes Clinical staff  led group instruction and group discussion with PowerPoint presentation and patient guidebook. To enhance the learning environment the use of posters, models and videos may be added. Patients will be able to apply effective goal setting strategies to establish at least one personal goal, and then take consistent, meaningful action toward that goal. They will learn to identify common barriers to achieving personal goals and develop strategies to overcome them. Patients will also gain an understanding of how our mind-set can impact our ability to achieve goals and the importance of cultivating a positive and growth-oriented mind-set. The purpose of this lesson is to provide patients with a deeper understanding of how to set and achieve personal goals, as well as the tools and strategies needed to overcome common obstacles which may arise along the way.  From Head to Heart: The Power of a Healthy Outlook  Clinical staff led group instruction and group discussion with PowerPoint presentation  and patient guidebook. To enhance the learning environment the use of posters, models and videos may be added. Patients will be able to recognize and describe the impact of emotions and mood on physical health. They will discover the importance of self-care and explore self-care practices which may work for them. Patients will also learn how to utilize the 4 Cs to cultivate a healthier outlook and better manage stress and challenges. The purpose of this lesson is to demonstrate to patients how a healthy outlook is an essential part of maintaining good health, especially as they continue their cardiac rehab journey.  Healthy Sleep for a Healthy Heart Clinical staff led group instruction and group discussion with PowerPoint presentation and patient guidebook. To enhance the learning environment the use of posters, models and videos may be added. At the conclusion of this workshop, patients will be able to demonstrate knowledge of the importance of sleep to overall health, well-being, and quality of life. They will understand the symptoms of, and treatments for, common sleep disorders. Patients will also be able to identify daytime and nighttime behaviors which impact sleep, and they will be able to apply these tools to help manage sleep-related challenges. The purpose of this lesson is to provide patients with a general overview of sleep and outline the importance of quality sleep. Patients will learn about a few of the most common sleep disorders. Patients will also be introduced to the concept of sleep hygiene, and discover ways to self-manage certain sleeping problems through simple daily behavior changes. Finally, the workshop will motivate patients by clarifying the links between quality sleep and their goals of heart-healthy living.   Recognizing and Reducing Stress Clinical staff led group instruction and group discussion with PowerPoint presentation and patient guidebook. To enhance the learning  environment the use of posters, models and videos may be added. At the conclusion of this workshop, patients will be able to understand the types of stress reactions, differentiate between acute and chronic stress, and recognize the impact that chronic stress has on their health. They will also be able to apply different coping mechanisms, such as reframing negative self-talk. Patients will have the opportunity to practice a variety of stress management techniques, such as deep abdominal breathing, progressive muscle relaxation, and/or guided imagery.  The purpose of this lesson is to educate patients on the role of stress in their lives and to provide healthy techniques for coping with it.  Learning Barriers/Preferences:  Learning Barriers/Preferences - 05/17/24 1400       Learning Barriers/Preferences   Learning Barriers Hearing;Sight  Has hearing aids but doesn't have to use them much. Wears glasses for distance.   Learning Preferences Written Material;Skilled Demonstration;Computer/Internet          Education Topics:  Knowledge Questionnaire Score:   Core Components/Risk Factors/Patient Goals at Admission:  Personal Goals and Risk Factors at Admission - 05/17/24 1401       Core Components/Risk Factors/Patient Goals on Admission    Weight Management Yes;Weight Maintenance    Intervention Weight Management: Provide education and appropriate resources to help participant work on and attain dietary goals.;Weight Management: Develop a combined nutrition and exercise program designed to reach desired caloric intake, while maintaining appropriate intake of nutrient and fiber, sodium and fats, and appropriate energy expenditure required for the weight goal.    Expected Outcomes Short Term: Continue to assess and modify interventions until short term weight is achieved;Long Term: Adherence to nutrition and physical activity/exercise program aimed toward attainment of established weight  goal;Weight Maintenance: Understanding of the daily nutrition guidelines, which includes 25-35% calories from fat, 7% or less cal from saturated fats, less than 200mg  cholesterol, less than 1.5gm of sodium, & 5 or more servings of fruits and vegetables daily    Diabetes Yes    Intervention Provide education about signs/symptoms and action to take for hypo/hyperglycemia.;Provide education about proper nutrition, including hydration, and aerobic/resistive exercise prescription along with prescribed medications to achieve blood glucose in normal ranges: Fasting glucose 65-99 mg/dL    Expected Outcomes Short Term: Participant verbalizes understanding of the signs/symptoms and immediate care of hyper/hypoglycemia, proper foot care and importance of medication, aerobic/resistive exercise and nutrition plan for blood glucose control.;Long Term: Attainment of HbA1C < 7%.    Hypertension Yes    Intervention Provide education on lifestyle modifcations including regular physical activity/exercise, weight management, moderate sodium restriction and increased consumption of fresh fruit, vegetables, and low fat dairy, alcohol  moderation, and smoking cessation.;Monitor prescription use compliance.    Expected Outcomes Short Term: Continued assessment and intervention until BP is < 140/58mm HG in hypertensive participants. < 130/39mm HG in hypertensive participants with diabetes, heart failure or chronic kidney disease.;Long Term: Maintenance of blood pressure at goal levels.    Lipids Yes    Intervention Provide education and support for participant on nutrition & aerobic/resistive exercise along with prescribed medications to achieve LDL 70mg , HDL >40mg .    Expected Outcomes Short Term: Participant states understanding of desired cholesterol values and is compliant with medications prescribed. Participant is following exercise prescription and nutrition guidelines.;Long Term: Cholesterol controlled with medications as  prescribed, with individualized exercise RX and with personalized nutrition plan. Value goals: LDL < 70mg , HDL > 40 mg.    Stress Yes    Intervention Offer individual and/or small group education and counseling on adjustment to heart disease, stress management and health-related lifestyle change. Teach and support self-help strategies.;Refer participants experiencing significant psychosocial distress to appropriate mental health specialists for further evaluation and treatment. When possible, include family members and significant others in education/counseling sessions.    Expected Outcomes Short Term: Participant demonstrates changes in health-related behavior, relaxation and other stress management skills, ability to obtain effective social support, and compliance with psychotropic medications if prescribed.;Long Term: Emotional wellbeing is indicated by absence of clinically significant psychosocial distress or social isolation.          Core Components/Risk Factors/Patient Goals Review:    Core Components/Risk Factors/Patient Goals at Discharge (Final Review):    ITP Comments:  ITP Comments     Row Name 05/17/24  1320           ITP Comments Medical Director- Dr. Wilbert Bihari, MD. Introduction to the Pritikin Education/ Intensive Cardiac Rehab Program. Reviewed initial orientation folder with [facility].          Comments: Lizzie attended orientation for the cardiac rehabilitation program on  05/17/2024  to perform initial intake and exercise walk test. He was introduced to the [employer] education and orientation packet was reviewed. Completed 6-minute walk test, measurements, initial ITP, and exercise prescription. Vital signs stable. Telemetry-normal sinus rhythm, with occasional PVCs and rare bigeminy, mild shortness of breath during the , which resolved with rest.   Service time was from 1310 to 1507. Arnoldo CHRISTELLA Gal, MS, ACSM CEP 05/17/2024 1533        [1]  Current  Outpatient Medications:    Alirocumab  (PRALUENT ) 150 MG/ML SOAJ, Inject 1 mL (150 mg total) into the skin every 14 (fourteen) days. (Patient taking differently: Inject 75 mg into the skin every 14 (fourteen) days.), Disp: 2 mL, Rfl: 11   Artificial Tears ophthalmic solution, Place 1 drop into both eyes as needed for dry eyes., Disp: , Rfl:    aspirin  81 MG chewable tablet, Chew 81 mg by mouth daily., Disp: , Rfl:    coal tar (NEUTROGENA T-GEL) 0.5 % shampoo, Apply 1 Application topically at bedtime as needed (psorasis)., Disp: , Rfl:    doxazosin  (CARDURA ) 1 MG tablet, Take 1 tablet (1 mg total) by mouth daily., Disp: 90 tablet, Rfl: 3   empagliflozin (JARDIANCE) 25 MG TABS tablet, Take 25 mg by mouth daily., Disp: , Rfl:    ezetimibe  (ZETIA ) 10 MG tablet, Take 1 tablet (10 mg total) by mouth daily., Disp: 90 tablet, Rfl: 3   hydrocortisone  (CORTEF ) 10 MG tablet, Take 5-10 mg by mouth See admin instructions. Take 1 tablet (10mg ) by mouth in the morning and take 1/2 tablet (5mg ) at bedtime., Disp: , Rfl:    Isavuconazonium Sulfate  (CRESEMBA ) 186 MG CAPS, Take 372 mg by mouth daily., Disp: , Rfl:    lidocaine  4 %, Place 1 patch onto the skin daily as needed (pain)., Disp: , Rfl:    metoprolol  succinate (TOPROL -XL) 25 MG 24 hr tablet, Take 1 tablet (25 mg total) by mouth daily., Disp: 180 tablet, Rfl: 3   mycophenolate  (CELLCEPT ) 250 MG capsule, Take 1 capsule (250 mg total) by mouth 2 (two) times daily., Disp: , Rfl:    nitroGLYCERIN  (NITROSTAT ) 0.4 MG SL tablet, Place 1 tablet (0.4 mg total) under the tongue every 5 (five) minutes x 3 doses as needed for chest pain., Disp: 25 tablet, Rfl: 0   omeprazole (PRILOSEC) 40 MG capsule, Take 40 mg by mouth daily., Disp: , Rfl:    polyethylene glycol powder (GLYCOLAX /MIRALAX ) 17 GM/SCOOP powder, Take 17 g by mouth daily as needed for severe constipation., Disp: , Rfl:    sertraline  (ZOLOFT ) 100 MG tablet, Take 100 mg by mouth daily., Disp: , Rfl:    silver  sulfADIAZINE (SILVADENE) 1 % cream, Apply 1 Application topically daily., Disp: , Rfl:    ticagrelor  (BRILINTA ) 90 MG TABS tablet, Take 1 tablet (90 mg total) by mouth 2 (two) times daily., Disp: 180 tablet, Rfl: 3   triamcinolone cream (KENALOG) 0.1 %, Apply 1 Application topically as needed (rash arms)., Disp: , Rfl:    sildenafil (VIAGRA) 100 MG tablet, Take 100 mg by mouth daily as needed for erectile dysfunction. (Patient not taking: Reported on 05/17/2024), Disp: , Rfl:  [  2]  Social History Tobacco Use  Smoking Status Never  Smokeless Tobacco Never

## 2024-05-20 ENCOUNTER — Ambulatory Visit: Payer: Self-pay | Admitting: Cardiovascular Disease

## 2024-05-23 ENCOUNTER — Encounter (HOSPITAL_COMMUNITY)
Admission: RE | Admit: 2024-05-23 | Discharge: 2024-05-23 | Disposition: A | Source: Ambulatory Visit | Attending: Cardiovascular Disease

## 2024-05-23 DIAGNOSIS — Z955 Presence of coronary angioplasty implant and graft: Secondary | ICD-10-CM

## 2024-05-23 DIAGNOSIS — Z5189 Encounter for other specified aftercare: Secondary | ICD-10-CM | POA: Diagnosis not present

## 2024-05-23 DIAGNOSIS — I2111 ST elevation (STEMI) myocardial infarction involving right coronary artery: Secondary | ICD-10-CM

## 2024-05-23 LAB — GLUCOSE, CAPILLARY
Glucose-Capillary: 213 mg/dL — ABNORMAL HIGH (ref 70–99)
Glucose-Capillary: 188 mg/dL — ABNORMAL HIGH (ref 70–99)

## 2024-05-23 NOTE — Progress Notes (Signed)
 Daily Session Note  Patient Details  Name: CASSADY TURANO MRN: [id number] Date of Birth: [birth year] Referring Provider:   Flowsheet Row INTENSIVE CARDIAC REHAB ORIENT from 05/17/2024 in [facility]  Referring Provider Wonda Sharper, MD    Encounter Date: 05/23/2024  Check In:  Session Check In - 05/23/24 1036       Check-In   Supervising physician immediately available to respond to emergencies [facility] MD immediately available    Physician(s) Rosaline Bane NP    Location [facility]    Staff Present Fairy Music, RN, Jayson Quan, RN, Avonne Gal, MS, ACSM-CEP, Exercise Physiologist;David Janann, MS, ACSM-CEP, CCRP, Exercise Physiologist;Jetta Walker BS, ACSM-CEP, Exercise Physiologist    Virtual Visit No    Medication changes reported     No    Fall or balance concerns reported    No    Tobacco Cessation No Change    Warm-up and Cool-down Performed as group-led instruction    Resistance Training Performed Yes    VAD Patient? No    PAD/SET Patient? No      Pain Assessment   Currently in Pain? No/denies    Multiple Pain Sites No          Capillary Blood Glucose: Results for orders placed or performed during the hospital encounter of 05/23/24 (from the past 24 hours)  Glucose, capillary     Status: Abnormal   Collection Time: 05/23/24 10:25 AM  Result Value Ref Range   Glucose-Capillary 213 (H) 70 - 99 mg/dL  Glucose, capillary     Status: Abnormal   Collection Time: 05/23/24 11:22 AM  Result Value Ref Range   Glucose-Capillary 188 (H) 70 - 99 mg/dL     Exercise Prescription Changes - 05/23/24 1014       Response to Exercise   Blood Pressure (Admit) 120/64    Blood Pressure (Exercise) 142/66    Blood Pressure (Exit) 106/60    Heart Rate (Admit) 61 bpm    Heart Rate (Exercise) 96 bpm    Heart Rate (Exit) 65 bpm    Rating of Perceived Exertion (Exercise) 11    Symptoms  None    Comments Off to a good start with exercise.    Duration Continue with 30 min of aerobic exercise without signs/symptoms of physical distress.    Intensity THRR unchanged      Progression   Progression Continue to progress workloads to maintain intensity without signs/symptoms of physical distress.    Average METs 2.1      Resistance Training   Training Prescription Yes    Weight 3 lbs    Reps 10-15    Time 5 Minutes      Interval Training   Interval Training No      Recumbant Bike   Level 1    RPM 51    Watts 25    Minutes 15    METs 2.4      NuStep   Level 1    SPM 80    Minutes 15    METs 1.9          Tobacco Use History[1]  Goals Met:  Exercise tolerated well No report of concerns or symptoms today Strength training completed today  Goals Unmet:  Not Applicable  Comments: Pt started cardiac rehab today.  Pt tolerated light exercise without difficulty. VSS, telemetry-Sinus rhythm, asymptomatic.  Medication list reconciled. Pt denies barriers to medicaiton compliance.  PSYCHOSOCIAL ASSESSMENT:  PHQ-9=14. Pt exhibits positive coping skills, hopeful outlook with supportive family. No psychosocial needs identified at    Pt enjoys spending on the the computer.   Pt oriented to exercise equipment and routine.    Understanding verbalized. Hadassah Elpidio Quan RN BSN    Dr. Wilbert Bihari is Medical Director for Cardiac Rehab at [facility].     [1]  Social History Tobacco Use  Smoking Status Never  Smokeless Tobacco Never

## 2024-05-25 ENCOUNTER — Encounter (HOSPITAL_COMMUNITY)

## 2024-05-27 ENCOUNTER — Telehealth (HOSPITAL_COMMUNITY): Payer: Self-pay

## 2024-05-27 ENCOUNTER — Encounter (HOSPITAL_COMMUNITY)

## 2024-05-27 NOTE — Telephone Encounter (Signed)
 Patient c/o for 10:15 CR class, stated his wife has a dr appt and he needs to drive her.

## 2024-05-30 ENCOUNTER — Encounter (HOSPITAL_COMMUNITY)
Admission: RE | Admit: 2024-05-30 | Discharge: 2024-05-30 | Disposition: A | Source: Ambulatory Visit | Attending: Cardiovascular Disease

## 2024-05-30 DIAGNOSIS — Z5189 Encounter for other specified aftercare: Secondary | ICD-10-CM | POA: Diagnosis not present

## 2024-05-30 DIAGNOSIS — Z955 Presence of coronary angioplasty implant and graft: Secondary | ICD-10-CM

## 2024-05-30 DIAGNOSIS — I2111 ST elevation (STEMI) myocardial infarction involving right coronary artery: Secondary | ICD-10-CM

## 2024-05-30 LAB — GLUCOSE, CAPILLARY
Glucose-Capillary: 247 mg/dL — ABNORMAL HIGH (ref 70–99)
Glucose-Capillary: 276 mg/dL — ABNORMAL HIGH (ref 70–99)

## 2024-06-01 ENCOUNTER — Encounter (HOSPITAL_COMMUNITY)

## 2024-06-01 ENCOUNTER — Encounter (HOSPITAL_COMMUNITY): Admission: RE | Admit: 2024-06-01 | Discharge: 2024-06-01 | Attending: Cardiovascular Disease

## 2024-06-01 DIAGNOSIS — Z955 Presence of coronary angioplasty implant and graft: Secondary | ICD-10-CM

## 2024-06-01 DIAGNOSIS — I2111 ST elevation (STEMI) myocardial infarction involving right coronary artery: Secondary | ICD-10-CM

## 2024-06-01 DIAGNOSIS — Z5189 Encounter for other specified aftercare: Secondary | ICD-10-CM | POA: Diagnosis not present

## 2024-06-01 LAB — GLUCOSE, CAPILLARY: Glucose-Capillary: 159 mg/dL — ABNORMAL HIGH (ref 70–99)

## 2024-06-03 ENCOUNTER — Encounter (HOSPITAL_COMMUNITY)

## 2024-06-03 ENCOUNTER — Encounter (HOSPITAL_COMMUNITY)
Admission: RE | Admit: 2024-06-03 | Discharge: 2024-06-03 | Disposition: A | Source: Ambulatory Visit | Attending: Cardiovascular Disease

## 2024-06-03 DIAGNOSIS — Z5189 Encounter for other specified aftercare: Secondary | ICD-10-CM | POA: Diagnosis not present

## 2024-06-03 DIAGNOSIS — Z955 Presence of coronary angioplasty implant and graft: Secondary | ICD-10-CM

## 2024-06-03 DIAGNOSIS — I2111 ST elevation (STEMI) myocardial infarction involving right coronary artery: Secondary | ICD-10-CM

## 2024-06-06 ENCOUNTER — Encounter (HOSPITAL_COMMUNITY)

## 2024-06-07 NOTE — Progress Notes (Signed)
 Cardiac Individual Treatment Plan  Patient Details  Name: Randall Frazier MRN: [id number] Date of Birth: May 24, 1955 Referring Provider:   Flowsheet Row INTENSIVE CARDIAC REHAB ORIENT from 05/17/2024 in [facility], [facility], [facility]  Referring Provider Wonda Sharper, MD    Initial Encounter Date:  Flowsheet Row INTENSIVE CARDIAC REHAB ORIENT from 05/17/2024 in [facility] for Heart, Vascular, & Lung Health  Date 05/17/24    Visit Diagnosis: 11/07/23 STEMI, DES RCA  11/07/23 DES RCA  Patient's Home Medications on Admission: Current Medications[1]  Past Medical History: Past Medical History:  Diagnosis Date   Arthritis    shoulder- both, neck, spine, GOUT   Diabetes mellitus    Gout    H/O exercise stress test    many yrs. 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 weight loss -over 18 mon. period     Tobacco Use: Tobacco Use History[2]  Labs: Review Flowsheet  More data exists      Latest Ref Rng & Units 02/15/2021 03/23/2021 11/07/2023 11/08/2023 05/17/2024  Labs for ITP Cardiac and Pulmonary Rehab  Cholestrol 100 - 199 mg/dL - - 715  739  857   LDL (calc) 0 - 99 mg/dL - - UNABLE TO CALCULATE IF TRIGLYCERIDE OVER 400 mg/dL  UNABLE TO CALCULATE IF TRIGLYCERIDE OVER 400 mg/dL  60   Direct LDL 0 - 99 mg/dL - - 839  844  -  HDL-C >39 mg/dL - - 28  24  31    Trlycerides 0 - 149 mg/dL - - 291  477  671   Hemoglobin A1c 4.8 - 5.6 % - - - 7.9  -  TCO2 22 - 32 mmol/L 23  19  20   - -    Capillary Blood Glucose: Lab Results  Component Value Date   GLUCAP 159 (H) 06/01/2024   GLUCAP 276 (H) 05/30/2024   GLUCAP 247 (H) 05/30/2024   GLUCAP 188 (H) 05/23/2024   GLUCAP 213 (H) 05/23/2024     Exercise Target Goals: Exercise Program Goal: Individual exercise  prescription set using results from initial 6 min walk test and THRR while considering  patients activity barriers and safety.   Exercise Prescription Goal: Initial exercise prescription builds to 30-45 minutes a day of aerobic activity, 2-3 days per week.  Home exercise guidelines will be given to patient during program as part of exercise prescription that the participant will acknowledge.  Activity Barriers & Risk Stratification:  Activity Barriers & Cardiac Risk Stratification - 05/17/24 1355       Activity Barriers & Cardiac Risk Stratification   Activity Barriers Back Problems;Balance Concerns;Assistive Device;Other (comment);Arthritis    Comments Gets gel shots in both knees, prior back/ cervical spine surgery-limited neck mobility, neuropathy in both feet.    Cardiac Risk Stratification High          6 Minute Walk:  6 Minute Walk     Row Name 05/17/24 1525         6 Minute Walk   Phase Initial     Distance 1311 feet     Walk Time 6 minutes     # of Rest Breaks 0     MPH 2.48  METS 2.78     RPE 9     Perceived Dyspnea  1     VO2 Peak 9.74     Symptoms Yes (comment)     Comments Mild shortness of breath     Resting HR 59 bpm     Resting BP 120/78     Resting Oxygen Saturation  99 %     Exercise Oxygen Saturation  during 6 min walk 98 %     Max Ex. HR 77 bpm     Max Ex. BP 146/76     2 Minute Post BP 124/72        Oxygen Initial Assessment:   Oxygen Re-Evaluation:   Oxygen Discharge (Final Oxygen Re-Evaluation):   Initial Exercise Prescription:  Initial Exercise Prescription - 05/17/24 1500       Date of Initial Exercise RX and Referring Provider   Date 05/17/24    Referring Provider Wonda Sharper, MD    Expected Discharge Date 08/10/24      Recumbant Bike   Level 1    RPM 25    Watts 25    Minutes 15    METs 2.3      NuStep   Level 2    SPM 85    Minutes 15    METs 2.3      Prescription Details   Frequency (times per week) 3     Duration Progress to 30 minutes of continuous aerobic without signs/symptoms of physical distress      Intensity   THRR 40-80% of Max Heartrate 61-122    Ratings of Perceived Exertion 11-13    Perceived Dyspnea 0-4      Progression   Progression Continue to progress workloads to maintain intensity without signs/symptoms of physical distress.      Resistance Training   Training Prescription Yes    Weight 3 lbs    Reps 10-15          Perform Capillary Blood Glucose checks as needed.  Exercise Prescription Changes:   Exercise Prescription Changes     Row Name 05/23/24 1014             Response to Exercise   Blood Pressure (Admit) 120/64       Blood Pressure (Exercise) 142/66       Blood Pressure (Exit) 106/60       Heart Rate (Admit) 61 bpm       Heart Rate (Exercise) 96 bpm       Heart Rate (Exit) 65 bpm       Rating of Perceived Exertion (Exercise) 11       Symptoms None       Comments Off to a good start with exercise.       Duration Continue with 30 min of aerobic exercise without signs/symptoms of physical distress.       Intensity THRR unchanged         Progression   Progression Continue to progress workloads to maintain intensity without signs/symptoms of physical distress.       Average METs 2.1         Resistance Training   Training Prescription Yes       Weight 3 lbs       Reps 10-15       Time 5 Minutes         Interval Training   Interval Training No         Recumbant Bike   Level  1       RPM 51       Watts 25       Minutes 15       METs 2.4         NuStep   Level 1       SPM 80       Minutes 15       METs 1.9          Exercise Comments:   Exercise Comments     Row Name 05/23/24 1134           Exercise Comments Randall Frazier tolerated low intensity exercise well without symptoms. He was oriented to the exercise equipment and stretching routine.          Exercise Goals and Review:   Exercise Goals     Row Name 05/17/24 1357              Exercise Goals   Increase Physical Activity Yes       Intervention Provide advice, education, support and counseling about physical activity/exercise needs.;Develop an individualized exercise prescription for aerobic and resistive training based on initial evaluation findings, risk stratification, comorbidities and participant's personal goals.       Expected Outcomes Short Term: Attend rehab on a regular basis to increase amount of physical activity.;Long Term: Exercising regularly at least 3-5 days a week.;Long Term: Add in home exercise to make exercise part of routine and to increase amount of physical activity.       Increase Strength and Stamina Yes       Intervention Provide advice, education, support and counseling about physical activity/exercise needs.;Develop an individualized exercise prescription for aerobic and resistive training based on initial evaluation findings, risk stratification, comorbidities and participant's personal goals.       Expected Outcomes Short Term: Increase workloads from initial exercise prescription for resistance, speed, and METs.;Short Term: Perform resistance training exercises routinely during rehab and add in resistance training at home;Long Term: Improve cardiorespiratory fitness, muscular endurance and strength as measured by increased METs and functional capacity ( )       Able to understand and use rate of perceived exertion (RPE) scale Yes       Intervention Provide education and explanation on how to use RPE scale       Expected Outcomes Short Term: Able to use RPE daily in rehab to express subjective intensity level;Long Term:  Able to use RPE to guide intensity level when exercising independently       Knowledge and understanding of Target Heart Rate Range (THRR) Yes       Intervention Provide education and explanation of THRR including how the numbers were predicted and where they are located for reference       Expected Outcomes  Short Term: Able to state/look up THRR;Long Term: Able to use THRR to govern intensity when exercising independently;Short Term: Able to use daily as guideline for intensity in rehab       Understanding of Exercise Prescription Yes       Intervention Provide education, explanation, and written materials on patient's individual exercise prescription       Expected Outcomes Short Term: Able to explain program exercise prescription;Long Term: Able to explain home exercise prescription to exercise independently          Exercise Goals Re-Evaluation :  Exercise Goals Re-Evaluation     Row Name 05/23/24 1134  Exercise Goal Re-Evaluation   Exercise Goals Review Increase Physical Activity;Increase Strength and Stamina;Able to understand and use rate of perceived exertion (RPE) scale       Comments Randall Frazier is able to understand and use RPE scale appropriately.       Expected Outcomes Progress workloads as tolerated to help increase strength and stamina and lose weight.          Discharge Exercise Prescription (Final Exercise Prescription Changes):  Exercise Prescription Changes - 05/23/24 1014       Response to Exercise   Blood Pressure (Admit) 120/64    Blood Pressure (Exercise) 142/66    Blood Pressure (Exit) 106/60    Heart Rate (Admit) 61 bpm    Heart Rate (Exercise) 96 bpm    Heart Rate (Exit) 65 bpm    Rating of Perceived Exertion (Exercise) 11    Symptoms None    Comments Off to a good start with exercise.    Duration Continue with 30 min of aerobic exercise without signs/symptoms of physical distress.    Intensity THRR unchanged      Progression   Progression Continue to progress workloads to maintain intensity without signs/symptoms of physical distress.    Average METs 2.1      Resistance Training   Training Prescription Yes    Weight 3 lbs    Reps 10-15    Time 5 Minutes      Interval Training   Interval Training No      Recumbant Bike   Level 1     RPM 51    Watts 25    Minutes 15    METs 2.4      NuStep   Level 1    SPM 80    Minutes 15    METs 1.9          Nutrition:  Target Goals: Understanding of nutrition guidelines, daily intake of sodium 1500mg , cholesterol 200mg , calories 30% from fat and 7% or less from saturated fats, daily to have 5 or more servings of fruits and vegetables.  Biometrics:  Pre Biometrics - 05/17/24 1320       Pre Biometrics   Weight 98.6 kg    Waist Circumference 44.25 inches    Hip Circumference 42.75 inches    Waist to Hip Ratio 1.04 %    BMI (Calculated) 29.47    Triceps Skinfold 25 mm    % Body Fat 31.9 %    Grip Strength 21 kg    Flexibility --   Not performed. Hx back surgery.   Single Leg Stand 10.12 seconds           Nutrition Therapy Plan and Nutrition Goals:  Nutrition Therapy & Goals - 05/23/24 1128       Nutrition Therapy   Diet Heart Healthy, Diabetic      Personal Nutrition Goals   Nutrition Goal Patient to identify strategies for reducing cardiovascular risk by attending the Pritikin education and nutrition series weekly.    Personal Goal #2 Patient to improve diet quality by using the plate method as a guide for meal planning to include lean protein/plant protein, fruits, vegetables, whole grains, nonfat dairy as part of a well-balanced diet.    Personal Goal #3 Patient to identify strategies for weight loss with goal of 0.5-2 # per week of weight loss.    Personal Goal #4 Patient to identify strategies for blood sugar control with goal A1c <7%.    Comments Pt with  medical history significant for liver transplant in 2022, DM, CKD, HTN, CAD, prior CABG. Pt with acute inferior STEMI in June 2025 complicated by the presence of complete heart block.  He was found to have total occlusion of the distal RCA and was treated with PCI. Labs on 05/17/2024 significant for BUN 45, Cr 2.99, GFR 22, triglycerides 328. Most recent A1c of 7.9% on 11/08/23. Pt expresses desire to lose  weight with goal weight of 207 lb. Reports consuming lean sources of protein such as chicken and fish, mostly whole grains. Reports making significant changes to diet after liver transplant. Plans to attend cooking school classes with wife. Patient will benefit from participation in intensive cardiac rehab for nutrition education, exercise, and lifestyle modification.      Intervention Plan   Intervention Prescribe, educate and counsel regarding individualized specific dietary modifications aiming towards targeted core components such as weight, hypertension, lipid management, diabetes, heart failure and other comorbidities.    Expected Outcomes Short Term Goal: Understand basic principles of dietary content, such as calories, fat, sodium, cholesterol and nutrients.;Long Term Goal: Adherence to prescribed nutrition plan.          Nutrition Assessments:  MEDIFICTS Score Key: >=70 Need to make dietary changes  40-70 Heart Healthy Diet <= 40 Therapeutic Level Cholesterol Diet   Flowsheet Row INTENSIVE CARDIAC REHAB from 05/30/2024 in [facility] for Heart, Vascular, & Lung Health  Picture Your Plate Total Score on Admission 64   Picture Your Plate Scores: <59 Unhealthy dietary pattern with much room for improvement. 41-50 Dietary pattern unlikely to meet recommendations for good health and room for improvement. 51-60 More healthful dietary pattern, with some room for improvement.  >60 Healthy dietary pattern, although there may be some specific behaviors that could be improved.    Nutrition Goals Re-Evaluation:   Nutrition Goals Re-Evaluation:   Nutrition Goals Discharge (Final Nutrition Goals Re-Evaluation):   Psychosocial: Target Goals: Acknowledge presence or absence of significant depression and/or stress, maximize coping skills, provide positive support system. Participant is able to verbalize types and ability to use techniques and skills needed for  reducing stress and depression.  Initial Review & Psychosocial Screening:  Initial Psych Review & Screening - 05/17/24 1358       Initial Review   Current issues with Current Depression;Current Anxiety/Panic;Current Stress Concerns    Source of Stress Concerns Family;Chronic Illness    Comments Father passed away last Sep 25, 2024.      Family Dynamics   Good Support System? Yes    Comments Randall Frazier has great support from his wife.      Barriers   Psychosocial barriers to participate in program The patient should benefit from training in stress management and relaxation.;Psychosocial barriers identified (see note)      Screening Interventions   Interventions Encouraged to exercise;Provide feedback about the scores to participant    Expected Outcomes Short Term goal: Utilizing psychosocial counselor, staff and physician to assist with identification of specific Stressors or current issues interfering with healing process. Setting desired goal for each stressor or current issue identified.;Long Term Goal: Stressors or current issues are controlled or eliminated.;Short Term goal: Identification and review with participant of any Quality of Life or Depression concerns found by scoring the questionnaire.;Long Term goal: The participant improves quality of Life and PHQ9 Scores as seen by post scores and/or verbalization of changes          Quality of Life Scores:  Quality of Life - 05/17/24  1529       Quality of Life   Select Quality of Life      Quality of Life Scores   Health/Function Pre 11.33 %    Socioeconomic Pre 27.58 %    Psych/Spiritual Pre 19.18 %    Family Pre 27.88 %    GLOBAL Pre 18.16 %         Scores of 19 and below usually indicate a poorer quality of life in these areas.  A difference of  2-3 points is a clinically meaningful difference.  A difference of 2-3 points in the total score of the Quality of Life Index has been associated with significant improvement in overall  quality of life, self-image, physical symptoms, and general health in studies assessing change in quality of life.  PHQ-9: Review Flowsheet       05/17/2024 01/07/2024  Depression screen PHQ 2/9  Decreased Interest 2 1  Down, Depressed, Hopeless 2 1  PHQ - 2 Score 4 2  Altered sleeping 1 0  Tired, decreased energy 1 0  Change in appetite 1 0  Feeling bad or failure about yourself  3 1  Trouble concentrating 2 0  Moving slowly or fidgety/restless 2 0  Suicidal thoughts 0 0  PHQ-9 Score 14 3   Difficult doing work/chores Somewhat difficult Somewhat difficult    Details       Data saved with a previous flowsheet row definition        Interpretation of Total Score  Total Score Depression Severity:  1-4 = Minimal depression, 5-9 = Mild depression, 10-14 = Moderate depression, 15-19 = Moderately severe depression, 20-27 = Severe depression   Psychosocial Evaluation and Intervention:   Psychosocial Re-Evaluation:  Psychosocial Re-Evaluation     Row Name 05/25/24 1636 06/03/24 1445           Psychosocial Re-Evaluation   Current issues with Current Stress Concerns;Current Depression;Current Anxiety/Panic Current Stress Concerns;Current Depression;Current Anxiety/Panic      Comments Randall Frazier started cardiac rehab on 05/25/24. Randall Frazier did not voice any increased concerns or stressors on his first day of exericse. Will review PHQ9 in the upcoming future. Randall Frazier started cardiac rehab on 05/25/24. Randall Frazier has not voiced any increased concerns or stressors during exericse at cardiac rehab.      Expected Outcomes Randall Frazier will have controlled or decreased depression upon completion of cardiac rehab. Randall Frazier will have controlled or decreased depression upon completion of cardiac rehab.      Interventions Relaxation education;Encouraged to attend Cardiac Rehabilitation for the exercise;Stress management education Relaxation education;Encouraged to attend Cardiac Rehabilitation for the exercise;Stress management  education      Continue Psychosocial Services  Follow up required by staff Follow up required by staff        Initial Review   Source of Stress Concerns Chronic Illness;Family --      Comments Will continue to monitor and offer support as needed --         Psychosocial Discharge (Final Psychosocial Re-Evaluation):  Psychosocial Re-Evaluation - 06/03/24 1445       Psychosocial Re-Evaluation   Current issues with Current Stress Concerns;Current Depression;Current Anxiety/Panic    Comments Randall Frazier started cardiac rehab on 05/25/24. Randall Frazier has not voiced any increased concerns or stressors during exericse at cardiac rehab.    Expected Outcomes Randall Frazier will have controlled or decreased depression upon completion of cardiac rehab.    Interventions Relaxation education;Encouraged to attend Cardiac Rehabilitation for the exercise;Stress management education    Continue Psychosocial  Services  Follow up required by staff          Vocational Rehabilitation: Provide vocational rehab assistance to qualifying candidates.   Vocational Rehab Evaluation & Intervention:  Vocational Rehab - 05/17/24 1401       Initial Vocational Rehab Evaluation & Intervention   Assessment shows need for Vocational Rehabilitation No      Vocational Rehab Re-Evaulation   Comments Retired. No VR needs.          Education: Education Goals: Education classes will be provided on a weekly basis, covering required topics. Participant will state understanding/return demonstration of topics presented.    Education     Row Name 06/01/24 1000     Education   Cardiac Education Topics Pritikin   Orthoptist   Educator Dietitian   Weekly Topic Fast and Healthy Breakfasts   Instruction Review Code 1- Verbalizes Understanding   Class Start Time 0815   Class Stop Time [phone]   Class Time Calculation (min) 27 min      Core Videos: Exercise    Move It!  Clinical staff conducted group or  individual video education with verbal and written material and guidebook.  Patient learns the recommended Pritikin exercise program. Exercise with the goal of living a long, healthy life. Some of the health benefits of exercise include controlled diabetes, healthier blood pressure levels, improved cholesterol levels, improved heart and lung capacity, improved sleep, and better body composition. Everyone should speak with their doctor before starting or changing an exercise routine.  Biomechanical Limitations Clinical staff conducted group or individual video education with verbal and written material and guidebook.  Patient learns how biomechanical limitations can impact exercise and how we can mitigate and possibly overcome limitations to have an impactful and balanced exercise routine.  Body Composition Clinical staff conducted group or individual video education with verbal and written material and guidebook.  Patient learns that body composition (ratio of muscle mass to fat mass) is a key component to assessing overall fitness, rather than body weight alone. Increased fat mass, especially visceral belly fat, can put us  at increased risk for metabolic syndrome, type 2 diabetes, heart disease, and even death. It is recommended to combine diet and exercise (cardiovascular and resistance training) to improve your body composition. Seek guidance from your physician and exercise physiologist before implementing an exercise routine.  Exercise Action Plan Clinical staff conducted group or individual video education with verbal and written material and guidebook.  Patient learns the recommended strategies to achieve and enjoy long-term exercise adherence, including variety, self-motivation, self-efficacy, and positive decision making. Benefits of exercise include fitness, good health, weight management, more energy, better sleep, less stress, and overall well-being.  Medical   Heart Disease Risk  Reduction Clinical staff conducted group or individual video education with verbal and written material and guidebook.  Patient learns our heart is our most vital organ as it circulates oxygen, nutrients, white blood cells, and hormones throughout the entire body, and carries waste away. Data supports a plant-based eating plan like the Pritikin Program for its effectiveness in slowing progression of and reversing heart disease. The video provides a number of recommendations to address heart disease.   Metabolic Syndrome and Belly Fat  Clinical staff conducted group or individual video education with verbal and written material and guidebook.  Patient learns what metabolic syndrome is, how it leads to heart disease, and how one can reverse it and keep it from coming  back. You have metabolic syndrome if you have 3 of the following 5 criteria: abdominal obesity, high blood pressure, high triglycerides, low HDL cholesterol, and high blood sugar.  Hypertension and Heart Disease Clinical staff conducted group or individual video education with verbal and written material and guidebook.  Patient learns that high blood pressure, or hypertension, is very common in the [place] . Hypertension is largely due to excessive salt intake, but other important risk factors include being overweight, physical inactivity, drinking too much alcohol , smoking, and not eating enough potassium from fruits and vegetables. High blood pressure is a leading risk factor for heart attack, stroke, congestive heart failure, dementia, kidney failure, and premature death. Long-term effects of excessive salt intake include stiffening of the arteries and thickening of heart muscle and organ damage. Recommendations include ways to reduce hypertension and the risk of heart disease.  Diseases of Our Time - Focusing on Diabetes Clinical staff conducted group or individual video education with verbal and written material and guidebook.   Patient learns why the best way to stop diseases of our time is prevention, through food and other lifestyle changes. Medicine (such as prescription pills and surgeries) is often only a Band-Aid on the problem, not a long-term solution. Most common diseases of our time include obesity, type 2 diabetes, hypertension, heart disease, and cancer. The Pritikin Program is recommended and has been proven to help reduce, reverse, and/or prevent the damaging effects of metabolic syndrome.  Nutrition   Overview of the Pritikin Eating Plan  Clinical staff conducted group or individual video education with verbal and written material and guidebook.  Patient learns about the Pritikin Eating Plan for disease risk reduction. The Pritikin Eating Plan emphasizes a wide variety of unrefined, minimally-processed carbohydrates, like fruits, vegetables, whole grains, and legumes. Go, Caution, and Stop food choices are explained. Plant-based and lean animal proteins are emphasized. Rationale provided for low sodium intake for blood pressure control, low added sugars for blood sugar stabilization, and low added fats and oils for coronary artery disease risk reduction and weight management.  Calorie Density  Clinical staff conducted group or individual video education with verbal and written material and guidebook.  Patient learns about calorie density and how it impacts the Pritikin Eating Plan. Knowing the characteristics of the food you choose will help you decide whether those foods will lead to weight gain or weight loss, and whether you want to consume more or less of them. Weight loss is usually a side effect of the Pritikin Eating Plan because of its focus on low calorie-dense foods.  Label Reading  Clinical staff conducted group or individual video education with verbal and written material and guidebook.  Patient learns about the Pritikin recommended label reading guidelines and corresponding recommendations  regarding calorie density, added sugars, sodium content, and whole grains.  Dining Out - Part 1  Clinical staff conducted group or individual video education with verbal and written material and guidebook.  Patient learns that restaurant meals can be sabotaging because they can be so high in calories, fat, sodium, and/or sugar. Patient learns recommended strategies on how to positively address this and avoid unhealthy pitfalls.  Facts on Fats  Clinical staff conducted group or individual video education with verbal and written material and guidebook.  Patient learns that lifestyle modifications can be just as effective, if not more so, as many medications for lowering your risk of heart disease. A Pritikin lifestyle can help to reduce your risk of inflammation and atherosclerosis (cholesterol  build-up, or plaque, in the artery walls). Lifestyle interventions such as dietary choices and physical activity address the cause of atherosclerosis. A review of the types of fats and their impact on blood cholesterol levels, along with dietary recommendations to reduce fat intake is also included.  Nutrition Action Plan  Clinical staff conducted group or individual video education with verbal and written material and guidebook.  Patient learns how to incorporate Pritikin recommendations into their lifestyle. Recommendations include planning and keeping personal health goals in mind as an important part of their success.  Healthy Mind-Set    Healthy Minds, Bodies, Hearts  Clinical staff conducted group or individual video education with verbal and written material and guidebook.  Patient learns how to identify when they are stressed. Video will discuss the impact of that stress, as well as the many benefits of stress management. Patient will also be introduced to stress management techniques. The way we think, act, and feel has an impact on our hearts.  How Our Thoughts Can Heal Our Hearts  Clinical staff  conducted group or individual video education with verbal and written material and guidebook.  Patient learns that negative thoughts can cause depression and anxiety. This can result in negative lifestyle behavior and serious health problems. Cognitive behavioral therapy is an effective method to help control our thoughts in order to change and improve our emotional outlook.  Additional Videos:  Exercise    Improving Performance  Clinical staff conducted group or individual video education with verbal and written material and guidebook.  Patient learns to use a non-linear approach by alternating intensity levels and lengths of time spent exercising to help burn more calories and lose more body fat. Cardiovascular exercise helps improve heart health, metabolism, hormonal balance, blood sugar control, and recovery from fatigue. Resistance training improves strength, endurance, balance, coordination, reaction time, metabolism, and muscle mass. Flexibility exercise improves circulation, posture, and balance. Seek guidance from your physician and exercise physiologist before implementing an exercise routine and learn your capabilities and proper form for all exercise.  Introduction to Yoga  Clinical staff conducted group or individual video education with verbal and written material and guidebook.  Patient learns about yoga, a discipline of the coming together of mind, breath, and body. The benefits of yoga include improved flexibility, improved range of motion, better posture and core strength, increased lung function, weight loss, and positive self-image. Yogas heart health benefits include lowered blood pressure, healthier heart rate, decreased cholesterol and triglyceride levels, improved immune function, and reduced stress. Seek guidance from your physician and exercise physiologist before implementing an exercise routine and learn your capabilities and proper form for all exercise.  Medical   Aging:  Enhancing Your Quality of Life  Clinical staff conducted group or individual video education with verbal and written material and guidebook.  Patient learns key strategies and recommendations to stay in good physical health and enhance quality of life, such as prevention strategies, having an advocate, securing a Health Care Proxy and Power of Attorney, and keeping a list of medications and system for tracking them. It also discusses how to avoid risk for bone loss.  Biology of Weight Control  Clinical staff conducted group or individual video education with verbal and written material and guidebook.  Patient learns that weight gain occurs because we consume more calories than we burn (eating more, moving less). Even if your body weight is normal, you may have higher ratios of fat compared to muscle mass. Too much body fat puts  you at increased risk for cardiovascular disease, heart attack, stroke, type 2 diabetes, and obesity-related cancers. In addition to exercise, following the Pritikin Eating Plan can help reduce your risk.  Decoding Lab Results  Clinical staff conducted group or individual video education with verbal and written material and guidebook.  Patient learns that lab test reflects one measurement whose values change over time and are influenced by many factors, including medication, stress, sleep, exercise, food, hydration, pre-existing medical conditions, and more. It is recommended to use the knowledge from this video to become more involved with your lab results and evaluate your numbers to speak with your doctor.   Diseases of Our Time - Overview  Clinical staff conducted group or individual video education with verbal and written material and guidebook.  Patient learns that according to the CDC, 50% to 70% of chronic diseases (such as obesity, type 2 diabetes, elevated lipids, hypertension, and heart disease) are avoidable through lifestyle improvements including healthier food  choices, listening to satiety cues, and increased physical activity.  Sleep Disorders Clinical staff conducted group or individual video education with verbal and written material and guidebook.  Patient learns how good quality and duration of sleep are important to overall health and well-being. Patient also learns about sleep disorders and how they impact health along with recommendations to address them, including discussing with a physician.  Nutrition  Dining Out - Part 2 Clinical staff conducted group or individual video education with verbal and written material and guidebook.  Patient learns how to plan ahead and communicate in order to maximize their dining experience in a healthy and nutritious manner. Included are recommended food choices based on the type of restaurant the patient is visiting.   Fueling a Banker conducted group or individual video education with verbal and written material and guidebook.  There is a strong connection between our food choices and our health. Diseases like obesity and type 2 diabetes are very prevalent and are in large-part due to lifestyle choices. The Pritikin Eating Plan provides plenty of food and hunger-curbing satisfaction. It is easy to follow, affordable, and helps reduce health risks.  Menu Workshop  Clinical staff conducted group or individual video education with verbal and written material and guidebook.  Patient learns that restaurant meals can sabotage health goals because they are often packed with calories, fat, sodium, and sugar. Recommendations include strategies to plan ahead and to communicate with the manager, chef, or server to help order a healthier meal.  Planning Your Eating Strategy  Clinical staff conducted group or individual video education with verbal and written material and guidebook.  Patient learns about the Pritikin Eating Plan and its benefit of reducing the risk of disease. The Pritikin Eating  Plan does not focus on calories. Instead, it emphasizes high-quality, nutrient-rich foods. By knowing the characteristics of the foods, we choose, we can determine their calorie density and make informed decisions.  Targeting Your Nutrition Priorities  Clinical staff conducted group or individual video education with verbal and written material and guidebook.  Patient learns that lifestyle habits have a tremendous impact on disease risk and progression. This video provides eating and physical activity recommendations based on your personal health goals, such as reducing LDL cholesterol, losing weight, preventing or controlling type 2 diabetes, and reducing high blood pressure.  Vitamins and Minerals  Clinical staff conducted group or individual video education with verbal and written material and guidebook.  Patient learns different ways to obtain key vitamins  and minerals, including through a recommended healthy diet. It is important to discuss all supplements you take with your doctor.   Healthy Mind-Set    Smoking Cessation  Clinical staff conducted group or individual video education with verbal and written material and guidebook.  Patient learns that cigarette smoking and tobacco addiction pose a serious health risk which affects millions of people. Stopping smoking will significantly reduce the risk of heart disease, lung disease, and many forms of cancer. Recommended strategies for quitting are covered, including working with your doctor to develop a successful plan.  Culinary   Becoming a Set Designer conducted group or individual video education with verbal and written material and guidebook.  Patient learns that cooking at home can be healthy, cost-effective, quick, and puts them in control. Keys to cooking healthy recipes will include looking at your recipe, assessing your equipment needs, planning ahead, making it simple, choosing cost-effective seasonal ingredients,  and limiting the use of added fats, salts, and sugars.  Cooking - Breakfast and Snacks  Clinical staff conducted group or individual video education with verbal and written material and guidebook.  Patient learns how important breakfast is to satiety and nutrition through the entire day. Recommendations include key foods to eat during breakfast to help stabilize blood sugar levels and to prevent overeating at meals later in the day. Planning ahead is also a key component.  Cooking - Educational Psychologist conducted group or individual video education with verbal and written material and guidebook.  Patient learns eating strategies to improve overall health, including an approach to cook more at home. Recommendations include thinking of animal protein as a side on your plate rather than center stage and focusing instead on lower calorie dense options like vegetables, fruits, whole grains, and plant-based proteins, such as beans. Making sauces in large quantities to freeze for later and leaving the skin on your vegetables are also recommended to maximize your experience.  Cooking - Healthy Salads and Dressing Clinical staff conducted group or individual video education with verbal and written material and guidebook.  Patient learns that vegetables, fruits, whole grains, and legumes are the foundations of the Pritikin Eating Plan. Recommendations include how to incorporate each of these in flavorful and healthy salads, and how to create homemade salad dressings. Proper handling of ingredients is also covered. Cooking - Soups and [employer] - Soups and Desserts Clinical staff conducted group or individual video education with verbal and written material and guidebook.  Patient learns that Pritikin soups and desserts make for easy, nutritious, and delicious snacks and meal components that are low in sodium, fat, sugar, and calorie density, while high in vitamins, minerals, and filling  fiber. Recommendations include simple and healthy ideas for soups and desserts.   Overview     The Pritikin Solution Program Overview Clinical staff conducted group or individual video education with verbal and written material and guidebook.  Patient learns that the results of the Pritikin Program have been documented in more than 100 articles published in peer-reviewed journals, and the benefits include reducing risk factors for (and, in some cases, even reversing) high cholesterol, high blood pressure, type 2 diabetes, obesity, and more! An overview of the three key pillars of the Pritikin Program will be covered: eating well, doing regular exercise, and having a healthy mind-set.  WORKSHOPS  Exercise: Exercise Basics: Building Your Action Plan Clinical staff led group instruction and group discussion with PowerPoint presentation and patient guidebook.  To enhance the learning environment the use of posters, models and videos may be added. At the conclusion of this workshop, patients will comprehend the difference between physical activity and exercise, as well as the benefits of incorporating both, into their routine. Patients will understand the FITT (Frequency, Intensity, Time, and Type) principle and how to use it to build an exercise action plan. In addition, safety concerns and other considerations for exercise and cardiac rehab will be addressed by the presenter. The purpose of this lesson is to promote a comprehensive and effective weekly exercise routine in order to improve patients overall level of fitness.   Managing Heart Disease: Your Path to a Healthier Heart Clinical staff led group instruction and group discussion with PowerPoint presentation and patient guidebook. To enhance the learning environment the use of posters, models and videos may be added.At the conclusion of this workshop, patients will understand the anatomy and physiology of the heart. Additionally, they will  understand how Pritikins three pillars impact the risk factors, the progression, and the management of heart disease.  The purpose of this lesson is to provide a high-level overview of the heart, heart disease, and how the Pritikin lifestyle positively impacts risk factors.  Exercise Biomechanics Clinical staff led group instruction and group discussion with PowerPoint presentation and patient guidebook. To enhance the learning environment the use of posters, models and videos may be added. Patients will learn how the structural parts of their bodies function and how these functions impact their daily activities, movement, and exercise. Patients will learn how to promote a neutral spine, learn how to manage pain, and identify ways to improve their physical movement in order to promote healthy living. The purpose of this lesson is to expose patients to common physical limitations that impact physical activity. Participants will learn practical ways to adapt and manage aches and pains, and to minimize their effect on regular exercise. Patients will learn how to maintain good posture while sitting, walking, and lifting.  Balance Training and Fall Prevention  Clinical staff led group instruction and group discussion with PowerPoint presentation and patient guidebook. To enhance the learning environment the use of posters, models and videos may be added. At the conclusion of this workshop, patients will understand the importance of their sensorimotor skills (vision, proprioception, and the vestibular system) in maintaining their ability to balance as they age. Patients will apply a variety of balancing exercises that are appropriate for their current level of function. Patients will understand the common causes for poor balance, possible solutions to these problems, and ways to modify their physical environment in order to minimize their fall risk. The purpose of this lesson is to teach patients  about the importance of maintaining balance as they age and ways to minimize their risk of falling.  WORKSHOPS   Nutrition:  Fueling a Ship Broker led group instruction and group discussion with PowerPoint presentation and patient guidebook. To enhance the learning environment the use of posters, models and videos may be added. Patients will review the foundational principles of the Pritikin Eating Plan and understand what constitutes a serving size in each of the food groups. Patients will also learn Pritikin-friendly foods that are better choices when away from home and review make-ahead meal and snack options. Calorie density will be reviewed and applied to three nutrition priorities: weight maintenance, weight loss, and weight gain. The purpose of this lesson is to reinforce (in a group setting) the key concepts around what patients are recommended  to eat and how to apply these guidelines when away from home by planning and selecting Pritikin-friendly options. Patients will understand how calorie density may be adjusted for different weight management goals.  Mindful Eating  Clinical staff led group instruction and group discussion with PowerPoint presentation and patient guidebook. To enhance the learning environment the use of posters, models and videos may be added. Patients will briefly review the concepts of the Pritikin Eating Plan and the importance of low-calorie dense foods. The concept of mindful eating will be introduced as well as the importance of paying attention to internal hunger signals. Triggers for non-hunger eating and techniques for dealing with triggers will be explored. The purpose of this lesson is to provide patients with the opportunity to review the basic principles of the Pritikin Eating Plan, discuss the value of eating mindfully and how to measure internal cues of hunger and fullness using the Hunger Scale. Patients will also discuss reasons for non-hunger  eating and learn strategies to use for controlling emotional eating.  Targeting Your Nutrition Priorities Clinical staff led group instruction and group discussion with PowerPoint presentation and patient guidebook. To enhance the learning environment the use of posters, models and videos may be added. Patients will learn how to determine their genetic susceptibility to disease by reviewing their family history. Patients will gain insight into the importance of diet as part of an overall healthy lifestyle in mitigating the impact of genetics and other environmental insults. The purpose of this lesson is to provide patients with the opportunity to assess their personal nutrition priorities by looking at their family history, their own health history and current risk factors. Patients will also be able to discuss ways of prioritizing and modifying the Pritikin Eating Plan for their highest risk areas  Menu  Clinical staff led group instruction and group discussion with PowerPoint presentation and patient guidebook. To enhance the learning environment the use of posters, models and videos may be added. Using menus brought in from [employer], or printed from [employer], patients will apply the Pritikin dining out guidelines that were presented in the [employer] video. Patients will also be able to practice these guidelines in a variety of provided scenarios. The purpose of this lesson is to provide patients with the opportunity to practice hands-on learning of the Pritikin Dining Out guidelines with actual menus and practice scenarios.  Label Reading Clinical staff led group instruction and group discussion with PowerPoint presentation and patient guidebook. To enhance the learning environment the use of posters, models and videos may be added. Patients will review and discuss the Pritikin label reading guidelines presented in Pritikins Label Reading Educational series video.  Using fool labels brought in from local grocery stores and markets, patients will apply the label reading guidelines and determine if the packaged food meet the Pritikin guidelines. The purpose of this lesson is to provide patients with the opportunity to review, discuss, and practice hands-on learning of the Pritikin Label Reading guidelines with actual packaged food labels. Cooking School  Pritikins [employer] are designed to teach patients ways to prepare quick, simple, and affordable recipes at home. The importance of nutritions role in chronic disease risk reduction is reflected in its emphasis in the overall Pritikin program. By learning how to prepare essential core Pritikin Eating Plan recipes, patients will increase control over what they eat; be able to customize the flavor of foods without the use of added salt, sugar, or fat; and improve the  quality of the food they consume. By learning a set of core recipes which are easily assembled, quickly prepared, and affordable, patients are more likely to prepare more healthy foods at home. These workshops focus on convenient breakfasts, simple entres, side dishes, and desserts which can be prepared with minimal effort and are consistent with nutrition recommendations for cardiovascular risk reduction. Cooking [employer] are taught by a armed forces logistics/support/administrative officer (RD) who has been trained by the [employer]. The chef or RD has a clear understanding of the importance of minimizing - if not completely eliminating - added fat, sugar, and sodium in recipes. Throughout the series of Cooking School Workshop sessions, patients will learn about healthy ingredients and efficient methods of cooking to build confidence in their capability to prepare    Cooking School weekly topics:  Adding Flavor- Sodium-Free  Fast and Healthy Breakfasts  Powerhouse Plant-Based Proteins  Satisfying Salads and Dressings  Simple Sides and  Sauces  International Cuisine-Spotlight on the [employer] Zones  Delicious Desserts  Savory Soups  [employer] - Meals in a Astronomer Appetizers and Snacks  Comforting Weekend Breakfasts  One-Pot Wonders   Fast Evening Meals  Landscape Architect Your Pritikin Plate  WORKSHOPS   Healthy Mindset (Psychosocial):  Focused Goals, Sustainable Changes Clinical staff led group instruction and group discussion with PowerPoint presentation and patient guidebook. To enhance the learning environment the use of posters, models and videos may be added. Patients will be able to apply effective goal setting strategies to establish at least one personal goal, and then take consistent, meaningful action toward that goal. They will learn to identify common barriers to achieving personal goals and develop strategies to overcome them. Patients will also gain an understanding of how our mind-set can impact our ability to achieve goals and the importance of cultivating a positive and growth-oriented mind-set. The purpose of this lesson is to provide patients with a deeper understanding of how to set and achieve personal goals, as well as the tools and strategies needed to overcome common obstacles which may arise along the way.  From Head to Heart: The Power of a Healthy Outlook  Clinical staff led group instruction and group discussion with PowerPoint presentation and patient guidebook. To enhance the learning environment the use of posters, models and videos may be added. Patients will be able to recognize and describe the impact of emotions and mood on physical health. They will discover the importance of self-care and explore self-care practices which may work for them. Patients will also learn how to utilize the 4 Cs to cultivate a healthier outlook and better manage stress and challenges. The purpose of this lesson is to demonstrate to patients how a healthy outlook is an essential part of  maintaining good health, especially as they continue their cardiac rehab journey.  Healthy Sleep for a Healthy Heart Clinical staff led group instruction and group discussion with PowerPoint presentation and patient guidebook. To enhance the learning environment the use of posters, models and videos may be added. At the conclusion of this workshop, patients will be able to demonstrate knowledge of the importance of sleep to overall health, well-being, and quality of life. They will understand the symptoms of, and treatments for, common sleep disorders. Patients will also be able to identify daytime and nighttime behaviors which impact sleep, and they will be able to apply these tools to help manage sleep-related challenges. The purpose of this lesson is to provide patients  with a general overview of sleep and outline the importance of quality sleep. Patients will learn about a few of the most common sleep disorders. Patients will also be introduced to the concept of sleep hygiene, and discover ways to self-manage certain sleeping problems through simple daily behavior changes. Finally, the workshop will motivate patients by clarifying the links between quality sleep and their goals of heart-healthy living.   Recognizing and Reducing Stress Clinical staff led group instruction and group discussion with PowerPoint presentation and patient guidebook. To enhance the learning environment the use of posters, models and videos may be added. At the conclusion of this workshop, patients will be able to understand the types of stress reactions, differentiate between acute and chronic stress, and recognize the impact that chronic stress has on their health. They will also be able to apply different coping mechanisms, such as reframing negative self-talk. Patients will have the opportunity to practice a variety of stress management techniques, such as deep abdominal breathing, progressive muscle relaxation, and/or  guided imagery.  The purpose of this lesson is to educate patients on the role of stress in their lives and to provide healthy techniques for coping with it.  Learning Barriers/Preferences:  Learning Barriers/Preferences - 05/17/24 1400       Learning Barriers/Preferences   Learning Barriers Hearing;Sight   Has hearing aids but doesn't have to use them much. Wears glasses for distance.   Learning Preferences Written Material;Skilled Demonstration;Computer/Internet          Education Topics:  Knowledge Questionnaire Score:  Knowledge Questionnaire Score - 05/17/24 1408       Knowledge Questionnaire Score   Pre Score 23/24          Core Components/Risk Factors/Patient Goals at Admission:  Personal Goals and Risk Factors at Admission - 05/17/24 1401       Core Components/Risk Factors/Patient Goals on Admission    Weight Management Yes;Weight Loss    Intervention Weight Management: Provide education and appropriate resources to help participant work on and attain dietary goals.;Weight Management: Develop a combined nutrition and exercise program designed to reach desired caloric intake, while maintaining appropriate intake of nutrient and fiber, sodium and fats, and appropriate energy expenditure required for the weight goal.    Admit Weight 217 lb 4.8 oz (98.6 kg)    Goal Weight: Short Term 212 lb (96.2 kg)    Goal Weight: Long Term 207 lb (93.9 kg)    Expected Outcomes Short Term: Continue to assess and modify interventions until short term weight is achieved;Long Term: Adherence to nutrition and physical activity/exercise program aimed toward attainment of established weight goal;Weight Loss: Understanding of general recommendations for a balanced deficit meal plan, which promotes 1-2 lb weight loss per week and includes a negative energy balance of [phone] kcal/d    Diabetes Yes    Intervention Provide education about signs/symptoms and action to take for  hypo/hyperglycemia.;Provide education about proper nutrition, including hydration, and aerobic/resistive exercise prescription along with prescribed medications to achieve blood glucose in normal ranges: Fasting glucose 65-99 mg/dL    Expected Outcomes Short Term: Participant verbalizes understanding of the signs/symptoms and immediate care of hyper/hypoglycemia, proper foot care and importance of medication, aerobic/resistive exercise and nutrition plan for blood glucose control.;Long Term: Attainment of HbA1C < 7%.    Hypertension Yes    Intervention Provide education on lifestyle modifcations including regular physical activity/exercise, weight management, moderate sodium restriction and increased consumption of fresh fruit, vegetables, and low fat dairy, alcohol  moderation,  and smoking cessation.;Monitor prescription use compliance.    Expected Outcomes Short Term: Continued assessment and intervention until BP is < 140/39mm HG in hypertensive participants. < 130/52mm HG in hypertensive participants with diabetes, heart failure or chronic kidney disease.;Long Term: Maintenance of blood pressure at goal levels.    Lipids Yes    Intervention Provide education and support for participant on nutrition & aerobic/resistive exercise along with prescribed medications to achieve LDL 70mg , HDL >40mg .    Expected Outcomes Short Term: Participant states understanding of desired cholesterol values and is compliant with medications prescribed. Participant is following exercise prescription and nutrition guidelines.;Long Term: Cholesterol controlled with medications as prescribed, with individualized exercise RX and with personalized nutrition plan. Value goals: LDL < 70mg , HDL > 40 mg.    Stress Yes    Intervention Offer individual and/or small group education and counseling on adjustment to heart disease, stress management and health-related lifestyle change. Teach and support self-help strategies.;Refer  participants experiencing significant psychosocial distress to appropriate mental health specialists for further evaluation and treatment. When possible, include family members and significant others in education/counseling sessions.    Expected Outcomes Short Term: Participant demonstrates changes in health-related behavior, relaxation and other stress management skills, ability to obtain effective social support, and compliance with psychotropic medications if prescribed.;Long Term: Emotional wellbeing is indicated by absence of clinically significant psychosocial distress or social isolation.          Core Components/Risk Factors/Patient Goals Review:   Goals and Risk Factor Review     Row Name 05/25/24 1641 06/03/24 1446           Core Components/Risk Factors/Patient Goals Review   Personal Goals Review Weight Management/Obesity;Hypertension;Lipids;Stress;Diabetes Weight Management/Obesity;Hypertension;Lipids;Stress;Diabetes      Review Randall Frazier started cardiac rehab on 05/25/24. Randall Frazier did well with exercise. Vital signs and CBG's were stable. Randall Frazier started cardiac rehab on 05/25/24 and is off to a good start with exercise. Vital signs and CBG's have been stable.      Expected Outcomes Randall Frazier will continue to participate in cardiac rehab for exercise, nutriiton and lifstyle modifications Randall Frazier will continue to participate in cardiac rehab for exercise, nutrition and lifestyle modifications         Core Components/Risk Factors/Patient Goals at Discharge (Final Review):   Goals and Risk Factor Review - 06/03/24 1446       Core Components/Risk Factors/Patient Goals Review   Personal Goals Review Weight Management/Obesity;Hypertension;Lipids;Stress;Diabetes    Review Randall Frazier started cardiac rehab on 05/25/24 and is off to a good start with exercise. Vital signs and CBG's have been stable.    Expected Outcomes Randall Frazier will continue to participate in cardiac rehab for exercise, nutrition and lifestyle  modifications          ITP Comments:  ITP Comments     Row Name 05/17/24 1320 05/25/24 0936 06/03/24 1432       ITP Comments Medical Director- Dr. Wilbert Bihari, MD. Introduction to the [facility]. Reviewed initial orientation folder with [facility]. 30 Day ITP Review. Randall Frazier started cardiac rehab on 05/23/24. Randall Frazier did well with exercise. 30 Day ITP Review. Randall Frazier started cardiac rehab on 05/23/24 and is off to a good start with exercise.        Comments: see ITP comments    [1]  Current Outpatient Medications:    Alirocumab  (PRALUENT ) 150 MG/ML SOAJ, Inject 1 mL (150 mg total) into the skin every 14 (fourteen) days. (Patient taking differently: Inject 75 mg into the  skin every 14 (fourteen) days.), Disp: 2 mL, Rfl: 11   Artificial Tears ophthalmic solution, Place 1 drop into both eyes as needed for dry eyes., Disp: , Rfl:    aspirin  81 MG chewable tablet, Chew 81 mg by mouth daily., Disp: , Rfl:    coal tar (NEUTROGENA T-GEL) 0.5 % shampoo, Apply 1 Application topically at bedtime as needed (psorasis)., Disp: , Rfl:    doxazosin  (CARDURA ) 1 MG tablet, Take 1 tablet (1 mg total) by mouth daily., Disp: 90 tablet, Rfl: 3   empagliflozin (JARDIANCE) 25 MG TABS tablet, Take 25 mg by mouth daily., Disp: , Rfl:    ezetimibe  (ZETIA ) 10 MG tablet, Take 1 tablet (10 mg total) by mouth daily., Disp: 90 tablet, Rfl: 3   hydrocortisone  (CORTEF ) 10 MG tablet, Take 5-10 mg by mouth See admin instructions. Take 1 tablet (10mg ) by mouth in the morning and take 1/2 tablet (5mg ) at bedtime., Disp: , Rfl:    Isavuconazonium Sulfate  (CRESEMBA ) 186 MG CAPS, Take 372 mg by mouth daily., Disp: , Rfl:    lidocaine  4 %, Place 1 patch onto the skin daily as needed (pain)., Disp: , Rfl:    metoprolol  succinate (TOPROL -XL) 25 MG 24 hr tablet, Take 1 tablet (25 mg total) by mouth daily., Disp: 180 tablet, Rfl: 3   mycophenolate  (CELLCEPT ) 250 MG capsule, Take 1 capsule (250 mg total)  by mouth 2 (two) times daily., Disp: , Rfl:    nitroGLYCERIN  (NITROSTAT ) 0.4 MG SL tablet, Place 1 tablet (0.4 mg total) under the tongue every 5 (five) minutes x 3 doses as needed for chest pain., Disp: 25 tablet, Rfl: 0   omeprazole (PRILOSEC) 40 MG capsule, Take 40 mg by mouth daily., Disp: , Rfl:    polyethylene glycol powder (GLYCOLAX /MIRALAX ) 17 GM/SCOOP powder, Take 17 g by mouth daily as needed for severe constipation., Disp: , Rfl:    sertraline  (ZOLOFT ) 100 MG tablet, Take 100 mg by mouth daily., Disp: , Rfl:    sildenafil (VIAGRA) 100 MG tablet, Take 100 mg by mouth daily as needed for erectile dysfunction. (Patient not taking: Reported on 05/17/2024), Disp: , Rfl:    silver sulfADIAZINE (SILVADENE) 1 % cream, Apply 1 Application topically daily., Disp: , Rfl:    ticagrelor  (BRILINTA ) 90 MG TABS tablet, Take 1 tablet (90 mg total) by mouth 2 (two) times daily., Disp: 180 tablet, Rfl: 3   triamcinolone cream (KENALOG) 0.1 %, Apply 1 Application topically as needed (rash arms)., Disp: , Rfl:  [2]  Social History Tobacco Use  Smoking Status Never  Smokeless Tobacco Never

## 2024-06-08 ENCOUNTER — Encounter (HOSPITAL_COMMUNITY)
Admission: RE | Admit: 2024-06-08 | Discharge: 2024-06-08 | Disposition: A | Source: Ambulatory Visit | Attending: Cardiovascular Disease

## 2024-06-08 ENCOUNTER — Encounter (HOSPITAL_COMMUNITY)

## 2024-06-08 DIAGNOSIS — I2111 ST elevation (STEMI) myocardial infarction involving right coronary artery: Secondary | ICD-10-CM

## 2024-06-08 DIAGNOSIS — Z955 Presence of coronary angioplasty implant and graft: Secondary | ICD-10-CM

## 2024-06-08 DIAGNOSIS — Z5189 Encounter for other specified aftercare: Secondary | ICD-10-CM | POA: Diagnosis not present

## 2024-06-10 ENCOUNTER — Encounter (HOSPITAL_COMMUNITY)

## 2024-06-10 ENCOUNTER — Encounter (HOSPITAL_COMMUNITY)
Admission: RE | Admit: 2024-06-10 | Discharge: 2024-06-10 | Disposition: A | Source: Ambulatory Visit | Attending: Cardiovascular Disease

## 2024-06-10 DIAGNOSIS — Z955 Presence of coronary angioplasty implant and graft: Secondary | ICD-10-CM

## 2024-06-10 DIAGNOSIS — I2111 ST elevation (STEMI) myocardial infarction involving right coronary artery: Secondary | ICD-10-CM

## 2024-06-10 DIAGNOSIS — Z5189 Encounter for other specified aftercare: Secondary | ICD-10-CM | POA: Diagnosis not present

## 2024-06-13 ENCOUNTER — Encounter (HOSPITAL_COMMUNITY)

## 2024-06-15 ENCOUNTER — Encounter (HOSPITAL_COMMUNITY)

## 2024-06-15 ENCOUNTER — Encounter (HOSPITAL_COMMUNITY)
Admission: RE | Admit: 2024-06-15 | Discharge: 2024-06-15 | Disposition: A | Source: Ambulatory Visit | Attending: Cardiovascular Disease

## 2024-06-15 DIAGNOSIS — I2111 ST elevation (STEMI) myocardial infarction involving right coronary artery: Secondary | ICD-10-CM

## 2024-06-15 DIAGNOSIS — Z955 Presence of coronary angioplasty implant and graft: Secondary | ICD-10-CM

## 2024-06-17 ENCOUNTER — Encounter (HOSPITAL_COMMUNITY): Admission: RE | Admit: 2024-06-17 | Source: Ambulatory Visit

## 2024-06-17 ENCOUNTER — Encounter (HOSPITAL_COMMUNITY)

## 2024-06-17 DIAGNOSIS — Z955 Presence of coronary angioplasty implant and graft: Secondary | ICD-10-CM

## 2024-06-17 DIAGNOSIS — I2111 ST elevation (STEMI) myocardial infarction involving right coronary artery: Secondary | ICD-10-CM

## 2024-06-20 ENCOUNTER — Encounter (HOSPITAL_COMMUNITY)

## 2024-06-22 ENCOUNTER — Encounter (HOSPITAL_COMMUNITY)

## 2024-06-24 ENCOUNTER — Encounter (HOSPITAL_COMMUNITY)

## 2024-06-27 ENCOUNTER — Encounter (HOSPITAL_COMMUNITY)

## 2024-06-29 ENCOUNTER — Encounter (HOSPITAL_COMMUNITY)

## 2024-07-01 ENCOUNTER — Encounter (HOSPITAL_COMMUNITY)

## 2024-07-04 ENCOUNTER — Encounter (HOSPITAL_COMMUNITY)

## 2024-07-06 ENCOUNTER — Encounter (HOSPITAL_COMMUNITY)

## 2024-07-08 ENCOUNTER — Encounter (HOSPITAL_COMMUNITY)

## 2024-07-11 ENCOUNTER — Encounter (HOSPITAL_COMMUNITY)

## 2024-07-13 ENCOUNTER — Encounter (HOSPITAL_COMMUNITY)

## 2024-07-15 ENCOUNTER — Encounter (HOSPITAL_COMMUNITY)

## 2024-07-18 ENCOUNTER — Encounter (HOSPITAL_COMMUNITY)

## 2024-07-20 ENCOUNTER — Encounter (HOSPITAL_COMMUNITY)

## 2024-07-22 ENCOUNTER — Encounter (HOSPITAL_COMMUNITY)

## 2024-07-25 ENCOUNTER — Encounter (HOSPITAL_COMMUNITY)

## 2024-07-27 ENCOUNTER — Encounter (HOSPITAL_COMMUNITY)

## 2024-07-29 ENCOUNTER — Encounter (HOSPITAL_COMMUNITY)

## 2024-08-01 ENCOUNTER — Encounter (HOSPITAL_COMMUNITY)

## 2024-08-03 ENCOUNTER — Encounter (HOSPITAL_COMMUNITY)

## 2024-08-05 ENCOUNTER — Encounter (HOSPITAL_COMMUNITY)

## 2024-08-08 ENCOUNTER — Encounter (HOSPITAL_COMMUNITY)

## 2024-08-10 ENCOUNTER — Encounter (HOSPITAL_COMMUNITY)
